# Patient Record
Sex: Female | Born: 1950
Health system: Southern US, Community
[De-identification: ages and names within clinical notes are randomized; demographics above are authoritative.]

## PROBLEM LIST (undated history)

## (undated) DIAGNOSIS — M1990 Unspecified osteoarthritis, unspecified site: Secondary | ICD-10-CM

## (undated) DIAGNOSIS — I10 Essential (primary) hypertension: Secondary | ICD-10-CM

## (undated) DIAGNOSIS — T7840XA Allergy, unspecified, initial encounter: Secondary | ICD-10-CM

## (undated) DIAGNOSIS — K219 Gastro-esophageal reflux disease without esophagitis: Secondary | ICD-10-CM

## (undated) DIAGNOSIS — G43909 Migraine, unspecified, not intractable, without status migrainosus: Secondary | ICD-10-CM

## (undated) DIAGNOSIS — E785 Hyperlipidemia, unspecified: Secondary | ICD-10-CM

## (undated) DIAGNOSIS — M545 Low back pain, unspecified: Secondary | ICD-10-CM

## (undated) DIAGNOSIS — E119 Type 2 diabetes mellitus without complications: Secondary | ICD-10-CM

## (undated) HISTORY — DX: Allergy, unspecified, initial encounter: T78.40XA

## (undated) HISTORY — DX: Type 2 diabetes mellitus without complications: E11.9

## (undated) HISTORY — DX: Essential (primary) hypertension: I10

## (undated) HISTORY — DX: Unspecified osteoarthritis, unspecified site: M19.90

## (undated) HISTORY — PX: NO PAST SURGERIES: SHX2092

## (undated) HISTORY — DX: Low back pain: M54.5

## (undated) HISTORY — DX: Migraine, unspecified, not intractable, without status migrainosus: G43.909

## (undated) HISTORY — DX: Low back pain, unspecified: M54.50

## (undated) HISTORY — DX: Hyperlipidemia, unspecified: E78.5

---

## 2009-05-26 LAB — HM MAMMOGRAPHY

## 2010-05-08 LAB — HM PAP SMEAR

## 2011-09-12 ENCOUNTER — Emergency Department: Payer: Self-pay | Admitting: *Deleted

## 2011-09-12 LAB — URINALYSIS, COMPLETE
Ketone: NEGATIVE
Nitrite: NEGATIVE
Protein: NEGATIVE
RBC,UR: 1 /HPF (ref 0–5)
WBC UR: 3 /HPF (ref 0–5)

## 2011-09-12 LAB — COMPREHENSIVE METABOLIC PANEL
Alkaline Phosphatase: 70 U/L (ref 50–136)
Anion Gap: 10 (ref 7–16)
BUN: 24 mg/dL — ABNORMAL HIGH (ref 7–18)
Bilirubin,Total: 0.4 mg/dL (ref 0.2–1.0)
Calcium, Total: 9.3 mg/dL (ref 8.5–10.1)
Chloride: 98 mmol/L (ref 98–107)
EGFR (African American): >60
EGFR (Non-African Amer.): >60
Osmolality: 282 (ref 275–301)
Potassium: 3.4 mmol/L — ABNORMAL LOW (ref 3.5–5.1)
SGOT(AST): 39 U/L — ABNORMAL HIGH (ref 15–37)
Sodium: 137 mmol/L (ref 136–145)
Total Protein: 7.9 g/dL (ref 6.4–8.2)

## 2011-09-12 LAB — CBC
HCT: 42.3 % (ref 35.0–47.0)
HGB: 14.9 g/dL (ref 12.0–16.0)
MCH: 30 pg (ref 26.0–34.0)
MCHC: 35.2 g/dL (ref 32.0–36.0)
RDW: 12.7 % (ref 11.5–14.5)

## 2011-09-12 LAB — LIPASE, BLOOD: Lipase: 286 U/L (ref 73–393)

## 2012-07-07 ENCOUNTER — Ambulatory Visit: Payer: Self-pay

## 2012-12-03 ENCOUNTER — Ambulatory Visit: Payer: Self-pay | Admitting: Family Medicine

## 2014-03-25 ENCOUNTER — Ambulatory Visit: Payer: Self-pay

## 2014-06-09 ENCOUNTER — Emergency Department
Admit: 2014-06-09 | Disposition: A | Discharge status: Home / Self Care | Payer: Self-pay | Admitting: Emergency Medicine

## 2014-06-09 LAB — CBC WITH DIFFERENTIAL/PLATELET
Basophil #: 0.1 10*3/uL (ref 0.0–0.1)
Basophil %: 0.6 %
EOS PCT: 1.1 %
Eosinophil #: 0.1 10*3/uL (ref 0.0–0.7)
HCT: 45.4 % (ref 35.0–47.0)
HGB: 15.2 g/dL (ref 12.0–16.0)
Lymphocyte #: 2.8 10*3/uL (ref 1.0–3.6)
Lymphocyte %: 28.3 %
MCH: 29.6 pg (ref 26.0–34.0)
MCHC: 33.6 g/dL (ref 32.0–36.0)
MCV: 88 fL (ref 80–100)
MONOS PCT: 5 %
Monocyte #: 0.5 x10 3/mm (ref 0.2–0.9)
Neutrophil #: 6.3 10*3/uL (ref 1.4–6.5)
Neutrophil %: 65 %
Platelet: 320 10*3/uL (ref 150–440)
RBC: 5.14 10*6/uL (ref 3.80–5.20)
RDW: 12.6 % (ref 11.5–14.5)
WBC: 9.8 10*3/uL (ref 3.6–11.0)

## 2014-06-09 LAB — LIPASE, BLOOD: Lipase: 35 U/L

## 2014-06-09 LAB — COMPREHENSIVE METABOLIC PANEL
ALK PHOS: 52 U/L
ALT: 103 U/L — AB
Albumin: 4.5 g/dL
Anion Gap: 9 (ref 7–16)
BILIRUBIN TOTAL: 0.6 mg/dL
BUN: 19 mg/dL
CO2: 32 mmol/L
Calcium, Total: 9.6 mg/dL
Chloride: 95 mmol/L — ABNORMAL LOW
Creatinine: 0.73 mg/dL
EGFR (African American): >60
EGFR (Non-African Amer.): >60
Glucose: 269 mg/dL — ABNORMAL HIGH
POTASSIUM: 3.3 mmol/L — AB
SGOT(AST): 105 U/L — ABNORMAL HIGH
Sodium: 136 mmol/L
TOTAL PROTEIN: 8.4 g/dL — AB

## 2014-06-09 LAB — TROPONIN I: Troponin-I: <0.03 ng/mL

## 2014-06-09 LAB — URINALYSIS, COMPLETE
BACTERIA: NONE SEEN
BILIRUBIN, UR: NEGATIVE
Blood: NEGATIVE
Ketone: NEGATIVE
NITRITE: NEGATIVE
Ph: 5 (ref 4.5–8.0)
Protein: NEGATIVE
Specific Gravity: 1.028 (ref 1.003–1.030)

## 2014-09-07 ENCOUNTER — Telehealth: Payer: Self-pay | Admitting: Unknown Physician Specialty

## 2014-09-07 MED ORDER — CANAGLIFLOZIN 100 MG PO TABS: 100.0000 mg | ORAL_TABLET | Freq: Every day | ORAL | Status: DC

## 2014-09-07 NOTE — Telephone Encounter (Signed)
Routing to provider. [facility] number is [phone] and [facility] [place].

## 2014-09-07 NOTE — Telephone Encounter (Signed)
Pt needs to be seen further refills

## 2014-09-07 NOTE — Telephone Encounter (Signed)
E-fax came through for refill: Rx: Invokana Copy in basket

## 2014-11-01 ENCOUNTER — Telehealth: Payer: Self-pay | Admitting: Unknown Physician Specialty

## 2014-11-01 DIAGNOSIS — M545 Low back pain, unspecified: Secondary | ICD-10-CM | POA: Insufficient documentation

## 2014-11-01 DIAGNOSIS — R748 Abnormal levels of other serum enzymes: Secondary | ICD-10-CM | POA: Insufficient documentation

## 2014-11-01 DIAGNOSIS — M1990 Unspecified osteoarthritis, unspecified site: Secondary | ICD-10-CM

## 2014-11-01 DIAGNOSIS — E1159 Type 2 diabetes mellitus with other circulatory complications: Secondary | ICD-10-CM | POA: Insufficient documentation

## 2014-11-01 DIAGNOSIS — E119 Type 2 diabetes mellitus without complications: Secondary | ICD-10-CM

## 2014-11-01 DIAGNOSIS — J309 Allergic rhinitis, unspecified: Secondary | ICD-10-CM

## 2014-11-01 DIAGNOSIS — E1165 Type 2 diabetes mellitus with hyperglycemia: Secondary | ICD-10-CM | POA: Insufficient documentation

## 2014-11-01 DIAGNOSIS — E1169 Type 2 diabetes mellitus with other specified complication: Secondary | ICD-10-CM | POA: Insufficient documentation

## 2014-11-01 DIAGNOSIS — E785 Hyperlipidemia, unspecified: Secondary | ICD-10-CM

## 2014-11-01 DIAGNOSIS — I10 Essential (primary) hypertension: Secondary | ICD-10-CM

## 2014-11-01 NOTE — Telephone Encounter (Signed)
I was looking back at practice partner and metformin is not on the patient's current medication list. It is actually on the allergy list with a high risk so I tried to call the patient to ask her about this. Her mother answered the phone and I asked her to have the patient give me a call back as soon as she could.

## 2014-11-01 NOTE — Telephone Encounter (Signed)
Pt called stated she can not take Invokana. Pt stated she has started back on Metformin and needs a RX for this medication to last until her appt on Tuesday with Malachy Mood. [facility] [place]. Thanks.

## 2014-11-02 NOTE — Telephone Encounter (Signed)
Patient returned call and I asked her about being allergic to the metformin. Patient stated she was never allergic to it and that Malachy Mood took her off of it and switched her to the [place]. Patient states that the invokana messes with her kidneys so she started taking what was left of the metformin she had. Patient is requesting just enough medicine to get her to her appointment with Malachy Mood on Tuesday. Practice partner number is [phone] and [facility] [place].

## 2014-11-03 NOTE — Telephone Encounter (Signed)
I reviewed Cheryl Mendez's note from 06/15/2014; noted abdominal upset, intolerance to metformin I am not comfortable prescribing this and recommend patient talk to Malachy Mood about this medicine or other options at her appointment on Tuesday No new prescriptions today

## 2014-11-03 NOTE — Telephone Encounter (Signed)
Patient returned our call regarding her Metformin request. Advised patient as noted below.  Patient states that she is currently Patient was advised  states that she only needs a few Metformin pills to carry her through until she has her appt with Malachy Mood on Tuesday, 9/17.

## 2014-11-04 LAB — HM DIABETES EYE EXAM

## 2014-11-04 NOTE — Telephone Encounter (Signed)
I asked Cheryl Mendez about this message and she stated that the patient is wanting to know if she will be okay to go without the Metformin for a few days until her appointment. Will she be okay to go without it for a few days?

## 2014-11-04 NOTE — Telephone Encounter (Signed)
Yes, that was my intention; no metformin until she sees Malachy Mood next week

## 2014-11-04 NOTE — Telephone Encounter (Signed)
Called and left patient a voicemail asking for her to return my call. 

## 2014-11-08 ENCOUNTER — Encounter: Payer: Self-pay | Admitting: Unknown Physician Specialty

## 2014-11-08 ENCOUNTER — Telehealth: Payer: Self-pay | Admitting: Unknown Physician Specialty

## 2014-11-08 ENCOUNTER — Ambulatory Visit (INDEPENDENT_AMBULATORY_CARE_PROVIDER_SITE_OTHER): Payer: BLUE CROSS/BLUE SHIELD | Admitting: Unknown Physician Specialty

## 2014-11-08 VITALS — BP 130/81 | HR 80 | Temp 98.6°F | Ht 64.6 in | Wt 185.4 lb

## 2014-11-08 DIAGNOSIS — R748 Abnormal levels of other serum enzymes: Secondary | ICD-10-CM | POA: Diagnosis not present

## 2014-11-08 DIAGNOSIS — E785 Hyperlipidemia, unspecified: Secondary | ICD-10-CM

## 2014-11-08 DIAGNOSIS — I10 Essential (primary) hypertension: Secondary | ICD-10-CM

## 2014-11-08 DIAGNOSIS — E119 Type 2 diabetes mellitus without complications: Secondary | ICD-10-CM | POA: Diagnosis not present

## 2014-11-08 LAB — LIPID PANEL PICCOLO, WAIVED
CHOLESTEROL PICCOLO, WAIVED: 241 mg/dL — AB (ref <200)
Chol/HDL Ratio Piccolo,Waive: 5.7 mg/dL — ABNORMAL HIGH
HDL CHOL PICCOLO, WAIVED: 42 mg/dL — AB (ref >59)
LDL Chol Calc Piccolo Waived: 145 mg/dL — ABNORMAL HIGH (ref <100)
Triglycerides Piccolo,Waived: 269 mg/dL — ABNORMAL HIGH (ref <150)
VLDL Chol Calc Piccolo,Waive: 54 mg/dL — ABNORMAL HIGH (ref <30)

## 2014-11-08 LAB — MICROALBUMIN, URINE WAIVED
CREATININE, URINE WAIVED: 100 mg/dL (ref 10–300)
MICROALB, UR WAIVED: 10 mg/L (ref 0–19)
Microalb/Creat Ratio: <30 mg/g (ref <30)

## 2014-11-08 LAB — BAYER DCA HB A1C WAIVED: HB A1C (BAYER DCA - WAIVED): 8.2 % — ABNORMAL HIGH (ref <7.0)

## 2014-11-08 MED ORDER — BENAZEPRIL-HYDROCHLOROTHIAZIDE 20-25 MG PO TABS
1.0000 | ORAL_TABLET | Freq: Every day | ORAL | Status: DC
Start: 2014-11-08 — End: 2014-12-30

## 2014-11-08 MED ORDER — GLUCOSE BLOOD VI STRP: ORAL_STRIP | Status: DC

## 2014-11-08 MED ORDER — METFORMIN HCL 850 MG PO TABS: 850.0000 mg | ORAL_TABLET | Freq: Two times a day (BID) | ORAL | Status: DC

## 2014-11-08 NOTE — Assessment & Plan Note (Addendum)
A1C is 8.2 today. Significant improvement from 10.2 seven months ago. Currently taking metformin 500mg  and will increase to 850mg  twice daily

## 2014-11-08 NOTE — Assessment & Plan Note (Signed)
Well controlled on Lotensin.

## 2014-11-08 NOTE — Progress Notes (Signed)
BP 130/81 mmHg  Pulse 80  Temp(Src) 98.6 F (37 C)  Ht 5' 4.6" (1.641 m)  Wt 185 lb 6.4 oz (84.097 kg)  BMI 31.23 kg/m2  SpO2 97%  LMP  (LMP Unknown)   Subjective:    Patient ID: Cheryl Mendez, female    DOB: Jul 04, 1950, 64 y.o.   MRN: [id number]  HPI: Cheryl Mendez is a 64 y.o. female  Chief Complaint  Patient presents with  . Medication Problem    pt states she was put on Invokana and could not take it. States she started taking metformin that she had left over (pt states metofrmin used to make her stomach upset but states it has not since she recently started taking it again)   Diabetes Mellitus:  She does not check her blood glucose at home. She has been eating better and taking her metformin. She stopped invokana because she could not tolerate it and switched back to metformin without any side effects or allergic reactions.   Hyperlipidemia: She refuses any medications for elevated cholesterol. She agrees to increase her physical activity and eat healthy once she retires on October 1. She has access to a nutritionist at work but has not taken advantage of this, encouraged her to do so before retirement.  Hypertension: Does not monitor blood pressure at home. Compliant with Lotensin. Stable in office today.  Relevant past medical, surgical, family and social history reviewed and updated as indicated. Interim medical history since our last visit reviewed. Allergies and medications reviewed and updated.  Review of Systems  Constitutional: Negative.  Negative for fever, chills, activity change and appetite change.  HENT: Negative.  Negative for congestion, rhinorrhea, sinus pressure, sneezing and sore throat.   Eyes: Negative.  Negative for discharge, redness and itching.  Respiratory: Negative.  Negative for cough, chest tightness, shortness of breath, wheezing and stridor.   Cardiovascular: Negative.  Negative for chest pain, palpitations and leg swelling.   Musculoskeletal: Negative.  Negative for myalgias, back pain, joint swelling, arthralgias and gait problem.  Skin: Negative.  Negative for color change, pallor, rash and wound.  Neurological: Negative.  Negative for dizziness, weakness, light-headedness and numbness.  Psychiatric/Behavioral: Negative.  Negative for confusion, sleep disturbance, self-injury and agitation. The patient is not nervous/anxious.     Per HPI unless specifically indicated above     Objective:    BP 130/81 mmHg  Pulse 80  Temp(Src) 98.6 F (37 C)  Ht 5' 4.6" (1.641 m)  Wt 185 lb 6.4 oz (84.097 kg)  BMI 31.23 kg/m2  SpO2 97%  LMP  (LMP Unknown)  Wt Readings from Last 3 Encounters:  11/08/14 185 lb 6.4 oz (84.097 kg)  11/01/14 185 lb (83.915 kg)    Physical Exam  Constitutional: She is oriented to person, place, and time. She appears well-developed and well-nourished. No distress.  HENT:  Head: Normocephalic and atraumatic.  Neck: Normal range of motion.  Cardiovascular: Normal rate, regular rhythm and normal heart sounds.   Pulmonary/Chest: Effort normal and breath sounds normal. No respiratory distress. She has no wheezes. She has no rales. She exhibits no tenderness.  Musculoskeletal: Normal range of motion.  Neurological: She is alert and oriented to person, place, and time.  Skin: Skin is warm and dry. No rash noted. She is not diaphoretic. No erythema. No pallor.  Psychiatric: She has a normal mood and affect. Her behavior is normal. Judgment and thought content normal.        Assessment &  Plan:   Problem List Items Addressed This Visit      Unprioritized   Hyperlipidemia - Primary    Refusing cholesterol medications.  Will work on diet and exercise after she retires.        Relevant Medications   benazepril-hydrochlorthiazide (LOTENSIN HCT) 20-25 MG per tablet   Other Relevant Orders   Lipid Panel Piccolo, Waived   Diabetes    A1C is 8.2 today. Significant improvement from 10.2  seven months ago. Currently taking metformin 500mg  and will increase to 850mg  twice daily      Relevant Medications   benazepril-hydrochlorthiazide (LOTENSIN HCT) 20-25 MG per tablet   metFORMIN (GLUCOPHAGE) 850 MG tablet   Other Relevant Orders   Comprehensive metabolic panel   Microalbumin, Urine Waived   Bayer DCA Hb A1c Waived   Hypertension    Well controlled on Lotensin.      Relevant Medications   benazepril-hydrochlorthiazide (LOTENSIN HCT) 20-25 MG per tablet   Other Relevant Orders   Comprehensive metabolic panel   Uric acid   Elevated liver enzymes    Awaiting lab results          Follow up plan: Return in about 3 months (around 02/07/2015).

## 2014-11-08 NOTE — Assessment & Plan Note (Signed)
Awaiting lab results

## 2014-11-08 NOTE — Telephone Encounter (Signed)
[facility] [place] called needs clarification on meds that were sent in. Please call him ASAP. Thanks.

## 2014-11-08 NOTE — Telephone Encounter (Signed)
Called and spoke to [facility] [place]. Ro states they need to know how often the patient is testing their blood sugar in order to fill the test strips.

## 2014-11-08 NOTE — Assessment & Plan Note (Addendum)
Refusing cholesterol medications.  Will work on diet and exercise after she retires.

## 2014-11-09 LAB — COMPREHENSIVE METABOLIC PANEL
A/G RATIO: 1.7 (ref 1.1–2.5)
ALBUMIN: 4.2 g/dL (ref 3.6–4.8)
ALK PHOS: 48 IU/L (ref 39–117)
ALT: 32 IU/L (ref 0–32)
AST: 26 IU/L (ref 0–40)
BILIRUBIN TOTAL: 0.5 mg/dL (ref 0.0–1.2)
BUN / CREAT RATIO: 25 (ref 11–26)
BUN: 13 mg/dL (ref 8–27)
CHLORIDE: 96 mmol/L — AB (ref 97–108)
CO2: 27 mmol/L (ref 18–29)
Calcium: 9.5 mg/dL (ref 8.7–10.3)
Creatinine, Ser: 0.53 mg/dL — ABNORMAL LOW (ref 0.57–1.00)
GFR calc non Af Amer: 101 mL/min/{1.73_m2} (ref >59)
GFR, EST AFRICAN AMERICAN: 116 mL/min/{1.73_m2} (ref >59)
GLOBULIN, TOTAL: 2.5 g/dL (ref 1.5–4.5)
GLUCOSE: 161 mg/dL — AB (ref 65–99)
Potassium: 4.2 mmol/L (ref 3.5–5.2)
SODIUM: 139 mmol/L (ref 134–144)
Total Protein: 6.7 g/dL (ref 6.0–8.5)

## 2014-11-09 LAB — URIC ACID: Uric Acid: 5.3 mg/dL (ref 2.5–7.1)

## 2014-11-09 NOTE — Telephone Encounter (Signed)
Called and let Ro know that the patient is only testing once a day.

## 2014-11-09 NOTE — Telephone Encounter (Signed)
Testing once a day

## 2014-12-29 ENCOUNTER — Other Ambulatory Visit: Payer: Self-pay | Admitting: Unknown Physician Specialty

## 2014-12-29 NOTE — Telephone Encounter (Signed)
Called patient and got no answer. Left voicemail to call us back.

## 2014-12-29 NOTE — Telephone Encounter (Signed)
Pt came in stated she ran out of her Benazepril. Stated her insurance has gone out and will take 5 days to reinstate. Please call pt ASAP. Thanks.

## 2014-12-30 MED ORDER — METFORMIN HCL 850 MG PO TABS: 850.0000 mg | ORAL_TABLET | Freq: Two times a day (BID) | ORAL | Status: DC

## 2014-12-30 MED ORDER — BENAZEPRIL-HYDROCHLOROTHIAZIDE 20-25 MG PO TABS: 1.0000 | ORAL_TABLET | Freq: Every day | ORAL | Status: DC

## 2014-12-30 NOTE — Telephone Encounter (Signed)
Called and spoke to patient's mother who stated the patient was not at home right now. I asked for the patient to please return my call so I could let her know that her prescriptions were sent to the pharmacy for the benazepril and metformin.

## 2014-12-30 NOTE — Telephone Encounter (Signed)
If the orders are in the chart, feel free to give the verbal order.

## 2014-12-30 NOTE — Telephone Encounter (Signed)
Patient called because she hasn't received her medications. She's been to her pharmacy and they say they have nothing on file. It's in the system that the [facility] was [facility], but it's not in their system? Patient explained she talked to you about her insurance switching over and you gave her enough to get her through the rest of this year till her insurance is confirmed.   She is completely out of benazepril. And hasn't taken any since Wednesday.  She has a few pills left of metformin.  LAST VISIT: 11/08/2014

## 2015-01-02 NOTE — Telephone Encounter (Signed)
Called and let patient know prescriptions were sent to pharmacy.

## 2015-01-06 ENCOUNTER — Telehealth: Payer: Self-pay

## 2015-01-06 MED ORDER — GLUCOSE BLOOD VI STRP: ORAL_STRIP | Status: DC

## 2015-01-06 NOTE — Telephone Encounter (Signed)
[facility] pharmacy called and stated that the patient is having her test strips filled with [facility] instead of [facility]. They need to know how often the patient is testing per day in order to fill the test strips. I looked in last note but did not see anything.

## 2015-01-06 NOTE — Telephone Encounter (Signed)
Pharmacy Notified

## 2015-01-06 NOTE — Telephone Encounter (Signed)
[facility] called back. They need clarification on the test strips and how often she uses them. The reason is when they bill insurance, they have to have the instructions. (Instructions say Use as Directed).  They're billing soon, so they would like to know as soon as possible.

## 2015-01-06 NOTE — Telephone Encounter (Signed)
daily

## 2015-04-07 ENCOUNTER — Encounter: Payer: Self-pay | Admitting: Unknown Physician Specialty

## 2015-04-07 ENCOUNTER — Ambulatory Visit (INDEPENDENT_AMBULATORY_CARE_PROVIDER_SITE_OTHER): Payer: No Typology Code available for payment source | Admitting: Unknown Physician Specialty

## 2015-04-07 ENCOUNTER — Other Ambulatory Visit: Payer: Self-pay | Admitting: Unknown Physician Specialty

## 2015-04-07 VITALS — BP 115/81 | HR 105 | Temp 98.4°F | Ht 64.5 in | Wt 181.4 lb

## 2015-04-07 DIAGNOSIS — R059 Cough, unspecified: Secondary | ICD-10-CM

## 2015-04-07 DIAGNOSIS — R05 Cough: Secondary | ICD-10-CM | POA: Diagnosis not present

## 2015-04-07 DIAGNOSIS — J069 Acute upper respiratory infection, unspecified: Secondary | ICD-10-CM

## 2015-04-07 DIAGNOSIS — R6883 Chills (without fever): Secondary | ICD-10-CM | POA: Diagnosis not present

## 2015-04-07 DIAGNOSIS — J0100 Acute maxillary sinusitis, unspecified: Secondary | ICD-10-CM | POA: Diagnosis not present

## 2015-04-07 LAB — INFLUENZA A AND B
Influenza A Ag, EIA: NEGATIVE
Influenza B Ag, EIA: NEGATIVE

## 2015-04-07 LAB — PLEASE NOTE:

## 2015-04-07 MED ORDER — AZITHROMYCIN 250 MG PO TABS: ORAL_TABLET | ORAL | Status: DC

## 2015-04-07 MED ORDER — PREDNISONE 20 MG PO TABS: 20.0000 mg | ORAL_TABLET | Freq: Every day | ORAL | Status: DC

## 2015-04-07 MED ORDER — ALBUTEROL SULFATE HFA 108 (90 BASE) MCG/ACT IN AERS: 2.0000 | INHALATION_SPRAY | Freq: Four times a day (QID) | RESPIRATORY_TRACT | Status: DC | PRN

## 2015-04-07 NOTE — Progress Notes (Signed)
BP 115/81 mmHg  Pulse 105  Temp(Src) 98.4 F (36.9 C)  Ht 5' 4.5" (1.638 m)  Wt 181 lb 6.4 oz (82.283 kg)  BMI 30.67 kg/m2  SpO2 95%  LMP  (LMP Unknown)   Subjective:    Patient ID: Cheryl Mendez, female    DOB: 20-Dec-1950, 65 y.o.   MRN: [id number]  HPI: Cheryl Mendez is a 65 y.o. female  Chief Complaint  Patient presents with  . URI    pt states she has had a fever, cough (productive), chills, and congestion. States she has tried OTC medications.   URI  This is a new problem. The current episode started in the past 7 days. The problem has been gradually worsening. Maximum temperature: Had a fever but doesn't know what it was. Associated symptoms include chest pain, congestion, coughing, rhinorrhea and sneezing. Pertinent negatives include no abdominal pain. Associated symptoms comments: Noted chills. She has tried decongestant and antihistamine for the symptoms. The treatment provided mild relief.     Relevant past medical, surgical, family and social history reviewed and updated as indicated. Interim medical history since our last visit reviewed. Allergies and medications reviewed and updated.  Review of Systems  HENT: Positive for congestion, rhinorrhea and sneezing.   Respiratory: Positive for cough.   Cardiovascular: Positive for chest pain.  Gastrointestinal: Negative for abdominal pain.    Per HPI unless specifically indicated above     Objective:    BP 115/81 mmHg  Pulse 105  Temp(Src) 98.4 F (36.9 C)  Ht 5' 4.5" (1.638 m)  Wt 181 lb 6.4 oz (82.283 kg)  BMI 30.67 kg/m2  SpO2 95%  LMP  (LMP Unknown)  Wt Readings from Last 3 Encounters:  04/07/15 181 lb 6.4 oz (82.283 kg)  11/08/14 185 lb 6.4 oz (84.097 kg)  11/01/14 185 lb (83.915 kg)    Physical Exam  Constitutional: She is oriented to person, place, and time. She appears well-developed and well-nourished. No distress.  HENT:  Head: Normocephalic and atraumatic.  Right Ear: Tympanic membrane  and ear canal normal.  Left Ear: Tympanic membrane and ear canal normal.  Nose: Rhinorrhea present. Right sinus exhibits no maxillary sinus tenderness and no frontal sinus tenderness. Left sinus exhibits no maxillary sinus tenderness and no frontal sinus tenderness.  Mouth/Throat: Mucous membranes are normal. Posterior oropharyngeal erythema present.  Eyes: Conjunctivae and lids are normal. Right eye exhibits no discharge. Left eye exhibits no discharge. No scleral icterus.  Cardiovascular: Normal rate and regular rhythm.   Pulmonary/Chest: Effort normal and breath sounds normal. No respiratory distress.  Abdominal: Normal appearance. There is no splenomegaly or hepatomegaly.  Musculoskeletal: Normal range of motion.  Neurological: She is alert and oriented to person, place, and time.  Skin: Skin is intact. No rash noted. No pallor.  Psychiatric: She has a normal mood and affect. Her behavior is normal. Judgment and thought content normal.   Flu test is negative   Assessment & Plan:   Problem List Items Addressed This Visit    None    Visit Diagnoses    Chills    -  Primary    Relevant Orders    Influenza a and b    Acute maxillary sinusitis, recurrence not specified        Relevant Medications    azithromycin (ZITHROMAX Z-PAK) 250 MG tablet    predniSONE (DELTASONE) 20 MG tablet    Cough        Upper respiratory infection  Relevant Medications    azithromycin (ZITHROMAX Z-PAK) 250 MG tablet       Will treat with Z pack and Prednisone at 20 mg daily for 5 days.  Inhaler for wheezing.  Discuss with the pharmacist how to use it.    Follow up plan: Return if symptoms worsen or fail to improve, for physical.

## 2015-06-05 ENCOUNTER — Encounter: Payer: No Typology Code available for payment source | Admitting: Unknown Physician Specialty

## 2015-06-26 ENCOUNTER — Ambulatory Visit (INDEPENDENT_AMBULATORY_CARE_PROVIDER_SITE_OTHER): Payer: Medicare Other | Admitting: Unknown Physician Specialty

## 2015-06-26 ENCOUNTER — Ambulatory Visit: Admission: RE | Admit: 2015-06-26 | Payer: Medicare Other | Source: Ambulatory Visit | Admitting: *Deleted

## 2015-06-26 ENCOUNTER — Ambulatory Visit
Admission: RE | Admit: 2015-06-26 | Discharge: 2015-06-26 | Disposition: A | Discharge status: Home / Self Care | Payer: Medicare Other | Source: Ambulatory Visit | Attending: Unknown Physician Specialty | Admitting: Unknown Physician Specialty

## 2015-06-26 ENCOUNTER — Encounter: Payer: Self-pay | Admitting: Unknown Physician Specialty

## 2015-06-26 VITALS — BP 124/83 | HR 106 | Temp 97.7°F | Ht 64.2 in | Wt 183.4 lb

## 2015-06-26 DIAGNOSIS — M7581 Other shoulder lesions, right shoulder: Secondary | ICD-10-CM | POA: Diagnosis not present

## 2015-06-26 DIAGNOSIS — R300 Dysuria: Secondary | ICD-10-CM | POA: Diagnosis not present

## 2015-06-26 DIAGNOSIS — M25511 Pain in right shoulder: Secondary | ICD-10-CM | POA: Diagnosis not present

## 2015-06-26 DIAGNOSIS — R319 Hematuria, unspecified: Secondary | ICD-10-CM

## 2015-06-26 DIAGNOSIS — N3090 Cystitis, unspecified without hematuria: Secondary | ICD-10-CM

## 2015-06-26 MED ORDER — MELOXICAM 15 MG PO TABS: 15.0000 mg | ORAL_TABLET | Freq: Every day | ORAL | Status: DC

## 2015-06-26 MED ORDER — NITROFURANTOIN MONOHYD MACRO 100 MG PO CAPS: 100.0000 mg | ORAL_CAPSULE | Freq: Two times a day (BID) | ORAL | Status: DC

## 2015-06-26 NOTE — Patient Instructions (Signed)
Rotator Cuff Injury Rotator cuff injury is any type of injury to the set of muscles and tendons that make up the stabilizing unit of your shoulder. This unit holds the ball of your upper arm bone (humerus) in the socket of your shoulder blade (scapula).  CAUSES Injuries to your rotator cuff most commonly come from sports or activities that cause your arm to be moved repeatedly over your head. Examples of this include throwing, weight lifting, swimming, or racquet sports. Long lasting (chronic) irritation of your rotator cuff can cause soreness and swelling (inflammation), bursitis, and eventual damage to your tendons, such as a tear (rupture). SIGNS AND SYMPTOMS Acute rotator cuff tear: 1. Sudden tearing sensation followed by severe pain shooting from your upper shoulder down your arm toward your elbow. 2. Decreased range of motion of your shoulder because of pain and muscle spasm. 3. Severe pain. 4. Inability to raise your arm out to the side because of pain and loss of muscle power (large tears). Chronic rotator cuff tear: 1. Pain that usually is worse at night and may interfere with sleep. 2. Gradual weakness and decreased shoulder motion as the pain worsens. 3. Decreased range of motion. Rotator cuff tendinitis: 1. Deep ache in your shoulder and the outside upper arm over your shoulder. 2. Pain that comes on gradually and becomes worse when lifting your arm to the side or turning it inward. DIAGNOSIS Rotator cuff injury is diagnosed through a medical history, physical exam, and imaging exam. The medical history helps determine the type of rotator cuff injury. Your health care provider will look at your injured shoulder, feel the injured area, and ask you to move your shoulder in different positions. X-ray exams typically are done to rule out other causes of shoulder pain, such as fractures. MRI is the exam of choice for the most severe shoulder injuries because the images show muscles and  tendons.  TREATMENT  Chronic tear: 1. Medicine for pain, such as acetaminophen or ibuprofen. 2. Physical therapy and range-of-motion exercises may be helpful in maintaining shoulder function and strength. 3. Steroid injections into your shoulder joint. 4. Surgical repair of the rotator cuff if the injury does not heal with noninvasive treatment. Acute tear: 1. Anti-inflammatory medicines such as ibuprofen and naproxen to help reduce pain and swelling. 2. A sling to help support your arm and rest your rotator cuff muscles. Long-term use of a sling is not advised. It may cause significant stiffening of the shoulder joint. 3. Surgery may be considered within a few weeks, especially in younger, active people, to return the shoulder to full function. 4. Indications for surgical treatment include the following: 1. Age younger than 89 years. 2. Rotator cuff tears that are complete. 3. Physical therapy, rest, and anti-inflammatory medicines have been used for 6-8 weeks, with no improvement. 4. Employment or sporting activity that requires constant shoulder use. Tendinitis: 1. Anti-inflammatory medicines such as ibuprofen and naproxen to help reduce pain and swelling. 2. A sling to help support your arm and rest your rotator cuff muscles. Long-term use of a sling is not advised. It may cause significant stiffening of the shoulder joint. 3. Severe tendinitis may require: 1. Steroid injections into your shoulder joint. 2. Physical therapy. 3. Surgery. HOME CARE INSTRUCTIONS  1. Apply ice to your injury: 1. Put ice in a plastic bag. 2. Place a towel between your skin and the bag. 3. Leave the ice on for 20 minutes, 2-3 times a day. 2. If you have  a shoulder immobilizer (sling and straps), wear it until told otherwise by your health care provider. 3. You may want to sleep on several pillows or in a recliner at night to lessen swelling and pain. 4. Only take over-the-counter or prescription medicines  for pain, discomfort, or fever as directed by your health care provider. 5. Do simple hand squeezing exercises with a soft rubber ball to decrease hand swelling. SEEK MEDICAL CARE IF:   Your shoulder pain increases, or new pain or numbness develops in your arm, hand, or fingers.  Your hand or fingers are colder than your other hand. SEEK IMMEDIATE MEDICAL CARE IF:   Your arm, hand, or fingers are numb or tingling.  Your arm, hand, or fingers are increasingly swollen and painful, or they turn white or blue. MAKE SURE YOU:  Understand these instructions.  Will watch your condition.  Will get help right away if you are not doing well or get worse.   This information is not intended to replace advice given to you by your health care provider. Make sure you discuss any questions you have with your health care provider.   Document Released: 02/02/2000 Document Revised: 02/09/2013 Document Reviewed: 09/16/2012 Elsevier Interactive Patient Education 2016 [employer] Shoulder Range of Motion Exercises Shoulder range of motion (ROM) exercises are designed to keep the shoulder moving freely. They are often recommended for people who have shoulder pain. MOVEMENT EXERCISE When you are able, do this exercise 5-6 days per week, or as told by your health care provider. Work toward doing 2 sets of 10 swings. Pendulum Exercise How To Do This Exercise Lying Down 5. Lie face-down on a bed with your abdomen close to the side of the bed. 6. Let your arm hang over the side of the bed. 7. Relax your shoulder, arm, and hand. 8. Slowly and gently swing your arm forward and back. Do not use your neck muscles to swing your arm. They should be relaxed. If you are struggling to swing your arm, have someone gently swing it for you. When you do this exercise for the first time, swing your arm at a 15 degree angle for 15 seconds, or swing your arm 10 times. As pain lessens over time, increase the angle of the  swing to 30-45 degrees. 9. Repeat steps 1-4 with the other arm. How To Do This Exercise While Standing 4. Stand next to a sturdy chair or table and hold on to it with your hand.  Bend forward at the waist.  Bend your knees slightly.  Relax your other arm and let it hang limp.  Relax the shoulder blade of the arm that is hanging and let it drop.  While keeping your shoulder relaxed, use body motion to swing your arm in small circles. The first time you do this exercise, swing your arm for about 30 seconds or 10 times. When you do it next time, swing your arm for a little longer.  Stand up tall and relax.  Repeat steps 1-7, this time changing the direction of the circles. 5. Repeat steps 1-8 with the other arm. STRETCHING EXERCISES Do these exercises 3-4 times per day on 5-6 days per week or as told by your health care provider. Work toward holding the stretch for 20 seconds. Stretching Exercise 1 3. Lift your arm straight out in front of you. 4. Bend your arm 90 degrees at the elbow (right angle) so your forearm goes across your body and looks like the letter "  L." 5. Use your other arm to gently pull the elbow forward and across your body. 6. Repeat steps 1-3 with the other arm. Stretching Exercise 2 You will need a towel or rope for this exercise. 5. Bend one arm behind your back with the palm facing outward. 6. Hold a towel with your other hand. 7. Reach the arm that holds the towel above your head, and bend that arm at the elbow. Your wrist should be behind your neck. 8. Use your free hand to grab the free end of the towel. 9. With the higher hand, gently pull the towel up behind you. 10. With the lower hand, pull the towel down behind you. 11. Repeat steps 1-6 with the other arm. STRENGTHENING EXERCISES Do each of these exercises at four different times of day (sessions) every day or as told by your health care provider. To begin with, repeat each exercise 5 times  (repetitions). Work toward doing 3 sets of 12 repetitions or as told by your health care provider. Strengthening Exercise 1 You will need a light weight for this activity. As you grow stronger, you may use a heavier weight. 5. Standing with a weight in your hand, lift your arm straight out to the side until it is at the same height as your shoulder. 6. Bend your arm at 90 degrees so that your fingers are pointing to the ceiling. 7. Slowly raise your hand until your arm is straight up in the air. 8. Repeat steps 1-3 with the other arm. Strengthening Exercise 2 You will need a light weight for this activity. As you grow stronger, you may use a heavier weight. 4. Standing with a weight in your hand, gradually move your straight arm in an arc, starting at your side, then out in front of you, then straight up over your head. 5. Gradually move your other arm in an arc, starting at your side, then out in front of you, then straight up over your head. 6. Repeat steps 1-2 with the other arm. Strengthening Exercise 3 You will need an elastic band for this activity. As you grow stronger, gradually increase the size of the bands or increase the number of bands that you use at one time. 6. While standing, hold an elastic band in one hand and raise that arm up in the air. 7. With your other hand, pull down the band until that hand is by your side. 8. Repeat steps 1-2 with the other arm.   This information is not intended to replace advice given to you by your health care provider. Make sure you discuss any questions you have with your health care provider.   Document Released: 11/03/2002 Document Revised: 06/21/2014 Document Reviewed: 01/31/2014 Elsevier Interactive Patient Education [employer].

## 2015-06-26 NOTE — Progress Notes (Signed)
BP 124/83 mmHg  Pulse 106  Temp(Src) 97.7 F (36.5 C)  Ht 5' 4.2" (1.631 m)  Wt 183 lb 6.4 oz (83.19 kg)  BMI 31.27 kg/m2  SpO2 95%  LMP  (LMP Unknown)   Subjective:    Patient ID: Cheryl Mendez, female    DOB: [birth year], 65 y.o.   MRN: [id number]  HPI: Cheryl Mendez is a 65 y.o. female  Chief Complaint  Patient presents with  . Shoulder Pain    pt states her right shoulder has been bothering her for a few months now  . Urinary Tract Infection    pt states she has been having some burning when urination for about a week now   Shoulder Pain  The pain is present in the right shoulder. This is a new (started following house cleaning.  Also caring for mother) problem. Episode onset: 1 week. The problem occurs constantly. The quality of the pain is described as aching. The pain is severe (worse at night). Associated symptoms include a limited range of motion. Pertinent negatives include no fever, inability to bear weight, itching, joint locking, joint swelling, numbness, stiffness or tingling. The symptoms are aggravated by lying down. Treatments tried: Exedrin. The treatment provided moderate relief.  Urinary Tract Infection  This is a new problem. The current episode started in the past 7 days. The quality of the pain is described as burning. Associated symptoms include frequency, hematuria and urgency. Pertinent negatives include no chills, discharge, flank pain, hesitancy, nausea, possible pregnancy, sweats or vomiting. She has tried nothing for the symptoms.    Relevant past medical, surgical, family and social history reviewed and updated as indicated. Interim medical history since our last visit reviewed. Allergies and medications reviewed and updated.  Review of Systems  Constitutional: Negative for fever and chills.  Gastrointestinal: Negative for nausea and vomiting.  Genitourinary: Positive for urgency, frequency and hematuria. Negative for hesitancy and flank pain.   Musculoskeletal: Negative for stiffness.  Skin: Negative for itching.  Neurological: Negative for tingling and numbness.    Per HPI unless specifically indicated above     Objective:    BP 124/83 mmHg  Pulse 106  Temp(Src) 97.7 F (36.5 C)  Ht 5' 4.2" (1.631 m)  Wt 183 lb 6.4 oz (83.19 kg)  BMI 31.27 kg/m2  SpO2 95%  LMP  (LMP Unknown)  Wt Readings from Last 3 Encounters:  06/26/15 183 lb 6.4 oz (83.19 kg)  04/07/15 181 lb 6.4 oz (82.283 kg)  11/08/14 185 lb 6.4 oz (84.097 kg)    Physical Exam  Constitutional: She is oriented to person, place, and time. She appears well-developed and well-nourished. No distress.  HENT:  Head: Normocephalic and atraumatic.  Eyes: Conjunctivae and lids are normal. Right eye exhibits no discharge. Left eye exhibits no discharge. No scleral icterus.  Cardiovascular: Normal rate.   Pulmonary/Chest: Effort normal.  Abdominal: Normal appearance. There is no splenomegaly or hepatomegaly.  Musculoskeletal:       Right shoulder: She exhibits decreased range of motion, tenderness, pain and decreased strength. She exhibits no bony tenderness, no swelling, no effusion, no crepitus, no deformity, no laceration, no spasm and normal pulse.  Neurological: She is alert and oriented to person, place, and time.  Skin: Skin is intact. No rash noted. No pallor.  Psychiatric: She has a normal mood and affect. Her behavior is normal. Judgment and thought content normal.      Assessment & Plan:   Problem List Items  Addressed This Visit    None    Visit Diagnoses    Burning with urination    -  Primary    Relevant Orders    UA/M w/rflx Culture, Routine    Cystitis        Hematuria        Treat for UTI and f/u in 2 weeks    Right rotator cuff tendinitis        Meloxicam.  ROM exercises given.  Consider cortisone and PT.  Get x-ray    Relevant Medications    meloxicam (MOBIC) 15 MG tablet    Other Relevant Orders    DG Shoulder Right        Follow  up plan: Return in about 2 weeks (around 07/10/2015) for will need urine at that time.  Marland Kitchen

## 2015-06-28 LAB — UA/M W/RFLX CULTURE, ROUTINE
Bilirubin, UA: NEGATIVE
GLUCOSE, UA: NEGATIVE
Ketones, UA: NEGATIVE
NITRITE UA: NEGATIVE
UUROB: 0.2 mg/dL (ref 0.2–1.0)
pH, UA: 5 (ref 5.0–7.5)

## 2015-06-28 LAB — MICROSCOPIC EXAMINATION: RBC, UA: >30 /hpf — AB (ref 0–?)

## 2015-06-28 LAB — URINE CULTURE, REFLEX

## 2015-07-09 ENCOUNTER — Other Ambulatory Visit: Payer: Self-pay | Admitting: Unknown Physician Specialty

## 2015-07-10 ENCOUNTER — Encounter: Payer: Self-pay | Admitting: Unknown Physician Specialty

## 2015-07-10 ENCOUNTER — Ambulatory Visit (INDEPENDENT_AMBULATORY_CARE_PROVIDER_SITE_OTHER): Payer: Medicare Other | Admitting: Unknown Physician Specialty

## 2015-07-10 VITALS — BP 154/86 | HR 86 | Temp 98.3°F | Ht 64.6 in | Wt 185.4 lb

## 2015-07-10 DIAGNOSIS — I10 Essential (primary) hypertension: Secondary | ICD-10-CM

## 2015-07-10 DIAGNOSIS — E119 Type 2 diabetes mellitus without complications: Secondary | ICD-10-CM

## 2015-07-10 DIAGNOSIS — E785 Hyperlipidemia, unspecified: Secondary | ICD-10-CM

## 2015-07-10 DIAGNOSIS — R319 Hematuria, unspecified: Secondary | ICD-10-CM | POA: Diagnosis not present

## 2015-07-10 DIAGNOSIS — M7521 Bicipital tendinitis, right shoulder: Secondary | ICD-10-CM | POA: Insufficient documentation

## 2015-07-10 MED ORDER — GLIPIZIDE 5 MG PO TABS: 5.0000 mg | ORAL_TABLET | Freq: Two times a day (BID) | ORAL | Status: DC

## 2015-07-10 MED ORDER — METFORMIN HCL 1000 MG PO TABS: 1000.0000 mg | ORAL_TABLET | Freq: Two times a day (BID) | ORAL | Status: DC

## 2015-07-10 NOTE — Assessment & Plan Note (Signed)
Refuses statins

## 2015-07-10 NOTE — Assessment & Plan Note (Addendum)
Hgb A1C is 9.7.  Increase Metformin to 2 Grams/day.  Start Glipizide.  Consider GLP1 next visit

## 2015-07-10 NOTE — Assessment & Plan Note (Signed)
Won't be any help with steroid injection here.  Will refer to Orthopedics for further management.  X-ray shows OA as well

## 2015-07-10 NOTE — Progress Notes (Signed)
BP 154/86 mmHg  Pulse 86  Temp(Src) 98.3 F (36.8 C)  Ht 5' 4.6" (1.641 m)  Wt 185 lb 6.4 oz (84.097 kg)  BMI 31.23 kg/m2  SpO2 96%  LMP  (LMP Unknown)   Subjective:    Patient ID: Cheryl Mendez, female    DOB: [birth year], 65 y.o.   MRN: [id number]  HPI: Cheryl Mendez is a 65 y.o. female  Chief Complaint  Patient presents with  . Follow-up    2 week f/u from hematuria- urine ordered and shoulder pain   Shoulder pain F/u from shoulder pain  She is trying to do some exercises.  It is not doing a lot better.  Did go on vacation and went swimming   Hematuria Symptoms improved.  Needs a repeat urine  Lost to f/u:  Diabetes: Using medications without difficulties No hypoglycemic episodes No hyperglycemic episodes Feet problems:none Blood Sugars averaging:130 eye exam within last year yes Last Hgb A1C: 8.2  Hypertension  Using medications without difficulty Average home BPs   Using medication without problems or lightheadedness No chest pain with exertion or shortness of breath No Edema  Elevated Cholesterol Using medications without problems No Muscle aches  Diet: no change Exercise: no change   Relevant past medical, surgical, family and social history reviewed and updated as indicated. Interim medical history since our last visit reviewed. Allergies and medications reviewed and updated.  Review of Systems  Per HPI unless specifically indicated above     Objective:    BP 154/86 mmHg  Pulse 86  Temp(Src) 98.3 F (36.8 C)  Ht 5' 4.6" (1.641 m)  Wt 185 lb 6.4 oz (84.097 kg)  BMI 31.23 kg/m2  SpO2 96%  LMP  (LMP Unknown)  Wt Readings from Last 3 Encounters:  07/10/15 185 lb 6.4 oz (84.097 kg)  06/26/15 183 lb 6.4 oz (83.19 kg)  04/07/15 181 lb 6.4 oz (82.283 kg)    Physical Exam  Constitutional: She is oriented to person, place, and time. She appears well-developed and well-nourished. No distress.  HENT:  Head: Normocephalic and atraumatic.   Eyes: Conjunctivae and lids are normal. Right eye exhibits no discharge. Left eye exhibits no discharge. No scleral icterus.  Cardiovascular: Normal rate.   Pulmonary/Chest: Effort normal.  Abdominal: Normal appearance. There is no splenomegaly or hepatomegaly.  Musculoskeletal: Normal range of motion.       Right shoulder: She exhibits tenderness and decreased strength. She exhibits normal range of motion.  Tenderness biceps groove.    Neurological: She is alert and oriented to person, place, and time.  Skin: Skin is intact. No rash noted. No pallor.  Psychiatric: She has a normal mood and affect. Her behavior is normal. Judgment and thought content normal.      Assessment & Plan:   Problem List Items Addressed This Visit      Unprioritized   Biceps tendonitis on right    Won't be any help with steroid injection here.  Will refer to Orthopedics for further management.  X-ray shows OA as well      Relevant Orders   Ambulatory referral to Orthopedic Surgery   Diabetes ([place])    Hgb A1C is 9.7.  Increase Metformin to 2 Grams/day.  Start Glipizide.  Consider GLP1 next visit      Relevant Medications   metFORMIN (GLUCOPHAGE) 1000 MG tablet   glipiZIDE (GLUCOTROL) 5 MG tablet   Other Relevant Orders   Comprehensive metabolic panel   Bayer DCA Hb A1c  Waived   Hyperlipidemia    Refuses statins      Relevant Orders   Lipid Panel Piccolo, Waived   Hypertension   Relevant Orders   Comprehensive metabolic panel   Microalbumin, Urine Waived   Uric acid    Other Visit Diagnoses    Hematuria    -  Primary    resolved    Relevant Orders    UA/M w/rflx Culture, Routine        Follow up plan: Return in about 3 months (around 10/10/2015).

## 2015-07-11 ENCOUNTER — Telehealth: Payer: Self-pay | Admitting: Unknown Physician Specialty

## 2015-07-11 LAB — COMPREHENSIVE METABOLIC PANEL
A/G RATIO: 1.6 (ref 1.2–2.2)
ALBUMIN: 4.2 g/dL (ref 3.6–4.8)
ALK PHOS: 49 IU/L (ref 39–117)
ALT: 91 IU/L — ABNORMAL HIGH (ref 0–32)
AST: 95 IU/L — ABNORMAL HIGH (ref 0–40)
BILIRUBIN TOTAL: 0.4 mg/dL (ref 0.0–1.2)
BUN / CREAT RATIO: 21 (ref 12–28)
BUN: 12 mg/dL (ref 8–27)
CHLORIDE: 94 mmol/L — AB (ref 96–106)
CO2: 26 mmol/L (ref 18–29)
Calcium: 9.7 mg/dL (ref 8.7–10.3)
Creatinine, Ser: 0.58 mg/dL (ref 0.57–1.00)
GFR calc Af Amer: 112 mL/min/{1.73_m2} (ref >59)
GFR calc non Af Amer: 97 mL/min/{1.73_m2} (ref >59)
GLOBULIN, TOTAL: 2.6 g/dL (ref 1.5–4.5)
GLUCOSE: 151 mg/dL — AB (ref 65–99)
POTASSIUM: 4.4 mmol/L (ref 3.5–5.2)
SODIUM: 137 mmol/L (ref 134–144)
Total Protein: 6.8 g/dL (ref 6.0–8.5)

## 2015-07-11 LAB — LIPID PANEL PICCOLO, WAIVED

## 2015-07-11 LAB — URIC ACID: Uric Acid: 5.3 mg/dL (ref 2.5–7.1)

## 2015-07-11 NOTE — Telephone Encounter (Signed)
Talked to pt about her labs.  Liver enzymes elevated.  She does have a history.  Will recheck in 3 months

## 2015-07-11 NOTE — Progress Notes (Signed)
Quick Note:  Patient notified of results by phone. ______

## 2015-07-12 LAB — MICROALBUMIN, URINE WAIVED
Creatinine, Urine Waived: 200 mg/dL (ref 10–300)
Microalb, Ur Waived: 10 mg/L (ref 0–19)
Microalb/Creat Ratio: <30 mg/g (ref <30)

## 2015-07-12 LAB — UA/M W/RFLX CULTURE, ROUTINE
Bilirubin, UA: NEGATIVE
Glucose, UA: NEGATIVE
Ketones, UA: NEGATIVE
Nitrite, UA: NEGATIVE
PH UA: 5 (ref 5.0–7.5)
Protein, UA: NEGATIVE
RBC, UA: NEGATIVE
Urobilinogen, Ur: 0.2 mg/dL (ref 0.2–1.0)

## 2015-07-12 LAB — MICROSCOPIC EXAMINATION

## 2015-07-12 LAB — URINE CULTURE, REFLEX: Organism ID, Bacteria: NO GROWTH

## 2015-07-12 LAB — BAYER DCA HB A1C WAIVED: HB A1C (BAYER DCA - WAIVED): 9.7 % — ABNORMAL HIGH (ref <7.0)

## 2015-08-09 ENCOUNTER — Other Ambulatory Visit: Payer: Self-pay | Admitting: Unknown Physician Specialty

## 2015-10-04 ENCOUNTER — Other Ambulatory Visit: Payer: Self-pay | Admitting: Unknown Physician Specialty

## 2015-10-06 ENCOUNTER — Other Ambulatory Visit: Payer: Self-pay | Admitting: Unknown Physician Specialty

## 2015-10-09 NOTE — Telephone Encounter (Signed)
Your patient 

## 2015-10-10 ENCOUNTER — Ambulatory Visit: Payer: Medicare Other | Admitting: Unknown Physician Specialty

## 2015-10-17 ENCOUNTER — Other Ambulatory Visit: Payer: Self-pay

## 2015-10-17 ENCOUNTER — Other Ambulatory Visit: Payer: Self-pay | Admitting: Unknown Physician Specialty

## 2015-10-17 ENCOUNTER — Ambulatory Visit (INDEPENDENT_AMBULATORY_CARE_PROVIDER_SITE_OTHER): Payer: Medicare Other | Admitting: Unknown Physician Specialty

## 2015-10-17 ENCOUNTER — Telehealth: Payer: Self-pay

## 2015-10-17 ENCOUNTER — Encounter: Payer: Self-pay | Admitting: Unknown Physician Specialty

## 2015-10-17 VITALS — BP 115/71 | HR 96 | Temp 98.2°F | Ht 65.1 in | Wt 186.0 lb

## 2015-10-17 DIAGNOSIS — E119 Type 2 diabetes mellitus without complications: Secondary | ICD-10-CM | POA: Diagnosis not present

## 2015-10-17 DIAGNOSIS — Z23 Encounter for immunization: Secondary | ICD-10-CM

## 2015-10-17 DIAGNOSIS — R7401 Elevation of levels of liver transaminase levels: Secondary | ICD-10-CM

## 2015-10-17 DIAGNOSIS — I10 Essential (primary) hypertension: Secondary | ICD-10-CM | POA: Diagnosis not present

## 2015-10-17 DIAGNOSIS — G8929 Other chronic pain: Secondary | ICD-10-CM

## 2015-10-17 DIAGNOSIS — M545 Low back pain: Secondary | ICD-10-CM

## 2015-10-17 DIAGNOSIS — E785 Hyperlipidemia, unspecified: Secondary | ICD-10-CM | POA: Diagnosis not present

## 2015-10-17 DIAGNOSIS — R740 Nonspecific elevation of levels of transaminase and lactic acid dehydrogenase [LDH]: Secondary | ICD-10-CM | POA: Diagnosis not present

## 2015-10-17 LAB — HEMOGLOBIN A1C: Hemoglobin A1C: 7.4

## 2015-10-17 MED ORDER — TIZANIDINE HCL 4 MG PO TABS: 4.0000 mg | ORAL_TABLET | Freq: Four times a day (QID) | ORAL | 0 refills | Status: DC | PRN

## 2015-10-17 MED ORDER — OMEPRAZOLE 20 MG PO CPDR: 20.0000 mg | DELAYED_RELEASE_CAPSULE | Freq: Every day | ORAL | 3 refills | Status: DC

## 2015-10-17 MED ORDER — BENAZEPRIL-HYDROCHLOROTHIAZIDE 20-25 MG PO TABS: 1.0000 | ORAL_TABLET | Freq: Every day | ORAL | 1 refills | Status: DC

## 2015-10-17 MED ORDER — GLIPIZIDE 5 MG PO TABS: 5.0000 mg | ORAL_TABLET | Freq: Two times a day (BID) | ORAL | 1 refills | Status: DC

## 2015-10-17 MED ORDER — METFORMIN HCL 1000 MG PO TABS: 1000.0000 mg | ORAL_TABLET | Freq: Two times a day (BID) | ORAL | 3 refills | Status: DC

## 2015-10-17 MED ORDER — METHOCARBAMOL 500 MG PO TABS: 500.0000 mg | ORAL_TABLET | Freq: Four times a day (QID) | ORAL | 1 refills | Status: DC

## 2015-10-17 NOTE — Patient Instructions (Signed)
Pneumococcal Conjugate Vaccine (PCV13)   1. Why get vaccinated?  Vaccination can protect both children and adults from pneumococcal disease.  Pneumococcal disease is caused by bacteria that can spread from person to person through close contact. It can cause ear infections, and it can also lead to more serious infections of the:  · Lungs (pneumonia),  · Blood (bacteremia), and  · Covering of the brain and spinal cord (meningitis).  Pneumococcal pneumonia is most common among adults. Pneumococcal meningitis can cause deafness and brain damage, and it kills about 1 child in 10 who get it.  Anyone can get pneumococcal disease, but children under 2 years of age and adults 65 years and older, people with certain medical conditions, and cigarette smokers are at the highest risk.  Before there was a vaccine, the [place] saw:  · more than 700 cases of meningitis,  · about 13,000 blood infections,  · about 5 million ear infections, and  · about 200 deaths  in children under 5 each year from pneumococcal disease. Since vaccine became available, severe pneumococcal disease in these children has fallen by 88%.  About 18,000 older adults die of pneumococcal disease each year in the [place].  Treatment of pneumococcal infections with penicillin and other drugs is not as effective as it used to be, because some strains of the disease have become resistant to these drugs. This makes prevention of the disease, through vaccination, even more important.  2. PCV13 vaccine  Pneumococcal conjugate vaccine (called PCV13) protects against 13 types of pneumococcal bacteria.  PCV13 is routinely given to children at 2, 4, 6, and 12-15 months of age. It is also recommended for children and adults 2 to 64 years of age with certain health conditions, and for all adults 65 years of age and older. Your doctor can give you details.  3. Some people should not get this vaccine  Anyone who has ever had a life-threatening allergic reaction  to a dose of this vaccine, to an earlier pneumococcal vaccine called PCV7, or to any vaccine containing diphtheria toxoid (for example, DTaP), should not get PCV13.  Anyone with a severe allergy to any component of PCV13 should not get the vaccine. Tell your doctor if the person being vaccinated has any severe allergies.  If the person scheduled for vaccination is not feeling well, your healthcare provider might decide to reschedule the shot on another day.  4. Risks of a vaccine reaction  With any medicine, including vaccines, there is a chance of reactions. These are usually mild and go away on their own, but serious reactions are also possible.  Problems reported following PCV13 varied by age and dose in the series. The most common problems reported among children were:  · About half became drowsy after the shot, had a temporary loss of appetite, or had redness or tenderness where the shot was given.  · About 1 out of 3 had swelling where the shot was given.  · About 1 out of 3 had a mild fever, and about 1 in 20 had a fever over 102.2°F.  · Up to about 8 out of 10 became fussy or irritable.  Adults have reported pain, redness, and swelling where the shot was given; also mild fever, fatigue, headache, chills, or muscle pain.  Young children who get PCV13 along with inactivated flu vaccine at the same time may be at increased risk for seizures caused by fever. Ask your doctor for more information.  Problems that   could happen after any vaccine:  · People sometimes faint after a medical procedure, including vaccination. Sitting or lying down for about 15 minutes can help prevent fainting, and injuries caused by a fall. Tell your doctor if you feel dizzy, or have vision changes or ringing in the ears.  · Some older children and adults get severe pain in the shoulder and have difficulty moving the arm where a shot was given. This happens very rarely.  · Any medication can cause a severe allergic reaction. Such  reactions from a vaccine are very rare, estimated at about 1 in a million doses, and would happen within a few minutes to a few hours after the vaccination.  As with any medicine, there is a very small chance of a vaccine causing a serious injury or death.  The safety of vaccines is always being monitored. For more information, visit: www.cdc.gov/vaccinesafety/  5. What if there is a serious reaction?  What should I look for?  · Look for anything that concerns you, such as signs of a severe allergic reaction, very high fever, or unusual behavior.  Signs of a severe allergic reaction can include hives, swelling of the face and throat, difficulty breathing, a fast heartbeat, dizziness, and weakness-usually within a few minutes to a few hours after the vaccination.  What should I do?  · If you think it is a severe allergic reaction or other emergency that can't wait, call 9-1-1 or get the person to the nearest hospital. Otherwise, call your doctor.  Reactions should be reported to the Vaccine Adverse Event Reporting System (VAERS). Your doctor should file this report, or you can do it yourself through the VAERS web site at www.vaers.hhs.gov, or by calling 1-800-822-7967.  VAERS does not give medical advice.  6. The National Vaccine Injury Compensation Program  The National Vaccine Injury Compensation Program (VICP) is a federal program that was created to compensate people who may have been injured by certain vaccines.  Persons who believe they may have been injured by a vaccine can learn about the program and about filing a claim by calling 1-800-338-2382 or visiting the VICP website at www.hrsa.gov/vaccinecompensation. There is a time limit to file a claim for compensation.  7. How can I learn more?  · Ask your healthcare provider. He or she can give you the vaccine package insert or suggest other sources of information.  · Call your local or state health department.  · Contact the Centers for Disease Control and  Prevention (CDC):    Call 1-800-232-4636 (1-800-CDC-INFO) or    Visit CDC's website at www.cdc.gov/vaccines  Vaccine Information Statement  PCV13 Vaccine (12/23/2013)     This information is not intended to replace advice given to you by your health care provider. Make sure you discuss any questions you have with your health care provider.     Document Released: 12/02/2005 Document Revised: 02/25/2014 Document Reviewed: 12/30/2013  Elsevier Interactive Patient Education ©2016 [employer]

## 2015-10-17 NOTE — Telephone Encounter (Signed)
Your patient 

## 2015-10-17 NOTE — Progress Notes (Signed)
BP 115/71 (BP Location: Left Arm, Patient Position: Sitting, Cuff Size: Large)   Pulse 96   Temp 98.2 F (36.8 C)   Ht 5' 5.1" (1.654 m)   Wt 186 lb (84.4 kg)   LMP  (LMP Unknown)   SpO2 95%   BMI 30.86 kg/m    Subjective:    Patient ID: Cheryl Mendez, female    DOB: Sep 28, 1950, 65 y.o.   MRN: [id number]  HPI: Cheryl Mendez is a 65 y.o. female  Chief Complaint  Patient presents with  . Diabetes    pt states last eye exam was 12/2014. Will fax form to Dr. Gloriann Loan  . Hyperlipidemia  . Hypertension  . Gastroesophageal Reflux  . Back Pain    x3 days, states she was cleaning and thinks she hurt it then. States she feels like it is spasing and hurts in lower back.    Diabetes: Using medications without difficulties No hypoglycemic episodes No hyperglycemic episodes Feet problems: none Blood Sugars averaging: not checking  eye exam within last year Last Hgb A1C: 9.4  Hypertension  Using medications without difficulty Average home BPs   Using medication without problems or lightheadedness No chest pain with exertion or shortness of breath No Edema  Elevated Cholesterol Using medications without problems No Muscle aches  Diet: Working on cutting back on fruit. Exercise: walking dog  Back Pain Patient reports low back pain x 3 days, Excedrin and icy hot used for temporary relief. On exam sensation intact bilaterally, limited ROM, no tenderness to palpation.  HPI  Relevant past medical, surgical, family and social history reviewed and updated as indicated. Interim medical history since our last visit reviewed. Allergies and medications reviewed and updated.  Review of Systems  Per HPI unless specifically indicated above     Objective:    BP 115/71 (BP Location: Left Arm, Patient Position: Sitting, Cuff Size: Large)   Pulse 96   Temp 98.2 F (36.8 C)   Ht 5' 5.1" (1.654 m)   Wt 186 lb (84.4 kg)   LMP  (LMP Unknown)   SpO2 95%   BMI 30.86 kg/m   Wt  Readings from Last 3 Encounters:  10/17/15 186 lb (84.4 kg)  07/10/15 185 lb 6.4 oz (84.1 kg)  06/26/15 183 lb 6.4 oz (83.2 kg)    Physical Exam  Constitutional: She is oriented to person, place, and time. She appears well-developed and well-nourished. No distress.  HENT:  Head: Normocephalic and atraumatic.  Eyes: Conjunctivae and lids are normal. Right eye exhibits no discharge. Left eye exhibits no discharge. No scleral icterus.  Neck: Normal range of motion. Neck supple. No JVD present. Carotid bruit is not present.  Cardiovascular: Normal rate, regular rhythm and normal heart sounds.   Pulmonary/Chest: Effort normal and breath sounds normal.  Abdominal: Normal appearance. There is no splenomegaly or hepatomegaly.  Musculoskeletal:       Lumbar back: She exhibits decreased range of motion.  Neurological: She is alert and oriented to person, place, and time.  Skin: Skin is warm, dry and intact. No rash noted. No pallor.  Psychiatric: She has a normal mood and affect. Her behavior is normal. Judgment and thought content normal.    Results for orders placed or performed in visit on 07/10/15  Microscopic Examination  Result Value Ref Range   WBC, UA 0-5 0 - 5 /hpf   RBC, UA 0-2 0 - 2 /hpf   Epithelial Cells (non renal) 0-10 0 - 10 /  hpf   Bacteria, UA Few None seen/Few  Comprehensive metabolic panel  Result Value Ref Range   Glucose 151 (H) 65 - 99 mg/dL   BUN 12 8 - 27 mg/dL   Creatinine, Ser 0.58 0.57 - 1.00 mg/dL   GFR calc non Af Amer 97 >59 mL/min/1.73   GFR calc Af Amer 112 >59 mL/min/1.73   BUN/Creatinine Ratio 21 12 - 28   Sodium 137 134 - 144 mmol/L   Potassium 4.4 3.5 - 5.2 mmol/L   Chloride 94 (L) 96 - 106 mmol/L   CO2 26 18 - 29 mmol/L   Calcium 9.7 8.7 - 10.3 mg/dL   Total Protein 6.8 6.0 - 8.5 g/dL   Albumin 4.2 3.6 - 4.8 g/dL   Globulin, Total 2.6 1.5 - 4.5 g/dL   Albumin/Globulin Ratio 1.6 1.2 - 2.2   Bilirubin Total 0.4 0.0 - 1.2 mg/dL   Alkaline  Phosphatase 49 39 - 117 IU/L   AST 95 (H) 0 - 40 IU/L   ALT 91 (H) 0 - 32 IU/L  Bayer DCA Hb A1c Waived  Result Value Ref Range   Bayer DCA Hb A1c Waived 9.7 (H) <7.0 %  Lipid Panel Piccolo, Waived  Result Value Ref Range   Cholesterol Piccolo, Waived CANCELED mg/dL   HDL Chol Piccolo, Waived CANCELED    Triglycerides Piccolo,Waived CANCELED   Microalbumin, Urine Waived  Result Value Ref Range   Microalb, Ur Waived 10 0 - 19 mg/L   Creatinine, Urine Waived 200 10 - 300 mg/dL   Microalb/Creat Ratio <30 <30 mg/g  Uric acid  Result Value Ref Range   Uric Acid 5.3 2.5 - 7.1 mg/dL  UA/M w/rflx Culture, Routine  Result Value Ref Range   Specific Gravity, UA >1.030 (H) 1.005 - 1.030   pH, UA 5.0 5.0 - 7.5   Color, UA Yellow Yellow   Appearance Ur Clear Clear   Leukocytes, UA Trace (A) Negative   Protein, UA Negative Negative/Trace   Glucose, UA Negative Negative   Ketones, UA Negative Negative   RBC, UA Negative Negative   Bilirubin, UA Negative Negative   Urobilinogen, Ur 0.2 0.2 - 1.0 mg/dL   Nitrite, UA Negative Negative   Microscopic Examination See below:    Urinalysis Reflex Comment   Urine Culture, Routine  Result Value Ref Range   Urine Culture, Routine Final report    Urine Culture result 1 No growth       Assessment & Plan:   Problem List Items Addressed This Visit      Unprioritized   Diabetes ([place])    Improved with Hgb A1C of 7.4.  Will continue with medication and improve lifestyle.  Recheck in 3 months      Relevant Medications   benazepril-hydrochlorthiazide (LOTENSIN HCT) 20-25 MG tablet   glipiZIDE (GLUCOTROL) 5 MG tablet   metFORMIN (GLUCOPHAGE) 1000 MG tablet   Other Relevant Orders   Comprehensive metabolic panel   Bayer DCA Hb A1c Waived   Microalbumin, Urine Waived   Hyperlipidemia    Refusing statin and any other cholesterol lowering medications      Relevant Medications   benazepril-hydrochlorthiazide (LOTENSIN HCT) 20-25 MG tablet    Other Relevant Orders   Lipid Panel w/o Chol/HDL Ratio   Hypertension    Stable, continue present medications.        Relevant Medications   benazepril-hydrochlorthiazide (LOTENSIN HCT) 20-25 MG tablet   Other Relevant Orders   Comprehensive metabolic panel   Microalbumin,  Urine Waived   Lumbago    Rx for Robaxin.  Encouraged exercises.        Relevant Medications   methocarbamol (ROBAXIN) 500 MG tablet   Other Relevant Orders   UA/M w/rflx Culture, Routine    Other Visit Diagnoses    Need for pneumococcal vaccination    -  Primary   Relevant Orders   Pneumococcal conjugate vaccine 13-valent IM (Completed)   Elevated transaminase level           Follow up plan: Return in about 3 months (around 01/17/2016).

## 2015-10-17 NOTE — Assessment & Plan Note (Signed)
Rx for Robaxin.  Encouraged exercises.

## 2015-10-17 NOTE — Telephone Encounter (Signed)
Pharmacy sent a fax stating that methocarbamol is not covered by patient's insurance. They suggest that we change the rx to meloxicam or tizanidine tablets. Pharmacy is [facility] in [place].

## 2015-10-17 NOTE — Assessment & Plan Note (Signed)
Improved with Hgb A1C of 7.4.  Will continue with medication and improve lifestyle.  Recheck in 3 months

## 2015-10-17 NOTE — Assessment & Plan Note (Signed)
Refusing statin and any other cholesterol lowering medications

## 2015-10-17 NOTE — Assessment & Plan Note (Signed)
Stable, continue present medications.   

## 2015-10-18 LAB — COMPREHENSIVE METABOLIC PANEL
A/G RATIO: 1.7 (ref 1.2–2.2)
ALK PHOS: 44 IU/L (ref 39–117)
ALT: 62 IU/L — AB (ref 0–32)
AST: 46 IU/L — AB (ref 0–40)
Albumin: 4.3 g/dL (ref 3.6–4.8)
BILIRUBIN TOTAL: 0.4 mg/dL (ref 0.0–1.2)
BUN/Creatinine Ratio: 21 (ref 12–28)
BUN: 15 mg/dL (ref 8–27)
CALCIUM: 9.8 mg/dL (ref 8.7–10.3)
CHLORIDE: 95 mmol/L — AB (ref 96–106)
CO2: 26 mmol/L (ref 18–29)
Creatinine, Ser: 0.72 mg/dL (ref 0.57–1.00)
GFR calc Af Amer: 102 mL/min/{1.73_m2} (ref >59)
GFR calc non Af Amer: 88 mL/min/{1.73_m2} (ref >59)
GLOBULIN, TOTAL: 2.6 g/dL (ref 1.5–4.5)
Glucose: 248 mg/dL — ABNORMAL HIGH (ref 65–99)
POTASSIUM: 4.4 mmol/L (ref 3.5–5.2)
SODIUM: 139 mmol/L (ref 134–144)
Total Protein: 6.9 g/dL (ref 6.0–8.5)

## 2015-10-18 LAB — LIPID PANEL W/O CHOL/HDL RATIO
Cholesterol, Total: 277 mg/dL — ABNORMAL HIGH (ref 100–199)
HDL: 37 mg/dL — ABNORMAL LOW (ref >39)
LDL CALC: 177 mg/dL — AB (ref 0–99)
Triglycerides: 313 mg/dL — ABNORMAL HIGH (ref 0–149)
VLDL CHOLESTEROL CAL: 63 mg/dL — AB (ref 5–40)

## 2015-10-19 ENCOUNTER — Encounter: Payer: Self-pay | Admitting: Family Medicine

## 2015-10-19 LAB — MICROSCOPIC EXAMINATION

## 2015-10-19 LAB — UA/M W/RFLX CULTURE, ROUTINE
BILIRUBIN UA: NEGATIVE
GLUCOSE, UA: NEGATIVE
KETONES UA: NEGATIVE
NITRITE UA: NEGATIVE
Protein, UA: NEGATIVE
RBC UA: NEGATIVE
SPEC GRAV UA: 1.02 (ref 1.005–1.030)
Urobilinogen, Ur: 0.2 mg/dL (ref 0.2–1.0)
pH, UA: 5 (ref 5.0–7.5)

## 2015-10-19 LAB — MICROALBUMIN, URINE WAIVED
CREATININE, URINE WAIVED: 100 mg/dL (ref 10–300)
MICROALB, UR WAIVED: 10 mg/L (ref 0–19)
Microalb/Creat Ratio: <30 mg/g (ref <30)

## 2015-10-19 LAB — URINE CULTURE, REFLEX

## 2015-10-19 LAB — BAYER DCA HB A1C WAIVED: HB A1C: 7.4 % — AB (ref <7.0)

## 2016-01-19 ENCOUNTER — Ambulatory Visit (INDEPENDENT_AMBULATORY_CARE_PROVIDER_SITE_OTHER): Payer: Medicare Other | Admitting: Unknown Physician Specialty

## 2016-01-19 ENCOUNTER — Ambulatory Visit: Payer: Medicare Other | Admitting: Unknown Physician Specialty

## 2016-01-19 ENCOUNTER — Encounter: Payer: Self-pay | Admitting: Unknown Physician Specialty

## 2016-01-19 VITALS — BP 128/85 | HR 103 | Temp 98.3°F | Ht 65.5 in | Wt 187.6 lb

## 2016-01-19 DIAGNOSIS — E1165 Type 2 diabetes mellitus with hyperglycemia: Secondary | ICD-10-CM | POA: Diagnosis not present

## 2016-01-19 DIAGNOSIS — I10 Essential (primary) hypertension: Secondary | ICD-10-CM | POA: Diagnosis not present

## 2016-01-19 DIAGNOSIS — Z23 Encounter for immunization: Secondary | ICD-10-CM | POA: Diagnosis not present

## 2016-01-19 DIAGNOSIS — E782 Mixed hyperlipidemia: Secondary | ICD-10-CM | POA: Diagnosis not present

## 2016-01-19 DIAGNOSIS — E119 Type 2 diabetes mellitus without complications: Secondary | ICD-10-CM

## 2016-01-19 LAB — BAYER DCA HB A1C WAIVED: HB A1C: 8.1 % — AB (ref <7.0)

## 2016-01-19 MED ORDER — GLIPIZIDE ER 10 MG PO TB24: 10.0000 mg | ORAL_TABLET | Freq: Every day | ORAL | 1 refills | Status: DC

## 2016-01-19 NOTE — Patient Instructions (Signed)

## 2016-01-19 NOTE — Assessment & Plan Note (Signed)
Stable, continue present medications.   

## 2016-01-19 NOTE — Assessment & Plan Note (Signed)
Poor control with Hgb A1C of 8.1.  She tells me she stopped her evening Glipizide.  Change to 10 mg daily XR instead of 5 mg BID

## 2016-01-19 NOTE — Progress Notes (Signed)
BP 128/85 (BP Location: Left Arm, Patient Position: Sitting, Cuff Size: Large)   Pulse (!) 103   Temp 98.3 F (36.8 C)   Ht 5' 5.5" (1.664 m)   Wt 187 lb 9.6 oz (85.1 kg)   LMP  (LMP Unknown)   SpO2 96%   BMI 30.74 kg/m    Subjective:    Patient ID: Cheryl Mendez, female    DOB: 1950-04-20, 65 y.o.   MRN: [id number]  HPI: Cheryl Mendez is a 65 y.o. female  Chief Complaint  Patient presents with  . Diabetes    pt states last eye exam was November 2016, and that she is planning to schedule one soon  . Hypertension   Diabetes: Using medications without difficulties No hypoglycemic episodes No hyperglycemic episodes Feet problems: Blood Sugars averaging: Not checking eye exam within last year Last Hgb A1C:7.4  Hypertension  Using medications without difficulty Average home BPs Not checking  Using medication without problems or lightheadedness No chest pain with exertion or shortness of breath No Edema  Elevated Cholesterol Refuses statins Diet/Exercise: Walking her dog.    Relevant past medical, surgical, family and social history reviewed and updated as indicated. Interim medical history since our last visit reviewed. Allergies and medications reviewed and updated.  Review of Systems  Per HPI unless specifically indicated above     Objective:    BP 128/85 (BP Location: Left Arm, Patient Position: Sitting, Cuff Size: Large)   Pulse (!) 103   Temp 98.3 F (36.8 C)   Ht 5' 5.5" (1.664 m)   Wt 187 lb 9.6 oz (85.1 kg)   LMP  (LMP Unknown)   SpO2 96%   BMI 30.74 kg/m   Wt Readings from Last 3 Encounters:  01/19/16 187 lb 9.6 oz (85.1 kg)  10/17/15 186 lb (84.4 kg)  07/10/15 185 lb 6.4 oz (84.1 kg)    Physical Exam  Constitutional: She is oriented to person, place, and time. She appears well-developed and well-nourished. No distress.  HENT:  Head: Normocephalic and atraumatic.  Eyes: Conjunctivae and lids are normal. Right eye exhibits no discharge.  Left eye exhibits no discharge. No scleral icterus.  Neck: Normal range of motion. Neck supple. No JVD present. Carotid bruit is not present.  Cardiovascular: Normal rate, regular rhythm and normal heart sounds.   Pulmonary/Chest: Effort normal and breath sounds normal.  Abdominal: Normal appearance. There is no splenomegaly or hepatomegaly.  Musculoskeletal: Normal range of motion.  Neurological: She is alert and oriented to person, place, and time.  Skin: Skin is warm, dry and intact. No rash noted. No pallor.  Psychiatric: She has a normal mood and affect. Her behavior is normal. Judgment and thought content normal.    Results for orders placed or performed in visit on 10/19/15  HM DIABETES EYE EXAM  Result Value Ref Range   HM Diabetic Eye Exam No Retinopathy No Retinopathy      Assessment & Plan:   Problem List Items Addressed This Visit      Unprioritized   Hyperlipidemia    Refusing statins      Hypertension    Stable, continue present medications.        Poorly controlled type 2 diabetes mellitus (HCC)    Poor control with Hgb A1C of 8.1.  She tells me she stopped her evening Glipizide.  Change to 10 mg daily XR instead of 5 mg BID       Other Visit Diagnoses  Need for influenza vaccination    -  Primary   Relevant Orders   Flu vaccine HIGH DOSE PF (Completed)       Follow up plan: Return in about 3 months (around 04/18/2016) for physical.

## 2016-01-19 NOTE — Assessment & Plan Note (Signed)
Refusing statins 

## 2016-01-20 LAB — COMPREHENSIVE METABOLIC PANEL
A/G RATIO: 1.7 (ref 1.2–2.2)
ALBUMIN: 4.5 g/dL (ref 3.6–4.8)
ALK PHOS: 50 IU/L (ref 39–117)
ALT: 114 IU/L — ABNORMAL HIGH (ref 0–32)
AST: 117 IU/L — AB (ref 0–40)
BILIRUBIN TOTAL: 0.3 mg/dL (ref 0.0–1.2)
BUN / CREAT RATIO: 20 (ref 12–28)
BUN: 13 mg/dL (ref 8–27)
CHLORIDE: 93 mmol/L — AB (ref 96–106)
CO2: 25 mmol/L (ref 18–29)
CREATININE: 0.64 mg/dL (ref 0.57–1.00)
Calcium: 10.1 mg/dL (ref 8.7–10.3)
GFR calc Af Amer: 108 mL/min/{1.73_m2} (ref >59)
GFR calc non Af Amer: 94 mL/min/{1.73_m2} (ref >59)
GLOBULIN, TOTAL: 2.6 g/dL (ref 1.5–4.5)
Glucose: 166 mg/dL — ABNORMAL HIGH (ref 65–99)
POTASSIUM: 4.1 mmol/L (ref 3.5–5.2)
SODIUM: 140 mmol/L (ref 134–144)
Total Protein: 7.1 g/dL (ref 6.0–8.5)

## 2016-01-24 ENCOUNTER — Encounter: Payer: Self-pay | Admitting: Unknown Physician Specialty

## 2016-01-24 NOTE — Progress Notes (Signed)
Patient notified of results by phone and letter

## 2016-05-06 ENCOUNTER — Ambulatory Visit (INDEPENDENT_AMBULATORY_CARE_PROVIDER_SITE_OTHER): Payer: Medicare Other | Admitting: Unknown Physician Specialty

## 2016-05-06 ENCOUNTER — Encounter: Payer: Self-pay | Admitting: Unknown Physician Specialty

## 2016-05-06 VITALS — BP 113/75 | HR 89 | Temp 98.3°F | Ht 64.7 in | Wt 181.6 lb

## 2016-05-06 DIAGNOSIS — E782 Mixed hyperlipidemia: Secondary | ICD-10-CM

## 2016-05-06 DIAGNOSIS — E1165 Type 2 diabetes mellitus with hyperglycemia: Secondary | ICD-10-CM

## 2016-05-06 DIAGNOSIS — Z0000 Encounter for general adult medical examination without abnormal findings: Secondary | ICD-10-CM

## 2016-05-06 DIAGNOSIS — R634 Abnormal weight loss: Secondary | ICD-10-CM | POA: Diagnosis not present

## 2016-05-06 DIAGNOSIS — E2839 Other primary ovarian failure: Secondary | ICD-10-CM

## 2016-05-06 DIAGNOSIS — R748 Abnormal levels of other serum enzymes: Secondary | ICD-10-CM | POA: Diagnosis not present

## 2016-05-06 DIAGNOSIS — I10 Essential (primary) hypertension: Secondary | ICD-10-CM

## 2016-05-06 LAB — BAYER DCA HB A1C WAIVED: HB A1C (BAYER DCA - WAIVED): 7.2 % — ABNORMAL HIGH (ref <7.0)

## 2016-05-06 NOTE — Assessment & Plan Note (Signed)
Check CMP.  ?

## 2016-05-06 NOTE — Assessment & Plan Note (Signed)
Hgb A1C is 7.2 which is much improved.  Decreased appetite.

## 2016-05-06 NOTE — Assessment & Plan Note (Signed)
Stable, continue present medications.   

## 2016-05-06 NOTE — Assessment & Plan Note (Signed)
Check lipid panel.  Adjust meds as needed

## 2016-05-06 NOTE — Progress Notes (Addendum)
BP 113/75 (BP Location: Left Arm, Patient Position: Sitting, Cuff Size: Normal)   Pulse 89   Temp 98.3 F (36.8 C)   Ht 5' 4.7" (1.643 m)   Wt 181 lb 9.6 oz (82.4 kg)   LMP  (LMP Unknown)   SpO2 96%   BMI 30.50 kg/m    Subjective:    Patient ID: Cheryl Mendez, female    DOB: 03-Sep-1950, 66 y.o.   MRN: [id number]  HPI: Cheryl Mendez is a 66 y.o. female  Chief Complaint  Patient presents with  . Medicare Wellness  . Gastroesophageal Reflux    pt states she feels like the omeprazole is not helping anymore   . Diabetes    pt states she is getting eye exam soon   Diabetes: Using medications without difficulties.  No insulin No hypoglycemic episodes No hyperglycemic episodes Feet problems: Blood Sugars averaging: not checking.  States machine not working  eye exam within last year Last Hgb A1C: 8.1  Hypertension  Using medications without difficulty Average home BPs-Not checking   Using medication without problems or lightheadedness No chest pain with exertion or shortness of breath No Edema  Elevated Cholesterol Using medications without problems No Muscle aches  Diet: none Exercise:none  Family History  Problem Relation Age of Onset  . Diabetes Mother   . Heart disease Father   . Hypertension Father   . Eczema Sister   . COPD Sister    Social History   Social History  . Marital status: Widowed    Spouse name: N/A  . Number of children: N/A  . Years of education: N/A   Social History Main Topics  . Smoking status: Never Smoker  . Smokeless tobacco: Never Used  . Alcohol use No  . Drug use: No  . Sexual activity: No   Other Topics Concern  . None   Social History Narrative  . None   Past Medical History:  Diagnosis Date  . Allergy   . Arthritis   . Diabetes mellitus without complication ([place])   . Hyperlipidemia   . Hypertension   . Lumbago   . Migraine    History reviewed. No pertinent surgical history.  Functional Status  Survey: Is the patient deaf or have difficulty hearing?: Yes (right ear) Does the patient have difficulty seeing, even when wearing glasses/contacts?: No Does the patient have difficulty concentrating, remembering, or making decisions?: No Does the patient have difficulty walking or climbing stairs?: No Does the patient have difficulty dressing or bathing?: No Does the patient have difficulty doing errands alone such as visiting a doctor's office or shopping?: No  Depression screen [facility] 2/9 05/06/2016 06/26/2015  Decreased Interest 0 0  Down, Depressed, Hopeless 0 0  PHQ - 2 Score 0 0  Altered sleeping 1 -  Tired, decreased energy 1 -  Change in appetite 0 -  Feeling bad or failure about yourself  0 -  Trouble concentrating 0 -  Moving slowly or fidgety/restless 0 -  Suicidal thoughts 0 -  PHQ-9 Score 2 -   Mini cog is negative   Relevant past medical, surgical, family and social history reviewed and updated as indicated. Interim medical history since our last visit reviewed. Allergies and medications reviewed and updated.  Review of Systems  Constitutional: Positive for appetite change.       Wt loss pt states is purposeful but also states she has little appetite  HENT: Negative.   Eyes: Negative.   Respiratory:  Negative.   Cardiovascular: Negative.   Gastrointestinal: Negative.   Endocrine: Negative.   Genitourinary: Negative.   Musculoskeletal: Negative.   Allergic/Immunologic: Negative.   Neurological: Negative.   Hematological: Negative.   Psychiatric/Behavioral: Negative.     Per HPI unless specifically indicated above     Objective:    BP 113/75 (BP Location: Left Arm, Patient Position: Sitting, Cuff Size: Normal)   Pulse 89   Temp 98.3 F (36.8 C)   Ht 5' 4.7" (1.643 m)   Wt 181 lb 9.6 oz (82.4 kg)   LMP  (LMP Unknown)   SpO2 96%   BMI 30.50 kg/m   Wt Readings from Last 3 Encounters:  05/06/16 181 lb 9.6 oz (82.4 kg)  01/19/16 187 lb 9.6 oz (85.1 kg)   10/17/15 186 lb (84.4 kg)    Physical Exam  Constitutional: She is oriented to person, place, and time. She appears well-developed and well-nourished.  HENT:  Head: Normocephalic and atraumatic.  Eyes: Pupils are equal, round, and reactive to light. Right eye exhibits no discharge. Left eye exhibits no discharge. No scleral icterus.  Neck: Normal range of motion. Neck supple. Carotid bruit is not present. No thyromegaly present.  Cardiovascular: Normal rate, regular rhythm and normal heart sounds.  Exam reveals no gallop and no friction rub.   No murmur heard. Pulmonary/Chest: Effort normal and breath sounds normal. No respiratory distress. She has no wheezes. She has no rales.  Abdominal: Soft. Bowel sounds are normal. There is no tenderness. There is no rebound.  Genitourinary: Vagina normal and uterus normal. No breast swelling, tenderness or discharge. Cervix exhibits no motion tenderness, no discharge and no friability. Right adnexum displays no mass, no tenderness and no fullness. Left adnexum displays no mass, no tenderness and no fullness.  Musculoskeletal: Normal range of motion.  Lymphadenopathy:    She has no cervical adenopathy.  Neurological: She is alert and oriented to person, place, and time.  Skin: Skin is warm, dry and intact. No rash noted.  Psychiatric: She has a normal mood and affect. Her speech is normal and behavior is normal. Judgment and thought content normal. Cognition and memory are normal.   EKG was normal showing regular sinus rhythm without any chronic or acute changes.    Results for orders placed or performed in visit on 01/19/16  Comprehensive metabolic panel  Result Value Ref Range   Glucose 166 (H) 65 - 99 mg/dL   BUN 13 8 - 27 mg/dL   Creatinine, Ser 0.64 0.57 - 1.00 mg/dL   GFR calc non Af Amer 94 >59 mL/min/1.73   GFR calc Af Amer 108 >59 mL/min/1.73   BUN/Creatinine Ratio 20 12 - 28   Sodium 140 134 - 144 mmol/L   Potassium 4.1 3.5 - 5.2  mmol/L   Chloride 93 (L) 96 - 106 mmol/L   CO2 25 18 - 29 mmol/L   Calcium 10.1 8.7 - 10.3 mg/dL   Total Protein 7.1 6.0 - 8.5 g/dL   Albumin 4.5 3.6 - 4.8 g/dL   Globulin, Total 2.6 1.5 - 4.5 g/dL   Albumin/Globulin Ratio 1.7 1.2 - 2.2   Bilirubin Total 0.3 0.0 - 1.2 mg/dL   Alkaline Phosphatase 50 39 - 117 IU/L   AST 117 (H) 0 - 40 IU/L   ALT 114 (H) 0 - 32 IU/L  Bayer DCA Hb A1c Waived  Result Value Ref Range   Bayer DCA Hb A1c Waived 8.1 (H) <7.0 %  Assessment & Plan:   Problem List Items Addressed This Visit      Unprioritized   Elevated liver enzymes    Check CMP      Hyperlipidemia    Check lipid panel.  Adjust meds as needed      Relevant Orders   Lipid Panel w/o Chol/HDL Ratio   Hypertension    Stable, continue present medications.        Poorly controlled type 2 diabetes mellitus (HCC) - Primary    Hgb A1C is 7.2 which is much improved.  Decreased appetite.        Relevant Orders   Comprehensive metabolic panel   Bayer DCA Hb A1c Waived    Other Visit Diagnoses    Routine general medical examination at a health care facility       Relevant Orders   MM DIGITAL SCREENING BILATERAL   HIV antibody   Pap liquid-based and HPV (high risk)   Medicare annual wellness visit, initial       Relevant Orders   EKG 12-Lead (Completed)   Ambulatory referral to Gastroenterology   Ovarian failure       Relevant Orders   DG Bone Density   Weight loss       Noted 8 pound weight loss pt states is purposeful   Relevant Orders   Ambulatory referral to Gastroenterology   CBC with Differential/Platelet   TSH       Follow up plan: Return in about 3 months (around 08/06/2016).

## 2016-05-07 ENCOUNTER — Encounter: Payer: Self-pay | Admitting: Unknown Physician Specialty

## 2016-05-07 LAB — LIPID PANEL W/O CHOL/HDL RATIO
Cholesterol, Total: 248 mg/dL — ABNORMAL HIGH (ref 100–199)
HDL: 35 mg/dL — AB (ref >39)
LDL CALC: 158 mg/dL — AB (ref 0–99)
Triglycerides: 274 mg/dL — ABNORMAL HIGH (ref 0–149)
VLDL CHOLESTEROL CAL: 55 mg/dL — AB (ref 5–40)

## 2016-05-07 LAB — CBC WITH DIFFERENTIAL/PLATELET
BASOS: 0 %
Basophils Absolute: 0 10*3/uL (ref 0.0–0.2)
EOS (ABSOLUTE): 0.1 10*3/uL (ref 0.0–0.4)
EOS: 2 %
Hematocrit: 40 % (ref 34.0–46.6)
Hemoglobin: 13.5 g/dL (ref 11.1–15.9)
IMMATURE GRANULOCYTES: 0 %
Immature Grans (Abs): 0 10*3/uL (ref 0.0–0.1)
LYMPHS: 34 %
Lymphocytes Absolute: 2.5 10*3/uL (ref 0.7–3.1)
MCH: 29.1 pg (ref 26.6–33.0)
MCHC: 33.8 g/dL (ref 31.5–35.7)
MCV: 86 fL (ref 79–97)
MONOCYTES: 7 %
MONOS ABS: 0.5 10*3/uL (ref 0.1–0.9)
NEUTROS ABS: 4.2 10*3/uL (ref 1.4–7.0)
Neutrophils: 57 %
PLATELETS: 344 10*3/uL (ref 150–379)
RBC: 4.64 x10E6/uL (ref 3.77–5.28)
RDW: 13.2 % (ref 12.3–15.4)
WBC: 7.3 10*3/uL (ref 3.4–10.8)

## 2016-05-07 LAB — HIV ANTIBODY (ROUTINE TESTING W REFLEX): HIV SCREEN 4TH GENERATION: NONREACTIVE

## 2016-05-07 LAB — COMPREHENSIVE METABOLIC PANEL
ALT: 70 IU/L — ABNORMAL HIGH (ref 0–32)
AST: 59 IU/L — ABNORMAL HIGH (ref 0–40)
Albumin/Globulin Ratio: 1.5 (ref 1.2–2.2)
Albumin: 4.3 g/dL (ref 3.6–4.8)
Alkaline Phosphatase: 43 IU/L (ref 39–117)
BUN/Creatinine Ratio: 28 (ref 12–28)
BUN: 16 mg/dL (ref 8–27)
Bilirubin Total: 0.6 mg/dL (ref 0.0–1.2)
CALCIUM: 9.9 mg/dL (ref 8.7–10.3)
CHLORIDE: 93 mmol/L — AB (ref 96–106)
CO2: 23 mmol/L (ref 18–29)
CREATININE: 0.58 mg/dL (ref 0.57–1.00)
GFR, EST AFRICAN AMERICAN: 112 mL/min/{1.73_m2} (ref >59)
GFR, EST NON AFRICAN AMERICAN: 97 mL/min/{1.73_m2} (ref >59)
GLUCOSE: 167 mg/dL — AB (ref 65–99)
Globulin, Total: 2.8 g/dL (ref 1.5–4.5)
Potassium: 4.1 mmol/L (ref 3.5–5.2)
Sodium: 139 mmol/L (ref 134–144)
TOTAL PROTEIN: 7.1 g/dL (ref 6.0–8.5)

## 2016-05-07 LAB — TSH: TSH: 2.86 u[IU]/mL (ref 0.450–4.500)

## 2016-05-08 LAB — PAP LB AND HPV HIGH-RISK
HPV, HIGH-RISK: NEGATIVE
PAP Smear Comment: 0

## 2016-06-05 ENCOUNTER — Other Ambulatory Visit: Payer: Self-pay

## 2016-06-05 ENCOUNTER — Ambulatory Visit (INDEPENDENT_AMBULATORY_CARE_PROVIDER_SITE_OTHER): Payer: Medicare Other | Admitting: Gastroenterology

## 2016-06-05 ENCOUNTER — Telehealth: Payer: Self-pay

## 2016-06-05 ENCOUNTER — Encounter: Payer: Self-pay | Admitting: Gastroenterology

## 2016-06-05 VITALS — BP 115/77 | HR 101 | Temp 98.2°F | Ht 64.5 in | Wt 181.0 lb

## 2016-06-05 DIAGNOSIS — R6881 Early satiety: Secondary | ICD-10-CM

## 2016-06-05 DIAGNOSIS — R109 Unspecified abdominal pain: Secondary | ICD-10-CM | POA: Diagnosis not present

## 2016-06-05 DIAGNOSIS — Z1211 Encounter for screening for malignant neoplasm of colon: Secondary | ICD-10-CM | POA: Diagnosis not present

## 2016-06-05 DIAGNOSIS — K3184 Gastroparesis: Secondary | ICD-10-CM

## 2016-06-05 NOTE — Progress Notes (Signed)
Gastroenterology Consultation  Referring Provider:     Kathrine Haddock, NP Primary Care Physician:  Kathrine Haddock, NP Primary Gastroenterologist:  Dr. Jonathon Bellows  Reason for Consultation:     Weight loss         HPI:   Cheryl Mendez is a 66 y.o. y/o female referred for consultation & management  by Dr. Kathrine Haddock, NP.    She has been referred for weight loss. She does suffer from poorly controlled diabetes which has actually improved recently and her latest HBa1c is 7.2.   She says she is here today to see me because she was started on metformin - she says her sugars improved but , Since she commenced on metformin she has had abdominal pains.    Abdominal pain: Onset: began 2 years back , gotten more of "nauseated feeling " , eats 4-5 bites and feels full, pain when she swallows in the center of the chest  Site :center of the chest , central abdominal , continuous, as soon as she wakes up and has her cereal in the day , each episode lasts for hours , She does feel that the stomach sits in her stomach for a long time Radiation: localized . 6/10 intensity  Nature of pain: "feels like something rotten" "bad breath when she belches" Aggravating factors: eating  Relieving factors :"stomach pill" which is the PPi (nexium) Weight loss: lost some weight and gained  NSAID use:  No  PPI use : yes Gall bladder surgery: no  Frequency of bowel movements: everyday , normal  Change in bowel movements: no  Relief with bowel movements: yes  Gas/Bloating/Abdominal distension: no    Never had a colonoscopy   Past Medical History:  Diagnosis Date  . Allergy   . Arthritis   . Diabetes mellitus without complication ([place])   . Hyperlipidemia   . Hypertension   . Lumbago   . Migraine     History reviewed. No pertinent surgical history.  Prior to Admission medications   Medication Sig Start Date End Date Taking? Authorizing Provider  aspirin 81 MG tablet Take 81 mg by mouth daily.   Yes  Historical Provider, MD  benazepril-hydrochlorthiazide (LOTENSIN HCT) 20-25 MG tablet Take 1 tablet by mouth daily. 10/17/15  Yes Kathrine Haddock, NP  glipiZIDE (GLUCOTROL XL) 10 MG 24 hr tablet Take 1 tablet (10 mg total) by mouth daily with breakfast. 01/19/16  Yes Kathrine Haddock, NP  glucose blood (BAYER CONTOUR NEXT TEST) test strip Use daily 01/06/15  Yes Kathrine Haddock, NP  loratadine (CLARITIN) 10 MG tablet Take 10 mg by mouth daily as needed. 10/03/14  Yes Historical Provider, MD  metFORMIN (GLUCOPHAGE) 1000 MG tablet Take 1 tablet (1,000 mg total) by mouth 2 (two) times daily with a meal. 10/17/15  Yes Kathrine Haddock, NP  omeprazole (PRILOSEC) 20 MG capsule Take 1 capsule (20 mg total) by mouth daily. 10/17/15  Yes Kathrine Haddock, NP  tiZANidine (ZANAFLEX) 4 MG tablet Take 1 tablet (4 mg total) by mouth every 6 (six) hours as needed for muscle spasms. 10/17/15  Yes Kathrine Haddock, NP  albuterol (PROVENTIL HFA;VENTOLIN HFA) 108 (90 Base) MCG/ACT inhaler Inhale 2 puffs into the lungs every 6 (six) hours as needed for wheezing or shortness of breath. Patient not taking: Reported on 01/19/2016 04/07/15   Kathrine Haddock, NP    Family History  Problem Relation Age of Onset  . Diabetes Mother   . Heart disease Father   . Hypertension Father   .  Eczema Sister   . COPD Sister      Social History  Substance Use Topics  . Smoking status: Never Smoker  . Smokeless tobacco: Never Used  . Alcohol use No    Allergies as of 06/05/2016 - Review Complete 06/05/2016  Allergen Reaction Noted  . Penicillin g benzathine  11/01/2014    Review of Systems:    All systems reviewed and negative except where noted in HPI.   Physical Exam:  BP 115/77   Pulse (!) 101   Temp 98.2 F (36.8 C) (Oral)   Ht 5' 4.5" (1.638 m)   Wt 181 lb (82.1 kg)   LMP  (LMP Unknown)   BMI 30.59 kg/m  No LMP recorded (lmp unknown). Patient is postmenopausal. Psych:  Alert and cooperative. Normal mood and affect. General:    Alert,  Well-developed, well-nourished, pleasant and cooperative in NAD Head:  Normocephalic and atraumatic. Eyes:  Sclera clear, no icterus.   Conjunctiva pink. Ears:  Normal auditory acuity. Nose:  No deformity, discharge, or lesions. Mouth:  No deformity or lesions,oropharynx pink & moist. Neck:  Supple; no masses or thyromegaly. Lungs:  Respirations even and unlabored.  Clear throughout to auscultation.   No wheezes, crackles, or rhonchi. No acute distress. Heart:  Regular rate and rhythm; no murmurs, clicks, rubs, or gallops. Abdomen:  Normal bowel sounds.  No bruits.  Soft, non-tender and non-distended without masses, hepatosplenomegaly or hernias noted.  No guarding or rebound tenderness.    Msk:  Symmetrical without gross deformities. Good, equal movement & strength bilaterally. Neurologic:  Alert and oriented x3;  grossly normal neurologically. Psych:  Alert and cooperative. Normal mood and affect.  Imaging Studies: No results found.  Assessment and Plan:   Cheryl Mendez is a 66 y.o. y/o female has been referred for weight loss but she says the reason she is here to see me is for early satiety, abdominal discomfort. Based on her history it appears she may be suffering from diabetic gastroparesis. She has also never had a screening colonoscopy .    Plan  1. Gastric emptying study  2. EGD+colonoscopy  3. Closely monitor her weight and if we do note a drop in her weight that is dramatic then would need to consider CT chest /abdomen and pelvis.  4. I have counseled her on changing her diet assuming she has some gastroparesis to see if it helps with her symptoms.     Follow up in 12 weeks   Dr Jonathon Bellows MD

## 2016-06-05 NOTE — Telephone Encounter (Signed)
Gastroenterology Pre-Procedure Review  Request Date: 5/8 Requesting Physician: Dr. Vicente Males  PATIENT REVIEW QUESTIONS: The patient responded to the following health history questions as indicated:    1. Are you having any GI issues? yes (early satiety, abd discomfort) 2. Do you have a personal history of Polyps? no 3. Do you have a family history of Colon Cancer or Polyps? no 4. Diabetes Mellitus? yes (Type II) 5. Joint replacements in the past 12 months?no 6. Major health problems in the past 3 months?no 7. Any artificial heart valves, MVP, or defibrillator?no    MEDICATIONS & ALLERGIES:    Patient reports the following regarding taking any anticoagulation/antiplatelet therapy:   Plavix, Coumadin, Eliquis, Xarelto, Lovenox, Pradaxa, Brilinta, or Effient? no Aspirin? yes (81mg )  Patient confirms/reports the following medications:  Current Outpatient Prescriptions  Medication Sig Dispense Refill  . albuterol (PROVENTIL HFA;VENTOLIN HFA) 108 (90 Base) MCG/ACT inhaler Inhale 2 puffs into the lungs every 6 (six) hours as needed for wheezing or shortness of breath. (Patient not taking: Reported on 01/19/2016) 1 Inhaler 0  . aspirin 81 MG tablet Take 81 mg by mouth daily.    . benazepril-hydrochlorthiazide (LOTENSIN HCT) 20-25 MG tablet Take 1 tablet by mouth daily. 90 tablet 1  . glipiZIDE (GLUCOTROL XL) 10 MG 24 hr tablet Take 1 tablet (10 mg total) by mouth daily with breakfast. 90 tablet 1  . glucose blood (BAYER CONTOUR NEXT TEST) test strip Use daily 100 each 12  . loratadine (CLARITIN) 10 MG tablet Take 10 mg by mouth daily as needed.  4  . metFORMIN (GLUCOPHAGE) 1000 MG tablet Take 1 tablet (1,000 mg total) by mouth 2 (two) times daily with a meal. 180 tablet 3  . omeprazole (PRILOSEC) 20 MG capsule Take 1 capsule (20 mg total) by mouth daily. 90 capsule 3  . tiZANidine (ZANAFLEX) 4 MG tablet Take 1 tablet (4 mg total) by mouth every 6 (six) hours as needed for muscle spasms. 30 tablet 0    No current facility-administered medications for this visit.     Patient confirms/reports the following allergies:  Allergies  Allergen Reactions  . Penicillin G Benzathine     No orders of the defined types were placed in this encounter.   AUTHORIZATION INFORMATION Primary Insurance: 1D#: Group #:  Secondary Insurance: 1D#: Group #:  SCHEDULE INFORMATION: Date: 5/8 Time: Location: [place]

## 2016-06-07 ENCOUNTER — Telehealth: Payer: Self-pay | Admitting: Gastroenterology

## 2016-06-07 NOTE — Telephone Encounter (Signed)
06/07/16 Spoke with Abbie Sons with [employer] and NO prior auth is required for EGD & Colonoscopy 43235 & [id number] / R68.81 & R10.9

## 2016-06-12 ENCOUNTER — Telehealth: Payer: Self-pay

## 2016-06-12 NOTE — Telephone Encounter (Signed)
Per Ventura Bruns with [facility] Medicare, no prior authorization required for CPT Code [phone] Gastric Emptying Study. Case # [id number].

## 2016-06-19 ENCOUNTER — Other Ambulatory Visit: Payer: Self-pay | Admitting: Unknown Physician Specialty

## 2016-06-20 ENCOUNTER — Ambulatory Visit: Payer: Medicare Other

## 2016-06-25 ENCOUNTER — Ambulatory Visit: Payer: Medicare Other | Admitting: Anesthesiology

## 2016-06-25 ENCOUNTER — Ambulatory Visit
Admission: RE | Admit: 2016-06-25 | Discharge: 2016-06-25 | Disposition: A | Discharge status: Home / Self Care | Payer: Medicare Other | Source: Ambulatory Visit | Attending: Gastroenterology | Admitting: Gastroenterology

## 2016-06-25 ENCOUNTER — Encounter
Admission: RE | Disposition: A | Discharge status: Home / Self Care | Payer: Self-pay | Source: Ambulatory Visit | Attending: Gastroenterology

## 2016-06-25 DIAGNOSIS — Z880 Allergy status to penicillin: Secondary | ICD-10-CM | POA: Insufficient documentation

## 2016-06-25 DIAGNOSIS — E785 Hyperlipidemia, unspecified: Secondary | ICD-10-CM | POA: Diagnosis not present

## 2016-06-25 DIAGNOSIS — D125 Benign neoplasm of sigmoid colon: Secondary | ICD-10-CM | POA: Diagnosis not present

## 2016-06-25 DIAGNOSIS — Z79899 Other long term (current) drug therapy: Secondary | ICD-10-CM | POA: Insufficient documentation

## 2016-06-25 DIAGNOSIS — R634 Abnormal weight loss: Secondary | ICD-10-CM | POA: Diagnosis present

## 2016-06-25 DIAGNOSIS — I10 Essential (primary) hypertension: Secondary | ICD-10-CM | POA: Insufficient documentation

## 2016-06-25 DIAGNOSIS — D124 Benign neoplasm of descending colon: Secondary | ICD-10-CM | POA: Diagnosis not present

## 2016-06-25 DIAGNOSIS — Z7951 Long term (current) use of inhaled steroids: Secondary | ICD-10-CM | POA: Insufficient documentation

## 2016-06-25 DIAGNOSIS — R6881 Early satiety: Secondary | ICD-10-CM | POA: Insufficient documentation

## 2016-06-25 DIAGNOSIS — K219 Gastro-esophageal reflux disease without esophagitis: Secondary | ICD-10-CM | POA: Insufficient documentation

## 2016-06-25 DIAGNOSIS — Z7984 Long term (current) use of oral hypoglycemic drugs: Secondary | ICD-10-CM | POA: Diagnosis not present

## 2016-06-25 DIAGNOSIS — R109 Unspecified abdominal pain: Secondary | ICD-10-CM

## 2016-06-25 DIAGNOSIS — K319 Disease of stomach and duodenum, unspecified: Secondary | ICD-10-CM | POA: Diagnosis not present

## 2016-06-25 DIAGNOSIS — K2970 Gastritis, unspecified, without bleeding: Secondary | ICD-10-CM | POA: Diagnosis not present

## 2016-06-25 DIAGNOSIS — K640 First degree hemorrhoids: Secondary | ICD-10-CM

## 2016-06-25 DIAGNOSIS — E119 Type 2 diabetes mellitus without complications: Secondary | ICD-10-CM | POA: Diagnosis not present

## 2016-06-25 DIAGNOSIS — D123 Benign neoplasm of transverse colon: Secondary | ICD-10-CM | POA: Insufficient documentation

## 2016-06-25 DIAGNOSIS — K648 Other hemorrhoids: Secondary | ICD-10-CM | POA: Diagnosis not present

## 2016-06-25 DIAGNOSIS — M1990 Unspecified osteoarthritis, unspecified site: Secondary | ICD-10-CM | POA: Insufficient documentation

## 2016-06-25 DIAGNOSIS — Z7982 Long term (current) use of aspirin: Secondary | ICD-10-CM | POA: Diagnosis not present

## 2016-06-25 HISTORY — PX: COLONOSCOPY WITH PROPOFOL: SHX5780

## 2016-06-25 HISTORY — PX: ESOPHAGOGASTRODUODENOSCOPY (EGD) WITH PROPOFOL: SHX5813

## 2016-06-25 HISTORY — DX: Gastro-esophageal reflux disease without esophagitis: K21.9

## 2016-06-25 LAB — HM COLONOSCOPY

## 2016-06-25 LAB — GLUCOSE, CAPILLARY: Glucose-Capillary: 182 mg/dL — ABNORMAL HIGH (ref 65–99)

## 2016-06-25 SURGERY — COLONOSCOPY WITH PROPOFOL
Anesthesia: General

## 2016-06-25 MED ORDER — ONDANSETRON HCL 4 MG/2ML IJ SOLN
4.0000 mg | Freq: Once | INTRAMUSCULAR | Status: AC
  Administered 2016-06-25: 4 mg via INTRAVENOUS

## 2016-06-25 MED ORDER — PHENYLEPHRINE HCL 10 MG/ML IJ SOLN
INTRAMUSCULAR | Status: DC | PRN
  Administered 2016-06-25: 200 ug via INTRAVENOUS
  Administered 2016-06-25: 100 ug via INTRAVENOUS
  Administered 2016-06-25: 200 ug via INTRAVENOUS
  Administered 2016-06-25: 150 ug via INTRAVENOUS

## 2016-06-25 MED ORDER — PROPOFOL 500 MG/50ML IV EMUL
INTRAVENOUS | Status: DC | PRN
  Administered 2016-06-25: 150 ug/kg/min via INTRAVENOUS

## 2016-06-25 MED ORDER — MIDAZOLAM HCL 5 MG/5ML IJ SOLN
INTRAMUSCULAR | Status: DC | PRN
  Administered 2016-06-25 (×2): 1 mg via INTRAVENOUS

## 2016-06-25 MED ORDER — FENTANYL CITRATE (PF) 100 MCG/2ML IJ SOLN
INTRAMUSCULAR | Status: DC | PRN
  Administered 2016-06-25: 50 ug via INTRAVENOUS
  Administered 2016-06-25: 25 ug via INTRAVENOUS

## 2016-06-25 MED ORDER — ONDANSETRON HCL 4 MG/2ML IJ SOLN
INTRAMUSCULAR | Status: AC
  Filled 2016-06-25: qty 2

## 2016-06-25 MED ORDER — MIDAZOLAM HCL 2 MG/2ML IJ SOLN
INTRAMUSCULAR | Status: AC
  Filled 2016-06-25: qty 2

## 2016-06-25 MED ORDER — FENTANYL CITRATE (PF) 100 MCG/2ML IJ SOLN
INTRAMUSCULAR | Status: AC
Start: 2016-06-25 — End: 2016-06-25
  Filled 2016-06-25: qty 2

## 2016-06-25 MED ORDER — SODIUM CHLORIDE 0.9 % IV SOLN
INTRAVENOUS | Status: DC
  Administered 2016-06-25: 10:00:00 via INTRAVENOUS

## 2016-06-25 MED ORDER — PROPOFOL 500 MG/50ML IV EMUL
INTRAVENOUS | Status: AC
  Filled 2016-06-25: qty 50

## 2016-06-25 NOTE — Transfer of Care (Signed)
Immediate Anesthesia Transfer of Care Note  Patient: Cheryl Mendez  Procedure(s) Performed: Procedure(s): COLONOSCOPY WITH PROPOFOL (N/A) ESOPHAGOGASTRODUODENOSCOPY (EGD) WITH PROPOFOL (N/A)  Patient Location: PACU and Endoscopy Unit  Anesthesia Type:General  Level of Consciousness: awake, alert  and oriented  Airway & Oxygen Therapy: Patient Spontanous Breathing and Patient connected to nasal cannula oxygen  Post-op Assessment: Report given to RN and Post -op Vital signs reviewed and stable  Post vital signs: Reviewed and stable  Last Vitals:  Vitals:   06/25/16 1110 06/25/16 1114  BP: (!) 68/43   Pulse: 98   Resp: 17   Temp: 36.6 C 36.6 C    Last Pain:  Vitals:   06/25/16 1114  TempSrc: Tympanic         Complications: No apparent anesthesia complications

## 2016-06-25 NOTE — H&P (Signed)
Jonathon Bellows MD [address], [place], [place] [address] Phone: [phone] Fax : [phone]  Primary Care Physician:  Kathrine Haddock, NP Primary Gastroenterologist:  Dr. Jonathon Bellows   Pre-Procedure History & Physical: HPI:  Cheryl Mendez is a 66 y.o. female is here for an endoscopy and colonoscopy.   Past Medical History:  Diagnosis Date  . Allergy   . Arthritis   . Diabetes mellitus without complication ([place])   . GERD (gastroesophageal reflux disease)   . Hyperlipidemia   . Hypertension   . Lumbago   . Migraine     Past Surgical History:  Procedure Laterality Date  . NO PAST SURGERIES      Prior to Admission medications   Medication Sig Start Date End Date Taking? Authorizing Provider  aspirin 81 MG tablet Take 81 mg by mouth daily.   Yes [provider]  benazepril-hydrochlorthiazide (LOTENSIN HCT) 20-25 MG tablet TAKE 1 TABLET BY MOUTH DAILY 06/19/16  Yes Kathrine Haddock, NP  metFORMIN (GLUCOPHAGE) 1000 MG tablet Take 1 tablet (1,000 mg total) by mouth 2 (two) times daily with a meal. 10/17/15  Yes Kathrine Haddock, NP  albuterol (PROVENTIL HFA;VENTOLIN HFA) 108 (90 Base) MCG/ACT inhaler Inhale 2 puffs into the lungs every 6 (six) hours as needed for wheezing or shortness of breath. Patient not taking: Reported on 01/19/2016 04/07/15   Kathrine Haddock, NP  glipiZIDE (GLUCOTROL XL) 10 MG 24 hr tablet Take 1 tablet (10 mg total) by mouth daily with breakfast. 01/19/16   Kathrine Haddock, NP  glucose blood (BAYER CONTOUR NEXT TEST) test strip Use daily 01/06/15   Kathrine Haddock, NP  loratadine (CLARITIN) 10 MG tablet Take 10 mg by mouth daily as needed. 10/03/14   [provider]  omeprazole (PRILOSEC) 20 MG capsule Take 1 capsule (20 mg total) by mouth daily. 10/17/15   Kathrine Haddock, NP  tiZANidine (ZANAFLEX) 4 MG tablet Take 1 tablet (4 mg total) by mouth every 6 (six) hours as needed for muscle spasms. Patient not taking: Reported on 06/25/2016 10/17/15    Kathrine Haddock, NP    Allergies as of 06/05/2016 - Review Complete 06/05/2016  Allergen Reaction Noted  . Penicillin g benzathine  11/01/2014    Family History  Problem Relation Age of Onset  . Diabetes Mother   . Heart disease Father   . Hypertension Father   . Eczema Sister   . COPD Sister     Social History   Social History  . Marital status: Widowed    Spouse name: N/A  . Number of children: N/A  . Years of education: N/A   Occupational History  . Not on file.   Social History Main Topics  . Smoking status: Never Smoker  . Smokeless tobacco: Never Used  . Alcohol use No  . Drug use: No  . Sexual activity: No   Other Topics Concern  . Not on file   Social History Narrative  . No narrative on file    Review of Systems: See HPI, otherwise negative ROS  Physical Exam: BP 112/67   Pulse 97   Temp 97.6 F (36.4 C) (Tympanic)   Resp 16   Ht 5' 4.5" (1.638 m)   Wt 180 lb (81.6 kg)   LMP  (LMP Unknown)   SpO2 97%   BMI 30.42 kg/m  General:   Alert,  pleasant and cooperative in NAD Head:  Normocephalic and atraumatic. Neck:  Supple; no masses or thyromegaly. Lungs:  Clear throughout  to auscultation.    Heart:  Regular rate and rhythm. Abdomen:  Soft, nontender and nondistended. Normal bowel sounds, without guarding, and without rebound.   Neurologic:  Alert and  oriented x4;  grossly normal neurologically.  Impression/Plan: Cheryl Mendez is here for an endoscopy and colonoscopy to be performed for weight loss, early satiety and abdominal pain   Risks, benefits, limitations, and alternatives regarding  endoscopy and colonoscopy have been reviewed with the patient.  Questions have been answered.  All parties agreeable.   Jonathon Bellows, MD  06/25/2016, 10:20 AM

## 2016-06-25 NOTE — Op Note (Signed)
[facility] Patient Name: Jerri Glauser Procedure Date: 06/25/2016 10:17 AM MRN: [id number] Account #: [id number] Date of Birth: 01/12/1951 Admit Type: Outpatient Age: 66 Room: [facility] ENDO ROOM 1 Gender: Female Note Status: Finalized Procedure:            Colonoscopy Indications:          Weight loss Providers:            Jonathon Bellows MD, MD Medicines:            Monitored Anesthesia Care Complications:        No immediate complications. Procedure:            Pre-Anesthesia Assessment:                       - Prior to the procedure, a History and Physical was                        performed, and patient medications, allergies and                        sensitivities were reviewed. The patient's tolerance of                        previous anesthesia was reviewed.                       - The risks and benefits of the procedure and the                        sedation options and risks were discussed with the                        patient. All questions were answered and informed                        consent was obtained.                       - ASA Grade Assessment: III - A patient with severe                        systemic disease.                       After obtaining informed consent, the colonoscope was                        passed under direct vision. Throughout the procedure,                        the patient's blood pressure, pulse, and oxygen                        saturations were monitored continuously. The                        Colonoscope was introduced through the anus and                        advanced to the the cecum, identified by the  appendiceal orifice, IC valve and transillumination.                        The colonoscopy was performed with ease. The patient                        tolerated the procedure well. The quality of the bowel                        preparation was good. Findings:      Three  sessile polyps were found in the sigmoid colon, descending colon       and hepatic flexure. The polyps were 5 to 7 mm in size. These polyps       were removed with a cold snare. Resection and retrieval were complete.      Internal hemorrhoids were found during retroflexion. The hemorrhoids       were small and Grade I (internal hemorrhoids that do not prolapse).      The exam was otherwise without abnormality on direct and retroflexion       views. Impression:           - Three 5 to 7 mm polyps in the sigmoid colon, in the                        descending colon and at the hepatic flexure, removed                        with a cold snare. Resected and retrieved.                       - Internal hemorrhoids.                       - The examination was otherwise normal on direct and                        retroflexion views. Recommendation:       - Discharge patient to home (with escort).                       - Resume previous diet.                       - Continue present medications.                       - Await pathology results.                       - Repeat colonoscopy for surveillance based on                        pathology results.                       - Return to GI office in 4 weeks. Procedure Code(s):    --- Professional ---                       [phone], Colonoscopy, flexible; with removal of tumor(s),  polyp(s), or other lesion(s) by snare technique Diagnosis Code(s):    --- Professional ---                       D12.5, Benign neoplasm of sigmoid colon                       D12.4, Benign neoplasm of descending colon                       D12.3, Benign neoplasm of transverse colon (hepatic                        flexure or splenic flexure)                       K64.0, First degree hemorrhoids                       R63.4, Abnormal weight loss CPT copyright 2016 American Medical Association. All rights reserved. The codes documented in this report are  preliminary and upon coder review may  be revised to meet current compliance requirements. Jonathon Bellows, MD Jonathon Bellows MD, MD 06/25/2016 11:02:29 AM This report has been signed electronically. Number of Addenda: 0 Note Initiated On: 06/25/2016 10:17 AM Scope Withdrawal Time: 0 hours 15 minutes 11 seconds  Total Procedure Duration: 0 hours 18 minutes 21 seconds       [facility]

## 2016-06-25 NOTE — Anesthesia Postprocedure Evaluation (Signed)
Anesthesia Post Note  Patient: Cheryl Mendez  Procedure(s) Performed: Procedure(s) (LRB): COLONOSCOPY WITH PROPOFOL (N/A) ESOPHAGOGASTRODUODENOSCOPY (EGD) WITH PROPOFOL (N/A)  Patient location during evaluation: Endoscopy Anesthesia Type: General Level of consciousness: awake and alert Pain management: pain level controlled Vital Signs Assessment: post-procedure vital signs reviewed and stable Respiratory status: spontaneous breathing, nonlabored ventilation, respiratory function stable and patient connected to nasal cannula oxygen Cardiovascular status: blood pressure returned to baseline and stable Postop Assessment: no signs of nausea or vomiting Anesthetic complications: no     Last Vitals:  Vitals:   06/25/16 1140 06/25/16 1150  BP: 96/75 96/75  Pulse: 86 83  Resp: (!) 32 16  Temp:      Last Pain:  Vitals:   06/25/16 1114  TempSrc: Tympanic                 Makena Mcgrady S

## 2016-06-25 NOTE — Anesthesia Post-op Follow-up Note (Cosign Needed)
Anesthesia QCDR form completed.        

## 2016-06-25 NOTE — Op Note (Signed)
[facility] Patient Name: Cheryl Mendez Procedure Date: 06/25/2016 10:17 AM MRN: [id number] Account #: [id number] Date of Birth: 1950/08/15 Admit Type: Outpatient Age: 66 Room: [facility] ENDO ROOM 1 Gender: Female Note Status: Finalized Procedure:            Upper GI endoscopy Indications:          Early satiety Providers:            Jonathon Bellows MD, MD Referring MD:         Kathrine Haddock (Referring MD) Medicines:            Monitored Anesthesia Care Complications:        No immediate complications. Procedure:            Pre-Anesthesia Assessment:                       - Prior to the procedure, a History and Physical was                        performed, and patient medications, allergies and                        sensitivities were reviewed. The patient's tolerance of                        previous anesthesia was reviewed.                       - The risks and benefits of the procedure and the                        sedation options and risks were discussed with the                        patient. All questions were answered and informed                        consent was obtained.                       - ASA Grade Assessment: III - A patient with severe                        systemic disease.                       After obtaining informed consent, the endoscope was                        passed under direct vision. Throughout the procedure,                        the patient's blood pressure, pulse, and oxygen                        saturations were monitored continuously. The Endoscope                        was introduced through the mouth, and advanced to the  third part of duodenum. The upper GI endoscopy was                        accomplished with ease. The patient tolerated the                        procedure well. Findings:      The esophagus was normal.      The examined duodenum was normal.      Patchy mild  inflammation characterized by congestion (edema) and       erythema was found in the gastric antrum. Biopsies were taken with a       cold forceps for histology. Impression:           - Normal esophagus.                       - Normal examined duodenum.                       - Gastritis. Biopsied. Recommendation:       - Await pathology results.                       - Perform a colonoscopy tomorrow.                       - No ibuprofen, naproxen, or other non-steroidal                        anti-inflammatory drugs for 12 weeks. Procedure Code(s):    --- Professional ---                       [phone], Esophagogastroduodenoscopy, flexible, transoral;                        with biopsy, single or multiple Diagnosis Code(s):    --- Professional ---                       K29.70, Gastritis, unspecified, without bleeding                       R68.81, Early satiety CPT copyright 2016 American Medical Association. All rights reserved. The codes documented in this report are preliminary and upon coder review may  be revised to meet current compliance requirements. Jonathon Bellows, MD Jonathon Bellows MD, MD 06/25/2016 10:40:58 AM This report has been signed electronically. Number of Addenda: 0 Note Initiated On: 06/25/2016 10:17 AM      [facility]

## 2016-06-25 NOTE — OR Nursing (Signed)
PT NAUSEATED AND HAS BEEN VOMITING SINCE SECOND PREP . RN SPOKE WITH DR [place] 4MG  IV ONCE

## 2016-06-25 NOTE — Anesthesia Preprocedure Evaluation (Signed)
Anesthesia Evaluation  Patient identified by MRN, date of birth, ID band Patient awake    Reviewed: Allergy & Precautions, NPO status , Patient's Chart, lab work & pertinent test results, reviewed documented beta blocker date and time   Airway Mallampati: II  TM Distance: >3 FB     Dental  (+) Chipped   Pulmonary           Cardiovascular hypertension, Pt. on medications      Neuro/Psych  Headaches,    GI/Hepatic GERD  ,  Endo/Other  diabetes, Type 2  Renal/GU      Musculoskeletal  (+) Arthritis ,   Abdominal   Peds  Hematology   Anesthesia Other Findings   Reproductive/Obstetrics                             Anesthesia Physical Anesthesia Plan  ASA: III  Anesthesia Plan: General   Post-op Pain Management:    Induction: Intravenous  Airway Management Planned:   Additional Equipment:   Intra-op Plan:   Post-operative Plan:   Informed Consent: I have reviewed the patients History and Physical, chart, labs and discussed the procedure including the risks, benefits and alternatives for the proposed anesthesia with the patient or authorized representative who has indicated his/her understanding and acceptance.     Plan Discussed with: CRNA  Anesthesia Plan Comments:         Anesthesia Quick Evaluation

## 2016-06-26 ENCOUNTER — Encounter: Payer: Self-pay | Admitting: Gastroenterology

## 2016-06-27 LAB — SURGICAL PATHOLOGY

## 2016-07-01 ENCOUNTER — Encounter: Payer: Self-pay | Admitting: Gastroenterology

## 2016-07-04 ENCOUNTER — Ambulatory Visit
Admission: RE | Admit: 2016-07-04 | Discharge: 2016-07-04 | Disposition: A | Discharge status: Home / Self Care | Payer: Medicare Other | Source: Ambulatory Visit | Attending: Gastroenterology | Admitting: Gastroenterology

## 2016-07-04 DIAGNOSIS — K3184 Gastroparesis: Secondary | ICD-10-CM

## 2016-07-04 MED ORDER — TECHNETIUM TC 99M SULFUR COLLOID
2.0000 | Freq: Once | INTRAVENOUS | Status: AC | PRN
  Administered 2016-07-04: 2.24 via INTRAVENOUS

## 2016-07-05 ENCOUNTER — Telehealth: Payer: Self-pay

## 2016-07-05 NOTE — Telephone Encounter (Signed)
Advised patient of results per Dr. Vicente Males.   Normal study

## 2016-07-05 NOTE — Telephone Encounter (Signed)
-----   Message from Jonathon Bellows, MD sent at 07/04/2016 12:43 PM EDT ----- Normal study

## 2016-08-05 ENCOUNTER — Encounter: Payer: Self-pay | Admitting: Unknown Physician Specialty

## 2016-08-05 ENCOUNTER — Ambulatory Visit (INDEPENDENT_AMBULATORY_CARE_PROVIDER_SITE_OTHER): Payer: Medicare Other | Admitting: Unknown Physician Specialty

## 2016-08-05 VITALS — BP 136/86 | HR 92 | Temp 98.4°F | Wt 180.6 lb

## 2016-08-05 DIAGNOSIS — E1165 Type 2 diabetes mellitus with hyperglycemia: Secondary | ICD-10-CM

## 2016-08-05 DIAGNOSIS — I10 Essential (primary) hypertension: Secondary | ICD-10-CM

## 2016-08-05 DIAGNOSIS — E782 Mixed hyperlipidemia: Secondary | ICD-10-CM | POA: Diagnosis not present

## 2016-08-05 LAB — BAYER DCA HB A1C WAIVED: HB A1C: 7.3 % — AB (ref <7.0)

## 2016-08-05 NOTE — Progress Notes (Signed)
   BP 136/86   Pulse 92   Temp 98.4 F (36.9 C)   Wt 180 lb 9.6 oz (81.9 kg)   LMP  (LMP Unknown) Comment: age 65  SpO2 98%   BMI 30.52 kg/m    Subjective:    Patient ID: Cheryl Mendez, female    DOB: 11-19-1950, 66 y.o.   MRN: [id number]  HPI: Cheryl Mendez is a 66 y.o. female  Chief Complaint  Patient presents with  . Diabetes    pt states that she knows she is due for an eye exam  . Hyperlipidemia  . Hypertension   Diabetes: Using medications without difficulties.  Stopped Glyburide due to tests.   No hypoglycemic episodes No hyperglycemic episodes Feet problems:none Blood Sugars averaging: Not checking Last Hgb A1C: 7.2  Hypertension  Using medications without difficulty Average home BPs Not checking   Using medication without problems or lightheadedness No chest pain with exertion or shortness of breath No Edema  Elevated Cholesterol Using medications without problems No Muscle aches  Diet: Exercise: Trying to walk  Relevant past medical, surgical, family and social history reviewed and updated as indicated. Interim medical history since our last visit reviewed. Allergies and medications reviewed and updated.  Review of Systems  Per HPI unless specifically indicated above     Objective:    BP 136/86   Pulse 92   Temp 98.4 F (36.9 C)   Wt 180 lb 9.6 oz (81.9 kg)   LMP  (LMP Unknown) Comment: age 53  SpO2 98%   BMI 30.52 kg/m   Wt Readings from Last 3 Encounters:  08/05/16 180 lb 9.6 oz (81.9 kg)  06/25/16 180 lb (81.6 kg)  06/05/16 181 lb (82.1 kg)    Physical Exam  Constitutional: She is oriented to person, place, and time. She appears well-developed and well-nourished. No distress.  HENT:  Head: Normocephalic and atraumatic.  Eyes: Conjunctivae and lids are normal. Right eye exhibits no discharge. Left eye exhibits no discharge. No scleral icterus.  Neck: Normal range of motion. Neck supple. No JVD present. Carotid bruit is not present.   Cardiovascular: Normal rate, regular rhythm and normal heart sounds.   Pulmonary/Chest: Effort normal and breath sounds normal.  Abdominal: Normal appearance. There is no splenomegaly or hepatomegaly.  Musculoskeletal: Normal range of motion.  Neurological: She is alert and oriented to person, place, and time.  Skin: Skin is warm, dry and intact. No rash noted. No pallor.  Psychiatric: She has a normal mood and affect. Her behavior is normal. Judgment and thought content normal.      Assessment & Plan:   Problem List Items Addressed This Visit      Unprioritized   Hyperlipidemia    Refusing statins      Hypertension    Stable, continue present medications.        Poorly controlled type 2 diabetes mellitus (HCC) - Primary    Hgb A1C is 7.3% off of Glipizide and feels better.  She wants to work on diet and exercise so will DC Glipizide and recheck in 3 months      Relevant Orders   Comprehensive metabolic panel   Bayer DCA Hb A1c Waived       Follow up plan: Return in about 3 months (around 11/05/2016).

## 2016-08-05 NOTE — Assessment & Plan Note (Signed)
Stable, continue present medications.   

## 2016-08-05 NOTE — Assessment & Plan Note (Signed)
Refusing statins 

## 2016-08-05 NOTE — Assessment & Plan Note (Signed)
Hgb A1C is 7.3% off of Glipizide and feels better.  She wants to work on diet and exercise so will DC Glipizide and recheck in 3 months

## 2016-08-06 LAB — COMPREHENSIVE METABOLIC PANEL
A/G RATIO: 1.6 (ref 1.2–2.2)
ALT: 67 IU/L — ABNORMAL HIGH (ref 0–32)
AST: 50 IU/L — ABNORMAL HIGH (ref 0–40)
Albumin: 4.2 g/dL (ref 3.6–4.8)
Alkaline Phosphatase: 53 IU/L (ref 39–117)
BUN/Creatinine Ratio: 18 (ref 12–28)
BUN: 12 mg/dL (ref 8–27)
Bilirubin Total: 0.4 mg/dL (ref 0.0–1.2)
CALCIUM: 10.3 mg/dL (ref 8.7–10.3)
CO2: 23 mmol/L (ref 20–29)
Chloride: 95 mmol/L — ABNORMAL LOW (ref 96–106)
Creatinine, Ser: 0.66 mg/dL (ref 0.57–1.00)
GFR, EST AFRICAN AMERICAN: 106 mL/min/{1.73_m2} (ref >59)
GFR, EST NON AFRICAN AMERICAN: 92 mL/min/{1.73_m2} (ref >59)
GLOBULIN, TOTAL: 2.7 g/dL (ref 1.5–4.5)
Glucose: 218 mg/dL — ABNORMAL HIGH (ref 65–99)
POTASSIUM: 4.4 mmol/L (ref 3.5–5.2)
SODIUM: 136 mmol/L (ref 134–144)
TOTAL PROTEIN: 6.9 g/dL (ref 6.0–8.5)

## 2016-09-23 ENCOUNTER — Other Ambulatory Visit: Payer: Self-pay | Admitting: Unknown Physician Specialty

## 2016-10-19 ENCOUNTER — Other Ambulatory Visit: Payer: Self-pay | Admitting: Unknown Physician Specialty

## 2016-10-23 ENCOUNTER — Telehealth: Payer: Self-pay | Admitting: Unknown Physician Specialty

## 2016-10-23 NOTE — Telephone Encounter (Signed)
According to chart, prescription was sent in yesterday morning to [facility] in [place] and let patient know that it should already be ready for her at the pharmacy. Asked for patient to please give me a call if she needs anything else from [place].

## 2016-10-30 ENCOUNTER — Other Ambulatory Visit: Payer: Self-pay | Admitting: Unknown Physician Specialty

## 2016-10-30 MED ORDER — LORATADINE 10 MG PO TABS: 10.0000 mg | ORAL_TABLET | Freq: Every day | ORAL | 4 refills | Status: DC | PRN

## 2016-10-30 NOTE — Telephone Encounter (Signed)
Routing to provider. Patient last seen 08/05/16.

## 2016-11-04 MED ORDER — LORATADINE 10 MG PO TABS: 10.0000 mg | ORAL_TABLET | Freq: Every day | ORAL | 3 refills | Status: DC

## 2016-11-04 NOTE — Telephone Encounter (Signed)
Cheryl: Can you please send this to [facility].

## 2016-11-04 NOTE — Addendum Note (Signed)
Addended by: Kathrine Haddock on: 11/04/2016 03:08 PM   Modules accepted: Orders

## 2016-11-04 NOTE — Telephone Encounter (Signed)
Patient needs her loratadine 10mg  refilled and it needs to be sent to [facility] in [place] if any questions call Tanette  Thank You

## 2016-11-08 ENCOUNTER — Ambulatory Visit (INDEPENDENT_AMBULATORY_CARE_PROVIDER_SITE_OTHER): Payer: Medicare Other | Admitting: Unknown Physician Specialty

## 2016-11-08 ENCOUNTER — Encounter: Payer: Self-pay | Admitting: Unknown Physician Specialty

## 2016-11-08 VITALS — BP 135/85 | HR 85 | Temp 98.3°F | Wt 180.0 lb

## 2016-11-08 DIAGNOSIS — I10 Essential (primary) hypertension: Secondary | ICD-10-CM

## 2016-11-08 DIAGNOSIS — Z23 Encounter for immunization: Secondary | ICD-10-CM

## 2016-11-08 DIAGNOSIS — E1165 Type 2 diabetes mellitus with hyperglycemia: Secondary | ICD-10-CM

## 2016-11-08 DIAGNOSIS — E782 Mixed hyperlipidemia: Secondary | ICD-10-CM

## 2016-11-08 LAB — LIPID PANEL PICCOLO, WAIVED
CHOL/HDL RATIO PICCOLO,WAIVE: 5.3 mg/dL — AB
Cholesterol Piccolo, Waived: 254 mg/dL — ABNORMAL HIGH (ref <200)
HDL Chol Piccolo, Waived: 48 mg/dL — ABNORMAL LOW (ref >59)
LDL Chol Calc Piccolo Waived: 138 mg/dL — ABNORMAL HIGH (ref <100)
TRIGLYCERIDES PICCOLO,WAIVED: 343 mg/dL — AB (ref <150)
VLDL CHOL CALC PICCOLO,WAIVE: 69 mg/dL — AB (ref <30)

## 2016-11-08 LAB — BAYER DCA HB A1C WAIVED: HB A1C (BAYER DCA - WAIVED): 8.5 % — ABNORMAL HIGH (ref <7.0)

## 2016-11-08 NOTE — Assessment & Plan Note (Signed)
Not to goal at 8.5% and worsening.  Pt will restart Metformin in 3 months.  Planning on watching herself more

## 2016-11-08 NOTE — Assessment & Plan Note (Signed)
LDL 135.  Refusing statins.  Guidelines reviewed.

## 2016-11-08 NOTE — Patient Instructions (Signed)

## 2016-11-08 NOTE — Progress Notes (Signed)
BP 135/85   Pulse 85   Temp 98.3 F (36.8 C)   Wt 180 lb (81.6 kg)   LMP  (LMP Unknown) Comment: age 67  SpO2 95%   BMI 30.42 kg/m    Subjective:    Patient ID: Cheryl Mendez, female    DOB: 09/25/1950, 66 y.o.   MRN: [id number]  HPI: Cheryl Mendez is a 66 y.o. female  Chief Complaint  Patient presents with  . Diabetes    pt going to get PPSV 23 next visit and states that last eye exam in chart is correct   . Hyperlipidemia  . Hypertension   Diabetes: States Metformin was taken 1 per day recently No hypoglycemic episodes No hyperglycemic episodes Feet problems: none Blood Sugars averaging: eye exam within last year: No and is planning to go Last Hgb A1C: 7.3  Hypertension  Using medications without difficulty Average home BPs Not checking   Using medication without problems or lightheadedness No chest pain with exertion or shortness of breath No Edema  Elevated Cholesterol Using medications without problems No Muscle aches  Diet: Exercise:Eating a lot of fruit  Relevant past medical, surgical, family and social history reviewed and updated as indicated. Interim medical history since our last visit reviewed. Allergies and medications reviewed and updated.  Review of Systems  Per HPI unless specifically indicated above     Objective:    BP 135/85   Pulse 85   Temp 98.3 F (36.8 C)   Wt 180 lb (81.6 kg)   LMP  (LMP Unknown) Comment: age 83  SpO2 95%   BMI 30.42 kg/m   Wt Readings from Last 3 Encounters:  11/08/16 180 lb (81.6 kg)  08/05/16 180 lb 9.6 oz (81.9 kg)  06/25/16 180 lb (81.6 kg)    Physical Exam  Constitutional: She is oriented to person, place, and time. She appears well-developed and well-nourished. No distress.  HENT:  Head: Normocephalic and atraumatic.  Eyes: Conjunctivae and lids are normal. Right eye exhibits no discharge. Left eye exhibits no discharge. No scleral icterus.  Neck: Normal range of motion. Neck supple. No JVD  present. Carotid bruit is not present.  Cardiovascular: Normal rate, regular rhythm and normal heart sounds.   Pulmonary/Chest: Effort normal and breath sounds normal.  Abdominal: Normal appearance. There is no splenomegaly or hepatomegaly.  Musculoskeletal: Normal range of motion.  Neurological: She is alert and oriented to person, place, and time.  Skin: Skin is warm, dry and intact. No rash noted. No pallor.  Psychiatric: She has a normal mood and affect. Her behavior is normal. Judgment and thought content normal.    Results for orders placed or performed in visit on 08/05/16  Comprehensive metabolic panel  Result Value Ref Range   Glucose 218 (H) 65 - 99 mg/dL   BUN 12 8 - 27 mg/dL   Creatinine, Ser 0.66 0.57 - 1.00 mg/dL   GFR calc non Af Amer 92 >59 mL/min/1.73   GFR calc Af Amer 106 >59 mL/min/1.73   BUN/Creatinine Ratio 18 12 - 28   Sodium 136 134 - 144 mmol/L   Potassium 4.4 3.5 - 5.2 mmol/L   Chloride 95 (L) 96 - 106 mmol/L   CO2 23 20 - 29 mmol/L   Calcium 10.3 8.7 - 10.3 mg/dL   Total Protein 6.9 6.0 - 8.5 g/dL   Albumin 4.2 3.6 - 4.8 g/dL   Globulin, Total 2.7 1.5 - 4.5 g/dL   Albumin/Globulin Ratio 1.6 1.2 -  2.2   Bilirubin Total 0.4 0.0 - 1.2 mg/dL   Alkaline Phosphatase 53 39 - 117 IU/L   AST 50 (H) 0 - 40 IU/L   ALT 67 (H) 0 - 32 IU/L  Bayer DCA Hb A1c Waived  Result Value Ref Range   Bayer DCA Hb A1c Waived 7.3 (H) <7.0 %      Assessment & Plan:   Problem List Items Addressed This Visit      Unprioritized   Hyperlipidemia    LDL 135.  Refusing statins.  Guidelines reviewed.        Relevant Orders   Lipid Panel [place], Waived   Hypertension    Stable, continue present medications.        Poorly controlled type 2 diabetes mellitus ([place])    Not to goal at 8.5% and worsening.  Pt will restart Metformin in 3 months.  Planning on watching herself more      Relevant Orders   Comprehensive metabolic panel   Bayer DCA Hb A1c Waived    Other  Visit Diagnoses    Need for influenza vaccination    -  Primary   Relevant Orders   Flu vaccine HIGH DOSE PF (Completed)       Follow up plan: Return in about 3 months (around 02/07/2017).

## 2016-11-08 NOTE — Assessment & Plan Note (Signed)
Stable, continue present medications.   

## 2016-11-09 LAB — COMPREHENSIVE METABOLIC PANEL
ALBUMIN: 4.3 g/dL (ref 3.6–4.8)
ALK PHOS: 50 IU/L (ref 39–117)
ALT: 117 IU/L — ABNORMAL HIGH (ref 0–32)
AST: 102 IU/L — AB (ref 0–40)
Albumin/Globulin Ratio: 1.5 (ref 1.2–2.2)
BILIRUBIN TOTAL: 0.5 mg/dL (ref 0.0–1.2)
BUN / CREAT RATIO: 21 (ref 12–28)
BUN: 12 mg/dL (ref 8–27)
CHLORIDE: 95 mmol/L — AB (ref 96–106)
CO2: 24 mmol/L (ref 20–29)
Calcium: 9.9 mg/dL (ref 8.7–10.3)
Creatinine, Ser: 0.58 mg/dL (ref 0.57–1.00)
GFR calc Af Amer: 111 mL/min/{1.73_m2} (ref >59)
GFR calc non Af Amer: 96 mL/min/{1.73_m2} (ref >59)
GLUCOSE: 220 mg/dL — AB (ref 65–99)
Globulin, Total: 2.8 g/dL (ref 1.5–4.5)
Potassium: 4.3 mmol/L (ref 3.5–5.2)
Sodium: 138 mmol/L (ref 134–144)
Total Protein: 7.1 g/dL (ref 6.0–8.5)

## 2016-12-25 ENCOUNTER — Other Ambulatory Visit: Payer: Self-pay | Admitting: Unknown Physician Specialty

## 2017-02-13 ENCOUNTER — Encounter: Payer: Self-pay | Admitting: Unknown Physician Specialty

## 2017-03-08 ENCOUNTER — Other Ambulatory Visit: Payer: Self-pay | Admitting: Unknown Physician Specialty

## 2017-03-10 ENCOUNTER — Ambulatory Visit: Payer: Medicare Other | Admitting: Unknown Physician Specialty

## 2017-03-17 ENCOUNTER — Ambulatory Visit: Payer: Medicare Other | Admitting: Unknown Physician Specialty

## 2017-04-01 ENCOUNTER — Other Ambulatory Visit: Payer: Self-pay | Admitting: Unknown Physician Specialty

## 2017-04-23 ENCOUNTER — Ambulatory Visit: Payer: Medicare Other | Admitting: Unknown Physician Specialty

## 2017-05-07 ENCOUNTER — Other Ambulatory Visit: Payer: Self-pay

## 2017-05-07 ENCOUNTER — Ambulatory Visit (INDEPENDENT_AMBULATORY_CARE_PROVIDER_SITE_OTHER): Payer: Medicare Other | Admitting: Unknown Physician Specialty

## 2017-05-07 ENCOUNTER — Encounter: Payer: Self-pay | Admitting: Unknown Physician Specialty

## 2017-05-07 VITALS — BP 145/100 | HR 101 | Temp 99.2°F | Wt 176.4 lb

## 2017-05-07 DIAGNOSIS — E782 Mixed hyperlipidemia: Secondary | ICD-10-CM

## 2017-05-07 DIAGNOSIS — Z23 Encounter for immunization: Secondary | ICD-10-CM

## 2017-05-07 DIAGNOSIS — E1165 Type 2 diabetes mellitus with hyperglycemia: Secondary | ICD-10-CM

## 2017-05-07 DIAGNOSIS — R748 Abnormal levels of other serum enzymes: Secondary | ICD-10-CM | POA: Diagnosis not present

## 2017-05-07 DIAGNOSIS — L821 Other seborrheic keratosis: Secondary | ICD-10-CM | POA: Insufficient documentation

## 2017-05-07 DIAGNOSIS — I10 Essential (primary) hypertension: Secondary | ICD-10-CM | POA: Diagnosis not present

## 2017-05-07 LAB — BAYER DCA HB A1C WAIVED: HB A1C (BAYER DCA - WAIVED): 8.7 % — ABNORMAL HIGH (ref <7.0)

## 2017-05-07 MED ORDER — ATORVASTATIN CALCIUM 10 MG PO TABS: 10.0000 mg | ORAL_TABLET | Freq: Every day | ORAL | 3 refills | Status: DC

## 2017-05-07 MED ORDER — DULAGLUTIDE 0.75 MG/0.5ML ~~LOC~~ SOAJ: 0.7500 mg | SUBCUTANEOUS | 2 refills | Status: DC

## 2017-05-07 NOTE — Assessment & Plan Note (Signed)
Bothering her.  She would like to have them removed.  Refer to Dermatology

## 2017-05-07 NOTE — Assessment & Plan Note (Signed)
Willing to try a statin.  Start Atorvastatin 10 mg.

## 2017-05-07 NOTE — Assessment & Plan Note (Addendum)
Hgb A1C 8.7%.  No improvement.  She cannot afford expensive medications.  Start Trulicity .75 mg.  First dose in office.  Continue with Metformin

## 2017-05-07 NOTE — Progress Notes (Signed)
BP (!) 145/100   Pulse (!) 101   Temp 99.2 F (37.3 C) (Oral)   Wt 176 lb 6.4 oz (80 kg)   LMP  (LMP Unknown) Comment: age 67  SpO2 95%   BMI 29.81 kg/m    Subjective:    Patient ID: Cheryl Mendez, female    DOB: 29-Nov-1950, 67 y.o.   MRN: [id number]  HPI: Cheryl Mendez is a 67 y.o. female  Chief Complaint  Patient presents with  . Diabetes  . Gastroesophageal Reflux    pt states she would like the omeprazole changed to nexium  . Hypertension   Diabetes: Pt states she is watching what she eats and losing weight.  Restarted Metormin last visit and doing OK with it.   No hypoglycemic episodes No hyperglycemic episodes Feet problems: none Blood Sugars averaging: Not checking eye exam within last year Last Hgb A1C: 8.5%  Hypertension  States she had been out of her BP medication a couple of times.  Average home BPs Not checking   Using medication without problems or lightheadedness No chest pain with exertion or shortness of breath No Edema  Elevated Cholesterol Refusing statins.  States she is "scared of them." Diet: Exercise: Walking frequently and eating a lot of fruit  Skin lesion On right cheek.  Wonders abou  Relevant past medical, surgical, family and social history reviewed and updated as indicated. Interim medical history since our last visit reviewed. Allergies and medications reviewed and updated.  Review of Systems  Per HPI unless specifically indicated above     Objective:    BP (!) 145/100   Pulse (!) 101   Temp 99.2 F (37.3 C) (Oral)   Wt 176 lb 6.4 oz (80 kg)   LMP  (LMP Unknown) Comment: age 49  SpO2 95%   BMI 29.81 kg/m   Wt Readings from Last 3 Encounters:  05/07/17 176 lb 6.4 oz (80 kg)  11/08/16 180 lb (81.6 kg)  08/05/16 180 lb 9.6 oz (81.9 kg)    Physical Exam  Constitutional: She is oriented to person, place, and time. She appears well-developed and well-nourished. No distress.  HENT:  Head: Normocephalic and  atraumatic.  Eyes: Conjunctivae and lids are normal. Right eye exhibits no discharge. Left eye exhibits no discharge. No scleral icterus.  Neck: Normal range of motion. Neck supple. No JVD present. Carotid bruit is not present.  Cardiovascular: Normal rate, regular rhythm and normal heart sounds.  Pulmonary/Chest: Effort normal and breath sounds normal.  Abdominal: Normal appearance. There is no splenomegaly or hepatomegaly.  Musculoskeletal: Normal range of motion.  Neurological: She is alert and oriented to person, place, and time.  Skin: Skin is warm, dry and intact. No rash noted. No pallor.  Psychiatric: She has a normal mood and affect. Her behavior is normal. Judgment and thought content normal.    Results for orders placed or performed in visit on 11/08/16  Comprehensive metabolic panel  Result Value Ref Range   Glucose 220 (H) 65 - 99 mg/dL   BUN 12 8 - 27 mg/dL   Creatinine, Ser 0.58 0.57 - 1.00 mg/dL   GFR calc non Af Amer 96 >59 mL/min/1.73   GFR calc Af Amer 111 >59 mL/min/1.73   BUN/Creatinine Ratio 21 12 - 28   Sodium 138 134 - 144 mmol/L   Potassium 4.3 3.5 - 5.2 mmol/L   Chloride 95 (L) 96 - 106 mmol/L   CO2 24 20 - 29 mmol/L  Calcium 9.9 8.7 - 10.3 mg/dL   Total Protein 7.1 6.0 - 8.5 g/dL   Albumin 4.3 3.6 - 4.8 g/dL   Globulin, Total 2.8 1.5 - 4.5 g/dL   Albumin/Globulin Ratio 1.5 1.2 - 2.2   Bilirubin Total 0.5 0.0 - 1.2 mg/dL   Alkaline Phosphatase 50 39 - 117 IU/L   AST 102 (H) 0 - 40 IU/L   ALT 117 (H) 0 - 32 IU/L  Bayer DCA Hb A1c Waived  Result Value Ref Range   Bayer DCA Hb A1c Waived 8.5 (H) <7.0 %  Lipid Panel Piccolo, Waived  Result Value Ref Range   Cholesterol Piccolo, Waived 254 (H) <200 mg/dL   HDL Chol Piccolo, Waived 48 (L) >59 mg/dL   Triglycerides Piccolo,Waived 343 (H) <150 mg/dL   Chol/HDL Ratio Piccolo,Waive 5.3 (H) mg/dL   LDL Chol Calc Piccolo Waived 138 (H) <100 mg/dL   VLDL Chol Calc Piccolo,Waive 69 (H) <30 mg/dL        Assessment & Plan:   Problem List Items Addressed This Visit      Unprioritized   Elevated liver enzymes   Relevant Orders   Comprehensive metabolic panel   Hyperlipidemia    Willing to try a statin.  Start Atorvastatin 10 mg.        Relevant Medications   atorvastatin (LIPITOR) 10 MG tablet   Hypertension   Relevant Medications   atorvastatin (LIPITOR) 10 MG tablet   Other Relevant Orders   Comprehensive metabolic panel   Poorly controlled type 2 diabetes mellitus (HCC)    Hgb A1C 8.7%.  No improvement.  She cannot afford expensive medications.  Start Trulicity .75 mg.  First dose in office.  Continue with Metformin      Relevant Medications   atorvastatin (LIPITOR) 10 MG tablet   Dulaglutide (TRULICITY) 3.66 QH/4.7ML SOPN   Other Relevant Orders   Comprehensive metabolic panel   Bayer DCA Hb A1c Waived   Seborrheic keratoses    Bothering her.  She would like to have them removed.  Refer to Dermatology      Relevant Orders   Ambulatory referral to Dermatology    Other Visit Diagnoses    Need for pneumococcal vaccination    -  Primary   Relevant Orders   Pneumococcal polysaccharide vaccine 23-valent greater than or equal to 2yo subcutaneous/IM (Completed)       Follow up plan: Return in about 4 weeks (around 06/04/2017).

## 2017-05-07 NOTE — Patient Instructions (Signed)

## 2017-05-08 LAB — COMPREHENSIVE METABOLIC PANEL
ALBUMIN: 4.6 g/dL (ref 3.6–4.8)
ALK PHOS: 46 IU/L (ref 39–117)
ALT: 89 IU/L — AB (ref 0–32)
AST: 86 IU/L — ABNORMAL HIGH (ref 0–40)
Albumin/Globulin Ratio: 1.5 (ref 1.2–2.2)
BILIRUBIN TOTAL: 0.5 mg/dL (ref 0.0–1.2)
BUN/Creatinine Ratio: 17 (ref 12–28)
BUN: 10 mg/dL (ref 8–27)
CHLORIDE: 95 mmol/L — AB (ref 96–106)
CO2: 23 mmol/L (ref 20–29)
Calcium: 10.2 mg/dL (ref 8.7–10.3)
Creatinine, Ser: 0.58 mg/dL (ref 0.57–1.00)
GFR calc Af Amer: 111 mL/min/{1.73_m2} (ref >59)
GFR calc non Af Amer: 96 mL/min/{1.73_m2} (ref >59)
GLUCOSE: 146 mg/dL — AB (ref 65–99)
Globulin, Total: 3 g/dL (ref 1.5–4.5)
Potassium: 4 mmol/L (ref 3.5–5.2)
Sodium: 137 mmol/L (ref 134–144)
Total Protein: 7.6 g/dL (ref 6.0–8.5)

## 2017-05-14 ENCOUNTER — Telehealth: Payer: Self-pay

## 2017-05-14 NOTE — Telephone Encounter (Signed)
Copied from [place] [id number]. Topic: Inquiry >> May 14, 2017  1:41 PM Cecelia Byars, NT wrote: Reason for CRM: Patient called and would like  for someone from the practice to call her to show her how to use trulicity , she is unsure , please call heart   [phone]   Routing to provider for instructions.

## 2017-05-14 NOTE — Telephone Encounter (Signed)
Call pt  Pharmacist at her drugstore should show her how to do that if not we will just come on in

## 2017-05-15 NOTE — Telephone Encounter (Signed)
Called and left patient a VM (signed DPR) letting her know what Dr. Jeananne Rama said about medication. Asked for patient to call back with any questions or concerns.

## 2017-05-16 ENCOUNTER — Other Ambulatory Visit: Payer: Self-pay | Admitting: Unknown Physician Specialty

## 2017-05-16 NOTE — Telephone Encounter (Signed)
Refill request too early  Benazepril- HCTZ (Lotensin HCT) refill Last OV: 05/07/17 Last Refill:04/01/17 #90 tabs Pharmacy:[facility] [address] PCP: Kathrine Haddock NP

## 2017-06-13 ENCOUNTER — Ambulatory Visit: Payer: Medicare Other | Admitting: Unknown Physician Specialty

## 2017-06-25 ENCOUNTER — Ambulatory Visit: Payer: Medicare Other | Admitting: Unknown Physician Specialty

## 2017-06-30 ENCOUNTER — Telehealth: Payer: Self-pay | Admitting: Unknown Physician Specialty

## 2017-06-30 NOTE — Telephone Encounter (Signed)
Copied from [place] [id number]. Topic: Quick Communication - Rx Refill/Question >> Jun 30, 2017 11:23 AM Oliver Pila B wrote:  Pt out of medication, call to advise  Medication: benazepril-hydrochlorthiazide (LOTENSIN HCT) 20-25 MG tablet [[id number]]  Has the patient contacted their pharmacy? Yes.   (Agent: If no, request that the patient contact the pharmacy for the refill.) Preferred Pharmacy (with phone number or street name[facility] Agent: Please be advised that RX refills may take up to 3 business days. We ask that you follow-up with your pharmacy.

## 2017-07-01 ENCOUNTER — Ambulatory Visit (INDEPENDENT_AMBULATORY_CARE_PROVIDER_SITE_OTHER): Payer: Medicare Other | Admitting: Unknown Physician Specialty

## 2017-07-01 ENCOUNTER — Encounter: Payer: Self-pay | Admitting: Unknown Physician Specialty

## 2017-07-01 DIAGNOSIS — I10 Essential (primary) hypertension: Secondary | ICD-10-CM | POA: Diagnosis not present

## 2017-07-01 DIAGNOSIS — E1165 Type 2 diabetes mellitus with hyperglycemia: Secondary | ICD-10-CM

## 2017-07-01 MED ORDER — BLOOD GLUCOSE METER KIT: PACK | 0 refills | Status: DC

## 2017-07-01 MED ORDER — BENAZEPRIL-HYDROCHLOROTHIAZIDE 20-25 MG PO TABS
1.0000 | ORAL_TABLET | Freq: Every day | ORAL | 1 refills | Status: DC
Start: 2017-07-01 — End: 2017-12-13

## 2017-07-01 NOTE — Assessment & Plan Note (Signed)
Stable, continue present medications.   

## 2017-07-01 NOTE — Patient Instructions (Signed)
Please do call to schedule your mammogram and bone density; the number to schedule one at either [facility] or [facility] is [phone]   

## 2017-07-01 NOTE — Progress Notes (Signed)
BP 134/83   Pulse 96   Ht 5\' 5"  (1.651 m)   Wt 167 lb (75.8 kg)   LMP  (LMP Unknown) Comment: age 67  SpO2 100%   BMI 27.79 kg/m    Subjective:    Patient ID: Cheryl Mendez, female    DOB: 1950/05/24, 67 y.o.   MRN: [id number]  HPI: Cheryl Mendez is a 67 y.o. female  Chief Complaint  Patient presents with  . Medication Problem    Truclicity. Pt does not want to continue medication  . Diabetes   Diabetes: Using medications without difficulties.  Took the Trulicity for 1 month.  Lost weight and doesn't feels she needs it anymore.  States appetite is down despite running out of Trulicity and doesn't think she needs it anymore.  States she is not as hungry anymore.   No hypoglycemic episodes No hyperglycemic episodes Feet problems: none Blood Sugars averaging: not checking eye exam within last year Last Hgb A1C: 8.7%  Hypertension  Using medications without difficulty Average home BPs not checking   Using medication without problems or lightheadedness No chest pain with exertion or shortness of breath No Edema  Elevated Cholesterol Started Atorvastatin last visit.   Using medications without problems No Muscle aches  Diet: Exercise: Much improved diet.     Relevant past medical, surgical, family and social history reviewed and updated as indicated. Interim medical history since our last visit reviewed. Allergies and medications reviewed and updated.  Review of Systems  Per HPI unless specifically indicated above     Objective:    BP 134/83   Pulse 96   Ht 5\' 5"  (1.651 m)   Wt 167 lb (75.8 kg)   LMP  (LMP Unknown) Comment: age 17  SpO2 100%   BMI 27.79 kg/m   Wt Readings from Last 3 Encounters:  07/01/17 167 lb (75.8 kg)  05/07/17 176 lb 6.4 oz (80 kg)  11/08/16 180 lb (81.6 kg)    Physical Exam  Constitutional: She is oriented to person, place, and time. She appears well-developed and well-nourished. No distress.  HENT:  Head: Normocephalic and  atraumatic.  Eyes: Conjunctivae and lids are normal. Right eye exhibits no discharge. Left eye exhibits no discharge. No scleral icterus.  Neck: Normal range of motion. Neck supple. No JVD present. Carotid bruit is not present.  Cardiovascular: Normal rate, regular rhythm and normal heart sounds.  Pulmonary/Chest: Effort normal and breath sounds normal.  Abdominal: Normal appearance. There is no splenomegaly or hepatomegaly.  Musculoskeletal: Normal range of motion.  Neurological: She is alert and oriented to person, place, and time.  Skin: Skin is warm, dry and intact. No rash noted. No pallor.  Psychiatric: She has a normal mood and affect. Her behavior is normal. Judgment and thought content normal.    Results for orders placed or performed in visit on 05/07/17  Comprehensive metabolic panel  Result Value Ref Range   Glucose 146 (H) 65 - 99 mg/dL   BUN 10 8 - 27 mg/dL   Creatinine, Ser 0.58 0.57 - 1.00 mg/dL   GFR calc non Af Amer 96 >59 mL/min/1.73   GFR calc Af Amer 111 >59 mL/min/1.73   BUN/Creatinine Ratio 17 12 - 28   Sodium 137 134 - 144 mmol/L   Potassium 4.0 3.5 - 5.2 mmol/L   Chloride 95 (L) 96 - 106 mmol/L   CO2 23 20 - 29 mmol/L   Calcium 10.2 8.7 - 10.3 mg/dL  Total Protein 7.6 6.0 - 8.5 g/dL   Albumin 4.6 3.6 - 4.8 g/dL   Globulin, Total 3.0 1.5 - 4.5 g/dL   Albumin/Globulin Ratio 1.5 1.2 - 2.2   Bilirubin Total 0.5 0.0 - 1.2 mg/dL   Alkaline Phosphatase 46 39 - 117 IU/L   AST 86 (H) 0 - 40 IU/L   ALT 89 (H) 0 - 32 IU/L  Bayer DCA Hb A1c Waived  Result Value Ref Range   Bayer DCA Hb A1c Waived 8.7 (H) <7.0 %      Assessment & Plan:   Problem List Items Addressed This Visit      Unprioritized   Hypertension    Stable, continue present medications.        Poorly controlled type 2 diabetes mellitus ([place])    This seems to be better with weight loss.  Blood sugar is 124 and normal urine.  Will stop Trulicity at pt request but pt willing to restart if  needed.  Order for a new meter was written.        Relevant Orders   Glucose Hemocue Waived   UA/M w/rflx Culture, Routine       Follow up plan: Return in about 2 months (around 08/31/2017).

## 2017-07-01 NOTE — Assessment & Plan Note (Addendum)
This seems to be better with weight loss.  Blood sugar is 124 and normal urine.  Will stop Trulicity at pt request but pt willing to restart if needed.  Order for a new meter was written.

## 2017-07-01 NOTE — Telephone Encounter (Signed)
Request for Lotensin HCT 20- 25 mg tablet; last refill 04/01/17 Last OV 05/07/17 PCP  Wicker C NP  Refill  done per protocol  Pt. Was reminded of appt.   Voiced understanding.

## 2017-07-02 LAB — UA/M W/RFLX CULTURE, ROUTINE
Bilirubin, UA: NEGATIVE
GLUCOSE, UA: NEGATIVE
Ketones, UA: NEGATIVE
Leukocytes, UA: NEGATIVE
NITRITE UA: NEGATIVE
PH UA: 5 (ref 5.0–7.5)
Protein, UA: NEGATIVE
RBC, UA: NEGATIVE
Specific Gravity, UA: 1.025 (ref 1.005–1.030)
Urobilinogen, Ur: 0.2 mg/dL (ref 0.2–1.0)

## 2017-07-02 LAB — GLUCOSE HEMOCUE WAIVED: Glu Hemocue Waived: 124 mg/dL — ABNORMAL HIGH (ref 65–99)

## 2017-07-09 ENCOUNTER — Other Ambulatory Visit: Payer: Self-pay | Admitting: Unknown Physician Specialty

## 2017-07-10 NOTE — Telephone Encounter (Signed)
Metformin refill Last OV: 07/01/17 Last Refill:03/10/17 #180 tab No RF Pharmacy:[facility] [address] PCP: Kathrine Haddock NP Last Hgb A1C: 05/07/17

## 2017-07-28 ENCOUNTER — Other Ambulatory Visit: Payer: Self-pay | Admitting: Unknown Physician Specialty

## 2017-09-01 ENCOUNTER — Other Ambulatory Visit: Payer: Self-pay | Admitting: Unknown Physician Specialty

## 2017-09-02 NOTE — Telephone Encounter (Signed)
Request for One Touch Delica 30 G Lancets  LOV  07/01/17  NOV  09/16/17

## 2017-09-02 NOTE — Telephone Encounter (Signed)
Duplicate message, routing to close.

## 2017-09-16 ENCOUNTER — Encounter: Payer: Self-pay | Admitting: Unknown Physician Specialty

## 2017-09-16 ENCOUNTER — Ambulatory Visit (INDEPENDENT_AMBULATORY_CARE_PROVIDER_SITE_OTHER): Payer: Medicare Other | Admitting: Unknown Physician Specialty

## 2017-09-16 VITALS — BP 122/79 | HR 97 | Temp 99.0°F | Ht 65.0 in | Wt 170.3 lb

## 2017-09-16 DIAGNOSIS — I10 Essential (primary) hypertension: Secondary | ICD-10-CM | POA: Diagnosis not present

## 2017-09-16 DIAGNOSIS — E1165 Type 2 diabetes mellitus with hyperglycemia: Secondary | ICD-10-CM

## 2017-09-16 DIAGNOSIS — E782 Mixed hyperlipidemia: Secondary | ICD-10-CM

## 2017-09-16 LAB — BAYER DCA HB A1C WAIVED: HB A1C: 8.1 % — AB (ref <7.0)

## 2017-09-16 NOTE — Progress Notes (Signed)
BP 122/79   Pulse 97   Temp 99 F (37.2 C) (Oral)   Ht 5\' 5"  (1.651 m)   Wt 170 lb 4.8 oz (77.2 kg)   LMP  (LMP Unknown) Comment: age 67  SpO2 97%   BMI 28.34 kg/m    Subjective:    Patient ID: Cheryl Mendez, female    DOB: 11/27/1950, 67 y.o.   MRN: [id number]  HPI: Cheryl Mendez is a 67 y.o. female  Chief Complaint  Patient presents with  . Diabetes  . Hyperlipidemia  . Hypertension  . Gastroesophageal Reflux    pt states she would like to switch to nexium   Diabetes: Using medications without difficulties.   No hypoglycemic episodes No hyperglycemic episodes Feet problems: none Blood Sugars averaging:Not checking eye exam within last year Last Hgb A1C: 8.7  Hypertension  Using medications without difficulty Average home BPs   Using medication without problems or lightheadedness No chest pain with exertion or shortness of breath No Edema  Elevated Cholesterol Using medications without problems No Muscle aches  Diet: Exercise:   Relevant past medical, surgical, family and social history reviewed and updated as indicated. Interim medical history since our last visit reviewed. Allergies and medications reviewed and updated.  Review of Systems  Constitutional: Negative.   HENT: Negative.   Eyes: Negative.   Respiratory: Negative.   Cardiovascular: Negative.   Gastrointestinal: Negative.   Endocrine: Negative.   Genitourinary: Negative.   Musculoskeletal: Negative.   Skin: Negative.   Allergic/Immunologic: Negative.   Neurological: Negative.   Hematological: Negative.   Psychiatric/Behavioral: Negative.     Per HPI unless specifically indicated above     Objective:    BP 122/79   Pulse 97   Temp 99 F (37.2 C) (Oral)   Ht 5\' 5"  (1.651 m)   Wt 170 lb 4.8 oz (77.2 kg)   LMP  (LMP Unknown) Comment: age 87  SpO2 97%   BMI 28.34 kg/m   Wt Readings from Last 3 Encounters:  09/16/17 170 lb 4.8 oz (77.2 kg)  07/01/17 167 lb (75.8 kg)    05/07/17 176 lb 6.4 oz (80 kg)    Physical Exam  Constitutional: She is oriented to person, place, and time. She appears well-developed and well-nourished. No distress.  HENT:  Head: Normocephalic and atraumatic.  Eyes: Conjunctivae and lids are normal. Right eye exhibits no discharge. Left eye exhibits no discharge. No scleral icterus.  Neck: Normal range of motion. Neck supple. No JVD present. Carotid bruit is not present.  Cardiovascular: Normal rate, regular rhythm and normal heart sounds.  Pulmonary/Chest: Effort normal and breath sounds normal.  Abdominal: Normal appearance. There is no splenomegaly or hepatomegaly.  Musculoskeletal: Normal range of motion.  Neurological: She is alert and oriented to person, place, and time.  Skin: Skin is warm, dry and intact. No rash noted. No pallor.  Psychiatric: She has a normal mood and affect. Her behavior is normal. Judgment and thought content normal.    Results for orders placed or performed in visit on 09/16/17  Bayer DCA Hb A1c Waived  Result Value Ref Range   HB A1C (BAYER DCA - WAIVED) 8.1 (H) <7.0 %      Assessment & Plan:   Problem List Items Addressed This Visit      Unprioritized   Hyperlipidemia - Primary    Check lipid panel (non-fasting) and adjust statins accordingly      Relevant Orders   Lipid Panel w/o  Chol/HDL Ratio   Hypertension    Stable, continue present medications.        Relevant Orders   Comprehensive metabolic panel   Poorly controlled type 2 diabetes mellitus (HCC)    Hgb A1C is 8.1%.  This is down from 8.6%  She would like to continue with lifestyle changes and not add any additional meds at this time.  She feels the Trulicity needles hurt.  Will recheck I  3 months      Relevant Orders   Bayer DCA Hb A1c Waived (Completed)       Follow up plan: Return in about 3 months (around 12/17/2017).

## 2017-09-16 NOTE — Assessment & Plan Note (Signed)
Stable, continue present medications.   

## 2017-09-16 NOTE — Assessment & Plan Note (Signed)
Check lipid panel (non-fasting) and adjust statins accordingly

## 2017-09-16 NOTE — Assessment & Plan Note (Signed)
Hgb A1C is 8.1%.  This is down from 8.6%  She would like to continue with lifestyle changes and not add any additional meds at this time.  She feels the Trulicity needles hurt.  Will recheck I  3 months

## 2017-09-17 ENCOUNTER — Encounter: Payer: Self-pay | Admitting: Unknown Physician Specialty

## 2017-09-17 LAB — COMPREHENSIVE METABOLIC PANEL
ALBUMIN: 4.7 g/dL (ref 3.6–4.8)
ALK PHOS: 47 IU/L (ref 39–117)
ALT: 40 IU/L — ABNORMAL HIGH (ref 0–32)
AST: 41 IU/L — ABNORMAL HIGH (ref 0–40)
Albumin/Globulin Ratio: 1.7 (ref 1.2–2.2)
BILIRUBIN TOTAL: 0.5 mg/dL (ref 0.0–1.2)
BUN / CREAT RATIO: 15 (ref 12–28)
BUN: 9 mg/dL (ref 8–27)
CHLORIDE: 96 mmol/L (ref 96–106)
CO2: 25 mmol/L (ref 20–29)
CREATININE: 0.6 mg/dL (ref 0.57–1.00)
Calcium: 10.3 mg/dL (ref 8.7–10.3)
GFR calc non Af Amer: 95 mL/min/{1.73_m2} (ref >59)
GFR, EST AFRICAN AMERICAN: 109 mL/min/{1.73_m2} (ref >59)
GLOBULIN, TOTAL: 2.8 g/dL (ref 1.5–4.5)
Glucose: 112 mg/dL — ABNORMAL HIGH (ref 65–99)
Potassium: 3.9 mmol/L (ref 3.5–5.2)
SODIUM: 141 mmol/L (ref 134–144)
TOTAL PROTEIN: 7.5 g/dL (ref 6.0–8.5)

## 2017-09-17 LAB — LIPID PANEL W/O CHOL/HDL RATIO
CHOLESTEROL TOTAL: 162 mg/dL (ref 100–199)
HDL: 43 mg/dL (ref >39)
LDL CALC: 77 mg/dL (ref 0–99)
Triglycerides: 208 mg/dL — ABNORMAL HIGH (ref 0–149)
VLDL CHOLESTEROL CAL: 42 mg/dL — AB (ref 5–40)

## 2017-09-27 ENCOUNTER — Other Ambulatory Visit: Payer: Self-pay | Admitting: Unknown Physician Specialty

## 2017-10-17 ENCOUNTER — Other Ambulatory Visit: Payer: Self-pay

## 2017-10-17 ENCOUNTER — Ambulatory Visit (INDEPENDENT_AMBULATORY_CARE_PROVIDER_SITE_OTHER): Payer: Medicare Other | Admitting: Family Medicine

## 2017-10-17 ENCOUNTER — Telehealth: Payer: Self-pay | Admitting: Unknown Physician Specialty

## 2017-10-17 ENCOUNTER — Encounter: Payer: Self-pay | Admitting: Family Medicine

## 2017-10-17 VITALS — BP 135/88 | HR 103 | Temp 97.3°F | Ht 65.0 in | Wt 175.3 lb

## 2017-10-17 DIAGNOSIS — M7541 Impingement syndrome of right shoulder: Secondary | ICD-10-CM | POA: Diagnosis not present

## 2017-10-17 MED ORDER — CYCLOBENZAPRINE HCL 10 MG PO TABS
10.0000 mg | ORAL_TABLET | Freq: Three times a day (TID) | ORAL | 0 refills | Status: DC | PRN
Start: 2017-10-17 — End: 2017-10-21

## 2017-10-17 MED ORDER — TRIAMCINOLONE ACETONIDE 40 MG/ML IJ SUSP
40.0000 mg | Freq: Once | INTRAMUSCULAR | Status: AC
  Administered 2017-10-17: 40 mg via INTRAMUSCULAR

## 2017-10-17 NOTE — Patient Instructions (Signed)
Follow up as needed

## 2017-10-17 NOTE — Telephone Encounter (Signed)
Cheryl Mendez saw this patient in clinic today.

## 2017-10-17 NOTE — Telephone Encounter (Signed)
Copied from [place] [id number]. Topic: Quick Communication - Rx Refill/Question >> Oct 17, 2017  3:37 PM Blase Mess A wrote: Medication: triamcinolone acetonide (KENALOG-40) injection 40 mg   [[id number]] [facility]  Has the patient contacted their pharmacy? Yes (Agent: If no, request that the patient contact the pharmacy for the refill.) (Agent: If yes, when and what did the pharmacy advise?)  Preferred Pharmacy (with phone number or street name): [facility] [place], [place] [address] Phone: [phone] Fax: [phone] Not a 24 hour pharmacy; exact hours not known.    Agent: Please be advised that RX refills may take up to 3 business days. We ask that you follow-up with your pharmacy.

## 2017-10-17 NOTE — Progress Notes (Signed)
BP 135/88   Pulse (!) 103   Temp (!) 97.3 F (36.3 C) (Oral)   Ht 5\' 5"  (1.651 m)   Wt 175 lb 4.8 oz (79.5 kg)   LMP  (LMP Unknown) Comment: age 67  SpO2 98%   BMI 29.17 kg/m    Subjective:    Patient ID: Cheryl Mendez, female    DOB: [birth year], 67 y.o.   MRN: [id number]  HPI: [name] is a 67 y.o. female  Chief Complaint  Patient presents with  . Shoulder Pain    right side ongoing since last Sunday/ states she has been taken alive   Right shoulder pain x 1 week. Thinks it started after she moved a heavy flower pot the day before. Taking aleve and icy hot with some temporary relief. Has seen orthopedics for rotator cuff tendonitis/impingement synndrome in the past and had intra-articular kenalog with complete relief. Pain radiates down arm and causes some numbness tingling. Denies redness, swelling, fevers.   Relevant past medical, surgical, family and social history reviewed and updated as indicated. Interim medical history since our last visit reviewed. Allergies and medications reviewed and updated.  Review of Systems  Per HPI unless specifically indicated above     Objective:    BP 135/88   Pulse (!) 103   Temp (!) 97.3 F (36.3 C) (Oral)   Ht 5\' 5"  (1.651 m)   Wt 175 lb 4.8 oz (79.5 kg)   LMP  (LMP Unknown) Comment: age 30  SpO2 98%   BMI 29.17 kg/m   Wt Readings from Last 3 Encounters:  10/17/17 175 lb 4.8 oz (79.5 kg)  09/16/17 170 lb 4.8 oz (77.2 kg)  07/01/17 167 lb (75.8 kg)    Physical Exam  Constitutional: She is oriented to person, place, and time. She appears well-developed and well-nourished.  HENT:  Head: Atraumatic.  Eyes: Conjunctivae and EOM are normal.  Neck: Normal range of motion. Neck supple.  Cardiovascular: Normal rate, regular rhythm and intact distal pulses.  Pulmonary/Chest: Effort normal and breath sounds normal.  Musculoskeletal: She exhibits tenderness (ttp medial superior aspect of right scapula). She exhibits no  edema or deformity.  ROM intact except when rotating right arm behind back Grip strength reduced right hand  Neurological: She is alert and oriented to person, place, and time. A sensory deficit (mild sensation change right arm to light touch) is present.  Skin: Skin is warm and dry.  Psychiatric: She has a normal mood and affect. Her behavior is normal.  Nursing note and vitals reviewed.   Results for orders placed or performed in visit on 09/16/17  Comprehensive metabolic panel  Result Value Ref Range   Glucose 112 (H) 65 - 99 mg/dL   BUN 9 8 - 27 mg/dL   Creatinine, Ser 0.60 0.57 - 1.00 mg/dL   GFR calc non Af Amer 95 >59 mL/min/1.73   GFR calc Af Amer 109 >59 mL/min/1.73   BUN/Creatinine Ratio 15 12 - 28   Sodium 141 134 - 144 mmol/L   Potassium 3.9 3.5 - 5.2 mmol/L   Chloride 96 96 - 106 mmol/L   CO2 25 20 - 29 mmol/L   Calcium 10.3 8.7 - 10.3 mg/dL   Total Protein 7.5 6.0 - 8.5 g/dL   Albumin 4.7 3.6 - 4.8 g/dL   Globulin, Total 2.8 1.5 - 4.5 g/dL   Albumin/Globulin Ratio 1.7 1.2 - 2.2   Bilirubin Total 0.5 0.0 - 1.2 mg/dL  Alkaline Phosphatase 47 39 - 117 IU/L   AST 41 (H) 0 - 40 IU/L   ALT 40 (H) 0 - 32 IU/L  Lipid Panel w/o Chol/HDL Ratio  Result Value Ref Range   Cholesterol, Total 162 100 - 199 mg/dL   Triglycerides 208 (H) 0 - 149 mg/dL   HDL 43 >39 mg/dL   VLDL Cholesterol Cal 42 (H) 5 - 40 mg/dL   LDL Calculated 77 0 - 99 mg/dL  Bayer DCA Hb A1c Waived  Result Value Ref Range   HB A1C (BAYER DCA - WAIVED) 8.1 (H) <7.0 %      Assessment & Plan:   Problem List Items Addressed This Visit    None    Visit Diagnoses    Impingement syndrome of right shoulder region    -  Primary   IM kenalog, flexeril prn. Continue aleve and icy hot prn, heat, stretches. F/u with orthopedics if not improving   Relevant Medications   triamcinolone acetonide (KENALOG-40) injection 40 mg (Start on 10/17/2017  3:00 PM)       Follow up plan: Return if symptoms worsen or  fail to improve.

## 2017-10-21 MED ORDER — CYCLOBENZAPRINE HCL 10 MG PO TABS: 10.0000 mg | ORAL_TABLET | Freq: Three times a day (TID) | ORAL | 0 refills | Status: DC | PRN

## 2017-10-21 NOTE — Telephone Encounter (Signed)
Tried calling patient, answering machine picked up, was going to leave VM. Then someone picked up the phone and hung up. Will try again later.

## 2017-10-21 NOTE — Addendum Note (Signed)
Addended by: Merrie Roof E on: 10/21/2017 08:57 AM   Modules accepted: Orders

## 2017-10-21 NOTE — Telephone Encounter (Signed)
I think she means the flexeril, the kenalog was an IM injection in clinic. Either way, I resent the flexeril to [facility]

## 2017-10-22 NOTE — Telephone Encounter (Signed)
Patient notified and verbalized understanding. 

## 2017-11-07 ENCOUNTER — Encounter: Payer: Self-pay | Admitting: Unknown Physician Specialty

## 2017-11-07 ENCOUNTER — Ambulatory Visit (INDEPENDENT_AMBULATORY_CARE_PROVIDER_SITE_OTHER): Payer: Medicare Other | Admitting: Unknown Physician Specialty

## 2017-11-07 VITALS — BP 146/82 | HR 89 | Temp 98.2°F | Ht 65.0 in | Wt 170.7 lb

## 2017-11-07 DIAGNOSIS — H6503 Acute serous otitis media, bilateral: Secondary | ICD-10-CM

## 2017-11-07 DIAGNOSIS — R42 Dizziness and giddiness: Secondary | ICD-10-CM | POA: Diagnosis not present

## 2017-11-07 DIAGNOSIS — Z23 Encounter for immunization: Secondary | ICD-10-CM

## 2017-11-07 MED ORDER — FLUTICASONE PROPIONATE 50 MCG/ACT NA SUSP: 2.0000 | Freq: Every day | NASAL | 6 refills | Status: DC

## 2017-11-07 NOTE — Patient Instructions (Signed)

## 2017-11-07 NOTE — Progress Notes (Signed)
BP (!) 146/82   Pulse 89   Temp 98.2 F (36.8 C) (Oral)   Ht 5\' 5"  (1.651 m)   Wt 170 lb 11.2 oz (77.4 kg)   LMP  (LMP Unknown) Comment: age 67  SpO2 98%   BMI 28.41 kg/m    Subjective:    Patient ID: Cheryl Mendez, female    DOB: 12-16-1950, 67 y.o.   MRN: [id number]  HPI: Cheryl Mendez is a 67 y.o. female  Chief Complaint  Patient presents with  . Dizziness    pt states she has been feeling dizzy and had a ear ache for a week    Dizziness  This is a new problem. The current episode started in the past 7 days. The problem occurs constantly (In the AM). The problem has been waxing and waning. Associated symptoms include congestion and vertigo. Pertinent negatives include no abdominal pain, anorexia, arthralgias, change in bowel habit, chest pain, chills, coughing, diaphoresis, fatigue, headaches, myalgias, nausea, neck pain, numbness, rash or weakness. Nothing aggravates the symptoms. She has tried nothing for the symptoms.    Relevant past medical, surgical, family and social history reviewed and updated as indicated. Interim medical history since our last visit reviewed. Allergies and medications reviewed and updated.  Review of Systems  Constitutional: Negative for chills, diaphoresis and fatigue.  HENT: Positive for congestion.   Respiratory: Negative for cough.   Cardiovascular: Negative for chest pain.  Gastrointestinal: Negative for abdominal pain, anorexia, change in bowel habit and nausea.  Musculoskeletal: Negative for arthralgias, myalgias and neck pain.  Skin: Negative for rash.  Neurological: Positive for dizziness and vertigo. Negative for weakness, numbness and headaches.    Per HPI unless specifically indicated above     Objective:    BP (!) 146/82   Pulse 89   Temp 98.2 F (36.8 C) (Oral)   Ht 5\' 5"  (1.651 m)   Wt 170 lb 11.2 oz (77.4 kg)   LMP  (LMP Unknown) Comment: age 35  SpO2 98%   BMI 28.41 kg/m   Wt Readings from Last 3 Encounters:    11/07/17 170 lb 11.2 oz (77.4 kg)  10/17/17 175 lb 4.8 oz (79.5 kg)  09/16/17 170 lb 4.8 oz (77.2 kg)    Physical Exam  Constitutional: She is oriented to person, place, and time. She appears well-developed and well-nourished. No distress.  HENT:  Head: Normocephalic and atraumatic.  Eyes: Conjunctivae and lids are normal. Right eye exhibits no discharge. Left eye exhibits no discharge. No scleral icterus.  Neck: Normal range of motion. Neck supple. No JVD present. Carotid bruit is not present.  Cardiovascular: Normal rate, regular rhythm and normal heart sounds.  Pulmonary/Chest: Effort normal and breath sounds normal.  Abdominal: Normal appearance. There is no splenomegaly or hepatomegaly.  Musculoskeletal: Normal range of motion.  Neurological: She is alert and oriented to person, place, and time.  Skin: Skin is warm, dry and intact. No rash noted. No pallor.  Psychiatric: She has a normal mood and affect. Her behavior is normal. Judgment and thought content normal.    Results for orders placed or performed in visit on 09/16/17  Comprehensive metabolic panel  Result Value Ref Range   Glucose 112 (H) 65 - 99 mg/dL   BUN 9 8 - 27 mg/dL   Creatinine, Ser 0.60 0.57 - 1.00 mg/dL   GFR calc non Af Amer 95 >59 mL/min/1.73   GFR calc Af Amer 109 >59 mL/min/1.73   BUN/Creatinine Ratio  15 12 - 28   Sodium 141 134 - 144 mmol/L   Potassium 3.9 3.5 - 5.2 mmol/L   Chloride 96 96 - 106 mmol/L   CO2 25 20 - 29 mmol/L   Calcium 10.3 8.7 - 10.3 mg/dL   Total Protein 7.5 6.0 - 8.5 g/dL   Albumin 4.7 3.6 - 4.8 g/dL   Globulin, Total 2.8 1.5 - 4.5 g/dL   Albumin/Globulin Ratio 1.7 1.2 - 2.2   Bilirubin Total 0.5 0.0 - 1.2 mg/dL   Alkaline Phosphatase 47 39 - 117 IU/L   AST 41 (H) 0 - 40 IU/L   ALT 40 (H) 0 - 32 IU/L  Lipid Panel w/o Chol/HDL Ratio  Result Value Ref Range   Cholesterol, Total 162 100 - 199 mg/dL   Triglycerides 208 (H) 0 - 149 mg/dL   HDL 43 >39 mg/dL   VLDL Cholesterol  Cal 42 (H) 5 - 40 mg/dL   LDL Calculated 77 0 - 99 mg/dL  Bayer DCA Hb A1c Waived  Result Value Ref Range   HB A1C (BAYER DCA - WAIVED) 8.1 (H) <7.0 %      Assessment & Plan:   Problem List Items Addressed This Visit    None    Visit Diagnoses    Need for influenza vaccination    -  Primary   Relevant Orders   Flu vaccine HIGH DOSE PF (Completed)   Non-recurrent acute serous otitis media of both ears       Rx for Flonase.  Restart Clariton.     Vertigo       Related to allergies.  Will treat flonase plus Clariton       Follow up plan: Return if symptoms worsen or fail to improve.

## 2017-12-13 ENCOUNTER — Other Ambulatory Visit: Payer: Self-pay | Admitting: Physician Assistant

## 2017-12-13 ENCOUNTER — Other Ambulatory Visit: Payer: Self-pay | Admitting: Unknown Physician Specialty

## 2017-12-15 NOTE — Telephone Encounter (Signed)
Refill request for Metformin approved.  Pt has next appt on 12/24/17.

## 2017-12-15 NOTE — Telephone Encounter (Signed)
Requested Prescriptions  Pending Prescriptions Disp Refills  . benazepril-hydrochlorthiazide (LOTENSIN HCT) 20-25 MG tablet [Pharmacy Med Name: BENAZEPRIL/HCTZ 20/25MG  TABLETS] 90 tablet 0    Sig: TAKE 1 TABLET BY MOUTH DAILY     Cardiovascular:  ACEI + Diuretic Combos Failed - 12/13/2017  6:16 AM      Failed - Last BP in normal range    BP Readings from Last 1 Encounters:  11/07/17 (!) 146/82         Passed - Na in normal range and within 180 days    Sodium  Date Value Ref Range Status  09/16/2017 141 134 - 144 mmol/L Final  06/09/2014 136 mmol/L Final    Comment:    135-145 NOTE: New Reference Range  04/26/14          Passed - K in normal range and within 180 days    Potassium  Date Value Ref Range Status  09/16/2017 3.9 3.5 - 5.2 mmol/L Final  06/09/2014 3.3 (L) mmol/L Final    Comment:    3.5-5.1 NOTE: New Reference Range  04/26/14          Passed - Cr in normal range and within 180 days    Creatinine  Date Value Ref Range Status  06/09/2014 0.73 mg/dL Final    Comment:    0.44-1.00 NOTE: New Reference Range  04/26/14    Creatinine, Ser  Date Value Ref Range Status  09/16/2017 0.60 0.57 - 1.00 mg/dL Final         Passed - Ca in normal range and within 180 days    Calcium  Date Value Ref Range Status  09/16/2017 10.3 8.7 - 10.3 mg/dL Final   Calcium, Total  Date Value Ref Range Status  06/09/2014 9.6 mg/dL Final    Comment:    8.9-10.3 NOTE: New Reference Range  04/26/14          Passed - Patient is not pregnant      Passed - Valid encounter within last 6 months    Recent Outpatient Visits          1 month ago Need for influenza vaccination   [facility] Kathrine Haddock, NP   1 month ago Impingement syndrome of right shoulder region   [facility], [place], [place]   3 months ago Mixed hyperlipidemia   [facility] Kathrine Haddock, NP   5 months ago Poorly controlled type 2 diabetes mellitus  ([place])   [place] Kathrine Haddock, NP   7 months ago Need for pneumococcal vaccination   [facility] Kathrine Haddock, NP      Future Appointments            In 1 week Venita Lick, NP [employer], [facility]

## 2017-12-24 ENCOUNTER — Ambulatory Visit: Payer: Medicare Other | Admitting: Nurse Practitioner

## 2018-03-09 IMAGING — NM NM GASTRIC EMPTYING
1 series · 8 of 8 positions shown · non-contrast
Comparison: None.

CLINICAL DATA: Gastroparesis and epigastric pain after eating.

EXAM:
NUCLEAR MEDICINE GASTRIC EMPTYING SCAN
TECHNIQUE: After oral ingestion of radiolabeled meal, sequential abdominal
images were obtained for 4 hours. Percentage of activity emptying
the stomach was calculated at 1 hour, 2 hour, 3 hour hours; 4 hour
measurement was not performed as detailed below.
RADIOPHARMACEUTICALS:  2.2 mCi 7c-NNm sulfur colloid in standardized
meal

[Series 1000: gastric statics 3 hour (results) · 3.90mm/px · 4 acquisitions, 8 frames shown]
[im 1/4]
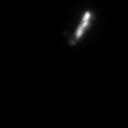
[im 1/4]
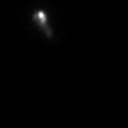
[im 2/4]
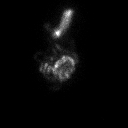
[im 2/4]
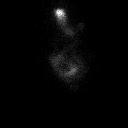
[im 3/4]
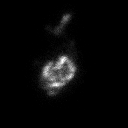
[im 3/4]
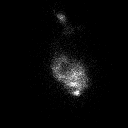
[im 4/4]
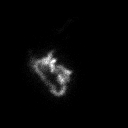
[im 4/4]
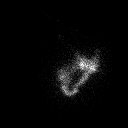

[8 of 8 positions shown; findings below may reference images not displayed]

FINDINGS: Expected location of the stomach in the left upper quadrant.
Ingested meal empties the stomach gradually over the course of the
study.

54% emptied at 1 hr ( normal >= 10%)

89% emptied at 2 hr ( normal >= 40%)

99% emptied at 3 hr ( normal >= 70%)

4 hour imaging: Not performed due to the 99% rating at the 3 hour
measurement.
IMPRESSION: Normal gastric emptying study.

## 2018-03-13 ENCOUNTER — Other Ambulatory Visit: Payer: Self-pay | Admitting: Nurse Practitioner

## 2018-03-13 ENCOUNTER — Other Ambulatory Visit: Payer: Self-pay | Admitting: Unknown Physician Specialty

## 2018-03-13 NOTE — Telephone Encounter (Signed)
Requested Prescriptions  Pending Prescriptions Disp Refills  . benazepril-hydrochlorthiazide (LOTENSIN HCT) 20-25 MG tablet [Pharmacy Med Name: BENAZEPRIL/HCTZ 20/25MG  TABLETS] 90 tablet 0    Sig: TAKE 1 TABLET BY MOUTH DAILY     Cardiovascular:  ACEI + Diuretic Combos Failed - 03/13/2018  6:49 AM      Failed - Last BP in normal range    BP Readings from Last 1 Encounters:  11/07/17 (!) 146/82         Passed - Na in normal range and within 180 days    Sodium  Date Value Ref Range Status  09/16/2017 141 134 - 144 mmol/L Final  06/09/2014 136 mmol/L Final    Comment:    135-145 NOTE: New Reference Range  04/26/14          Passed - K in normal range and within 180 days    Potassium  Date Value Ref Range Status  09/16/2017 3.9 3.5 - 5.2 mmol/L Final  06/09/2014 3.3 (L) mmol/L Final    Comment:    3.5-5.1 NOTE: New Reference Range  04/26/14          Passed - Cr in normal range and within 180 days    Creatinine  Date Value Ref Range Status  06/09/2014 0.73 mg/dL Final    Comment:    0.44-1.00 NOTE: New Reference Range  04/26/14    Creatinine, Ser  Date Value Ref Range Status  09/16/2017 0.60 0.57 - 1.00 mg/dL Final         Passed - Ca in normal range and within 180 days    Calcium  Date Value Ref Range Status  09/16/2017 10.3 8.7 - 10.3 mg/dL Final   Calcium, Total  Date Value Ref Range Status  06/09/2014 9.6 mg/dL Final    Comment:    8.9-10.3 NOTE: New Reference Range  04/26/14          Passed - Patient is not pregnant      Passed - Valid encounter within last 6 months    Recent Outpatient Visits          4 months ago Need for influenza vaccination   [facility] Kathrine Haddock, NP   4 months ago Impingement syndrome of right shoulder region   [facility], [place], [place]   5 months ago Mixed hyperlipidemia   [facility] Kathrine Haddock, NP   8 months ago Poorly controlled type 2 diabetes mellitus  ([place])   [place] Kathrine Haddock, NP   10 months ago Need for pneumococcal vaccination   [facility] Kathrine Haddock, NP           . atorvastatin (LIPITOR) 10 MG tablet [Pharmacy Med Name: ATORVASTATIN 10MG  TABLETS] 90 tablet 3    Sig: TAKE 1 TABLET(10 MG) BY MOUTH DAILY     Cardiovascular:  Antilipid - Statins Failed - 03/13/2018  6:49 AM      Failed - Triglycerides in normal range and within 360 days    Triglycerides  Date Value Ref Range Status  09/16/2017 208 (H) 0 - 149 mg/dL Final   Triglycerides Piccolo,Waived  Date Value Ref Range Status  11/08/2016 343 (H) <150 mg/dL Final    Comment:                            Normal                   <  150                         Borderline High     150 - 199                         High                200 - 499                         Very High                >499          Passed - Total Cholesterol in normal range and within 360 days    Cholesterol, Total  Date Value Ref Range Status  09/16/2017 162 100 - 199 mg/dL Final   Cholesterol Piccolo, Waived  Date Value Ref Range Status  11/08/2016 254 (H) <200 mg/dL Final    Comment:                            Desirable                <200                         Borderline High      200- 239                         High                     >239          Passed - LDL in normal range and within 360 days    LDL Calculated  Date Value Ref Range Status  09/16/2017 77 0 - 99 mg/dL Final         Passed - HDL in normal range and within 360 days    HDL  Date Value Ref Range Status  09/16/2017 43 >39 mg/dL Final         Passed - Patient is not pregnant      Passed - Valid encounter within last 12 months    Recent Outpatient Visits          4 months ago Need for influenza vaccination   [facility] Kathrine Haddock, NP   4 months ago Impingement syndrome of right shoulder region   [facility], [place], [place]   5  months ago Mixed hyperlipidemia   [facility] Kathrine Haddock, NP   8 months ago Poorly controlled type 2 diabetes mellitus ([place])   [place] Kathrine Haddock, NP   10 months ago Need for pneumococcal vaccination   [facility] Kathrine Haddock, NP

## 2018-03-13 NOTE — Telephone Encounter (Signed)
Requested medication (s) are due for refill today: yes  Requested medication (s) are on the active medication list: yes  Last refill:  12/15/17  Future visit scheduled: no  Notes to clinic:  Blood work due 12/17/17   Requested Prescriptions  Pending Prescriptions Disp Refills   metFORMIN (GLUCOPHAGE) 1000 MG tablet [Pharmacy Med Name: METFORMIN '1000MG'$  TABLETS] 180 tablet 0    Sig: TAKE 1 TABLET(1000 MG) BY MOUTH TWICE DAILY WITH A MEAL     Endocrinology:  Diabetes - Biguanides Failed - 03/13/2018  6:48 AM      Failed - HBA1C is between 0 and 7.9 and within 180 days    Hemoglobin A1C  Date Value Ref Range Status  10/17/2015 7.4%  Final   HB A1C (BAYER DCA - WAIVED)  Date Value Ref Range Status  09/16/2017 8.1 (H) <7.0 % Final    Comment:                                          Diabetic Adult            <7.0                                       Healthy Adult        4.3 - 5.7                                                           (DCCT/NGSP) American Diabetes Association's Summary of Glycemic Recommendations for Adults with Diabetes: Hemoglobin A1c <7.0%. More stringent glycemic goals (A1c <6.0%) may further reduce complications at the cost of increased risk of hypoglycemia.          Passed - Cr in normal range and within 360 days    Creatinine  Date Value Ref Range Status  06/09/2014 0.73 mg/dL Final    Comment:    0.44-1.00 NOTE: New Reference Range  04/26/14    Creatinine, Ser  Date Value Ref Range Status  09/16/2017 0.60 0.57 - 1.00 mg/dL Final         Passed - eGFR in normal range and within 360 days    EGFR (African American)  Date Value Ref Range Status  06/09/2014 >60  Final   GFR calc Af Amer  Date Value Ref Range Status  09/16/2017 109 >59 mL/min/1.73 Final   EGFR (Non-African Amer.)  Date Value Ref Range Status  06/09/2014 >60  Final    Comment:    eGFR values <73m/min/1.73 m2 may be an indication of chronic kidney disease  (CKD). Calculated eGFR is useful in patients with stable renal function. The eGFR calculation will not be reliable in acutely ill patients when serum creatinine is changing rapidly. It is not useful in patients on dialysis. The eGFR calculation may not be applicable to patients at the low and high extremes of body sizes, pregnant women, and vegetarians.    GFR calc non Af Amer  Date Value Ref Range Status  09/16/2017 95 >59 mL/min/1.73 Final         Passed - Valid encounter within last 6 months  Recent Outpatient Visits          4 months ago Need for influenza vaccination   [facility] Kathrine Haddock, NP   4 months ago Impingement syndrome of right shoulder region   [facility], [place], [place]   5 months ago Mixed hyperlipidemia   [facility] Kathrine Haddock, NP   8 months ago Poorly controlled type 2 diabetes mellitus [facility])   [place] Kathrine Haddock, NP   10 months ago Need for pneumococcal vaccination   [facility] Kathrine Haddock, NP

## 2018-03-13 NOTE — Telephone Encounter (Signed)
I will refill for 30 days, but patient has not had A1C (diabetes check) since July.  She needs to come in for follow-up.  Thank you.

## 2018-03-13 NOTE — Telephone Encounter (Signed)
Patient will require follow-up for further refills as no A1C done since July.

## 2018-05-20 ENCOUNTER — Telehealth: Payer: Self-pay | Admitting: Unknown Physician Specialty

## 2018-05-20 NOTE — Telephone Encounter (Signed)
Pt called and l/m for a nurse to give her a call back she didn't leave any details on voicemail about  this was concerning.

## 2018-05-21 NOTE — Telephone Encounter (Signed)
Patient called concerned about medication, she would like to know if she could get a month supply. Looking at her meds she should have enough to last until April.

## 2018-05-21 NOTE — Telephone Encounter (Signed)
Routed to wrong practice. Sending to [facility].

## 2018-07-02 ENCOUNTER — Other Ambulatory Visit: Payer: Self-pay | Admitting: Unknown Physician Specialty

## 2018-07-02 NOTE — Telephone Encounter (Signed)
LVM for patient to schedule fu visit with Jolene for end of June

## 2018-07-02 NOTE — Telephone Encounter (Signed)
Requested medication (s) are due for refill today: Yes  Requested medication (s) are on the active medication list: Yes  Last refill:  03/13/18  Future visit scheduled: No  Notes to clinic:  Cheryl Mendez    Requested Prescriptions  Pending Prescriptions Disp Refills   benazepril-hydrochlorthiazide (LOTENSIN HCT) 20-25 MG tablet [Pharmacy Med Name: BENAZEPRIL/HCTZ 20/25MG  TABLETS] 90 tablet 0    Sig: TAKE 1 TABLET BY MOUTH DAILY     Cardiovascular:  ACEI + Diuretic Combos Failed - 07/02/2018  7:43 AM      Failed - Na in normal range and within 180 days    Sodium  Date Value Ref Range Status  09/16/2017 141 134 - 144 mmol/L Final  06/09/2014 136 mmol/L Final    Comment:    135-145 NOTE: New Reference Range  04/26/14          Failed - K in normal range and within 180 days    Potassium  Date Value Ref Range Status  09/16/2017 3.9 3.5 - 5.2 mmol/L Final  06/09/2014 3.3 (L) mmol/L Final    Comment:    3.5-5.1 NOTE: New Reference Range  04/26/14          Failed - Cr in normal range and within 180 days    Creatinine  Date Value Ref Range Status  06/09/2014 0.73 mg/dL Final    Comment:    0.44-1.00 NOTE: New Reference Range  04/26/14    Creatinine, Ser  Date Value Ref Range Status  09/16/2017 0.60 0.57 - 1.00 mg/dL Final         Failed - Ca in normal range and within 180 days    Calcium  Date Value Ref Range Status  09/16/2017 10.3 8.7 - 10.3 mg/dL Final   Calcium, Total  Date Value Ref Range Status  06/09/2014 9.6 mg/dL Final    Comment:    8.9-10.3 NOTE: New Reference Range  04/26/14          Failed - Last BP in normal range    BP Readings from Last 1 Encounters:  11/07/17 (!) 146/82         Failed - Valid encounter within last 6 months    Recent Outpatient Visits          7 months ago Need for influenza vaccination   [facility] Cheryl Haddock, NP   8 months ago Impingement syndrome of right shoulder region   [facility], [place], [place]   9 months ago Mixed hyperlipidemia   [facility] Cheryl Haddock, NP   1 year ago Poorly controlled type 2 diabetes mellitus ([place])   [place] Cheryl Haddock, NP   1 year ago Need for pneumococcal vaccination   [facility] Cheryl Haddock, NP             Passed - Patient is not pregnant

## 2018-07-27 ENCOUNTER — Telehealth: Payer: Self-pay | Admitting: Nurse Practitioner

## 2018-07-27 NOTE — Telephone Encounter (Signed)
Called pt to go over screening questions for tomorrow's appt no answer, left voicemail

## 2018-07-28 ENCOUNTER — Other Ambulatory Visit: Payer: Self-pay

## 2018-07-28 ENCOUNTER — Encounter: Payer: Self-pay | Admitting: Nurse Practitioner

## 2018-07-28 ENCOUNTER — Ambulatory Visit (INDEPENDENT_AMBULATORY_CARE_PROVIDER_SITE_OTHER): Payer: Medicare Other | Admitting: Nurse Practitioner

## 2018-07-28 VITALS — BP 118/79 | HR 87 | Temp 98.6°F

## 2018-07-28 DIAGNOSIS — E1169 Type 2 diabetes mellitus with other specified complication: Secondary | ICD-10-CM | POA: Diagnosis not present

## 2018-07-28 DIAGNOSIS — R748 Abnormal levels of other serum enzymes: Secondary | ICD-10-CM

## 2018-07-28 DIAGNOSIS — E785 Hyperlipidemia, unspecified: Secondary | ICD-10-CM

## 2018-07-28 DIAGNOSIS — I10 Essential (primary) hypertension: Secondary | ICD-10-CM | POA: Diagnosis not present

## 2018-07-28 DIAGNOSIS — K219 Gastro-esophageal reflux disease without esophagitis: Secondary | ICD-10-CM

## 2018-07-28 DIAGNOSIS — E1165 Type 2 diabetes mellitus with hyperglycemia: Secondary | ICD-10-CM | POA: Diagnosis not present

## 2018-07-28 DIAGNOSIS — Z1329 Encounter for screening for other suspected endocrine disorder: Secondary | ICD-10-CM

## 2018-07-28 LAB — LIPID PANEL PICCOLO, WAIVED
Chol/HDL Ratio Piccolo,Waive: 4.3 mg/dL
Cholesterol Piccolo, Waived: 159 mg/dL (ref <200)
HDL Chol Piccolo, Waived: 37 mg/dL — ABNORMAL LOW (ref >59)
LDL Chol Calc Piccolo Waived: 61 mg/dL (ref <100)
Triglycerides Piccolo,Waived: 308 mg/dL — ABNORMAL HIGH (ref <150)
VLDL Chol Calc Piccolo,Waive: 62 mg/dL — ABNORMAL HIGH (ref <30)

## 2018-07-28 LAB — MICROALBUMIN, URINE WAIVED
Creatinine, Urine Waived: 100 mg/dL (ref 10–300)
Microalb, Ur Waived: 10 mg/L (ref 0–19)
Microalb/Creat Ratio: <30 mg/g (ref <30)

## 2018-07-28 LAB — BAYER DCA HB A1C WAIVED: HB A1C (BAYER DCA - WAIVED): 11.7 % — ABNORMAL HIGH (ref <7.0)

## 2018-07-28 MED ORDER — LORATADINE 10 MG PO TABS: 10.0000 mg | ORAL_TABLET | Freq: Every day | ORAL | 3 refills | Status: DC

## 2018-07-28 MED ORDER — METFORMIN HCL 1000 MG PO TABS: ORAL_TABLET | ORAL | 5 refills | Status: DC

## 2018-07-28 MED ORDER — ATORVASTATIN CALCIUM 10 MG PO TABS: ORAL_TABLET | ORAL | 2 refills | Status: DC

## 2018-07-28 MED ORDER — OMEPRAZOLE 20 MG PO CPDR: 20.0000 mg | DELAYED_RELEASE_CAPSULE | Freq: Every day | ORAL | 2 refills | Status: DC

## 2018-07-28 MED ORDER — EMPAGLIFLOZIN 10 MG PO TABS: 10.0000 mg | ORAL_TABLET | Freq: Every day | ORAL | 3 refills | Status: DC

## 2018-07-28 NOTE — Patient Instructions (Signed)
Diabetes Mellitus and Nutrition, Adult  When you have diabetes (diabetes mellitus), it is very important to have healthy eating habits because your blood sugar (glucose) levels are greatly affected by what you eat and drink. Eating healthy foods in the appropriate amounts, at about the same times every day, can help you:  · Control your blood glucose.  · Lower your risk of heart disease.  · Improve your blood pressure.  · Reach or maintain a healthy weight.  Every person with diabetes is different, and each person has different needs for a meal plan. Your health care provider may recommend that you work with a diet and nutrition specialist (dietitian) to make a meal plan that is best for you. Your meal plan may vary depending on factors such as:  · The calories you need.  · The medicines you take.  · Your weight.  · Your blood glucose, blood pressure, and cholesterol levels.  · Your activity level.  · Other health conditions you have, such as heart or kidney disease.  How do carbohydrates affect me?  Carbohydrates, also called carbs, affect your blood glucose level more than any other type of food. Eating carbs naturally raises the amount of glucose in your blood. Carb counting is a method for keeping track of how many carbs you eat. Counting carbs is important to keep your blood glucose at a healthy level, especially if you use insulin or take certain oral diabetes medicines.  It is important to know how many carbs you can safely have in each meal. This is different for every person. Your dietitian can help you calculate how many carbs you should have at each meal and for each snack.  Foods that contain carbs include:  · Bread, cereal, rice, pasta, and crackers.  · Potatoes and corn.  · Peas, beans, and lentils.  · Milk and yogurt.  · Fruit and juice.  · Desserts, such as cakes, cookies, ice cream, and candy.  How does alcohol affect me?  Alcohol can cause a sudden decrease in blood glucose (hypoglycemia),  especially if you use insulin or take certain oral diabetes medicines. Hypoglycemia can be a life-threatening condition. Symptoms of hypoglycemia (sleepiness, dizziness, and confusion) are similar to symptoms of having too much alcohol.  If your health care provider says that alcohol is safe for you, follow these guidelines:  · Limit alcohol intake to no more than 1 drink per day for nonpregnant women and 2 drinks per day for men. One drink equals 12 oz of beer, 5 oz of wine, or 1½ oz of hard liquor.  · Do not drink on an empty stomach.  · Keep yourself hydrated with water, diet soda, or unsweetened iced tea.  · Keep in mind that regular soda, juice, and other mixers may contain a lot of sugar and must be counted as carbs.  What are tips for following this plan?    Reading food labels  · Start by checking the serving size on the "Nutrition Facts" label of packaged foods and drinks. The amount of calories, carbs, fats, and other nutrients listed on the label is based on one serving of the item. Many items contain more than one serving per package.  · Check the total grams (g) of carbs in one serving. You can calculate the number of servings of carbs in one serving by dividing the total carbs by 15. For example, if a food has 30 g of total carbs, it would be equal to 2   servings of carbs.  · Check the number of grams (g) of saturated and trans fats in one serving. Choose foods that have low or no amount of these fats.  · Check the number of milligrams (mg) of salt (sodium) in one serving. Most people should limit total sodium intake to less than 2,300 mg per day.  · Always check the nutrition information of foods labeled as "low-fat" or "nonfat". These foods may be higher in added sugar or refined carbs and should be avoided.  · Talk to your dietitian to identify your daily goals for nutrients listed on the label.  Shopping  · Avoid buying canned, premade, or processed foods. These foods tend to be high in fat, sodium,  and added sugar.  · Shop around the outside edge of the grocery store. This includes fresh fruits and vegetables, bulk grains, fresh meats, and fresh dairy.  Cooking  · Use low-heat cooking methods, such as baking, instead of high-heat cooking methods like deep frying.  · Cook using healthy oils, such as olive, canola, or sunflower oil.  · Avoid cooking with butter, cream, or high-fat meats.  Meal planning  · Eat meals and snacks regularly, preferably at the same times every day. Avoid going long periods of time without eating.  · Eat foods high in fiber, such as fresh fruits, vegetables, beans, and whole grains. Talk to your dietitian about how many servings of carbs you can eat at each meal.  · Eat 4-6 ounces (oz) of lean protein each day, such as lean meat, chicken, fish, eggs, or tofu. One oz of lean protein is equal to:  ? 1 oz of meat, chicken, or fish.  ? 1 egg.  ? ¼ cup of tofu.  · Eat some foods each day that contain healthy fats, such as avocado, nuts, seeds, and fish.  Lifestyle  · Check your blood glucose regularly.  · Exercise regularly as told by your health care provider. This may include:  ? 150 minutes of moderate-intensity or vigorous-intensity exercise each week. This could be brisk walking, biking, or water aerobics.  ? Stretching and doing strength exercises, such as yoga or weightlifting, at least 2 times a week.  · Take medicines as told by your health care provider.  · Do not use any products that contain nicotine or tobacco, such as cigarettes and e-cigarettes. If you need help quitting, ask your health care provider.  · Work with a counselor or diabetes educator to identify strategies to manage stress and any emotional and social challenges.  Questions to ask a health care provider  · Do I need to meet with a diabetes educator?  · Do I need to meet with a dietitian?  · What number can I call if I have questions?  · When are the best times to check my blood glucose?  Where to find more  information:  · American Diabetes Association: diabetes.org  · Academy of Nutrition and Dietetics: www.eatright.org  · National Institute of Diabetes and Digestive and Kidney Diseases (NIH): www.niddk.nih.gov  Summary  · A healthy meal plan will help you control your blood glucose and maintain a healthy lifestyle.  · Working with a diet and nutrition specialist (dietitian) can help you make a meal plan that is best for you.  · Keep in mind that carbohydrates (carbs) and alcohol have immediate effects on your blood glucose levels. It is important to count carbs and to use alcohol carefully.  This information is not intended to   replace advice given to you by your health care provider. Make sure you discuss any questions you have with your health care provider.  Document Released: 11/01/2004 Document Revised: 09/04/2016 Document Reviewed: 03/11/2016  Elsevier Interactive Patient Education © 2019 [employer]

## 2018-07-28 NOTE — Assessment & Plan Note (Signed)
CMP today

## 2018-07-28 NOTE — Assessment & Plan Note (Signed)
Chronic, ongoing.  Continue current medication regimen, Prilosec.  Check mag level today.

## 2018-07-28 NOTE — Assessment & Plan Note (Signed)
Chronic, stable with BP below goal.  Continue current medication regimen.  Recommend checking BP at home three mornings a week and documenting for provider review.  CMP and CBC today.

## 2018-07-28 NOTE — Assessment & Plan Note (Addendum)
Chronic, ongoing.  Continue current medication regimen and adjust as needed.  Lipid panel LDL 61 and TCHOL 159.  CMP obtained to check on LFT.

## 2018-07-28 NOTE — Assessment & Plan Note (Addendum)
Chronic, ongoing with poor compliance.  A1C 11.7% today.  Continue Metformin 1000 M BID, may consider switch to XR which may cause less GI distress.  Add on Jardiance 10 MG daily.  Recommend bringing glucometer to office to assess function as should be checking BS at least twice a day.  CCM referral placed for further recommendations and assistance in education with this patient.  Urine ALB 10 on exam today. Return in 4 weeks to assess tolerance of Jardiance.

## 2018-07-28 NOTE — Progress Notes (Addendum)
BP 118/79   Pulse 87   Temp 98.6 F (37 C) (Oral)   LMP  (LMP Unknown) Comment: age 68  SpO2 98%    Subjective:    Patient ID: Cheryl Mendez, female    DOB: 12/15/1950, 68 y.o.   MRN: [id number]  HPI: Cheryl Mendez is a 68 y.o. female  Chief Complaint  Patient presents with  . Diabetes  . Hyperlipidemia  . Hypertension   DIABETES Last A1C was in July 2019 which was 8.1%.  Currently taking Metformin 1000 MG twice a day, reports the two makes her "belly bother me and I talked to previous provider about this and so we decided we would take 1 1/2 or 1 tablets daily".  Recently got new machine, but can not get it to work.  States she can not get it to take blood.  A1C today is 11.7%.  At this time is taking whole pill in morning and 1/2 pill in evening, had only been taking one pill in past month prior to refill.  Previously has been on Trulicity, which she reports caused GI issues.  Has tried Invokana and did not like how it made her toes feels and was concerned about label stating she could lose toes, so they stopped this.  Discussed addition of Jardiance and reviewed medication with her + provided education, she agreed with this plan of care. Encouraged her to bring in glucometer so we can check to assess function.  Discussed CCM team and she is interested in referral "as long as they don't have to come out to my house".  Educated her on program.   Hypoglycemic episodes:no Polydipsia/polyuria: no Visual disturbance: no Chest pain: no Paresthesias: no Glucose Monitoring: no  Accucheck frequency: Not Checking  Fasting glucose:  Post prandial:  Evening:  Before meals: Taking Insulin?: no  Long acting insulin:  Short acting insulin: Blood Pressure Monitoring: not checking Retinal Examination: Up to Date Foot Exam: Up to Date Pneumovax: Up to Date Influenza: Up to Date Aspirin: yes   HYPERTENSION / HYPERLIPIDEMIA Currently taking Lotensin 20-25 and Atorvastatin 10 MG.  Satisfied with current treatment? yes Duration of hypertension: chronic BP monitoring frequency: not checking BP range:  BP medication side effects: no Duration of hyperlipidemia: chronic Cholesterol medication side effects: no Cholesterol supplements: none Medication compliance: good compliance Aspirin: yes Recent stressors: no Recurrent headaches: no Visual changes: no Palpitations: no Dyspnea: no Chest pain: no Lower extremity edema: no Dizzy/lightheaded: no  GERD Takes Prilosec 20 MG daily. GERD control status: stable  Satisfied with current treatment? yes Heartburn frequency: once a week Medication side effects: no  Medication compliance: fluctuating Previous GERD medications: Antacid use frequency:  none Duration: 30 minutes Nature: burning Location: epigastric Heartburn duration:  Alleviatiating factors:  prilosec Aggravating factors: foods and Metformin Dysphagia: no Odynophagia:  no Hematemesis: no Blood in stool: no EGD: yes, states they told her "my stomach spasms sometimes and the Metformin causes that"   Relevant past medical, surgical, family and social history reviewed and updated as indicated. Interim medical history since our last visit reviewed. Allergies and medications reviewed and updated.  Review of Systems  Constitutional: Negative for activity change, appetite change, diaphoresis, fatigue and fever.  Respiratory: Negative for cough, chest tightness and shortness of breath.   Cardiovascular: Negative for chest pain, palpitations and leg swelling.  Gastrointestinal: Negative for abdominal distention, abdominal pain, constipation, diarrhea, nausea and vomiting.  Endocrine: Negative for cold intolerance, heat intolerance, polydipsia, polyphagia and  polyuria.  Neurological: Negative for dizziness, syncope, weakness, light-headedness, numbness and headaches.  Psychiatric/Behavioral: Negative.     Per HPI unless specifically indicated above      Objective:    BP 118/79   Pulse 87   Temp 98.6 F (37 C) (Oral)   LMP  (LMP Unknown) Comment: age 65  SpO2 98%   Wt Readings from Last 3 Encounters:  11/07/17 170 lb 11.2 oz (77.4 kg)  10/17/17 175 lb 4.8 oz (79.5 kg)  09/16/17 170 lb 4.8 oz (77.2 kg)    Physical Exam Vitals signs and nursing note reviewed.  Constitutional:      General: She is awake.     Appearance: She is well-developed.  HENT:     Head: Normocephalic.     Right Ear: Hearing normal.     Left Ear: Hearing normal.     Nose: Nose normal.     Mouth/Throat:     Mouth: Mucous membranes are moist.  Eyes:     General: Lids are normal.        Right eye: No discharge.        Left eye: No discharge.     Conjunctiva/sclera: Conjunctivae normal.     Pupils: Pupils are equal, round, and reactive to light.  Neck:     Musculoskeletal: Normal range of motion and neck supple.     Thyroid: No thyromegaly.     Vascular: No carotid bruit.  Cardiovascular:     Rate and Rhythm: Normal rate and regular rhythm.     Heart sounds: Normal heart sounds. No murmur. No gallop.   Pulmonary:     Effort: Pulmonary effort is normal.     Breath sounds: Normal breath sounds.  Abdominal:     General: Bowel sounds are normal.     Palpations: Abdomen is soft. There is no hepatomegaly or splenomegaly.  Musculoskeletal:     Right lower leg: No edema.     Left lower leg: No edema.  Lymphadenopathy:     Cervical: No cervical adenopathy.  Skin:    General: Skin is warm and dry.  Neurological:     Mental Status: She is alert and oriented to person, place, and time.  Psychiatric:        Attention and Perception: Attention normal.        Mood and Affect: Mood normal.        Behavior: Behavior normal. Behavior is cooperative.        Thought Content: Thought content normal.        Judgment: Judgment normal.    Diabetic Foot Exam - Simple   Simple Foot Form Visual Inspection See comments:  Yes Sensation Testing Intact to touch and  monofilament testing bilaterally:  Yes Pulse Check Posterior Tibialis and Dorsalis pulse intact bilaterally:  Yes Comments Varicose veins to bilateral feet.     Results for orders placed or performed in visit on 09/16/17  Comprehensive metabolic panel  Result Value Ref Range   Glucose 112 (H) 65 - 99 mg/dL   BUN 9 8 - 27 mg/dL   Creatinine, Ser 0.60 0.57 - 1.00 mg/dL   GFR calc non Af Amer 95 >59 mL/min/1.73   GFR calc Af Amer 109 >59 mL/min/1.73   BUN/Creatinine Ratio 15 12 - 28   Sodium 141 134 - 144 mmol/L   Potassium 3.9 3.5 - 5.2 mmol/L   Chloride 96 96 - 106 mmol/L   CO2 25 20 - 29 mmol/L   Calcium  10.3 8.7 - 10.3 mg/dL   Total Protein 7.5 6.0 - 8.5 g/dL   Albumin 4.7 3.6 - 4.8 g/dL   Globulin, Total 2.8 1.5 - 4.5 g/dL   Albumin/Globulin Ratio 1.7 1.2 - 2.2   Bilirubin Total 0.5 0.0 - 1.2 mg/dL   Alkaline Phosphatase 47 39 - 117 IU/L   AST 41 (H) 0 - 40 IU/L   ALT 40 (H) 0 - 32 IU/L  Lipid Panel w/o Chol/HDL Ratio  Result Value Ref Range   Cholesterol, Total 162 100 - 199 mg/dL   Triglycerides 208 (H) 0 - 149 mg/dL   HDL 43 >39 mg/dL   VLDL Cholesterol Cal 42 (H) 5 - 40 mg/dL   LDL Calculated 77 0 - 99 mg/dL  Bayer DCA Hb A1c Waived  Result Value Ref Range   HB A1C (BAYER DCA - WAIVED) 8.1 (H) <7.0 %      Assessment & Plan:   Problem List Items Addressed This Visit      Cardiovascular and Mediastinum   Hypertension    Chronic, stable with BP below goal.  Continue current medication regimen.  Recommend checking BP at home three mornings a week and documenting for provider review.  CMP and CBC today.      Relevant Medications   atorvastatin (LIPITOR) 10 MG tablet   aspirin 81 MG chewable tablet   Other Relevant Orders   Comprehensive metabolic panel   CBC with Differential/Platelet     Digestive   GERD (gastroesophageal reflux disease)    Chronic, ongoing.  Continue current medication regimen, Prilosec.  Check mag level today.        Relevant  Medications   omeprazole (PRILOSEC) 20 MG capsule   Other Relevant Orders   Magnesium     Endocrine   Hyperlipidemia associated with type 2 diabetes mellitus (HCC)    Chronic, ongoing.  Continue current medication regimen and adjust as needed.  Lipid panel LDL 61 and TCHOL 159.  CMP obtained to check on LFT.        Relevant Medications   atorvastatin (LIPITOR) 10 MG tablet   metFORMIN (GLUCOPHAGE) 1000 MG tablet   aspirin 81 MG chewable tablet   empagliflozin (JARDIANCE) 10 MG TABS tablet   Other Relevant Orders   Lipid Panel Piccolo, Waived   Comprehensive metabolic panel   Poorly controlled type 2 diabetes mellitus ([place]) - Primary    Chronic, ongoing with poor compliance.  A1C 11.7% today.  Continue Metformin 1000 M BID, may consider switch to XR which may cause less GI distress.  Add on Jardiance 10 MG daily.  Recommend bringing glucometer to office to assess function as should be checking BS at least twice a day.  CCM referral placed for further recommendations and assistance in education with this patient.  Urine ALB 10 on exam today. Return in 4 weeks to assess tolerance of Jardiance.      Relevant Medications   atorvastatin (LIPITOR) 10 MG tablet   metFORMIN (GLUCOPHAGE) 1000 MG tablet   aspirin 81 MG chewable tablet   empagliflozin (JARDIANCE) 10 MG TABS tablet   Other Relevant Orders   Bayer DCA Hb A1c Waived   Microalbumin, Urine Waived   Ambulatory referral to [facility]     Other   Elevated liver enzymes    CMP today      Relevant Orders   Comprehensive metabolic panel    Other Visit Diagnoses    Thyroid disorder screen  Check TSH today   Relevant Orders   TSH       Follow up plan: Return in about 4 weeks (around 08/25/2018) for Blood sugar check.

## 2018-07-29 LAB — CBC WITH DIFFERENTIAL/PLATELET
Basophils Absolute: 0.1 10*3/uL (ref 0.0–0.2)
Basos: 1 %
EOS (ABSOLUTE): 0.2 10*3/uL (ref 0.0–0.4)
Eos: 3 %
Hematocrit: 43.5 % (ref 34.0–46.6)
Hemoglobin: 14 g/dL (ref 11.1–15.9)
Immature Grans (Abs): 0 10*3/uL (ref 0.0–0.1)
Immature Granulocytes: 0 %
Lymphocytes Absolute: 3.2 10*3/uL — ABNORMAL HIGH (ref 0.7–3.1)
Lymphs: 37 %
MCH: 28.6 pg (ref 26.6–33.0)
MCHC: 32.2 g/dL (ref 31.5–35.7)
MCV: 89 fL (ref 79–97)
Monocytes Absolute: 0.5 10*3/uL (ref 0.1–0.9)
Monocytes: 6 %
Neutrophils Absolute: 4.6 10*3/uL (ref 1.4–7.0)
Neutrophils: 53 %
Platelets: 325 10*3/uL (ref 150–450)
RBC: 4.89 x10E6/uL (ref 3.77–5.28)
RDW: 12.1 % (ref 11.7–15.4)
WBC: 8.7 10*3/uL (ref 3.4–10.8)

## 2018-07-29 LAB — COMPREHENSIVE METABOLIC PANEL
ALT: 38 IU/L — ABNORMAL HIGH (ref 0–32)
AST: 31 IU/L (ref 0–40)
Albumin/Globulin Ratio: 1.7 (ref 1.2–2.2)
Albumin: 4.4 g/dL (ref 3.8–4.8)
Alkaline Phosphatase: 54 IU/L (ref 39–117)
BUN/Creatinine Ratio: 24 (ref 12–28)
BUN: 17 mg/dL (ref 8–27)
Bilirubin Total: 0.5 mg/dL (ref 0.0–1.2)
CO2: 23 mmol/L (ref 20–29)
Calcium: 10 mg/dL (ref 8.7–10.3)
Chloride: 96 mmol/L (ref 96–106)
Creatinine, Ser: 0.72 mg/dL (ref 0.57–1.00)
GFR calc Af Amer: 100 mL/min/{1.73_m2} (ref >59)
GFR calc non Af Amer: 86 mL/min/{1.73_m2} (ref >59)
Globulin, Total: 2.6 g/dL (ref 1.5–4.5)
Glucose: 294 mg/dL — ABNORMAL HIGH (ref 65–99)
Potassium: 4.1 mmol/L (ref 3.5–5.2)
Sodium: 139 mmol/L (ref 134–144)
Total Protein: 7 g/dL (ref 6.0–8.5)

## 2018-07-29 LAB — TSH: TSH: 4.43 u[IU]/mL (ref 0.450–4.500)

## 2018-07-29 LAB — MAGNESIUM: Magnesium: 1.8 mg/dL (ref 1.6–2.3)

## 2018-08-05 ENCOUNTER — Ambulatory Visit (INDEPENDENT_AMBULATORY_CARE_PROVIDER_SITE_OTHER): Payer: Medicare Other | Admitting: Pharmacist

## 2018-08-05 DIAGNOSIS — E1165 Type 2 diabetes mellitus with hyperglycemia: Secondary | ICD-10-CM | POA: Diagnosis not present

## 2018-08-05 NOTE — Patient Instructions (Addendum)
Visit Information  Goals      Patient Stated   . "I need to work on my diabetes" (pt-stated)     Current Barriers:  Marland Kitchen Knowledge Deficits related to management of T2DM;  most recent A1c 11.2% . Reports hx tx with Invokana, but "made my toes feel strange" and was concerned about amputation risk, used Trulicity with success and ~10 lb weight loss, but "my stomach stayed upset". Only took for ~1 month . Has not been checking blood sugars. Previously had a One Touch Ultra meter, but insurance required switch to One Touch Ultra 2, and she and her sister have been unable to figure out how to use; keep getting error messages. Have not tried new batteries, have not taken back to the pharmacy for help . Confirms polyuria, polydipsia, nocturia significantly increased overt the past few months . States that she was told by GI that metformin was causing stomach cramps and slowed digestion  Pharmacist Clinical Goal(s):  Marland Kitchen Over the next 90 days, patient will work with PharmD and primary care provider to address needs related to improved diabetes control  Interventions: . Comprehensive medication review performed. . Discussed that Jardiance + metformin may not get patient to goal A1c <7%. She noted she did not want to be on three medications for diabetes, but wants [place] to continue to monitor and see how her blood sugars respond to Jardiance addition . Patient will take glucometer to [facility] tomorrow to ask the pharmacist to help her troubleshoot the error messages, and/or replace with a different meter. If they are unwilling to help, she will bring the meter to clinic and I can help  Patient Self Care Activities:  . Currently UNABLE TO independently use glucometer  Initial goal documentation        The patient verbalized understanding of instructions provided today and declined a print copy of patient instruction materials.   Plan: - Will contact patient in 2-7 business days to see if the pharmacy  could help her use her new glucometer - Will continue to support patient with medication management assistance   Catie Darnelle Maffucci, PharmD Clinical Pharmacist [place] [phone]

## 2018-08-05 NOTE — Chronic Care Management (AMB) (Signed)
Chronic Care Management   Note  08/05/2018 Name: Cheryl Mendez MRN: [id number] DOB: 12/15/1950   Subjective:  Cheryl Mendez is a 68 y.o. year old female who is a primary care patient of Cannady, Barbaraann Faster, NP. The CCM team was consulted for assistance with chronic disease management and care coordination needs.     Cheryl Mendez was given information about Chronic Care Management services today including:  1. CCM service includes personalized support from designated clinical staff supervised by her physician, including individualized plan of care and coordination with other care providers 2. 24/7 contact phone numbers for assistance for urgent and routine care needs. 3. Service will only be billed when office clinical staff spend 20 minutes or more in a month to coordinate care. 4. Only one practitioner may furnish and bill the service in a calendar month. 5. The patient may stop CCM services at any time (effective at the end of the month) by phone call to the office staff. 6. The patient will be responsible for cost sharing (co-pay) of up to 20% of the service fee (after annual deductible is met).  Patient agreed to services and verbal consent obtained.   Review of patient status, including review of consultants reports, laboratory and other test data, was performed as part of comprehensive evaluation and provision of chronic care management services.   Objective:  Lab Results  Component Value Date   CREATININE 0.72 07/28/2018   CREATININE 0.60 09/16/2017   CREATININE 0.58 05/07/2017    Lab Results  Component Value Date   HGBA1C 11.7 (H) 07/28/2018       Component Value Date/Time   CHOL 159 07/28/2018 1039   TRIG 308 (H) 07/28/2018 1039   HDL 43 09/16/2017 1633   VLDL 62 (H) 07/28/2018 1039   LDLCALC 77 09/16/2017 1633    Clinical ASCVD: No  The 10-year ASCVD risk score [name] Jr., et al., 2013) is: 16.6%   Values used to calculate the score:     Age: 75 years  Sex: Female     Is Non-Hispanic African American: No     Diabetic: Yes     Tobacco smoker: No     Systolic Blood Pressure: 500 mmHg     Is BP treated: Yes     HDL Cholesterol: 37 mg/dL     Total Cholesterol: 159 mg/dL    BP Readings from Last 3 Encounters:  07/28/18 118/79  11/07/17 (!) 146/82  10/17/17 135/88    Allergies  Allergen Reactions  . Penicillin G Benzathine     Medications Reviewed Today    Reviewed by De Hollingshead, [facility] (Pharmacist) on 08/05/18 at 1423  Med List Status: <None>  Medication Order Taking? Sig Documenting Provider Last Dose Status Informant  aspirin 81 MG chewable tablet [id number] Yes Chew 81 mg by mouth daily. [provider] Taking Active   aspirin-acetaminophen-caffeine (EXCEDRIN MIGRAINE) [id number] MG tablet [id number] Yes Take 1 tablet by mouth every 6 (six) hours as needed for headache. [provider] Taking Active            Med Note Darnelle Maffucci, Arville Lime   Wed Aug 05, 2018  2:18 PM) 1-2 times per week  atorvastatin (LIPITOR) 10 MG tablet [id number]  TAKE 1 TABLET(10 MG) BY MOUTH DAILY Cannady, Jolene T, NP  Active   benazepril-hydrochlorthiazide (LOTENSIN HCT) 20-25 MG tablet [id number] Yes Take 1 tablet by mouth daily. For further refills needs appointment. Venita Lick, NP Taking Active  blood glucose meter kit and supplies [id number] Yes Dispense based on patient and insurance preference. Use up to four times daily as directed. (FOR ICD-9 250.00, 250.01). Kathrine Haddock, NP Taking Active        Patient not taking:      Discontinued 08/05/18 1422 (Patient Preference)   empagliflozin (JARDIANCE) 10 MG TABS tablet [id number] Yes Take 10 mg by mouth daily. Venita Lick, NP Taking Active        Patient not taking:      Discontinued 08/05/18 1423 (Patient Preference)   glucose blood (ONE TOUCH ULTRA TEST) test strip [id number] Yes USE UP TO FOUR TIMES DAILY AS DIRECTED Kathrine Haddock, NP Taking Active   loratadine  (CLARITIN) 10 MG tablet [id number] Yes Take 1 tablet (10 mg total) by mouth daily. Marnee Guarneri T, NP Taking Active   metFORMIN (GLUCOPHAGE) 1000 MG tablet [id number] Yes TAKE 1 TABLET(1000 MG) BY MOUTH TWICE DAILY WITH A MEAL Cannady, Jolene T, NP Taking Active   omeprazole (PRILOSEC) 20 MG capsule [id number] Yes Take 1 capsule (20 mg total) by mouth daily.  Patient taking differently: Take 20 mg by mouth daily as needed.    Venita Lick, NP Taking Active   [facility] DELICA LANCETS FINE MISC [id number] Yes USE UP TO FOUR TIMES DAILY AS DIRECTED Kathrine Haddock, NP Taking Active            Assessment:   Goals Addressed            This Visit's Progress     Patient Stated   . "I need to work on my diabetes" (pt-stated)       Current Barriers:  Marland Kitchen Knowledge Deficits related to management of T2DM;  most recent A1c 11.2% . Reports hx tx with Invokana, but "made my toes feel strange" and was concerned about amputation risk, used Trulicity with success and ~10 lb weight loss, but "my stomach stayed upset". Only took for ~1 month . Has not been checking blood sugars. Previously had a One Touch Ultra meter, but insurance required switch to One Touch Ultra 2, and she and her sister have been unable to figure out how to use; keep getting error messages. Have not tried new batteries, have not taken back to the pharmacy for help . Confirms polyuria, polydipsia, nocturia significantly increased overt the past few months . States that she was told by GI that metformin was causing stomach cramps and slowed digestion  Pharmacist Clinical Goal(s):  Marland Kitchen Over the next 90 days, patient will work with PharmD and primary care provider to address needs related to improved diabetes control  Interventions: . Comprehensive medication review performed. . Discussed that Jardiance + metformin may not get patient to goal A1c <7%. She noted she did not want to be on three medications for diabetes, but wants [place] to  continue to monitor and see how her blood sugars respond to Jardiance addition . Patient will take glucometer to [facility] tomorrow to ask the pharmacist to help her troubleshoot the error messages, and/or replace with a different meter. If they are unwilling to help, she will bring the meter to clinic and I can help  Patient Self Care Activities:  . Currently UNABLE TO independently use glucometer  Initial goal documentation        Plan: - Will contact patient in 2-7 business days to see if the pharmacy could help her use her new glucometer - Will continue to support patient with medication management assistance  Catie Darnelle Maffucci, PharmD Clinical Pharmacist [place] [phone]

## 2018-08-07 ENCOUNTER — Telehealth: Payer: Self-pay

## 2018-08-11 ENCOUNTER — Telehealth: Payer: Self-pay

## 2018-08-11 ENCOUNTER — Ambulatory Visit: Payer: Self-pay | Admitting: Pharmacist

## 2018-08-11 NOTE — Chronic Care Management (AMB) (Signed)
  Chronic Care Management   Note  08/11/2018 Name: Cheryl Mendez MRN: [id number] DOB: 03/18/1950  Cheryl Mendez is a 68 y.o. year old female who is a primary care patient of Cannady, Barbaraann Faster, NP. The CCM team was consulted for assistance with chronic disease management and care coordination needs.    Attempted to contact patient to follow up on getting glucometer checked at [facility]. Was unable to leave a message on her number.   Follow up plan: If I do not hear back, will follow up in 2-4 weeks  Catie Darnelle Maffucci, PharmD Clinical Pharmacist [place] [phone]

## 2018-08-25 ENCOUNTER — Ambulatory Visit: Payer: Medicare Other | Admitting: Nurse Practitioner

## 2018-08-25 ENCOUNTER — Telehealth: Payer: Self-pay

## 2018-08-25 ENCOUNTER — Ambulatory Visit: Payer: Self-pay | Admitting: Pharmacist

## 2018-08-25 NOTE — Chronic Care Management (AMB) (Signed)
  Chronic Care Management   Note  08/25/2018 Name: RANAE CASEBIER MRN: [id number] DOB: [birth year]  SHERMAINE RIVET is a 68 y.o. year old female who is a primary care patient of Cannady, Barbaraann Faster, NP. The CCM team was consulted for assistance with chronic disease management and care coordination needs.    Contacted patient to follow up on if she was able to have [facility] pharmacist assist her in fixing her glucometer. Left HIPAA compliant message for her to return my call.   Follow up plan: - If I do not hear back, will outreach again in 2-3 weeks  Catie Darnelle Maffucci, PharmD Clinical Pharmacist [place] [phone]

## 2018-08-27 ENCOUNTER — Other Ambulatory Visit: Payer: Self-pay | Admitting: Nurse Practitioner

## 2018-09-11 ENCOUNTER — Ambulatory Visit: Payer: Self-pay | Admitting: Pharmacist

## 2018-09-11 ENCOUNTER — Telehealth: Payer: Self-pay

## 2018-09-11 NOTE — Chronic Care Management (AMB) (Signed)
  Chronic Care Management   Note  09/11/2018 Name: NUVIA HILEMAN MRN: [id number] DOB: 09-29-1950  CHERAE MARTON is a 68 y.o. year old female who is a primary care patient of Cannady, Barbaraann Faster, NP. The CCM team was consulted for assistance with chronic disease management and care coordination needs.    Third unsuccessful outreach attempt to contact patient. Left HIPAA compliant message asking her to return my call regarding her glucometer. I also reminded her that Marnee Guarneri had wanted to see her back in ~4 weeks, so encouraged her to contact the office to schedule an appointment.   Follow up plan: - CCM team will be available for any future needs  Catie Darnelle Maffucci, PharmD Clinical Pharmacist [place] [phone]

## 2018-12-01 ENCOUNTER — Other Ambulatory Visit: Payer: Self-pay

## 2018-12-01 MED ORDER — JARDIANCE 10 MG PO TABS: 10.0000 mg | ORAL_TABLET | Freq: Every day | ORAL | 3 refills | Status: DC

## 2018-12-01 NOTE — Telephone Encounter (Signed)
Patient last seen 07/28/18.

## 2019-01-11 ENCOUNTER — Other Ambulatory Visit: Payer: Self-pay

## 2019-01-11 MED ORDER — BENAZEPRIL-HYDROCHLOROTHIAZIDE 20-25 MG PO TABS: 1.0000 | ORAL_TABLET | Freq: Every day | ORAL | 0 refills | Status: DC

## 2019-01-11 NOTE — Telephone Encounter (Signed)
Has no follow-up and is due for diabetes follow-up and labs, please call to schedule.  Thank you.  Refill for 60 days only sent.

## 2019-01-11 NOTE — Telephone Encounter (Signed)
Patient last seen 07/28/18

## 2019-01-11 NOTE — Telephone Encounter (Signed)
Letter generated and sent to patient.  

## 2019-03-12 ENCOUNTER — Other Ambulatory Visit: Payer: Self-pay

## 2019-03-12 ENCOUNTER — Other Ambulatory Visit: Payer: Self-pay | Admitting: Nurse Practitioner

## 2019-03-12 MED ORDER — BENAZEPRIL-HYDROCHLOROTHIAZIDE 20-25 MG PO TABS: 1.0000 | ORAL_TABLET | Freq: Every day | ORAL | 0 refills | Status: DC

## 2019-03-12 NOTE — Telephone Encounter (Signed)
Refill request for Benazepril.  LOV 07/28/2018 Letter sent to patient in November to [place]/d F/U. No F/U scheduled.  Send another letter to patient?

## 2019-03-12 NOTE — Telephone Encounter (Signed)
Called patient and LVM for patient to call back and schedule appointment. DPR Reviewed. Another letter also sent to patient to [place]/d follow up appointment.

## 2019-03-12 NOTE — Telephone Encounter (Signed)
Will send 60 day supply only, but needs follow-up.

## 2019-03-24 ENCOUNTER — Other Ambulatory Visit: Payer: Self-pay | Admitting: Nurse Practitioner

## 2019-03-28 ENCOUNTER — Encounter: Payer: Self-pay | Admitting: Nurse Practitioner

## 2019-03-29 ENCOUNTER — Ambulatory Visit (INDEPENDENT_AMBULATORY_CARE_PROVIDER_SITE_OTHER): Payer: Medicare Other | Admitting: Nurse Practitioner

## 2019-03-29 ENCOUNTER — Other Ambulatory Visit: Payer: Self-pay

## 2019-03-29 ENCOUNTER — Telehealth: Payer: Self-pay | Admitting: Nurse Practitioner

## 2019-03-29 ENCOUNTER — Encounter: Payer: Self-pay | Admitting: Nurse Practitioner

## 2019-03-29 ENCOUNTER — Other Ambulatory Visit: Payer: Self-pay | Admitting: Nurse Practitioner

## 2019-03-29 VITALS — BP 122/78 | HR 84 | Temp 98.2°F

## 2019-03-29 DIAGNOSIS — Z23 Encounter for immunization: Secondary | ICD-10-CM

## 2019-03-29 DIAGNOSIS — R42 Dizziness and giddiness: Secondary | ICD-10-CM | POA: Diagnosis not present

## 2019-03-29 DIAGNOSIS — E538 Deficiency of other specified B group vitamins: Secondary | ICD-10-CM

## 2019-03-29 DIAGNOSIS — E1169 Type 2 diabetes mellitus with other specified complication: Secondary | ICD-10-CM

## 2019-03-29 DIAGNOSIS — E785 Hyperlipidemia, unspecified: Secondary | ICD-10-CM

## 2019-03-29 DIAGNOSIS — E1159 Type 2 diabetes mellitus with other circulatory complications: Secondary | ICD-10-CM

## 2019-03-29 DIAGNOSIS — E1165 Type 2 diabetes mellitus with hyperglycemia: Secondary | ICD-10-CM | POA: Diagnosis not present

## 2019-03-29 DIAGNOSIS — I152 Hypertension secondary to endocrine disorders: Secondary | ICD-10-CM

## 2019-03-29 DIAGNOSIS — I10 Essential (primary) hypertension: Secondary | ICD-10-CM

## 2019-03-29 LAB — BAYER DCA HB A1C WAIVED: HB A1C (BAYER DCA - WAIVED): 8.3 % — ABNORMAL HIGH (ref <7.0)

## 2019-03-29 LAB — MICROALBUMIN, URINE WAIVED
Creatinine, Urine Waived: 50 mg/dL (ref 10–300)
Microalb, Ur Waived: 10 mg/L (ref 0–19)
Microalb/Creat Ratio: <30 mg/g (ref <30)

## 2019-03-29 MED ORDER — METFORMIN HCL 1000 MG PO TABS: 1000.0000 mg | ORAL_TABLET | Freq: Two times a day (BID) | ORAL | 3 refills | Status: DC

## 2019-03-29 MED ORDER — BLOOD GLUCOSE MONITOR KIT: PACK | 0 refills | Status: DC

## 2019-03-29 MED ORDER — ATORVASTATIN CALCIUM 10 MG PO TABS: ORAL_TABLET | ORAL | 2 refills | Status: DC

## 2019-03-29 MED ORDER — BENAZEPRIL-HYDROCHLOROTHIAZIDE 20-25 MG PO TABS: 1.0000 | ORAL_TABLET | Freq: Every day | ORAL | 3 refills | Status: DC

## 2019-03-29 MED ORDER — JARDIANCE 25 MG PO TABS: 25.0000 mg | ORAL_TABLET | Freq: Every day | ORAL | 5 refills | Status: DC

## 2019-03-29 NOTE — Progress Notes (Signed)
BP 122/78   Pulse 84   Temp 98.2 F (36.8 C) (Oral)   LMP  (LMP Unknown) Comment: age 69  SpO2 95%    Subjective:    Patient ID: Cheryl Mendez, female    DOB: Sep 06, 1950, 69 y.o.   MRN: [id number]  HPI: Cheryl Mendez is a 69 y.o. female  Chief Complaint  Patient presents with  . Diabetes    Dr. Purvis [place] office sending last eye exam report over  . Hyperlipidemia  . Hypertension  . Dizziness    pt states she has been having some dizzy spells every now and then when she gets up in the mornings    DIABETES Last A1C was in June 2020 was 11.7%, has missed some appointments since this time and not followed up with CCM team.  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  Was started on Jardiance at last visit.  Taking Jardiance 10 MG and Metformin 1000 MG BID.   She has gone on a diet and not eating any bread.  Started in December. Previously has been on Trulicity, which she reports caused GI issues.  Her glucometer is broken. Hypoglycemic episodes:no Polydipsia/polyuria: no Visual disturbance: no Chest pain: no Paresthesias: no Glucose Monitoring: no             Accucheck frequency: Not Checking             Fasting glucose:             Post prandial:             Evening:             Before meals: Taking Insulin?: no             Long acting insulin:             Short acting insulin: Blood Pressure Monitoring: not checking Retinal Examination: Up to Date Foot Exam: Up to Date Pneumovax: Up to Date Influenza: Up to Date Aspirin: yes   HYPERTENSION / HYPERLIPIDEMIA Currently taking Lotensin 20-25 and Atorvastatin 10 MG. Satisfied with current treatment? yes Duration of hypertension: chronic BP monitoring frequency: not checking BP range:  BP medication side effects: no Duration of hyperlipidemia: chronic Cholesterol medication side effects: no Cholesterol supplements: none Medication compliance: good  compliance Aspirin: yes Recent stressors: no Recurrent headaches: no Visual changes: no Palpitations: no Dyspnea: no Chest pain: no Lower extremity edema: no Dizzy/lightheaded: no  DIZZINESS Notices this when getting out of bed, is used to getting out of bed quickly.  Now has slowed down and is taking time getting out of bed, which is helping.  Before this she would jump out of bed and go. Duration: weeks Description of symptoms: lightheaded Duration of episode: seconds Dizziness frequency: intermittent Provoking factors: getting out of bed Aggravating factors:  getting out of bed Triggered by rolling over in bed: no Triggered by bending over: no Aggravated by head movement: no Aggravated by exertion, coughing, loud noises: no Recent head injury: no Recent or current viral symptoms: no History of vasovagal episodes: no Nausea: no Vomiting: no Tinnitus: no Hearing loss: no Aural fullness: no Headache: no Photophobia/phonophobia: no Unsteady gait: no Postural instability: no Diplopia, dysarthria, dysphagia or weakness: no Related to exertion: no Pallor: no Diaphoresis: no Dyspnea: no Chest pain: no   Relevant past medical, surgical, family and social history reviewed and updated as  indicated. Interim medical history since our last visit reviewed. Allergies and medications reviewed and updated.  Review of Systems  Constitutional: Negative for activity change, appetite change, diaphoresis, fatigue and fever.  Respiratory: Negative for cough, chest tightness and shortness of breath.   Cardiovascular: Negative for chest pain, palpitations and leg swelling.  Gastrointestinal: Negative.   Endocrine: Negative for cold intolerance, heat intolerance, polydipsia, polyphagia and polyuria.  Neurological: Positive for dizziness. Negative for seizures, syncope, facial asymmetry, weakness, numbness and headaches.  Psychiatric/Behavioral: Negative.     Per HPI unless specifically  indicated above     Objective:    BP 122/78   Pulse 84   Temp 98.2 F (36.8 C) (Oral)   LMP  (LMP Unknown) Comment: age 31  SpO2 95%   Wt Readings from Last 3 Encounters:  11/07/17 170 lb 11.2 oz (77.4 kg)  10/17/17 175 lb 4.8 oz (79.5 kg)  09/16/17 170 lb 4.8 oz (77.2 kg)    Physical Exam Vitals and nursing note reviewed.  Constitutional:      General: She is awake. She is not in acute distress.    Appearance: She is well-developed and overweight. She is not ill-appearing.  HENT:     Head: Normocephalic.     Right Ear: Hearing normal.     Left Ear: Hearing normal.  Eyes:     General: Lids are normal.        Right eye: No discharge.        Left eye: No discharge.     Conjunctiva/sclera: Conjunctivae normal.     Pupils: Pupils are equal, round, and reactive to light.  Neck:     Thyroid: No thyromegaly.     Vascular: No carotid bruit or JVD.  Cardiovascular:     Rate and Rhythm: Normal rate and regular rhythm.     Heart sounds: Normal heart sounds. No murmur. No gallop.   Pulmonary:     Effort: Pulmonary effort is normal. No accessory muscle usage or respiratory distress.     Breath sounds: Normal breath sounds.  Abdominal:     General: Bowel sounds are normal.     Palpations: Abdomen is soft.  Musculoskeletal:     Cervical back: Normal range of motion and neck supple.     Right lower leg: No edema.     Left lower leg: No edema.  Lymphadenopathy:     Cervical: No cervical adenopathy.  Skin:    General: Skin is warm and dry.  Neurological:     Mental Status: She is alert and oriented to person, place, and time.     Cranial Nerves: Cranial nerves are intact.     Motor: Motor function is intact.     Coordination: Coordination is intact.     Gait: Gait is intact.     Deep Tendon Reflexes: Reflexes are normal and symmetric.     Reflex Scores:      Brachioradialis reflexes are 2+ on the right side and 2+ on the left side.      Patellar reflexes are 2+ on the right  side and 2+ on the left side. Psychiatric:        Attention and Perception: Attention normal.        Mood and Affect: Mood normal.        Behavior: Behavior normal. Behavior is cooperative.        Thought Content: Thought content normal.        Judgment: Judgment normal.  Results for orders placed or performed in visit on 07/28/18  Bayer DCA Hb A1c Waived  Result Value Ref Range   HB A1C (BAYER DCA - WAIVED) 11.7 (H) <7.0 %  Microalbumin, Urine Waived  Result Value Ref Range   Microalb, Ur Waived 10 0 - 19 mg/L   Creatinine, Urine Waived 100 10 - 300 mg/dL   Microalb/Creat Ratio <30 <30 mg/g  Lipid Panel Piccolo, Waived  Result Value Ref Range   Cholesterol Piccolo, Waived 159 <200 mg/dL   HDL Chol Piccolo, Waived 37 (L) >59 mg/dL   Triglycerides Piccolo,Waived 308 (H) <150 mg/dL   Chol/HDL Ratio Piccolo,Waive 4.3 mg/dL   LDL Chol Calc Piccolo Waived 61 <100 mg/dL   VLDL Chol Calc Piccolo,Waive 62 (H) <30 mg/dL  Comprehensive metabolic panel  Result Value Ref Range   Glucose 294 (H) 65 - 99 mg/dL   BUN 17 8 - 27 mg/dL   Creatinine, Ser 0.72 0.57 - 1.00 mg/dL   GFR calc non Af Amer 86 >59 mL/min/1.73   GFR calc Af Amer 100 >59 mL/min/1.73   BUN/Creatinine Ratio 24 12 - 28   Sodium 139 134 - 144 mmol/L   Potassium 4.1 3.5 - 5.2 mmol/L   Chloride 96 96 - 106 mmol/L   CO2 23 20 - 29 mmol/L   Calcium 10.0 8.7 - 10.3 mg/dL   Total Protein 7.0 6.0 - 8.5 g/dL   Albumin 4.4 3.8 - 4.8 g/dL   Globulin, Total 2.6 1.5 - 4.5 g/dL   Albumin/Globulin Ratio 1.7 1.2 - 2.2   Bilirubin Total 0.5 0.0 - 1.2 mg/dL   Alkaline Phosphatase 54 39 - 117 IU/L   AST 31 0 - 40 IU/L   ALT 38 (H) 0 - 32 IU/L  CBC with Differential/Platelet  Result Value Ref Range   WBC 8.7 3.4 - 10.8 x10E3/uL   RBC 4.89 3.77 - 5.28 x10E6/uL   Hemoglobin 14.0 11.1 - 15.9 g/dL   Hematocrit 43.5 34.0 - 46.6 %   MCV 89 79 - 97 fL   MCH 28.6 26.6 - 33.0 pg   MCHC 32.2 31.5 - 35.7 g/dL   RDW 12.1 11.7 - 15.4 %     Platelets 325 150 - 450 x10E3/uL   Neutrophils 53 Not Estab. %   Lymphs 37 Not Estab. %   Monocytes 6 Not Estab. %   Eos 3 Not Estab. %   Basos 1 Not Estab. %   Neutrophils Absolute 4.6 1.4 - 7.0 x10E3/uL   Lymphocytes Absolute 3.2 (H) 0.7 - 3.1 x10E3/uL   Monocytes Absolute 0.5 0.1 - 0.9 x10E3/uL   EOS (ABSOLUTE) 0.2 0.0 - 0.4 x10E3/uL   Basophils Absolute 0.1 0.0 - 0.2 x10E3/uL   Immature Granulocytes 0 Not Estab. %   Immature Grans (Abs) 0.0 0.0 - 0.1 x10E3/uL  TSH  Result Value Ref Range   TSH 4.430 0.450 - 4.500 uIU/mL  Magnesium  Result Value Ref Range   Magnesium 1.8 1.6 - 2.3 mg/dL      Assessment & Plan:   Problem List Items Addressed This Visit      Cardiovascular and Mediastinum   Hypertension associated with diabetes ([place])    Chronic, stable with BP below goal.  Continue current medication regimen and adjust as needed.  Recommend checking BP at home three mornings a week and documenting for provider review.  CMP today.       Relevant Medications   benazepril-hydrochlorthiazide (LOTENSIN HCT) 20-25 MG tablet  atorvastatin (LIPITOR) 10 MG tablet   metFORMIN (GLUCOPHAGE) 1000 MG tablet   empagliflozin (JARDIANCE) 25 MG TABS tablet   Other Relevant Orders   Bayer DCA Hb A1c Waived     Endocrine   Hyperlipidemia associated with type 2 diabetes mellitus (HCC)    Chronic, ongoing.  Continue current medication regimen and adjust as needed.  CMP obtained to check on LFT and lipid panel today.      Relevant Medications   benazepril-hydrochlorthiazide (LOTENSIN HCT) 20-25 MG tablet   atorvastatin (LIPITOR) 10 MG tablet   metFORMIN (GLUCOPHAGE) 1000 MG tablet   empagliflozin (JARDIANCE) 25 MG TABS tablet   Other Relevant Orders   Bayer DCA Hb A1c Waived   Comprehensive metabolic panel   Lipid Panel w/o Chol/HDL Ratio   Poorly controlled type 2 diabetes mellitus (HCC) - Primary    Chronic, ongoing with poor adherence to visits.  A1C 8.3% today, downward from  11.7% last visit.  Continue Metformin 1000 M BID and will increase Jardiance to 25 MG daily.  Continue diet changes at home.  New glucometer script provided.   CCM referral placed for further recommendations and assistance in education with this patient.  Urine ALB 10 and A:C <30 on exam today.  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      Relevant Medications   benazepril-hydrochlorthiazide (LOTENSIN HCT) 20-25 MG tablet   atorvastatin (LIPITOR) 10 MG tablet   metFORMIN (GLUCOPHAGE) 1000 MG tablet   empagliflozin (JARDIANCE) 25 MG TABS tablet   Other Relevant Orders   Bayer DCA Hb A1c Waived   Microalbumin, Urine Waived   Referral to Chronic Care Management Services     Other   Dizziness    Suspect related to orthostatic changes with position changes in morning.  Recommend getting up slowly in morning and sitting at side of bed for a few minutes before standing.  Increase hydration at home.  Monitor BP daily and document, if lower levels at home may consider adjusting BP medications.       Other Visit Diagnoses    B12 deficiency       Reports history of low level, will check this today and start supplement as needed.  Is on long term Metformin.   Relevant Orders   Vitamin B12   Need for influenza vaccination       Relevant Orders   Flu Vaccine QUAD High Dose(Fluad) (Completed)       Follow up plan: Return in about 3 months (around 06/26/2019) for T2DM, HTN/HLD.

## 2019-03-29 NOTE — Assessment & Plan Note (Signed)
Chronic, stable with BP below goal.  Continue current medication regimen and adjust as needed.  Recommend checking BP at home three mornings a week and documenting for provider review.  CMP today.

## 2019-03-29 NOTE — Assessment & Plan Note (Signed)
Chronic, ongoing with poor adherence to visits.  A1C 8.3% today, downward from 11.7% last visit.  Continue Metformin 1000 M BID and will increase Jardiance to 25 MG daily.  Continue diet changes at home.  New glucometer script provided.   CCM referral placed for further recommendations and assistance in education with this patient.  Urine ALB 10 and A:C <30 on exam today.  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

## 2019-03-29 NOTE — Chronic Care Management (AMB) (Signed)
  Chronic Care Management   Note  03/29/2019 Name: Cheryl Mendez MRN: [id number] DOB: 1950-11-08  Cheryl Mendez is a 69 y.o. year old female who is a primary care patient of Cannady, Barbaraann Faster, NP. Cheryl Mendez is currently enrolled in care management services. An additional referral for Pharm D was placed.   Follow up plan: Unsuccessful telephone outreach attempt made. A HIPPA compliant phone message was left for the patient providing contact information and requesting a return call.  The care management team will reach out to the patient again over the next 7 days.  If patient returns call to provider office, please advise to call Embedded Care Management Care Guide Glenna Durand LPN at [phone]  Nickeah Allen, LPN Health Advisor, [place] [facility] ??[email or web address] ??[phone]

## 2019-03-29 NOTE — Assessment & Plan Note (Signed)
Suspect related to orthostatic changes with position changes in morning.  Recommend getting up slowly in morning and sitting at side of bed for a few minutes before standing.  Increase hydration at home.  Monitor BP daily and document, if lower levels at home may consider adjusting BP medications.

## 2019-03-29 NOTE — Assessment & Plan Note (Signed)
Chronic, ongoing.  Continue current medication regimen and adjust as needed.  CMP obtained to check on LFT and lipid panel today.

## 2019-03-29 NOTE — Patient Instructions (Signed)

## 2019-03-30 LAB — LIPID PANEL W/O CHOL/HDL RATIO
Cholesterol, Total: 201 mg/dL — ABNORMAL HIGH (ref 100–199)
HDL: 42 mg/dL (ref >39)
LDL Chol Calc (NIH): 108 mg/dL — ABNORMAL HIGH (ref 0–99)
Triglycerides: 296 mg/dL — ABNORMAL HIGH (ref 0–149)
VLDL Cholesterol Cal: 51 mg/dL — ABNORMAL HIGH (ref 5–40)

## 2019-03-30 LAB — COMPREHENSIVE METABOLIC PANEL
ALT: 24 IU/L (ref 0–32)
AST: 21 IU/L (ref 0–40)
Albumin/Globulin Ratio: 1.7 (ref 1.2–2.2)
Albumin: 4.7 g/dL (ref 3.8–4.8)
Alkaline Phosphatase: 50 IU/L (ref 39–117)
BUN/Creatinine Ratio: 22 (ref 12–28)
BUN: 17 mg/dL (ref 8–27)
Bilirubin Total: 0.4 mg/dL (ref 0.0–1.2)
CO2: 24 mmol/L (ref 20–29)
Calcium: 10.4 mg/dL — ABNORMAL HIGH (ref 8.7–10.3)
Chloride: 97 mmol/L (ref 96–106)
Creatinine, Ser: 0.76 mg/dL (ref 0.57–1.00)
GFR calc Af Amer: 93 mL/min/{1.73_m2} (ref >59)
GFR calc non Af Amer: 81 mL/min/{1.73_m2} (ref >59)
Globulin, Total: 2.7 g/dL (ref 1.5–4.5)
Glucose: 154 mg/dL — ABNORMAL HIGH (ref 65–99)
Potassium: 3.9 mmol/L (ref 3.5–5.2)
Sodium: 138 mmol/L (ref 134–144)
Total Protein: 7.4 g/dL (ref 6.0–8.5)

## 2019-03-30 LAB — VITAMIN B12: Vitamin B-12: 367 pg/mL (ref 232–1245)

## 2019-03-30 NOTE — Progress Notes (Signed)
Please let Cheryl Mendez know her labs have returned.  Kidney and liver function remain stable. B12 is on low side of normal, would benefit her to take Vitamin B12 500 MCG daily.  She can find this in vitamin section, it is good for nervous system health.  Her cholesterol levels are above goal.  Is she taking Atorvastatin consistently, if not I want her to start doing so.  If she is taking consistently, it may benefit going up on dose to 20 MG.  Let me know and I can increase dose if needed.  Thank you.

## 2019-03-31 NOTE — Progress Notes (Signed)
Attempted to reach pt, left VM to CB. 

## 2019-04-02 ENCOUNTER — Telehealth: Payer: Self-pay | Admitting: *Deleted

## 2019-04-02 NOTE — Telephone Encounter (Signed)
Will route to Care Guide for assistance in scheduling.   Copied from [place] [phone]. Topic: Appointment Scheduling - Scheduling Inquiry for Clinic >> Apr 02, 2019  3:22 PM Lennox Solders wrote: Reason for CRM: pt is calling to make an appt with [facility]

## 2019-04-02 NOTE — Telephone Encounter (Signed)
Pt given result per Marnee Guarneri, "Please let Ms. Overby know her labs have returned. Kidney and liver function remain stable. B12 is on low side of normal, would benefit her to take Vitamin B12 500 MCG daily. She can find this in vitamin section, it is good for nervous system health. Her cholesterol levels are above goal. Is she taking Atorvastatin consistently, if not I want her to start doing so. If she is taking consistently, it may benefit going up on dose to 20 MG. 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

## 2019-04-02 NOTE — Telephone Encounter (Signed)
Noted, thank you

## 2019-04-05 ENCOUNTER — Telehealth: Payer: Self-pay | Admitting: Nurse Practitioner

## 2019-04-05 NOTE — Chronic Care Management (AMB) (Signed)
Chronic Care Management   Note  04/05/2019 Name: COLLIER YSAGUIRRE MRN: [id number] DOB: 1950/03/25  MICHILLE EAGLES is a 68 y.o. year old female who is a primary care patient of Cannady, Dorie Rank, NP. BERLA BOUEY is currently enrolled in care management services. An additional referral for Pharm D was placed.   Follow up plan: Unsuccessful telephone outreach attempt made. A HIPPA compliant phone message was left for the patient providing contact information and requesting a return call.  The care management team will reach out to the patient again over the next 7 days.   Elisha Ponder, LPN Health Advisor, Embedded Care Coordination [place] [facility] ??[email or web address] ??[phone]

## 2019-04-12 NOTE — Chronic Care Management (AMB) (Signed)
Chronic Care Management   Note  04/12/2019 Name: Cheryl Mendez MRN: [id number] DOB: 02-Dec-1950  Cheryl Mendez is a 69 y.o. year old female who is a primary care patient of Cannady, Dorie Rank, NP. Cheryl Mendez is currently enrolled in care management services. An additional referral for Pharm D was placed.   Follow up plan: The care management team is available to follow up with the patient after provider conversation with the patient regarding recommendation for care management engagement and subsequent re-referral to the care management team.   Elisha Ponder, LPN Health Advisor, Embedded Care Coordination [facility] Health Care Management ??[email or web address] ??[phone]

## 2019-06-28 ENCOUNTER — Telehealth: Payer: Self-pay

## 2019-06-28 ENCOUNTER — Other Ambulatory Visit: Payer: Self-pay

## 2019-06-28 ENCOUNTER — Encounter: Payer: Self-pay | Admitting: Nurse Practitioner

## 2019-06-28 ENCOUNTER — Ambulatory Visit (INDEPENDENT_AMBULATORY_CARE_PROVIDER_SITE_OTHER): Payer: Medicare Other | Admitting: Nurse Practitioner

## 2019-06-28 VITALS — BP 102/70 | HR 97 | Temp 98.4°F | Wt 171.8 lb

## 2019-06-28 DIAGNOSIS — E1169 Type 2 diabetes mellitus with other specified complication: Secondary | ICD-10-CM

## 2019-06-28 DIAGNOSIS — I10 Essential (primary) hypertension: Secondary | ICD-10-CM | POA: Diagnosis not present

## 2019-06-28 DIAGNOSIS — E1165 Type 2 diabetes mellitus with hyperglycemia: Secondary | ICD-10-CM

## 2019-06-28 DIAGNOSIS — E785 Hyperlipidemia, unspecified: Secondary | ICD-10-CM

## 2019-06-28 DIAGNOSIS — E1159 Type 2 diabetes mellitus with other circulatory complications: Secondary | ICD-10-CM

## 2019-06-28 DIAGNOSIS — I152 Hypertension secondary to endocrine disorders: Secondary | ICD-10-CM

## 2019-06-28 LAB — BAYER DCA HB A1C WAIVED: HB A1C (BAYER DCA - WAIVED): 8.8 % — ABNORMAL HIGH

## 2019-06-28 MED ORDER — OZEMPIC (0.25 OR 0.5 MG/DOSE) 2 MG/1.5ML ~~LOC~~ SOPN: 0.2500 mg | PEN_INJECTOR | SUBCUTANEOUS | 3 refills | Status: DC

## 2019-06-28 NOTE — Assessment & Plan Note (Addendum)
Chronic, ongoing with poor adherence to visits which we are working on.  A1C 8.3% previous visit and today 8.8%.  Continue Metformin 1000 M BID and Jardiance 25 MG daily.  Will try Ozempic 0.25 MG x 4 weeks, first injection provided in office, if tolerates will try increasing to 0.5 MG weekly.  Patient provided a card for Alphonzo Severance PharmD with CCM team, as she has tried to reach patient without success and patient would benefit from CCM services.  Continue diet changes at home.   Urine ALB 10 and A:C <30 on recent check.  Highly recommend she monitor BS daily at home and document. Return in 4 weeks.

## 2019-06-28 NOTE — Progress Notes (Signed)
BP 102/70   Pulse 97   Temp 98.4 F (36.9 C) (Oral)   Wt 171 lb 12.8 oz (77.9 kg)   LMP  (LMP Unknown) Comment: age 69  SpO2 97%   BMI 28.59 kg/m    Subjective:    Patient ID: CHONDA RENGIFO, female    DOB: 04-May-1950, 69 y.o.   MRN: [id number]  HPI: KEON WALTERMIRE is a 69 y.o. female  Chief Complaint  Patient presents with  . Diabetes    will call [facility] for report   . Hyperlipidemia  . Hypertension   DIABETES Last A1C in February 8.3%, Jardiance increase to 25 MG daily.  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  Continues on Metformin 1000 MG BID with her Jardiance.   She has gone on a diet and not eating any bread.  Started in December. Previously has been on Trulicity, which she reports caused mild GI issues. Glipizide has been taken in past.  New glucometer obtained last visit, did check a few times initially, but since then has been at beach. Hypoglycemic episodes:no Polydipsia/polyuria:no Visual disturbance:no Chest pain:no Paresthesias:no Glucose Monitoring:no Accucheck frequency: Not Checking Fasting glucose: Post prandial: Evening: Before meals: Taking Insulin?:no Long acting insulin: Short acting insulin: Blood Pressure Monitoring:not checking Retinal Examination:Up to Date -- had performed at [facility] in February Foot Exam:Up to Date Pneumovax:Up to Date Influenza:Up to Date Aspirin:yes  HYPERTENSION / HYPERLIPIDEMIA Currently taking Lotensin 20-25 and Atorvastatin 10 MG.  She did eat this morning, a little cereal.  Recent LDL 108 and TCHOL 201, AST/ALT 21/24. Satisfied with current treatment?yes Duration of hypertension:chronic BP monitoring frequency:not checking BP range:  BP medication side effects:no Duration of  hyperlipidemia:chronic Cholesterol medication side effects:no Cholesterol supplements: none Medication compliance:good compliance Aspirin:yes Recent stressors:no Recurrent headaches:no Visual changes:no Palpitations:no Dyspnea:no Chest pain:no Lower extremity edema:no Dizzy/lightheaded:no The 10-year ASCVD risk score [name] Jr., et al., 2013) is: 14.6%   Values used to calculate the score:     Age: 76 years     Sex: Female     Is Non-Hispanic African American: No     Diabetic: Yes     Tobacco smoker: No     Systolic Blood Pressure: [id number] mmHg     Is BP treated: Yes     HDL Cholesterol: 42 mg/dL     Total Cholesterol: 201 mg/dL   Relevant past medical, surgical, family and social history reviewed and updated as indicated. Interim medical history since our last visit reviewed. Allergies and medications reviewed and updated.  Review of Systems  Constitutional: Negative for activity change, appetite change, diaphoresis, fatigue and fever.  Respiratory: Negative for cough, chest tightness and shortness of breath.   Cardiovascular: Negative for chest pain, palpitations and leg swelling.  Gastrointestinal: Negative.   Endocrine: Negative for cold intolerance, heat intolerance, polydipsia, polyphagia and polyuria.  Neurological: Negative.   Psychiatric/Behavioral: Negative.    Per HPI unless specifically indicated above     Objective:    BP 102/70   Pulse 97   Temp 98.4 F (36.9 C) (Oral)   Wt 171 lb 12.8 oz (77.9 kg)   LMP  (LMP Unknown) Comment: age 55  SpO2 97%   BMI 28.59 kg/m   Wt Readings from Last 3 Encounters:  06/28/19 171 lb 12.8 oz (77.9 kg)  11/07/17 170 lb 11.2 oz (  77.4 kg)  10/17/17 175 lb 4.8 oz (79.5 kg)    Physical Exam Vitals and nursing note reviewed.  Constitutional:      General: She is awake. She is not in acute distress.    Appearance: She is well-developed and overweight. She is not ill-appearing.  HENT:     Head:  Normocephalic.     Right Ear: Hearing normal.     Left Ear: Hearing normal.  Eyes:     General: Lids are normal.        Right eye: No discharge.        Left eye: No discharge.     Conjunctiva/sclera: Conjunctivae normal.     Pupils: Pupils are equal, round, and reactive to light.  Neck:     Thyroid: No thyromegaly.     Vascular: No carotid bruit or JVD.  Cardiovascular:     Rate and Rhythm: Normal rate and regular rhythm.     Heart sounds: Normal heart sounds. No murmur. No gallop.   Pulmonary:     Effort: Pulmonary effort is normal. No accessory muscle usage or respiratory distress.     Breath sounds: Normal breath sounds.  Abdominal:     General: Bowel sounds are normal.     Palpations: Abdomen is soft.  Musculoskeletal:     Cervical back: Normal range of motion and neck supple.     Right lower leg: No edema.     Left lower leg: No edema.  Lymphadenopathy:     Cervical: No cervical adenopathy.  Skin:    General: Skin is warm and dry.  Neurological:     Mental Status: She is alert and oriented to person, place, and time.     Cranial Nerves: Cranial nerves are intact.     Motor: Motor function is intact.     Coordination: Coordination is intact.     Gait: Gait is intact.     Deep Tendon Reflexes: Reflexes are normal and symmetric.     Reflex Scores:      Brachioradialis reflexes are 2+ on the right side and 2+ on the left side.      Patellar reflexes are 2+ on the right side and 2+ on the left side. Psychiatric:        Attention and Perception: Attention normal.        Mood and Affect: Mood normal.        Behavior: Behavior normal. Behavior is cooperative.        Thought Content: Thought content normal.        Judgment: Judgment normal.     Results for orders placed or performed in visit on 03/29/19  Bayer DCA Hb A1c Waived  Result Value Ref Range   HB A1C (BAYER DCA - WAIVED) 8.3 (H) <7.0 %  Microalbumin, Urine Waived  Result Value Ref Range   Microalb, Ur  Waived 10 0 - 19 mg/L   Creatinine, Urine Waived 50 10 - 300 mg/dL   Microalb/Creat Ratio <30 <30 mg/g  Comprehensive metabolic panel  Result Value Ref Range   Glucose 154 (H) 65 - 99 mg/dL   BUN 17 8 - 27 mg/dL   Creatinine, Ser 0.76 0.57 - 1.00 mg/dL   GFR calc non Af Amer 81 >59 mL/min/1.73   GFR calc Af Amer 93 >59 mL/min/1.73   BUN/Creatinine Ratio 22 12 - 28   Sodium 138 134 - 144 mmol/L   Potassium 3.9 3.5 - 5.2 mmol/L   Chloride 97 96 -  106 mmol/L   CO2 24 20 - 29 mmol/L   Calcium 10.4 (H) 8.7 - 10.3 mg/dL   Total Protein 7.4 6.0 - 8.5 g/dL   Albumin 4.7 3.8 - 4.8 g/dL   Globulin, Total 2.7 1.5 - 4.5 g/dL   Albumin/Globulin Ratio 1.7 1.2 - 2.2   Bilirubin Total 0.4 0.0 - 1.2 mg/dL   Alkaline Phosphatase 50 39 - 117 IU/L   AST 21 0 - 40 IU/L   ALT 24 0 - 32 IU/L  Lipid Panel w/o Chol/HDL Ratio  Result Value Ref Range   Cholesterol, Total 201 (H) 100 - 199 mg/dL   Triglycerides 296 (H) 0 - 149 mg/dL   HDL 42 >39 mg/dL   VLDL Cholesterol Cal 51 (H) 5 - 40 mg/dL   LDL Chol Calc (NIH) 108 (H) 0 - 99 mg/dL  Vitamin B12  Result Value Ref Range   Vitamin B-12 367 232 - 1,245 pg/mL      Assessment & Plan:   Problem List Items Addressed This Visit      Cardiovascular and Mediastinum   Hypertension associated with diabetes ([place])    Chronic, stable with BP below goal in office today.  Continue current medication regimen and adjust as needed.  Recommend checking BP at home three mornings a week and documenting for provider review.  BMP today.  Return in 3 months.      Relevant Medications   Semaglutide,0.25 or 0.5MG /DOS, (OZEMPIC, 0.25 OR 0.5 MG/DOSE,) 2 MG/1.5ML SOPN   Other Relevant Orders   Basic metabolic panel   Bayer DCA Hb A1c Waived     Endocrine   Hyperlipidemia associated with type 2 diabetes mellitus (HCC)    Chronic, ongoing.  Continue current medication regimen and adjust as needed.  Lipid panel today and may increase dose if elevations above goal,  discussed with her.      Relevant Medications   Semaglutide,0.25 or 0.5MG /DOS, (OZEMPIC, 0.25 OR 0.5 MG/DOSE,) 2 MG/1.5ML SOPN   Other Relevant Orders   Bayer DCA Hb A1c Waived   Lipid Panel w/o Chol/HDL Ratio   Poorly controlled type 2 diabetes mellitus ([place]) - Primary    Chronic, ongoing with poor adherence to visits which we are working on.  A1C 8.3% previous visit and today 8.8%.  Continue Metformin 1000 M BID and Jardiance 25 MG daily.  Will try Ozempic 0.25 MG x 4 weeks, first injection provided in office, if tolerates will try increasing to 0.5 MG weekly.  Patient provided a card for Alphonzo Severance PharmD with CCM team, as she has tried to reach patient without success and patient would benefit from CCM services.  Continue diet changes at home.   Urine ALB 10 and A:C <30 on recent check.  Highly recommend she monitor BS daily at home and document. Return in 4 weeks.      Relevant Medications   Semaglutide,0.25 or 0.5MG /DOS, (OZEMPIC, 0.25 OR 0.5 MG/DOSE,) 2 MG/1.5ML SOPN   Other Relevant Orders   Bayer DCA Hb A1c Waived       Follow up plan: Return in about 4 weeks (around 07/26/2019) for T2DM.

## 2019-06-28 NOTE — Telephone Encounter (Signed)
Received fax to start PA for Ozempic. PA initiated and got a response saying that PA was not needed as medication is on current drug plan.   Called to notify pharmacy of this. Pharmacist states that it is because the patient's insurance only covers a certain amount per month, 1 box. They state that they will go ahead and get RX ready based on insurance preference.

## 2019-06-28 NOTE — Assessment & Plan Note (Signed)
Chronic, stable with BP below goal in office today.  Continue current medication regimen and adjust as needed.  Recommend checking BP at home three mornings a week and documenting for provider review.  BMP today.  Return in 3 months.

## 2019-06-28 NOTE — Assessment & Plan Note (Addendum)
Chronic, ongoing.  Continue current medication regimen and adjust as needed.  Lipid panel today and may increase dose if elevations above goal, discussed with her. 

## 2019-06-28 NOTE — Patient Instructions (Signed)

## 2019-06-29 LAB — BASIC METABOLIC PANEL
BUN/Creatinine Ratio: 20 (ref 12–28)
BUN: 14 mg/dL (ref 8–27)
CO2: 24 mmol/L (ref 20–29)
Calcium: 10.3 mg/dL (ref 8.7–10.3)
Chloride: 97 mmol/L (ref 96–106)
Creatinine, Ser: 0.7 mg/dL (ref 0.57–1.00)
GFR calc Af Amer: 102 mL/min/{1.73_m2} (ref >59)
GFR calc non Af Amer: 89 mL/min/{1.73_m2} (ref >59)
Glucose: 190 mg/dL — ABNORMAL HIGH (ref 65–99)
Potassium: 4.3 mmol/L (ref 3.5–5.2)
Sodium: 137 mmol/L (ref 134–144)

## 2019-06-29 LAB — LIPID PANEL W/O CHOL/HDL RATIO
Cholesterol, Total: 198 mg/dL (ref 100–199)
HDL: 43 mg/dL (ref >39)
LDL Chol Calc (NIH): 102 mg/dL — ABNORMAL HIGH (ref 0–99)
Triglycerides: 313 mg/dL — ABNORMAL HIGH (ref 0–149)
VLDL Cholesterol Cal: 53 mg/dL — ABNORMAL HIGH (ref 5–40)

## 2019-06-29 NOTE — Progress Notes (Signed)
Please let Keasia know her labs have returned.  Kidney function is normal and electrolytes look good with exception of elevation in sugar.  Cholesterol levels are elevated, had you ate before labs?  Are you taking Atorvastatin every day? If you are taking every day it would be good to increase dose from 10 MG daily yo 20 MG daily to get better control and prevent stroke.  Is this increase okay with you?  Please let me know.  If it is I will send in and then would like to see you for lab visit only in 6 weeks, please schedule.  Have a great day!!

## 2019-07-26 ENCOUNTER — Ambulatory Visit (INDEPENDENT_AMBULATORY_CARE_PROVIDER_SITE_OTHER): Payer: Medicare Other | Admitting: Nurse Practitioner

## 2019-07-26 ENCOUNTER — Encounter: Payer: Self-pay | Admitting: Nurse Practitioner

## 2019-07-26 ENCOUNTER — Other Ambulatory Visit: Payer: Self-pay

## 2019-07-26 VITALS — BP 108/82 | HR 84 | Temp 98.2°F | Ht 65.0 in | Wt 166.0 lb

## 2019-07-26 DIAGNOSIS — R42 Dizziness and giddiness: Secondary | ICD-10-CM

## 2019-07-26 DIAGNOSIS — E1165 Type 2 diabetes mellitus with hyperglycemia: Secondary | ICD-10-CM

## 2019-07-26 MED ORDER — RYBELSUS 3 MG PO TABS: 3.0000 mg | ORAL_TABLET | Freq: Every day | ORAL | 1 refills | Status: DC

## 2019-07-26 NOTE — Assessment & Plan Note (Signed)
Chronic, ongoing with poor adherence to visits which we are working on.  A1C recently 8.8% was started on Ozempic but poorly tolerating, had issues with Trulicity in past too.  Continue Metformin 1000 M BID and Jardiance 25 MG daily.  Will switch to oral Rybelsus and see if this causes ADR and if tolerated better, script sent.  Patient provided a card for Cheryl Mendez PharmD with CCM team, as she has tried to reach patient without success and patient would benefit from CCM services.  Continue diet changes at home.  Highly recommend she monitor BS daily at home and document. Return in 8 weeks, per her request as mother under hospice and can not come sooner.

## 2019-07-26 NOTE — Progress Notes (Signed)
BP 108/82 (BP Location: Left Arm)   Pulse 84 Comment: apical  Temp 98.2 F (36.8 C) (Oral)   Ht 5\' 5"  (1.651 m)   Wt 166 lb (75.3 kg)   LMP  (LMP Unknown) Comment: age 69  SpO2 95%   BMI 27.62 kg/m    Subjective:    Patient ID: Cheryl Mendez, female    DOB: [birth year], 69 y.o.   MRN: [id number]  HPI: Cheryl Mendez is a 69 y.o. female  Chief Complaint  Patient presents with  . Diabetes    Patient states some stomach pain and nausea with Ozempic injections   DIABETES Last A1C in May 8.8%, Ozempic added.  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 Continues on Metformin 1000 MG BID and Jardiance 25 MG daily. Previously has been on Trulicity, which she reports caused mild GI issues.  Has not been checking sugars at home.  With Ozempic she started having nausea, abdominal pain, + bad taste in mouth.  Does not notice in initial 3 days, but notices it after this.  States noting some dizziness too, which was there prior with orthostatic BP being noted, is improving at this time. Hypoglycemic episodes:no Polydipsia/polyuria:no Visual disturbance:no Chest pain:no Paresthesias:no Glucose Monitoring:no Accucheck frequency: Not Checking Fasting glucose: Post prandial: Evening: Before meals: Taking Insulin?:no Long acting insulin: Short acting insulin: Blood Pressure Monitoring:not checking Retinal Examination:Up to Date -- had performed at [facility] in February Foot Exam:Up to Date Pneumovax:Up to Date Influenza:Up to Date Aspirin:yes  Relevant past medical, surgical, family and social history reviewed and updated as indicated. Interim medical history since our last visit reviewed. Allergies and medications reviewed and updated.  Review of Systems  Constitutional: Negative for activity change, appetite change,  diaphoresis, fatigue and fever.  Respiratory: Negative for cough, chest tightness and shortness of breath.   Cardiovascular: Negative for chest pain, palpitations and leg swelling.  Gastrointestinal: Negative.   Endocrine: Negative for cold intolerance, heat intolerance, polydipsia, polyphagia and polyuria.  Neurological: Negative.   Psychiatric/Behavioral: Negative.     Per HPI unless specifically indicated above     Objective:    BP 108/82 (BP Location: Left Arm)   Pulse 84 Comment: apical  Temp 98.2 F (36.8 C) (Oral)   Ht 5\' 5"  (1.651 m)   Wt 166 lb (75.3 kg)   LMP  (LMP Unknown) Comment: age 50  SpO2 95%   BMI 27.62 kg/m   Wt Readings from Last 3 Encounters:  07/26/19 166 lb (75.3 kg)  06/28/19 171 lb 12.8 oz (77.9 kg)  11/07/17 170 lb 11.2 oz (77.4 kg)    Physical Exam Vitals and nursing note reviewed.  Constitutional:      General: She is awake. She is not in acute distress.    Appearance: She is well-developed and overweight. She is not ill-appearing.  HENT:     Head: Normocephalic.     Right Ear: Hearing normal.     Left Ear: Hearing normal.  Eyes:     General: Lids are normal.        Right eye: No discharge.        Left eye: No discharge.     Conjunctiva/sclera: Conjunctivae normal.     Pupils: Pupils are equal, round, and reactive to light.  Neck:     Thyroid: No thyromegaly.     Vascular: No carotid bruit or JVD.  Cardiovascular:  Rate and Rhythm: Normal rate and regular rhythm.     Heart sounds: Normal heart sounds. No murmur. No gallop.   Pulmonary:     Effort: Pulmonary effort is normal. No accessory muscle usage or respiratory distress.     Breath sounds: Normal breath sounds.  Abdominal:     General: Bowel sounds are normal.     Palpations: Abdomen is soft.  Musculoskeletal:     Cervical back: Normal range of motion and neck supple.     Right lower leg: No edema.     Left lower leg: No edema.  Lymphadenopathy:     Cervical: No cervical  adenopathy.  Skin:    General: Skin is warm and dry.  Neurological:     Mental Status: She is alert and oriented to person, place, and time.     Cranial Nerves: Cranial nerves are intact.     Motor: Motor function is intact.     Coordination: Coordination is intact.     Gait: Gait is intact.     Deep Tendon Reflexes: Reflexes are normal and symmetric.     Reflex Scores:      Brachioradialis reflexes are 2+ on the right side and 2+ on the left side.      Patellar reflexes are 2+ on the right side and 2+ on the left side. Psychiatric:        Attention and Perception: Attention normal.        Mood and Affect: Mood normal.        Behavior: Behavior normal. Behavior is cooperative.        Thought Content: Thought content normal.        Judgment: Judgment normal.     Results for orders placed or performed in visit on 21/19/41  Basic metabolic panel  Result Value Ref Range   Glucose 190 (H) 65 - 99 mg/dL   BUN 14 8 - 27 mg/dL   Creatinine, Ser 0.70 0.57 - 1.00 mg/dL   GFR calc non Af Amer 89 >59 mL/min/1.73   GFR calc Af Amer 102 >59 mL/min/1.73   BUN/Creatinine Ratio 20 12 - 28   Sodium 137 134 - 144 mmol/L   Potassium 4.3 3.5 - 5.2 mmol/L   Chloride 97 96 - 106 mmol/L   CO2 24 20 - 29 mmol/L   Calcium 10.3 8.7 - 10.3 mg/dL  Bayer DCA Hb A1c Waived  Result Value Ref Range   HB A1C (BAYER DCA - WAIVED) 8.8 (H) <7.0 %  Lipid Panel w/o Chol/HDL Ratio  Result Value Ref Range   Cholesterol, Total 198 100 - 199 mg/dL   Triglycerides 313 (H) 0 - 149 mg/dL   HDL 43 >39 mg/dL   VLDL Cholesterol Cal 53 (H) 5 - 40 mg/dL   LDL Chol Calc (NIH) 102 (H) 0 - 99 mg/dL      Assessment & Plan:   Problem List Items Addressed This Visit      Endocrine   Poorly controlled type 2 diabetes mellitus ([place])    Chronic, ongoing with poor adherence to visits which we are working on.  A1C recently 8.8% was started on Ozempic but poorly tolerating, had issues with Trulicity in past too.  Continue  Metformin 1000 M BID and Jardiance 25 MG daily.  Will switch to oral Rybelsus and see if this causes ADR and if tolerated better, script sent.  Patient provided a card for Alphonzo Severance PharmD with CCM team, as she has tried to  reach patient without success and patient would benefit from CCM services.  Continue diet changes at home.  Highly recommend she monitor BS daily at home and document. Return in 8 weeks, per her request as mother under hospice and can not come sooner.      Relevant Medications   Semaglutide (RYBELSUS) 3 MG TABS     Other   Dizziness    Improving at this time.  Suspect related to orthostatic changes with position changes in morning.  Recommend getting up slowly in morning and sitting at side of bed for a few minutes before standing.  Increase hydration at home.  Monitor BP daily and document, if lower levels at home may consider adjusting BP medications.          Follow up plan: Return in about 8 weeks (around 09/20/2019) for T2DM, HTN/HLD.

## 2019-07-26 NOTE — Patient Instructions (Signed)

## 2019-07-26 NOTE — Assessment & Plan Note (Signed)
Improving at this time.  Suspect related to orthostatic changes with position changes in morning.  Recommend getting up slowly in morning and sitting at side of bed for a few minutes before standing.  Increase hydration at home.  Monitor BP daily and document, if lower levels at home may consider adjusting BP medications.

## 2019-08-09 ENCOUNTER — Other Ambulatory Visit: Payer: Self-pay | Admitting: Nurse Practitioner

## 2019-08-09 NOTE — Telephone Encounter (Signed)
Requested Prescriptions  Pending Prescriptions Disp Refills  . loratadine (CLARITIN) 10 MG tablet [Pharmacy Med Name: LORATADINE 10MG  TABLETS] 90 tablet 3    Sig: TAKE 1 TABLET(10 MG) BY MOUTH DAILY     Ear, Nose, and Throat:  Antihistamines Passed - 08/09/2019 10:45 AM      Passed - Valid encounter within last 12 months    Recent Outpatient Visits          2 weeks ago Poorly controlled type 2 diabetes mellitus ([place])   [place], Jolene T, NP   1 month ago Poorly controlled type 2 diabetes mellitus ([place])   [place] Cannady, Jolene T, NP   4 months ago Poorly controlled type 2 diabetes mellitus ([place])   [place], Jolene T, NP   1 year ago Poorly controlled type 2 diabetes mellitus ([place])   [place] Cannady, Henrine Screws T, NP   1 year ago Need for influenza vaccination   [place] [facility] Kathrine Haddock, NP      Future Appointments            In 1 month Cannady, Barbaraann Faster, NP [employer]

## 2019-09-28 ENCOUNTER — Ambulatory Visit: Payer: Medicare Other | Admitting: Nurse Practitioner

## 2019-11-25 ENCOUNTER — Telehealth: Payer: Self-pay

## 2019-11-25 ENCOUNTER — Ambulatory Visit (INDEPENDENT_AMBULATORY_CARE_PROVIDER_SITE_OTHER): Payer: Medicare Other | Admitting: Unknown Physician Specialty

## 2019-11-25 ENCOUNTER — Encounter: Payer: Self-pay | Admitting: Unknown Physician Specialty

## 2019-11-25 ENCOUNTER — Other Ambulatory Visit: Payer: Self-pay

## 2019-11-25 VITALS — BP 113/75 | HR 97 | Temp 98.5°F | Ht 65.5 in | Wt 171.8 lb

## 2019-11-25 DIAGNOSIS — E1165 Type 2 diabetes mellitus with hyperglycemia: Secondary | ICD-10-CM | POA: Diagnosis not present

## 2019-11-25 DIAGNOSIS — E1169 Type 2 diabetes mellitus with other specified complication: Secondary | ICD-10-CM | POA: Diagnosis not present

## 2019-11-25 DIAGNOSIS — I152 Hypertension secondary to endocrine disorders: Secondary | ICD-10-CM

## 2019-11-25 DIAGNOSIS — E1159 Type 2 diabetes mellitus with other circulatory complications: Secondary | ICD-10-CM | POA: Diagnosis not present

## 2019-11-25 DIAGNOSIS — Z23 Encounter for immunization: Secondary | ICD-10-CM | POA: Diagnosis not present

## 2019-11-25 DIAGNOSIS — E785 Hyperlipidemia, unspecified: Secondary | ICD-10-CM

## 2019-11-25 LAB — BAYER DCA HB A1C WAIVED: HB A1C (BAYER DCA - WAIVED): 8.4 % — ABNORMAL HIGH (ref <7.0)

## 2019-11-25 NOTE — Progress Notes (Signed)
BP 113/75 (BP Location: Left Arm, Patient Position: Sitting, Cuff Size: Normal)   Pulse 97   Temp 98.5 F (36.9 C) (Oral)   Ht 5' 5.5" (1.664 m)   Wt 171 lb 12.8 oz (77.9 kg)   LMP  (LMP Unknown) Comment: age 69  SpO2 97%   BMI 28.15 kg/m    Subjective:    Patient ID: Cheryl Mendez, female    DOB: [birth year], 69 y.o.   MRN: [id number]  HPI: Cheryl Mendez is a 69 y.o. female  Chief Complaint  Patient presents with  . Diabetes   Diabetes: Pt taking all her medications.  Did not start Rybelsus. She is losing weight.  Vertigo comes and goes.    No hypoglycemic episodes No hyperglycemic episodes Feet problems: none Blood Sugars averaging: Not checking eye exam within last year : Had it last year.   Last Hgb A1C: 8.8  Has not followed up with Pharmacists here in the clinic  Hypertension  Using medications without difficulty Average home BPs Not checking.     Using medication without problems or lightheadedness No chest pain with exertion or shortness of breath No Edema  Elevated Cholesterol Using medications without problems No Muscle aches  Diet: "Eating here and there" Exercise: Not exercising  Wants a letter to excuse from [place] duty  Relevant past medical, surgical, family and social history reviewed and updated as indicated. Interim medical history since our last visit reviewed. Allergies and medications reviewed and updated.  Review of Systems  Per HPI unless specifically indicated above     Objective:    BP 113/75 (BP Location: Left Arm, Patient Position: Sitting, Cuff Size: Normal)   Pulse 97   Temp 98.5 F (36.9 C) (Oral)   Ht 5' 5.5" (1.664 m)   Wt 171 lb 12.8 oz (77.9 kg)   LMP  (LMP Unknown) Comment: age 45  SpO2 97%   BMI 28.15 kg/m   Wt Readings from Last 3 Encounters:  11/25/19 171 lb 12.8 oz (77.9 kg)  07/26/19 166 lb (75.3 kg)  06/28/19 171 lb 12.8 oz (77.9 kg)    Physical Exam Constitutional:      General: She is not in acute  distress.    Appearance: Normal appearance. She is well-developed.  HENT:     Head: Normocephalic and atraumatic.  Eyes:     General: Lids are normal. No scleral icterus.       Right eye: No discharge.        Left eye: No discharge.     Conjunctiva/sclera: Conjunctivae normal.  Neck:     Vascular: No carotid bruit or JVD.  Cardiovascular:     Rate and Rhythm: Normal rate and regular rhythm.     Heart sounds: Normal heart sounds.  Pulmonary:     Effort: Pulmonary effort is normal.     Breath sounds: Normal breath sounds.  Abdominal:     Palpations: There is no hepatomegaly or splenomegaly.  Musculoskeletal:        General: Normal range of motion.     Cervical back: Normal range of motion and neck supple.  Skin:    General: Skin is warm and dry.     Coloration: Skin is not pale.     Findings: No rash.  Neurological:     Mental Status: She is alert and oriented to person, place, and time.  Psychiatric:        Behavior: Behavior normal.  Thought Content: Thought content normal.        Judgment: Judgment normal.     Results for orders placed or performed in visit on 84/16/60  Basic metabolic panel  Result Value Ref Range   Glucose 190 (H) 65 - 99 mg/dL   BUN 14 8 - 27 mg/dL   Creatinine, Ser 0.70 0.57 - 1.00 mg/dL   GFR calc non Af Amer 89 >59 mL/min/1.73   GFR calc Af Amer 102 >59 mL/min/1.73   BUN/Creatinine Ratio 20 12 - 28   Sodium 137 134 - 144 mmol/L   Potassium 4.3 3.5 - 5.2 mmol/L   Chloride 97 96 - 106 mmol/L   CO2 24 20 - 29 mmol/L   Calcium 10.3 8.7 - 10.3 mg/dL  Bayer DCA Hb A1c Waived  Result Value Ref Range   HB A1C (BAYER DCA - WAIVED) 8.8 (H) <7.0 %  Lipid Panel w/o Chol/HDL Ratio  Result Value Ref Range   Cholesterol, Total 198 100 - 199 mg/dL   Triglycerides 313 (H) 0 - 149 mg/dL   HDL 43 >39 mg/dL   VLDL Cholesterol Cal 53 (H) 5 - 40 mg/dL   LDL Chol Calc (NIH) 102 (H) 0 - 99 mg/dL      Assessment & Plan:   Problem List Items  Addressed This Visit      Unprioritized   Hyperlipidemia associated with type 2 diabetes mellitus ([place])    Stable, continue present medications.       Hypertension associated with diabetes ([place])    Stable, continue present medications.        Relevant Orders   Comprehensive metabolic panel   Poorly controlled type 2 diabetes mellitus ([place]) - Primary    Hgb A1C is improved from 8.7% to 8.4%  Lost weight.  Refer back to [facility] pharmacy.  Will continue with present medications plus start Rybelsus.  Will recheck in 3 months if able to continue lifestyle changes.        Relevant Orders   Bayer [place] Hb A1c Waived   Referral to Chronic Care Management Services    Other Visit Diagnoses    Need for influenza vaccination       Relevant Orders   Flu Vaccine QUAD High Dose(Fluad) (Completed)      Note written for [employer].    Follow up plan: Return in about 3 months (around 02/25/2020).

## 2019-11-25 NOTE — Chronic Care Management (AMB) (Signed)
Care Management   Note  11/25/2019 Name: Cheryl Mendez MRN: [id number] DOB: August 22, 1950  Cheryl Mendez is a 69 y.o. year old female who is a primary care patient of Marjie Skiff, NP and is actively engaged with the care management team. I reached out to Dante Gang by phone today to assist with scheduling an initial visit with the Pharmacist  Follow up plan: Unsuccessful telephone outreach attempt made. A HIPAA compliant phone message was left for the patient providing contact information and requesting a return call.  The care management team will reach out to the patient again over the next 7 days.  If patient returns call to provider office, please advise to call Embedded Care Management Care Guide Penne Lash  at [phone]  Penne Lash, RMA Care Guide, Embedded Care Coordination [facility]  [place], [place] [address] Direct Dial: [phone] [email or web address]: [email or web address]

## 2019-11-25 NOTE — Assessment & Plan Note (Signed)
Stable, continue present medications.   

## 2019-11-25 NOTE — Assessment & Plan Note (Addendum)
Hgb A1C is improved from 8.7% to 8.4%  Lost weight.  Refer back to [facility] pharmacy.  Will continue with present medications plus start Rybelsus.  Will recheck in 3 months if able to continue lifestyle changes.

## 2019-11-26 LAB — COMPREHENSIVE METABOLIC PANEL
ALT: 33 IU/L — ABNORMAL HIGH (ref 0–32)
AST: 28 IU/L (ref 0–40)
Albumin/Globulin Ratio: 1.8 (ref 1.2–2.2)
Albumin: 4.7 g/dL (ref 3.8–4.8)
Alkaline Phosphatase: 49 IU/L (ref 44–121)
BUN/Creatinine Ratio: 29 — ABNORMAL HIGH (ref 12–28)
BUN: 18 mg/dL (ref 8–27)
Bilirubin Total: 0.5 mg/dL (ref 0.0–1.2)
CO2: 25 mmol/L (ref 20–29)
Calcium: 9.9 mg/dL (ref 8.7–10.3)
Chloride: 94 mmol/L — ABNORMAL LOW (ref 96–106)
Creatinine, Ser: 0.62 mg/dL (ref 0.57–1.00)
GFR calc Af Amer: 106 mL/min/{1.73_m2} (ref >59)
GFR calc non Af Amer: 92 mL/min/{1.73_m2} (ref >59)
Globulin, Total: 2.6 g/dL (ref 1.5–4.5)
Glucose: 161 mg/dL — ABNORMAL HIGH (ref 65–99)
Potassium: 4.3 mmol/L (ref 3.5–5.2)
Sodium: 136 mmol/L (ref 134–144)
Total Protein: 7.3 g/dL (ref 6.0–8.5)

## 2019-11-30 NOTE — Chronic Care Management (AMB) (Signed)
Chronic Care Management   Note  11/30/2019 Name: Cheryl Mendez MRN: [id number] DOB: 1951-02-12  Cheryl Mendez is a 69 y.o. year old female who is a primary care patient of Cannady, Dorie Rank, NP. Cheryl Mendez is currently enrolled in care management services. An additional referral for Pharm D  was placed.   Follow up plan: Telephone appointment with care management team member scheduled for:12/17/2019  Penne Lash, RMA Care Guide, Embedded Care Coordination [facility]  [place], [place] [address] Direct Dial: [phone] [email or web address]: [email or web address]

## 2019-12-17 ENCOUNTER — Ambulatory Visit: Payer: Medicare Other

## 2019-12-17 NOTE — Progress Notes (Deleted)
Chronic Care Management Pharmacy  Name: Cheryl Mendez  MRN: [id number] DOB: [birth year]   Chief Complaint/ HPI  Cheryl Mendez,  69 y.o. , female presents for their Initial CCM visit with the clinical pharmacist In office.  PCP : Venita Lick, NP Patient Care Team: Venita Lick, NP as PCP - General (Nurse Practitioner) Vladimir Faster, [facility] (Pharmacist)  Their chronic conditions include: Hypertension, Hyperlipidemia, Diabetes, GERD and Allergic Rhinitis   Office Visits: ***  Consult Visit: ***  Allergies  Allergen Reactions  . Penicillin G Benzathine     Medications: Outpatient Encounter Medications as of 12/17/2019  Medication Sig Note  . aspirin 81 MG chewable tablet Chew 81 mg by mouth daily.   Marland Kitchen aspirin-acetaminophen-caffeine (EXCEDRIN MIGRAINE) 250-250-65 MG tablet Take 1 tablet by mouth every 6 (six) hours as needed for headache. 08/05/2018: 1-2 times per week  . atorvastatin (LIPITOR) 10 MG tablet TAKE 1 TABLET(10 MG) BY MOUTH DAILY   . benazepril-hydrochlorthiazide (LOTENSIN HCT) 20-25 MG tablet Take 1 tablet by mouth daily.   . blood glucose meter kit and supplies KIT Dispense based on patient and insurance preference. Use up to four times daily as directed. (FOR ICD-9 250.00, 250.01).   Marland Kitchen empagliflozin (JARDIANCE) 25 MG TABS tablet Take 25 mg by mouth daily.   Marland Kitchen loratadine (CLARITIN) 10 MG tablet TAKE 1 TABLET(10 MG) BY MOUTH DAILY   . metFORMIN (GLUCOPHAGE) 1000 MG tablet Take 1 tablet (1,000 mg total) by mouth 2 (two) times daily with a meal.   . omeprazole (PRILOSEC) 20 MG capsule Take 1 capsule (20 mg total) by mouth daily. (Patient taking differently: Take 20 mg by mouth daily as needed. )   . ONETOUCH DELICA LANCETS FINE MISC USE UP TO FOUR TIMES DAILY AS DIRECTED   . ONETOUCH ULTRA test strip TEST FOUR TIMES DAILY   . Semaglutide (RYBELSUS) 3 MG TABS Take 3 mg by mouth daily. Take 30 minutes before meal in morning. (Patient not taking: Reported on  11/25/2019)    No facility-administered encounter medications on file as of 12/17/2019.    Wt Readings from Last 3 Encounters:  11/25/19 171 lb 12.8 oz (77.9 kg)  07/26/19 166 lb (75.3 kg)  06/28/19 171 lb 12.8 oz (77.9 kg)    Current Diagnosis/Assessment:    Goals Addressed   None    Diabetes   A1c goal {A1c goals:23924}  Recent Relevant Labs: Lab Results  Component Value Date/Time   HGBA1C 8.4 (H) 11/25/2019 01:25 PM   HGBA1C 8.8 (H) 06/28/2019 01:33 PM   HGBA1C 7.4% 10/17/2015 12:00 AM   MICROALBUR 10 03/29/2019 10:48 AM   MICROALBUR 10 07/28/2018 10:39 AM    Last diabetic Eye exam:  Lab Results  Component Value Date/Time   HMDIABEYEEXA No Retinopathy 11/04/2014 02:07 PM    Last diabetic Foot exam: No results found for: HMDIABFOOTEX   Checking BG: {CHL HP Blood Glucose Monitoring [phone]}  Recent FBG Readings: *** Recent pre-meal BG readings: *** Recent 2hr PP BG readings:  *** Recent HS BG readings: ***  Patient has failed these meds in past: *** Patient is currently {CHL Controlled/[phone]} on the following medications: . ***  We discussed: {CHL HP Upstream Pharmacy discussion:[phone]}  Plan  Continue {CHL HP Upstream Pharmacy Plans:[phone]}  Hypertension   BP goal is:  {CHL HP UPSTREAM Pharmacist BP [phone]}  Office blood pressures are  BP Readings from Last 3 Encounters:  11/25/19 113/75  07/26/19 108/82  06/28/19 102/70  Patient checks BP at home {CHL HP BP Monitoring Frequency:[phone]} Patient home BP readings are ranging: ***  Patient has failed these meds in the past: *** Patient is currently {CHL Controlled/Uncontrolled:[phone]} on the following medications:  . ***  We discussed {CHL HP Upstream Pharmacy discussion:[phone]}  Plan  Continue {CHL HP Upstream Pharmacy [phone]}     GERD   Patient has failed these meds in past: *** Patient is currently {CHL  Controlled/Uncontrolled:[phone]} on the following medications:  . ***  We discussed:  ***  Plan  Continue {CHL HP Upstream Pharmacy DVOUZ:[phone]}      Medication Management   Pt uses *** pharmacy for all medications Uses pill box? {Yes or If no, why not?:20788} Pt endorses ***% compliance  We discussed: {Pharmacy options:24294}  Plan  {US Pharmacy AJLU:72761}    Follow up: *** month phone visit  ***

## 2019-12-30 ENCOUNTER — Telehealth: Payer: Self-pay

## 2019-12-30 NOTE — Chronic Care Management (AMB) (Signed)
Care Management   Note  12/30/2019 Name: PATTE KARPEL MRN: [id number] DOB: 1950/09/19  EILEEN GALVAO is a 69 y.o. year old female who is a primary care patient of Marjie Skiff, NP and is actively engaged with the care management team. I reached out to Dante Gang by phone today to assist with re-scheduling an initial visit with the Pharmacist  Follow up plan: Unsuccessful telephone outreach attempt made. A HIPAA compliant phone message was left for the patient providing contact information and requesting a return call.  The care management team will reach out to the patient again over the next 7 days.  If patient returns call to provider office, please advise to call Embedded Care Management Care Guide Penne Lash  at [phone]  Penne Lash, RMA Care Guide, Embedded Care Coordination [facility]  [place], [place] [address] Direct Dial: [phone] [email or web address] Website: [email or web address]

## 2019-12-31 ENCOUNTER — Encounter: Payer: Self-pay | Admitting: Nurse Practitioner

## 2019-12-31 ENCOUNTER — Ambulatory Visit (INDEPENDENT_AMBULATORY_CARE_PROVIDER_SITE_OTHER): Payer: Medicare Other | Admitting: Nurse Practitioner

## 2019-12-31 ENCOUNTER — Other Ambulatory Visit: Payer: Self-pay

## 2019-12-31 DIAGNOSIS — J3489 Other specified disorders of nose and nasal sinuses: Secondary | ICD-10-CM | POA: Insufficient documentation

## 2019-12-31 MED ORDER — GUAIFENESIN 100 MG/5ML PO LIQD: 200.0000 mg | Freq: Three times a day (TID) | ORAL | 0 refills | Status: DC | PRN

## 2019-12-31 MED ORDER — AZITHROMYCIN 250 MG PO TABS: ORAL_TABLET | ORAL | 0 refills | Status: DC

## 2019-12-31 NOTE — Patient Instructions (Signed)
Sinus Headache  A sinus headache occurs when your sinuses become clogged or swollen. Sinuses are air-filled spaces in your skull that are behind the bones of your face and forehead. Sinus headaches can range from mild to severe. What are the causes? A sinus headache can result from various conditions that affect the sinuses. Common causes include:  Colds.  Sinus infections.  Allergies. Many people confuse sinus headaches with migraines or tension headaches because those headaches can also cause facial pain and nasal symptoms. What are the signs or symptoms? The main symptom of this condition is a headache that may feel like pain or pressure in your face, forehead, ears, or upper teeth. People who have a sinus headache often have other symptoms, such as:  Congested or runny nose.  Fever.  Inability to smell. Weather changes can make symptoms worse. How is this diagnosed? This condition may be diagnosed based on:  A physical exam and medical history.  Imaging tests, such as a CT scan or MRI, to check for problems with the sinuses.  Examination of the sinuses using a thin tool with a camera that is inserted through your nose (endoscopy). How is this treated? Treatment for this condition depends on the cause.  Sinus pain that is caused by a sinus infection may be treated with antibiotic medicine.  Sinus pain that is caused by allergies may be helped by allergy medicines (antihistamines) and medicated nasal sprays.  Sinus pain that is caused by congestion may be helped by rinsing out (flushing) the nose and sinuses with saline solution.  Sinus surgery may be needed in some cases if other treatments do not help. Follow these instructions at home: General instructions  If directed: ? Apply a warm, moist washcloth to your face to help relieve pain. ? Use a nasal saline wash. Medicines   Take over-the-counter and prescription medicines only as told by your health care  provider.  If you were prescribed an antibiotic medicine, take it as told by your health care provider. Do not stop taking the antibiotic even if you start to feel better.  If you have congestion, use a nasal spray to help lessen pressure. Hydrate and humidify  Drink enough water to keep your urine clear or pale yellow. Staying hydrated will help to thin your mucus.  Use a cool mist humidifier to keep the humidity level in your home above 50%.  Inhale steam for 10-15 minutes, 3-4 times a day or as told by your health care provider. You can do this in the bathroom while a hot shower is running.  Limit your exposure to cool or dry air. Contact a health care provider if:  You have a headache more than one time a week.  You have sensitivity to light or sound.  You develop a fever.  You feel nauseous or you vomit.  Your headaches do not get better with treatment. Many people think that they have a sinus headache when they actually have a migraine or a tension headache. Get help right away if:  You have vision problems.  You have sudden, severe pain in your face or head.  You have a seizure.  You are confused.  You have a stiff neck. Summary  A sinus headache occurs when your sinuses become clogged or swollen.  A sinus headache can result from various conditions that affect the sinuses, such as a cold, a sinus infection, or an allergy.  Treatment for this condition depends on the cause. It may include  medicine, such as antibiotics or antihistamines. This information is not intended to replace advice given to you by your health care provider. Make sure you discuss any questions you have with your health care provider. Document Revised: 01/17/2017 Document Reviewed: 11/15/2016 Elsevier Patient Education  2020 [employer].

## 2019-12-31 NOTE — Progress Notes (Signed)
LMP  (LMP Unknown) Comment: age 69   Subjective:    Patient ID: Cheryl Mendez, female    DOB: Dec 07, 1950, 69 y.o.   MRN: [id number]  HPI: Cheryl Mendez is a 69 y.o. female  Chief Complaint  Patient presents with  . URI    pt states she started having a sore throat and not feeling good around 12/12/19. States she is now having a cough, congestion, and sinus pressure. States she is feeling togeter today. Started taking nyquil last night that she states helped.    . This visit was completed via telephone due to the restrictions of the COVID-19 pandemic. All issues as above were discussed and addressed but no physical exam was performed. If it was felt that the patient should be evaluated in the office, they were directed there. The patient verbally consented to this visit. Patient was unable to complete an audio/visual visit due to Technical difficulties,Lack of internet. Due to the catastrophic nature of the COVID-19 pandemic, this visit was done through audio contact only. . Location of the patient: home . Location of the provider: work . Those involved with this call:  . Provider: Marnee Guarneri, DNP . CMA: Yvonna Alanis, CMA . Front Desk/Registration: Jill Side  . Time spent on call: 20 minutes on the phone discussing health concerns. 15 minutes total spent in review of patient's record and preparation of their chart.  . I verified patient identity using two factors (patient name and date of birth). Patient consents verbally to being seen via telemedicine visit today.    UPPER RESPIRATORY TRACT INFECTION Started with symptoms 2 weeks ago, went to a pumpkin batch -- were having a benefit to raise money.  Started with sore throat, cough, congestion, and sinus pressure.  Took Nyquil last night and is feeling better. Reports overall is beginning to feeling better, but still has some sinus issues.  Has not been Covid vaccinated.  No loss of taste or smell. Fever: no Cough:  yes Shortness of breath: no Wheezing: no Chest pain: no Chest tightness: no Chest congestion: no Nasal congestion: yes Runny nose: yes Post nasal drip: yes Sneezing: no Sore throat: off and on Swollen glands: no Sinus pressure: yes Headache: no Face pain: no Toothache: no Ear pain: none Ear pressure: none Eyes red/itching:no Eye drainage/crusting: no  Vomiting: no Rash: no Fatigue: yes Sick contacts: no Strep contacts: no  Context: stable Recurrent sinusitis: no Relief with OTC cold/cough medications: yes  Treatments attempted: cold/sinus   Relevant past medical, surgical, family and social history reviewed and updated as indicated. Interim medical history since our last visit reviewed. Allergies and medications reviewed and updated.  Review of Systems  Constitutional: Positive for fatigue. Negative for activity change, appetite change, diaphoresis and fever.  HENT: Positive for congestion, postnasal drip, rhinorrhea, sinus pressure and sore throat. Negative for ear discharge, ear pain, facial swelling, sinus pain, sneezing and voice change.   Eyes: Negative for pain and visual disturbance.  Respiratory: Positive for cough. Negative for chest tightness, shortness of breath and wheezing.   Cardiovascular: Negative for chest pain, palpitations and leg swelling.  Gastrointestinal: Negative.   Endocrine: Negative.   Musculoskeletal: Negative for myalgias.  Neurological: Negative.   Psychiatric/Behavioral: Negative.     Per HPI unless specifically indicated above     Objective:    LMP  (LMP Unknown) Comment: age 70  Wt Readings from Last 3 Encounters:  11/25/19 171 lb 12.8 oz (77.9 kg)  07/26/19 166  lb (75.3 kg)  06/28/19 171 lb 12.8 oz (77.9 kg)    Physical Exam   Unable to perform due to telephone visit only.  Results for orders placed or performed in visit on 11/25/19  Bayer DCA Hb A1c Waived  Result Value Ref Range   HB A1C (BAYER DCA - WAIVED) 8.4 (H)  <7.0 %  Comprehensive metabolic panel  Result Value Ref Range   Glucose 161 (H) 65 - 99 mg/dL   BUN 18 8 - 27 mg/dL   Creatinine, Ser 0.62 0.57 - 1.00 mg/dL   GFR calc non Af Amer 92 >59 mL/min/1.73   GFR calc Af Amer 106 >59 mL/min/1.73   BUN/Creatinine Ratio 29 (H) 12 - 28   Sodium 136 134 - 144 mmol/L   Potassium 4.3 3.5 - 5.2 mmol/L   Chloride 94 (L) 96 - 106 mmol/L   CO2 25 20 - 29 mmol/L   Calcium 9.9 8.7 - 10.3 mg/dL   Total Protein 7.3 6.0 - 8.5 g/dL   Albumin 4.7 3.8 - 4.8 g/dL   Globulin, Total 2.6 1.5 - 4.5 g/dL   Albumin/Globulin Ratio 1.8 1.2 - 2.2   Bilirubin Total 0.5 0.0 - 1.2 mg/dL   Alkaline Phosphatase 49 44 - 121 IU/L   AST 28 0 - 40 IU/L   ALT 33 (H) 0 - 32 IU/L      Assessment & Plan:   Problem List Items Addressed This Visit      Other   Sinus pressure - Primary    Acute and ongoing for 2 weeks with some improvement.  No Covid vaccinated.  Have highly recommended Covid testing which she refuses to have done.  Recommended she self quarantine at this time until symptoms are 100% improved.  Due to length of illness and risks, will send in abx therapy, Zpack sent in + recommend use of Diabetic Tussin and not Nyquil, sent this in.  Recommend: - Increased rest - Increasing Fluids - Acetaminophen as needed for fever/pain.  - Salt water gargling, chloraseptic spray and throat lozenges - Saline sinus flushes or a neti pot.  - Humidifying the air Return to office for worsening or ongoing symptoms.         I discussed the assessment and treatment plan with the patient. The patient was provided an opportunity to ask questions and all were answered. The patient agreed with the plan and demonstrated an understanding of the instructions.   The patient was advised to call back or seek an in-person evaluation if the symptoms worsen or if the condition fails to improve as anticipated.   I provided 21+ minutes of time during this encounter.  Follow up  plan: Return if symptoms worsen or fail to improve.

## 2019-12-31 NOTE — Assessment & Plan Note (Signed)
Acute and ongoing for 2 weeks with some improvement.  No Covid vaccinated.  Have highly recommended Covid testing which she refuses to have done.  Recommended she self quarantine at this time until symptoms are 100% improved.  Due to length of illness and risks, will send in abx therapy, Zpack sent in + recommend use of Diabetic Tussin and not Nyquil, sent this in.  Recommend: - Increased rest - Increasing Fluids - Acetaminophen as needed for fever/pain.  - Salt water gargling, chloraseptic spray and throat lozenges - Saline sinus flushes or a neti pot.  - Humidifying the air Return to office for worsening or ongoing symptoms.

## 2020-01-05 ENCOUNTER — Other Ambulatory Visit: Payer: Self-pay | Admitting: Nurse Practitioner

## 2020-01-05 NOTE — Telephone Encounter (Signed)
Per Jolene patient needs an appt.

## 2020-01-05 NOTE — Telephone Encounter (Signed)
° °  Notes to clinic:   Pt called for refill of an antibiotic and states she still has cold in her nose and chest and wanted to see if another round can be called in for  Requested Prescriptions  Pending Prescriptions Disp Refills   azithromycin (ZITHROMAX) 250 MG tablet 6 tablet 0    Sig: Take 2 tablets by mouth (500 MG) on day 1 and then 1 tablet (250 MG) on days 2 to 5.      Off-Protocol Failed - 01/05/2020 10:55 AM      Failed - Medication not assigned to a protocol, review manually.      Passed - Valid encounter within last 12 months    Recent Outpatient Visits           5 days ago Sinus pressure   [place], [place] T, NP   1 month ago Poorly controlled type 2 diabetes mellitus ([place])   [place] Kathrine Haddock, NP   5 months ago Poorly controlled type 2 diabetes mellitus ([place])   [place], Jolene T, NP   6 months ago Poorly controlled type 2 diabetes mellitus ([place])   [place] Cannady, Jolene T, NP   9 months ago Poorly controlled type 2 diabetes mellitus ([place])   [place], Barbaraann Faster, NP       Future Appointments             In 1 month Cannady, Barbaraann Faster, NP [employer], [facility]

## 2020-01-05 NOTE — Telephone Encounter (Signed)
Would need visit and highly recommend she obtain Covid testing due to ongoing symptoms.

## 2020-01-05 NOTE — Telephone Encounter (Signed)
Pt has apt on 01/06/2020 the patient verbalized understanding.

## 2020-01-05 NOTE — Telephone Encounter (Signed)
See [facility] note below

## 2020-01-05 NOTE — Telephone Encounter (Signed)
Pt called for refill of an antibiotic and states she still has cold in her nose and chest and wanted to see if another round can be called in for azithromycin (ZITHROMAX) 250 MG tablet [14840] Pt has one pill left / please advise

## 2020-01-06 ENCOUNTER — Encounter: Payer: Self-pay | Admitting: Nurse Practitioner

## 2020-01-06 ENCOUNTER — Telehealth (INDEPENDENT_AMBULATORY_CARE_PROVIDER_SITE_OTHER): Payer: Medicare Other | Admitting: Nurse Practitioner

## 2020-01-06 DIAGNOSIS — R052 Subacute cough: Secondary | ICD-10-CM | POA: Insufficient documentation

## 2020-01-06 DIAGNOSIS — R0981 Nasal congestion: Secondary | ICD-10-CM | POA: Diagnosis not present

## 2020-01-06 DIAGNOSIS — R059 Cough, unspecified: Secondary | ICD-10-CM | POA: Insufficient documentation

## 2020-01-06 MED ORDER — FLUTICASONE PROPIONATE 50 MCG/ACT NA SUSP
2.0000 | Freq: Every day | NASAL | 6 refills | Status: DC
Start: 2020-01-06 — End: 2022-08-21

## 2020-01-06 MED ORDER — PREDNISONE 10 MG (21) PO TBPK
ORAL_TABLET | ORAL | 0 refills | Status: AC
Start: 2020-01-06 — End: 2020-01-12

## 2020-01-06 NOTE — Progress Notes (Signed)
LMP  (LMP Unknown) Comment: age 69   Subjective:    Patient ID: Cheryl Mendez, female    DOB: [birth year], 69 y.o.   MRN: [id number]  HPI: Cheryl Mendez is a 69 y.o. female presenting for ongoing upper respiratory infection.  Chief Complaint  Patient presents with  . Sinus Problem    pt states she has facial pressure, pt states she is coughing up mucus    UPPER RESPIRATORY TRACT INFECTION Was given zpack, took last dose yesterday Onset: 3 weeks Worst symptom: stuffy nose and drainage Fever: no Cough: yes; productive  Shortness of breath: no Wheezing: no Chest pain: no Chest tightness: no Chest congestion: no Nasal congestion: yes Runny nose: yes Post nasal drip: yes Sneezing: yes Sore throat: yes Swollen glands: yes Sinus pressure: yes Headache: no Face pain: no Toothache: no Ear pain: no  Ear pressure: no   Eyes red/itching:no Eye drainage/crusting: yes ; eyes run in morning Nausea: no Vomiting: no Diarrhea: no Change in appetite: no Loss of taste/smell: no Rash: no Fatigue: yes Sick contacts: no Strep contacts: no  Context: better Recurrent sinusitis: no Treatments attempted:  Robitussin, Nyquil cough, Tylenol Relief with OTC medications: some, has gotten worse  Allergies  Allergen Reactions  . Penicillin G Benzathine    Outpatient Encounter Medications as of 01/06/2020  Medication Sig Note  . aspirin 81 MG chewable tablet Chew 81 mg by mouth daily.   Marland Kitchen aspirin-acetaminophen-caffeine (EXCEDRIN MIGRAINE) 250-250-65 MG tablet Take 1 tablet by mouth every 6 (six) hours as needed for headache. 08/05/2018: 1-2 times per week  . atorvastatin (LIPITOR) 10 MG tablet TAKE 1 TABLET(10 MG) BY MOUTH DAILY   . benazepril-hydrochlorthiazide (LOTENSIN HCT) 20-25 MG tablet Take 1 tablet by mouth daily.   . blood glucose meter kit and supplies KIT Dispense based on patient and insurance preference. Use up to four times daily as directed. (FOR ICD-9 250.00, 250.01).     Marland Kitchen empagliflozin (JARDIANCE) 25 MG TABS tablet Take 25 mg by mouth daily.   Marland Kitchen loratadine (CLARITIN) 10 MG tablet TAKE 1 TABLET(10 MG) BY MOUTH DAILY   . metFORMIN (GLUCOPHAGE) 1000 MG tablet Take 1 tablet (1,000 mg total) by mouth 2 (two) times daily with a meal.   . ONETOUCH DELICA LANCETS FINE MISC USE UP TO FOUR TIMES DAILY AS DIRECTED   . ONETOUCH ULTRA test strip TEST FOUR TIMES DAILY   . fluticasone (FLONASE) 50 MCG/ACT nasal spray Place 2 sprays into both nostrils daily.   Marland Kitchen guaiFENesin (DIABETIC TUSSIN EX) 100 MG/5ML liquid Take 10 mLs (200 mg total) by mouth 3 (three) times daily as needed for cough. (Patient not taking: Reported on 01/06/2020)   . omeprazole (PRILOSEC) 20 MG capsule Take 1 capsule (20 mg total) by mouth daily. (Patient not taking: Reported on 01/06/2020)   . predniSONE (STERAPRED UNI-PAK 21 TAB) 10 MG (21) TBPK tablet Take 6 tablets (60 mg total) by mouth daily for 1 day, THEN 5 tablets (50 mg total) daily for 1 day, THEN 4 tablets (40 mg total) daily for 1 day, THEN 3 tablets (30 mg total) daily for 1 day, THEN 2 tablets (20 mg total) daily for 1 day, THEN 1 tablet (10 mg total) daily for 1 day.   . Semaglutide (RYBELSUS) 3 MG TABS Take 3 mg by mouth daily. Take 30 minutes before meal in morning. (Patient not taking: Reported on 11/25/2019)   . [DISCONTINUED] azithromycin (ZITHROMAX) 250 MG tablet Take 2 tablets by mouth (  500 MG) on day 1 and then 1 tablet (250 MG) on days 2 to 5.    No facility-administered encounter medications on file as of 01/06/2020.   Patient Active Problem List   Diagnosis Date Noted  . Nasal congestion 01/06/2020  . Cough 01/06/2020  . Sinus pressure 12/31/2019  . Dizziness 03/29/2019  . GERD (gastroesophageal reflux disease) 07/28/2018  . Seborrheic keratoses 05/07/2017  . Biceps tendonitis on right 07/10/2015  . Allergic rhinitis 11/01/2014  . Hyperlipidemia associated with type 2 diabetes mellitus ([place]) 11/01/2014  . Poorly controlled  type 2 diabetes mellitus ([place]) 11/01/2014  . Arthritis 11/01/2014  . Hypertension associated with diabetes ([place]) 11/01/2014  . Lumbago 11/01/2014  . Elevated liver enzymes 11/01/2014   Past Medical History:  Diagnosis Date  . Allergy   . Arthritis   . Diabetes mellitus without complication ([place])   . GERD (gastroesophageal reflux disease)   . Hyperlipidemia   . Hypertension   . Lumbago   . Migraine    Relevant past medical, surgical, family and social history reviewed and updated as indicated. Interim medical history since our last visit.  Review of Systems  Constitutional: Positive for fatigue. Negative for activity change, appetite change, chills and fever.  HENT: Positive for congestion, postnasal drip, rhinorrhea, sinus pressure, sneezing and sore throat. Negative for dental problem, ear discharge, ear pain, sinus pain and trouble swallowing.   Eyes: Positive for discharge (clear runny eyes in am). Negative for pain, redness, itching and visual disturbance.  Respiratory: Positive for cough. Negative for chest tightness, shortness of breath and wheezing.   Cardiovascular: Negative.  Negative for chest pain.  Gastrointestinal: Negative.  Negative for diarrhea, nausea and vomiting.  Skin: Negative.  Negative for rash.  Neurological: Negative.  Negative for dizziness, weakness, light-headedness and headaches.  Psychiatric/Behavioral: Negative.  Negative for confusion and sleep disturbance. The patient is not nervous/anxious.     Per HPI unless specifically indicated above     Objective:    LMP  (LMP Unknown) Comment: age 80  Wt Readings from Last 3 Encounters:  11/25/19 171 lb 12.8 oz (77.9 kg)  07/26/19 166 lb (75.3 kg)  06/28/19 171 lb 12.8 oz (77.9 kg)    Physical Exam Vitals and nursing note reviewed.  Constitutional:      General: She is not in acute distress.    Appearance: Normal appearance. She is not ill-appearing, toxic-appearing or diaphoretic.  HENT:      Head: Normocephalic and atraumatic.     Right Ear: External ear normal.     Left Ear: External ear normal.     Nose: Congestion present. No rhinorrhea.     Mouth/Throat:     Mouth: Mucous membranes are moist.     Pharynx: Oropharynx is clear.  Eyes:     General: No scleral icterus.       Right eye: No discharge.        Left eye: No discharge.     Extraocular Movements: Extraocular movements intact.  Cardiovascular:     Comments: Unable to assess heart sounds via virtual visit. Pulmonary:     Effort: Pulmonary effort is normal. No respiratory distress.     Comments: Unable to assess lung sounds via virtual visit.  Patient talking in complete sentences without any accessory muscle use. Abdominal:     Comments: Unable to assess bowel sounds via virtual visit  Skin:    Coloration: Skin is not jaundiced or pale.     Findings: No erythema.  Neurological:     General: No focal deficit present.     Mental Status: She is alert and oriented to person, place, and time.     Motor: No weakness.  Psychiatric:        Mood and Affect: Mood normal.        Behavior: Behavior normal.        Thought Content: Thought content normal.        Judgment: Judgment normal.        Assessment & Plan:   Problem List Items Addressed This Visit      Other   Nasal congestion    Acute, ongoing.  Unclear etiology.  Finished course of antibiotics yesterday (azithromycin).  Given nasal congestion with sneezing, sore throat, and clear eye drainage, thinking allergic rhinitis may be cause.  Will treat with flonase and steroid taper.  Discussed length of viral symptoms with cough lasting up to ~6 weeks.  Continue guaifenesin, plenty of hydration, and supportive care. If not better by Monday, consider alternative antibiotic or second course of azithromycin which has worked in past per patient.  If morning productive cough remains persistent, want to have in-office evaluation for cough.      Cough - Primary     Acute, ongoing.  Unclear etiology.  Finished course of antibiotics yesterday (azithromycin).  Given nasal congestion with sneezing, sore throat, and clear eye drainage, thinking allergic rhinitis may be cause.  Will treat with flonase and steroid taper.  Discussed length of viral symptoms with cough lasting up to ~6 weeks.  Continue guaifenesin, plenty of hydration, and supportive care. If not better by Monday, consider alternative antibiotic or second course of azithromycin which has worked in past per patient.  If morning productive cough remains persistent, want to have in-office evaluation for cough.          Follow up plan: Return if symptoms worsen or fail to improve.  Due to the catastrophic nature of the COVID-19 pandemic, this visit was completed via audio and visual contact via [employer] due to the restrictions of the COVID-19 pandemic. All issues as above were discussed and addressed. Physical exam was done as above through visual confirmation on [employer]. If it was felt that the patient should be evaluated in the office, they were directed there. The patient verbally consented to this visit."} . Location of the patient: home . Location of the provider: work . Those involved with this call:  . Provider: Carnella Guadalajara, DNP . CMA: Louanna Raw, CMA . Front Desk/Registration: Jill Side  . Time spent on call: 15 minutes with patient face to face via video conference. More than 50% of this time was spent in counseling and coordination of care. 20 minutes total spent in review of patient's record and preparation of their chart.  I verified patient identity using two factors (patient name and date of birth). Patient consents verbally to being seen via telemedicine visit today.

## 2020-01-06 NOTE — Assessment & Plan Note (Signed)
Acute, ongoing.  Unclear etiology.  Finished course of antibiotics yesterday (azithromycin).  Given nasal congestion with sneezing, sore throat, and clear eye drainage, thinking allergic rhinitis may be cause.  Will treat with flonase and steroid taper.  Discussed length of viral symptoms with cough lasting up to ~6 weeks.  Continue guaifenesin, plenty of hydration, and supportive care. If not better by Monday, consider alternative antibiotic or second course of azithromycin which has worked in past per patient.  If morning productive cough remains persistent, want to have in-office evaluation for cough.

## 2020-01-06 NOTE — Patient Instructions (Signed)
Allergies, Adult An allergy means that your body reacts to something that bothers it (allergen). It is not a normal reaction. This can happen from something that you:  Eat.  Breathe in.  Touch. You can have an allergy (be allergic) to:  Outdoor things, like: ? Pollen. ? Grass. ? Weeds.  Indoor things, like: ? Dust. ? Smoke. ? Pet dander.  Foods.  Medicines.  Things that bother your skin, like: ? Detergents. ? Chemicals. ? Latex.  Perfume.  Bugs. An allergy cannot spread from person to person (is not contagious). Follow these instructions at home:         Stay away from things that you know you are allergic to.  If you have allergies to things in the air, wash out your nose each day. Do it with one of these: ? A salt-water (saline) spray. ? A container (neti pot).  Take over-the-counter and prescription medicines only as told by your doctor.  Keep all follow-up visits as told by your doctor. This is important.  If you are at risk for a very bad allergy reaction (anaphylaxis), keep an auto-injector with you all the time. This is called an epinephrine injection. ? This is pre-measured medicine with a needle. You can put it into your skin by yourself. ? Right after you have a very bad allergy reaction, you or a person with you must give the medicine in less than a few minutes. This is an emergency.  If you have ever had a very bad allergy reaction, wear a medical alert bracelet or necklace. Your very bad allergy should be written on it. Contact a health care provider if:  Your symptoms do not get better with treatment. Get help right away if:  You have symptoms of a very bad allergy reaction. These include: ? A swollen mouth, tongue, or throat. ? Pain or tightness in your chest. ? Trouble breathing. ? Being short of breath. ? Dizziness. ? Fainting. ? Very bad pain in your belly (abdomen). ? Throwing up (vomiting). ? Watery poop  (diarrhea). Summary  An allergy means that your body reacts to something that bothers it (allergen). It is not a normal reaction.  Stay away from things that make your body react.  Take over-the-counter and prescription medicines only as told by your doctor.  If you are at risk for a very bad allergy reaction, carry an auto-injector (epinephrine injection) all the time. Also, wear a medical alert bracelet or necklace so people know about your allergy. This information is not intended to replace advice given to you by your health care provider. Make sure you discuss any questions you have with your health care provider. Document Revised: 05/26/2018 Document Reviewed: 05/20/2016 Elsevier Patient Education  [employer].

## 2020-01-17 ENCOUNTER — Other Ambulatory Visit: Payer: Self-pay | Admitting: Nurse Practitioner

## 2020-01-17 ENCOUNTER — Ambulatory Visit: Payer: Self-pay

## 2020-01-17 MED ORDER — FLUCONAZOLE 150 MG PO TABS: 150.0000 mg | ORAL_TABLET | Freq: Once | ORAL | 0 refills | Status: AC

## 2020-01-17 NOTE — Chronic Care Management (AMB) (Signed)
Care Management   Note  01/17/2020 Name: [name] MRN: [id number] DOB: Jun 08, 1950  Cheryl Mendez is a 69 y.o. year old female who is a primary care patient of Marjie Skiff, NP and is actively engaged with the care management team. I reached out to Dante Gang by phone today to assist with re-scheduling an initial visit with the Pharmacist  Follow up plan: Telephone appointment with care management team member scheduled for:02/07/2020  Penne Lash, RMA Care Guide, Embedded Care Coordination [facility]  [place], [place] [address] Direct Dial: [phone] [email or web address] Website: [email or web address]

## 2020-01-17 NOTE — Telephone Encounter (Signed)
Pt verbalized understanding.

## 2020-01-17 NOTE — Telephone Encounter (Signed)
Patient called and says she is having pain and irritation to the outside of her vaginal area when she urinates. She says the urine hits the vulva and burns. She says she was on antibiotics and prednisone that she finished yesterday and she noticed the irritation, redness a few days ago. She says very little if any itching, no vaginal discharge and no itching inside the vagina. She says she was using hydrocortisone cream with aloe, but the pharmacist told her to check with her doctor for something else to use, because it could be something else. She says her vulva is red and hot. She denies any other symptoms. I advised I will send this to [facility] and someone will call back with her recommendation, patient verbalized understanding.  Answer Assessment - Initial Assessment Questions 1. SYMPTOM: "What's the main symptom you're concerned about?" (e.g., pain, itching, dryness)     Pain, irritatio 2. LOCATION: "Where is the irritation located?" (e.g., inside/outside, left/right)     Outside vagina 3. ONSET: "When did the irritation start?"     A couple of days ago 4. PAIN: "Is there any pain?" If Yes, ask: "How bad is it?" (Scale: 1-10; mild, moderate, severe)     No pain until urinate 5. ITCHING: "Is there any itching?" If Yes, ask: "How bad is it?" (Scale: 1-10; mild, moderate, severe)     Not much 6. CAUSE: "What do you think is causing the discharge?" "Have you had the same problem before? What happened then?"     No discharge 7. OTHER SYMPTOMS: "Do you have any other symptoms?" (e.g., fever, itching, vaginal bleeding, pain with urination, injury to genital area, vaginal foreign body)     No 8. PREGNANCY: "Is there any chance you are pregnant?" "When was your last menstrual period?"     No  Answer Assessment - Initial Assessment Questions 1. SYMPTOM: "What's the main symptom you're concerned about?" (e.g., rash, itching, swelling, dryness)     Redness, pain 2. LOCATION: "Where is the redness  located?" (e.g., inside/outside, left/right)     Both sides outside 3. ONSET: "When did the redness start?"     A few days ago 4. PAIN: "Is there any pain?" If Yes, ask: "How bad is it?" (Scale: 1-10; mild, moderate, severe)   -  MILD (1-3): doesn't interfere with normal activities    -  MODERATE (4-7): interferes with normal activities (e.g., work or school) or awakens from sleep     -  SEVERE (8-10): excruciating pain, unable to do any normal activities     When urinate, mild-moderate when sleep 5. CAUSE: "What do you think is causing the symptoms?"     Prednisone and antibiotics I was on 6. OTHER SYMPTOMS: "Do you have any other symptoms?" (e.g., fever, vaginal bleeding, pain with urination)     No 7. PREGNANCY: "Is there any chance you are pregnant?" "When was your last menstrual period?"     No  Protocols used: VAGINAL SYMPTOMS-A-AH, VULVAR [facility]

## 2020-01-17 NOTE — Telephone Encounter (Signed)
Have sent in one time dose Diflucan since she was on abx recently, suspect yeast infection.  If this does not help then would like a visit please.

## 2020-01-27 ENCOUNTER — Telehealth: Payer: Self-pay

## 2020-01-27 NOTE — Telephone Encounter (Signed)
I would recommend taking daily Claritin or Allegra over the counter for continue cough and drainage in morning, this will be helpful with postnasal drainage + continue Diabetic Tussin and nasal spray.  If ongoing or worsening would want her to return to office.

## 2020-01-27 NOTE — Telephone Encounter (Signed)
Copied from [place] [id number]. Topic: General - Inquiry >> Jan 27, 2020  2:15 PM Gillis Ends D wrote: Reason for CRM: She has a question about if she should continue taking a medication. She wants a call back from the nurse. She can be reached at [phone]. Please advise

## 2020-01-27 NOTE — Telephone Encounter (Signed)
Called and spoke to patient. She states that she is much better but it still coughing up a little bit of yellow phlegm in the mornings. Patient states she is not coughing any other times buts wants to know if she needs to take anything else to clear this phlegm up. She states the Robitussin and nasal spray have both helped her.

## 2020-01-27 NOTE — Telephone Encounter (Signed)
Called and notified patient of Cheryl Mendez's message.

## 2020-02-07 ENCOUNTER — Telehealth: Payer: Medicare Other

## 2020-02-07 NOTE — Chronic Care Management (AMB) (Deleted)
Chronic Care Management Pharmacy  Name: Cheryl Mendez  MRN: [id number] DOB: Jun 18, 1950   Chief Complaint/ HPI  Cheryl Mendez,  69 y.o. , female presents for her Initial CCM visit with the clinical pharmacist via telephone.  PCP : Venita Lick, NP Patient Care Team: Venita Lick, NP as PCP - General (Nurse Practitioner) Vladimir Faster, [facility] (Pharmacist)  Patient's chronic conditions include: Hypertension, Hyperlipidemia, Diabetes, GERD and Osteoarthritis   Office Visits: ***  Consult Visit: ***   Subjective: ***  Objective: Allergies  Allergen Reactions  . Penicillin G Benzathine     Medications: Outpatient Encounter Medications as of 02/07/2020  Medication Sig Note  . aspirin 81 MG chewable tablet Chew 81 mg by mouth daily.   Marland Kitchen aspirin-acetaminophen-caffeine (EXCEDRIN MIGRAINE) 250-250-65 MG tablet Take 1 tablet by mouth every 6 (six) hours as needed for headache. 08/05/2018: 1-2 times per week  . atorvastatin (LIPITOR) 10 MG tablet TAKE 1 TABLET(10 MG) BY MOUTH DAILY   . benazepril-hydrochlorthiazide (LOTENSIN HCT) 20-25 MG tablet Take 1 tablet by mouth daily.   . blood glucose meter kit and supplies KIT Dispense based on patient and insurance preference. Use up to four times daily as directed. (FOR ICD-9 250.00, 250.01).   Marland Kitchen empagliflozin (JARDIANCE) 25 MG TABS tablet Take 25 mg by mouth daily.   . fluticasone (FLONASE) 50 MCG/ACT nasal spray Place 2 sprays into both nostrils daily.   Marland Kitchen guaiFENesin (DIABETIC TUSSIN EX) 100 MG/5ML liquid Take 10 mLs (200 mg total) by mouth 3 (three) times daily as needed for cough. (Patient not taking: Reported on 01/06/2020)   . loratadine (CLARITIN) 10 MG tablet TAKE 1 TABLET(10 MG) BY MOUTH DAILY   . metFORMIN (GLUCOPHAGE) 1000 MG tablet Take 1 tablet (1,000 mg total) by mouth 2 (two) times daily with a meal.   . omeprazole (PRILOSEC) 20 MG capsule Take 1 capsule (20 mg total) by mouth daily. (Patient not taking:  Reported on 01/06/2020)   . ONETOUCH DELICA LANCETS FINE MISC USE UP TO FOUR TIMES DAILY AS DIRECTED   . ONETOUCH ULTRA test strip TEST FOUR TIMES DAILY   . Semaglutide (RYBELSUS) 3 MG TABS Take 3 mg by mouth daily. Take 30 minutes before meal in morning. (Patient not taking: Reported on 11/25/2019)    No facility-administered encounter medications on file as of 02/07/2020.    Wt Readings from Last 3 Encounters:  11/25/19 171 lb 12.8 oz (77.9 kg)  07/26/19 166 lb (75.3 kg)  06/28/19 171 lb 12.8 oz (77.9 kg)    Lab Results  Component Value Date   CREATININE 0.62 11/25/2019   BUN 18 11/25/2019   GFRNONAA 92 11/25/2019   GFRAA 106 11/25/2019   NA 136 11/25/2019   K 4.3 11/25/2019   CALCIUM 9.9 11/25/2019   CO2 25 11/25/2019     Current Diagnosis/Assessment:    Goals Addressed   None     Diabetes   A1c goal {A1c goals:23924}  Recent Relevant Labs: Lab Results  Component Value Date/Time   HGBA1C 8.4 (H) 11/25/2019 01:25 PM   HGBA1C 8.8 (H) 06/28/2019 01:33 PM   HGBA1C 7.4% 10/17/2015 12:00 AM   MICROALBUR 10 03/29/2019 10:48 AM   MICROALBUR 10 07/28/2018 10:39 AM    Last diabetic Eye exam:  Lab Results  Component Value Date/Time   HMDIABEYEEXA No Retinopathy 11/04/2014 02:07 PM    Last diabetic Foot exam: No results found for: HMDIABFOOTEX   Checking BG: {CHL HP Blood Glucose Monitoring  Frequency:[phone]}  Recent FBG Readings: *** Recent pre-meal BG readings: *** Recent 2hr PP BG readings:  *** Recent HS BG readings: ***  Patient has failed these meds in past: *** Patient is currently {CHL Controlled/Uncontrolled:501 100 1838} on the following medications: . ***  We discussed: {CHL HP Upstream Pharmacy discussion:[id number]}  Plan  Continue {CHL HP Upstream Pharmacy [phone]}  Hypertension   BP goal is:  {CHL HP UPSTREAM Pharmacist BP ranges:[phone]}  Office blood pressures are  BP Readings from Last 3 Encounters:  11/25/19 113/75   07/26/19 108/82  06/28/19 102/70   Patient checks BP at home {CHL HP BP Monitoring Frequency:[phone]} Patient home BP readings are ranging: ***  Patient has failed these meds in the past: *** Patient is currently {CHL Controlled/Uncontrolled:501 100 1838} on the following medications:  . ***  We discussed {CHL HP Upstream Pharmacy discussion:[id number]}  Plan  Continue {CHL HP Upstream Pharmacy [phone]}      Hyperlipidemia   LDL goal < ***  Last lipids Lab Results  Component Value Date   CHOL 198 06/28/2019   HDL 43 06/28/2019   LDLCALC 102 (H) 06/28/2019   TRIG 313 (H) 06/28/2019   Hepatic Function Latest Ref Rng & Units 11/25/2019 03/29/2019 07/28/2018  Total Protein 6.0 - 8.5 g/dL 7.3 7.4 7.0  Albumin 3.8 - 4.8 g/dL 4.7 4.7 4.4  AST 0 - 40 IU/L _0 ALT 0 - 32 IU/L 33(H) 24 38(H)  Alk Phosphatase 44 - 121 IU/L 49 50 54  Total Bilirubin 0.0 - 1.2 mg/dL 0.5 0.4 0.5     The 10-year ASCVD risk score [name] Jr., et al., 2013) is: 17.5%   Values used to calculate the score:     Age: 27 years     Sex: Female     Is Non-Hispanic African American: No     Diabetic: Yes     Tobacco smoker: No     Systolic Blood Pressure: 945 mmHg     Is BP treated: Yes     HDL Cholesterol: 43 mg/dL     Total Cholesterol: 198 mg/dL   Patient has failed these meds in past: *** Patient is currently {CHL Controlled/Uncontrolled:501 100 1838} on the following medications:  . ***  We discussed:  {CHL HP Upstream Pharmacy discussion:6126522243}  Plan  Continue {CHL HP Upstream Pharmacy [phone]}  GERD   Patient has failed these meds in past: *** Patient is currently {CHL Controlled/Uncontrolled:501 100 1838} on the following medications:  . ***  We discussed:  ***  Plan  Continue {CHL HP Upstream Pharmacy [phone]}   Allergic Rhinitis   Patient has failed these meds in past: *** Patient is currently {CHL Controlled/Uncontrolled:501 100 1838} on the  following medications:  . ***  We discussed:  ***  Plan  Continue {CHL HP Upstream Pharmacy WTUUE:[id number]}    Medication Management   Patient's preferred pharmacy is:  [facility] [place], [place] AT [facility] OF [address] & [address] [place] [address] Phone: [phone] Fax: [phone]  Uses pill box? {Yes or If no, why not?:20788} Pt endorses ***% compliance  We discussed: {Pharmacy options:24294}  Plan  {US Pharmacy BEEF:00712}    Follow up: *** month phone visit  ***

## 2020-02-15 ENCOUNTER — Telehealth: Payer: Self-pay

## 2020-02-15 NOTE — Chronic Care Management (AMB) (Signed)
Care Management   Note  02/15/2020 Name: Cheryl Mendez MRN: [id number] DOB: 02-Jan-1951  Cheryl Mendez is a 69 y.o. year old female who is a primary care patient of Marjie Skiff, NP and is actively engaged with the care management team. I reached out to Dante Gang by phone today to assist with re-scheduling an initial visit with the Pharmacist  Follow up plan: Unsuccessful telephone outreach attempt made. The care management team will reach out to the patient again over the next 7 days.  If patient returns call to provider office, please advise to call Embedded Care Management Care Guide Penne Lash  at [phone]  Penne Lash, RMA Care Guide, Embedded Care Coordination [facility]  [place], [place] [address] Direct Dial: [phone] [email or web address]: [email or web address]

## 2020-02-17 ENCOUNTER — Other Ambulatory Visit: Payer: Self-pay | Admitting: Nurse Practitioner

## 2020-02-23 ENCOUNTER — Other Ambulatory Visit: Payer: Self-pay | Admitting: Nurse Practitioner

## 2020-02-23 MED ORDER — OMEPRAZOLE 20 MG PO CPDR
20.0000 mg | DELAYED_RELEASE_CAPSULE | Freq: Every day | ORAL | 4 refills | Status: DC
Start: 2020-02-23 — End: 2021-01-31

## 2020-02-23 NOTE — Telephone Encounter (Signed)
Medication Refill - Medication: omeprazole (PRILOSEC) 20 MG capsule     Preferred Pharmacy (with phone number or street name):  [facility] #[id number] [place], [place] - [address] AT [facility] OF [address] [facility] [facility] Phone:  [phone]  Fax:  [phone]       Agent: Please be advised that RX refills may take up to 3 business days. We ask that you follow-up with your pharmacy.

## 2020-02-23 NOTE — Chronic Care Management (AMB) (Signed)
Care Management   Note  02/23/2020 Name: Cheryl Mendez MRN: [id number] DOB: 01-Apr-1950  Cheryl Mendez is a 70 y.o. year old female who is a primary care patient of Marjie Skiff, NP and is actively engaged with the care management team. I reached out to Dante Gang by phone today to assist with re-scheduling an initial visit with the Pharmacist  Follow up plan: Patient declines further follow up and engagement by the care management team. Appropriate care team members and provider have been notified via electronic communication.   Penne Lash, RMA Care Guide, Embedded Care Coordination [facility]  [place], [place] [address] Direct Dial: [phone] [email or web address]

## 2020-02-23 NOTE — Telephone Encounter (Signed)
   Notes to clinic: script has expired Review for refill   Requested Prescriptions  Pending Prescriptions Disp Refills   omeprazole (PRILOSEC) 20 MG capsule 90 capsule 2    Sig: Take 1 capsule (20 mg total) by mouth daily.      Gastroenterology: Proton Pump Inhibitors Passed - 02/23/2020  1:25 PM      Passed - Valid encounter within last 12 months    Recent Outpatient Visits           1 month ago Cough   [facility] Valentino Nose, NP   1 month ago Sinus pressure   [facility] Theba, [place] T, NP   3 months ago Poorly controlled type 2 diabetes mellitus (HCC)   [facility] Gabriel Cirri, NP   7 months ago Poorly controlled type 2 diabetes mellitus (HCC)   [facility] Cannady, Jolene T, NP   8 months ago Poorly controlled type 2 diabetes mellitus (HCC)   [facility] Cannady, Dorie Rank, NP       Future Appointments             In 6 days Cannady, Dorie Rank, NP [employer]

## 2020-02-29 ENCOUNTER — Telehealth (INDEPENDENT_AMBULATORY_CARE_PROVIDER_SITE_OTHER): Payer: Medicare Other | Admitting: Nurse Practitioner

## 2020-02-29 ENCOUNTER — Encounter: Payer: Self-pay | Admitting: Nurse Practitioner

## 2020-02-29 DIAGNOSIS — J301 Allergic rhinitis due to pollen: Secondary | ICD-10-CM | POA: Diagnosis not present

## 2020-02-29 MED ORDER — MONTELUKAST SODIUM 10 MG PO TABS: 10.0000 mg | ORAL_TABLET | Freq: Every day | ORAL | 3 refills | Status: DC

## 2020-02-29 MED ORDER — RYBELSUS 3 MG PO TABS: 3.0000 mg | ORAL_TABLET | Freq: Every day | ORAL | 1 refills | Status: DC

## 2020-02-29 NOTE — Progress Notes (Signed)
LMP  (LMP Unknown) Comment: age 70   Subjective:    Patient ID: Cheryl Mendez, female    DOB: [birth year], 70 y.o.   MRN: [id number]  HPI: Cheryl Mendez is a 70 y.o. female  Chief Complaint  Patient presents with  . sinus issues    Patient states she is getting better but is still having drainage in the back of her throat, mostly at night Patient  has not known exposure to Covid     . This visit was completed via telephone due to the restrictions of the COVID-19 pandemic. All issues as above were discussed and addressed but no physical exam was performed. If it was felt that the patient should be evaluated in the office, they were directed there. The patient verbally consented to this visit. Patient was unable to complete an audio/visual visit due to Technical difficulties,Lack of internet. Due to the catastrophic nature of the COVID-19 pandemic, this visit was done through audio contact only. . Location of the patient: home . Location of the provider: work . Those involved with this call:  . Provider: Marnee Guarneri, DNP . CMA: Yvonna Alanis, CMA . Front Desk/Registration: Jill Side  . Time spent on call: 21 minutes on the phone discussing health concerns. 15 minutes total spent in review of patient's record and preparation of their chart.  . I verified patient identity using two factors (patient name and date of birth). Patient consents verbally to being seen via telemedicine visit today.   POSTNASAL DRAINAGE Reports ongoing allergic to symptoms since November on and off.  At this time her symptoms are improving, with exception of some occasional drainage in the back of her throat. Fever: no Cough: only when drainage is in throat Shortness of breath: no Wheezing: no Chest pain: no Chest tightness: no Chest congestion: no Nasal congestion: no Runny nose: yes Post nasal drip: yes Sneezing: no Sore throat: no Swollen glands: no Sinus pressure: no Headache:  no Face pain: no Toothache: no Ear pain: none Ear pressure: none Eyes red/itching:no Eye drainage/crusting: no  Vomiting: no Rash: no Fatigue: no Sick contacts: no Context: fluctuating Recurrent sinusitis: no Relief with OTC cold/cough medications: yes  Treatments attempted: Flonase and Claritin   Relevant past medical, surgical, family and social history reviewed and updated as indicated. Interim medical history since our last visit reviewed. Allergies and medications reviewed and updated.  Review of Systems  Constitutional: Negative for activity change, appetite change, chills, fatigue and fever.  HENT: Positive for postnasal drip and rhinorrhea. Negative for congestion, ear discharge, ear pain, facial swelling, sinus pressure, sinus pain, sneezing, sore throat and voice change.   Respiratory: Positive for cough. Negative for chest tightness, shortness of breath and wheezing.   Cardiovascular: Negative for chest pain, palpitations and leg swelling.  Musculoskeletal: Negative for myalgias.  Psychiatric/Behavioral: Negative.     Per HPI unless specifically indicated above     Objective:    LMP  (LMP Unknown) Comment: age 36  Wt Readings from Last 3 Encounters:  11/25/19 171 lb 12.8 oz (77.9 kg)  07/26/19 166 lb (75.3 kg)  06/28/19 171 lb 12.8 oz (77.9 kg)    Physical Exam   Unable to perform due to telephone visit only.  Results for orders placed or performed in visit on 11/25/19  Bayer DCA Hb A1c Waived  Result Value Ref Range   HB A1C (BAYER DCA - WAIVED) 8.4 (H) <7.0 %  Comprehensive metabolic panel  Result Value Ref  Range   Glucose 161 (H) 65 - 99 mg/dL   BUN 18 8 - 27 mg/dL   Creatinine, Ser 0.62 0.57 - 1.00 mg/dL   GFR calc non Af Amer 92 >59 mL/min/1.73   GFR calc Af Amer 106 >59 mL/min/1.73   BUN/Creatinine Ratio 29 (H) 12 - 28   Sodium 136 134 - 144 mmol/L   Potassium 4.3 3.5 - 5.2 mmol/L   Chloride 94 (L) 96 - 106 mmol/L   CO2 25 20 - 29 mmol/L    Calcium 9.9 8.7 - 10.3 mg/dL   Total Protein 7.3 6.0 - 8.5 g/dL   Albumin 4.7 3.8 - 4.8 g/dL   Globulin, Total 2.6 1.5 - 4.5 g/dL   Albumin/Globulin Ratio 1.8 1.2 - 2.2   Bilirubin Total 0.5 0.0 - 1.2 mg/dL   Alkaline Phosphatase 49 44 - 121 IU/L   AST 28 0 - 40 IU/L   ALT 33 (H) 0 - 32 IU/L      Assessment & Plan:   Problem List Items Addressed This Visit      Respiratory   Allergic rhinitis - Primary    Ongoing issue, low suspicion for infection based on length of symptoms with waxing and waning.  At this time will continue Flonase and add on Singulair at [facility], will have discontinue Claritin daily over next few weeks.  Could consider referral to allergist or ENT in future if ongoing symptoms.  Return in 2-3 weeks for follow-up on chronic diseases and medication changes.         I discussed the assessment and treatment plan with the patient. The patient was provided an opportunity to ask questions and all were answered. The patient agreed with the plan and demonstrated an understanding of the instructions.   The patient was advised to call back or seek an in-person evaluation if the symptoms worsen or if the condition fails to improve as anticipated.   I provided 21+ minutes of time during this encounter.  Follow up plan: Return in about 2 weeks (around 03/14/2020) for T2DM, HTN/HLD.

## 2020-02-29 NOTE — Assessment & Plan Note (Signed)
Ongoing issue, low suspicion for infection based on length of symptoms with waxing and waning.  At this time will continue Flonase and add on [facility] at [facility], will have discontinue Claritin daily over next few weeks.  Could consider referral to allergist or ENT in future if ongoing symptoms.  Return in 2-3 weeks for follow-up on chronic diseases and medication changes.

## 2020-02-29 NOTE — Patient Instructions (Signed)
Murray &amp; Nadel's Textbook of Respiratory Medicine (7th ed., pp. 508-520). Elsevier.">  Postnasal Drip Postnasal drip is the feeling of mucus going down the back of your throat. Mucus is a slimy substance that moistens and cleans your nose and throat, as well as the air pockets in face bones near your forehead and cheeks (sinuses). Small amounts of mucus pass from your nose and sinuses down the back of your throat all the time. This is normal. When you produce too much mucus or the mucus gets too thick, you can feel it. Some common causes of postnasal drip include:  Having more mucus because of: ? A cold or the flu. ? Allergies. ? Cold air. ? Certain medicines.  Having more mucus that is thicker because of: ? A sinus or nasal infection. ? Dry air. ? A food allergy. Follow these instructions at home: Relieving discomfort  Gargle with a salt-water mixture 3-4 times a day or as needed. To make a salt-water mixture, completely dissolve -1 tsp of salt in 1 cup of warm water.  If the air in your home is dry, use a humidifier to add moisture to the air.  Use a saline spray or container (neti pot) to flush out the nose (nasal irrigation). These methods can help clear away mucus and keep the nasal passages moist.   General instructions  Take over-the-counter and prescription medicines only as told by your health care provider.  Follow instructions from your health care provider about eating or drinking restrictions. You may need to avoid caffeine.  Avoid things that you know you are allergic to (allergens), like dust, mold, pollen, pets, or certain foods.  Drink enough fluid to keep your urine pale yellow.  Keep all follow-up visits as told by your health care provider. This is important. Contact a health care provider if:  You have a fever.  You have a sore throat.  You have difficulty swallowing.  You have headache.  You have sinus pain.  You have a cough that does not go  away.  The mucus from your nose becomes thick and is green or yellow in color.  You have cold or flu symptoms that last more than 10 days. Summary  Postnasal drip is the feeling of mucus going down the back of your throat.  If your health care provider approves, use nasal irrigation or a nasal spray 2?4 times a day.  Avoid things that you know you are allergic to (allergens), like dust, mold, pollen, pets, or certain foods. This information is not intended to replace advice given to you by your health care provider. Make sure you discuss any questions you have with your health care provider. Document Revised: 11/16/2019 Document Reviewed: 11/16/2019 Elsevier Patient Education  2021 Elsevier Inc.  

## 2020-03-28 ENCOUNTER — Ambulatory Visit (INDEPENDENT_AMBULATORY_CARE_PROVIDER_SITE_OTHER): Payer: Medicare Other | Admitting: Nurse Practitioner

## 2020-03-28 ENCOUNTER — Encounter: Payer: Self-pay | Admitting: Nurse Practitioner

## 2020-03-28 ENCOUNTER — Other Ambulatory Visit: Payer: Self-pay

## 2020-03-28 VITALS — BP 107/78 | HR 94 | Temp 98.1°F | Ht 65.28 in | Wt 176.6 lb

## 2020-03-28 DIAGNOSIS — E538 Deficiency of other specified B group vitamins: Secondary | ICD-10-CM | POA: Insufficient documentation

## 2020-03-28 DIAGNOSIS — E1165 Type 2 diabetes mellitus with hyperglycemia: Secondary | ICD-10-CM | POA: Diagnosis not present

## 2020-03-28 DIAGNOSIS — I152 Hypertension secondary to endocrine disorders: Secondary | ICD-10-CM

## 2020-03-28 DIAGNOSIS — E1169 Type 2 diabetes mellitus with other specified complication: Secondary | ICD-10-CM | POA: Diagnosis not present

## 2020-03-28 DIAGNOSIS — J301 Allergic rhinitis due to pollen: Secondary | ICD-10-CM | POA: Diagnosis not present

## 2020-03-28 DIAGNOSIS — E1159 Type 2 diabetes mellitus with other circulatory complications: Secondary | ICD-10-CM

## 2020-03-28 DIAGNOSIS — E785 Hyperlipidemia, unspecified: Secondary | ICD-10-CM | POA: Diagnosis not present

## 2020-03-28 LAB — MICROALBUMIN, URINE WAIVED
Creatinine, Urine Waived: 200 mg/dL (ref 10–300)
Microalb, Ur Waived: 30 mg/L — ABNORMAL HIGH (ref 0–19)
Microalb/Creat Ratio: <30 mg/g (ref <30)

## 2020-03-28 LAB — BAYER DCA HB A1C WAIVED: HB A1C (BAYER DCA - WAIVED): 9.3 % — ABNORMAL HIGH (ref <7.0)

## 2020-03-28 MED ORDER — RYBELSUS 7 MG PO TABS: 1.0000 | ORAL_TABLET | Freq: Every day | ORAL | 12 refills | Status: DC

## 2020-03-28 NOTE — Assessment & Plan Note (Signed)
Chronic, ongoing.  Continue current medication regimen and adjust as needed.  Lipid panel today and may increase dose if elevations above goal, discussed with her.

## 2020-03-28 NOTE — Assessment & Plan Note (Signed)
Chronic, stable with BP below goal in office today.  Continue current medication regimen and adjust as needed -- Lotensin offering kidney protection.  Recommend checking BP at home three mornings a week and documenting for provider review.  BMP and TSH today.  Return in 3 months.

## 2020-03-28 NOTE — Patient Instructions (Addendum)
Complete current Rybelsus supply by taking 2 tablets a day and then pick up new prescription 7 MG dosing from pharmacy -- takes one tablet a day of this.  GOAL FOR MORNING FASTING BLOOD SUGARS IS <130!!   Start taking Vitamin B12 1000 MCG daily at this time -- can obtain in vitamin section in [facility] or drug store.  Vitamin B12 Deficiency Vitamin B12 deficiency occurs when the body does not have enough vitamin B12, which is an important vitamin. The body needs this vitamin:  To make red blood cells.  To make DNA. This is the genetic material inside cells.  To help the nerves work properly so they can carry messages from the brain to the body. Vitamin B12 deficiency can cause various health problems, such as a low red blood cell count (anemia) or nerve damage. What are the causes? This condition may be caused by:  Not eating enough foods that contain vitamin B12.  Not having enough stomach acid and digestive fluids to properly absorb vitamin B12 from the food that you eat.  Certain digestive system diseases that make it hard to absorb vitamin B12. These diseases include Crohn's disease, chronic pancreatitis, and cystic fibrosis.  A condition in which the body does not make enough of a protein (intrinsic factor), resulting in too few red blood cells (pernicious anemia).  Having a surgery in which part of the stomach or small intestine is removed.  Taking certain medicines that make it hard for the body to absorb vitamin B12. These medicines include: ? Heartburn medicines (antacids and proton pump inhibitors). ? Certain antibiotic medicines. ? Some medicines that are used to treat diabetes, tuberculosis, gout, or high cholesterol. What increases the risk? The following factors may make you more likely to develop a B12 deficiency:  Being older than age 16.  Eating a vegetarian or vegan diet, especially while you are pregnant.  Eating a poor diet while you are pregnant.  Taking  certain medicines.  Having alcoholism. What are the signs or symptoms? In some cases, there are no symptoms of this condition. If the condition leads to anemia or nerve damage, various symptoms can occur, such as:  Weakness.  Fatigue.  Loss of appetite.  Weight loss.  Numbness or tingling in your hands and feet.  Redness and burning of the tongue.  Confusion or memory problems.  Depression.  Sensory problems, such as color blindness, ringing in the ears, or loss of taste.  Diarrhea or constipation.  Trouble walking. If anemia is severe, symptoms can include:  Shortness of breath.  Dizziness.  Rapid heart rate (tachycardia). How is this diagnosed? This condition may be diagnosed with a blood test to measure the level of vitamin B12 in your blood. You may also have other tests, including:  A group of tests that measure certain characteristics of blood cells (complete blood count, CBC).  A blood test to measure intrinsic factor.  A procedure where a thin tube with a camera on the end is used to look into your stomach or intestines (endoscopy). Other tests may be needed to discover the cause of B12 deficiency. How is this treated? Treatment for this condition depends on the cause. This condition may be treated by:  Changing your eating and drinking habits, such as: ? Eating more foods that contain vitamin B12. ? Drinking less alcohol or no alcohol.  Getting vitamin B12 injections.  Taking vitamin B12 supplements. Your health care provider will tell you which dosage is best for you.  Follow these instructions at home: Eating and drinking  Eat lots of healthy foods that contain vitamin B12, including: ? Meats and poultry. This includes beef, pork, chicken, [place], and organ meats, such as liver. ? Seafood. This includes clams, rainbow trout, salmon, tuna, and haddock. ? Eggs. ? Cereal and dairy products that are fortified. This means that vitamin B12 has been  added to the food. Check the label on the package to see if the food is fortified. The items listed above may not be a complete list of recommended foods and beverages. Contact a dietitian for more information.   General instructions  Get any injections that are prescribed by your health care provider.  Take supplements only as told by your health care provider. Follow the directions carefully.  Do not drink alcohol if your health care provider tells you not to. In some cases, you may only be asked to limit alcohol use.  Keep all follow-up visits as told by your health care provider. This is important. Contact a health care provider if:  Your symptoms come back. Get help right away if you:  Develop shortness of breath.  Have a rapid heart rate.  Have chest pain.  Become dizzy or lose consciousness. Summary  Vitamin B12 deficiency occurs when the body does not have enough vitamin B12.  The main causes of vitamin B12 deficiency include dietary deficiency, digestive diseases, pernicious anemia, and having a surgery in which part of the stomach or small intestine is removed.  In some cases, there are no symptoms of this condition. If the condition leads to anemia or nerve damage, various symptoms can occur, such as weakness, shortness of breath, and numbness.  Treatment may include getting vitamin B12 injections or taking vitamin B12 supplements. Eat lots of healthy foods that contain vitamin B12. This information is not intended to replace advice given to you by your health care provider. Make sure you discuss any questions you have with your health care provider. Document Revised: 07/24/2018 Document Reviewed: 10/14/2017 Elsevier Patient Education  2021 [employer].

## 2020-03-28 NOTE — Assessment & Plan Note (Signed)
Noted low normal levels on labs in past, with mild decrease noted on monofilament exam and long term Metformin use.  Recommend she start taking Vitamin B12 500 to 1000 MCG daily.  Recheck level today.  Explained to patient the benefit of annual B12 level testing while on daily Metformin.  Educated patient on the effect of Metformin on absorption of Vitamin B12 and symptoms of low B12 level, such as neuropathic pain.  "Reduced absorption of vitamin B12 is a known adverse effect of long-term use of Metformin" and "routine B12 monitoring may be considered in patients with poor dietary intake or absorption" Pollie Meyer, D. J., 2019 via UpToDate).

## 2020-03-28 NOTE — Progress Notes (Signed)
BP 107/78   Pulse 94   Temp 98.1 F (36.7 C) (Oral)   Ht 5' 5.28" (1.658 m)   Wt 176 lb 9.6 oz (80.1 kg)   LMP  (LMP Unknown) Comment: age 70  SpO2 100%   BMI 29.14 kg/m    Subjective:    Patient ID: Cheryl Mendez, female    DOB: 28-Feb-1950, 70 y.o.   MRN: [id number]  HPI: Cheryl Mendez is a 70 y.o. female  Chief Complaint  Patient presents with  . Diabetes  . Hypertension  . Hyperlipidemia   DIABETES Last A1C in October 8.4%.  Continues on Metformin 1000 MG BID and Rybelsus --started recent visit, but just started taking first of January -- states sugars are trending down with this. Previously has been on Trulicity, which she reports caused mild GI issues. Glipizide has been taken in past.  Jardiance caused UTI frequently.  Her mother, who she cared for, passed in June 2021 and she reports she is slowly beginning to try taking care of herself.  Is working on diet changes, just started.  Recently was using Robitussin cough medicine and lots of honey candies due to sinus infection. Hypoglycemic episodes:no Polydipsia/polyuria:no Visual disturbance:no Chest pain:no Paresthesias:no Glucose Monitoring:no Accucheck frequency: a few times a week Fasting glucose: 216 last time she checked, trending down Post prandial: Evening: Before meals: Taking Insulin?:no Long acting insulin: Short acting insulin: Blood Pressure Monitoring:not checking Retinal Examination:Up to Date -- had performed at [facility] to Date Pneumovax:Up to Date Influenza:Up to Date Aspirin:yes  HYPERTENSION / HYPERLIPIDEMIA Currently taking Lotensin 20-25 and Atorvastatin 10 MG.  She did eat this morning, a little cereal.  Recent LDL 102 and TCHOL 198, AST/ALT 28/33. Satisfied with current treatment?yes Duration of hypertension:chronic BP monitoring frequency:not checking BP range:  BP  medication side effects:no Duration of hyperlipidemia:chronic Cholesterol medication side effects:no Cholesterol supplements: none Medication compliance:good compliance Aspirin:yes Recent stressors:no Recurrent headaches:no Visual changes:no Palpitations:no Dyspnea:no Chest pain:no Lower extremity edema:no Dizzy/lightheaded:no The 10-year ASCVD risk score [name] Jr., et al., 2013) is: 15.8%   Values used to calculate the score:     Age: 78 years     Sex: Female     Is Non-Hispanic African American: No     Diabetic: Yes     Tobacco smoker: No     Systolic Blood Pressure: 431 mmHg     Is BP treated: Yes     HDL Cholesterol: 43 mg/dL     Total Cholesterol: 198 mg/dL  ALLERGIES Continues on Flonase, Claritin, Singulair for sinus issues and allergies.  Has struggled with this for a long while.  Reports this regimen helps, but does continue to struggle with some sinus drainage after recent infection. Duration: chronic Runny nose: yes "clear Nasal congestion: yes Nasal itching: no Sneezing: no Eye swelling, itching or discharge: no Post nasal drip: yes Cough: no Sinus pressure: no  Ear pain: none Ear pressure: none Fever: none Symptoms occur seasonally: no Symptoms occur perenially: yes Satisfied with current treatment: yes Allergist evaluation in past: no Allergen injection immunotherapy: no Recurrent sinus infections: occasional ENT evaluation in past: no Known environmental allergy: no Indoor pets: yes History of asthma: no Current allergy medications: as above Treatments attempted: multiple different medications  Relevant past medical, surgical, family and social history reviewed and updated as indicated. Interim medical history since our last visit reviewed. Allergies and medications reviewed and updated.  Review of Systems  Constitutional: Negative for activity change,  appetite change, diaphoresis, fatigue and fever.  Respiratory: Negative for  cough, chest tightness and shortness of breath.   Cardiovascular: Negative for chest pain, palpitations and leg swelling.  Gastrointestinal: Negative.   Endocrine: Negative for cold intolerance, heat intolerance, polydipsia, polyphagia and polyuria.  Neurological: Negative.   Psychiatric/Behavioral: Negative.    Per HPI unless specifically indicated above     Objective:    BP 107/78   Pulse 94   Temp 98.1 F (36.7 C) (Oral)   Ht 5' 5.28" (1.658 m)   Wt 176 lb 9.6 oz (80.1 kg)   LMP  (LMP Unknown) Comment: age 66  SpO2 100%   BMI 29.14 kg/m   Wt Readings from Last 3 Encounters:  03/28/20 176 lb 9.6 oz (80.1 kg)  11/25/19 171 lb 12.8 oz (77.9 kg)  07/26/19 166 lb (75.3 kg)    Physical Exam Vitals and nursing note reviewed.  Constitutional:      General: She is awake. She is not in acute distress.    Appearance: She is well-developed and overweight. She is not ill-appearing.  HENT:     Head: Normocephalic.     Right Ear: Hearing normal.     Left Ear: Hearing normal.  Eyes:     General: Lids are normal.        Right eye: No discharge.        Left eye: No discharge.     Conjunctiva/sclera: Conjunctivae normal.     Pupils: Pupils are equal, round, and reactive to light.  Neck:     Thyroid: No thyromegaly.     Vascular: No carotid bruit or JVD.  Cardiovascular:     Rate and Rhythm: Normal rate and regular rhythm.     Heart sounds: Normal heart sounds. No murmur heard. No gallop.   Pulmonary:     Effort: Pulmonary effort is normal. No accessory muscle usage or respiratory distress.     Breath sounds: Normal breath sounds.  Abdominal:     General: Bowel sounds are normal.     Palpations: Abdomen is soft.  Musculoskeletal:     Cervical back: Normal range of motion and neck supple.     Right lower leg: No edema.     Left lower leg: No edema.  Lymphadenopathy:     Cervical: No cervical adenopathy.  Skin:    General: Skin is warm and dry.  Neurological:     Mental  Status: She is alert and oriented to person, place, and time.     Cranial Nerves: Cranial nerves are intact.     Motor: Motor function is intact.     Coordination: Coordination is intact.     Gait: Gait is intact.     Deep Tendon Reflexes: Reflexes are normal and symmetric.     Reflex Scores:      Brachioradialis reflexes are 2+ on the right side and 2+ on the left side.      Patellar reflexes are 2+ on the right side and 2+ on the left side. Psychiatric:        Attention and Perception: Attention normal.        Mood and Affect: Mood normal.        Behavior: Behavior normal. Behavior is cooperative.        Thought Content: Thought content normal.        Judgment: Judgment normal.    Diabetic Foot Exam - Simple   Simple Foot Form Visual Inspection See comments: Yes Sensation Testing  See comments: Yes Pulse Check See comments: Yes Comments Pulses DP and PT bilaterally 1+.  Monofilament left side 7/10 and right 6/10.  Varicosities present bilaterally, no edema, mildly dry skin and decreased hair pattern.     Results for orders placed or performed in visit on 11/25/19  Bayer DCA Hb A1c Waived  Result Value Ref Range   HB A1C (BAYER DCA - WAIVED) 8.4 (H) <7.0 %  Comprehensive metabolic panel  Result Value Ref Range   Glucose 161 (H) 65 - 99 mg/dL   BUN 18 8 - 27 mg/dL   Creatinine, Ser 0.62 0.57 - 1.00 mg/dL   GFR calc non Af Amer 92 >59 mL/min/1.73   GFR calc Af Amer 106 >59 mL/min/1.73   BUN/Creatinine Ratio 29 (H) 12 - 28   Sodium 136 134 - 144 mmol/L   Potassium 4.3 3.5 - 5.2 mmol/L   Chloride 94 (L) 96 - 106 mmol/L   CO2 25 20 - 29 mmol/L   Calcium 9.9 8.7 - 10.3 mg/dL   Total Protein 7.3 6.0 - 8.5 g/dL   Albumin 4.7 3.8 - 4.8 g/dL   Globulin, Total 2.6 1.5 - 4.5 g/dL   Albumin/Globulin Ratio 1.8 1.2 - 2.2   Bilirubin Total 0.5 0.0 - 1.2 mg/dL   Alkaline Phosphatase 49 44 - 121 IU/L   AST 28 0 - 40 IU/L   ALT 33 (H) 0 - 32 IU/L      Assessment & Plan:    Problem List Items Addressed This Visit      Cardiovascular and Mediastinum   Hypertension associated with diabetes ([place])    Chronic, stable with BP below goal in office today.  Continue current medication regimen and adjust as needed -- Lotensin offering kidney protection.  Recommend checking BP at home three mornings a week and documenting for provider review.  BMP and TSH today.  Return in 3 months.      Relevant Medications   Semaglutide (RYBELSUS) 7 MG TABS   Other Relevant Orders   Bayer DCA Hb A1c Waived (STAT)   Basic Metabolic Panel (BMET)   Microalbumin, Urine Waived   TSH     Respiratory   Allergic rhinitis    Ongoing issue.  At this time will continue Flonase and [facility]ulair at [facility], will have discontinue Claritin daily over next few weeks dependent on symptoms.  Could consider referral to allergist or ENT in future if ongoing symptoms.  Return in 3 months for follow-up.        Endocrine   Hyperlipidemia associated with type 2 diabetes mellitus (HCC)    Chronic, ongoing.  Continue current medication regimen and adjust as needed.  Lipid panel today and may increase dose if elevations above goal, discussed with her.      Relevant Medications   Semaglutide (RYBELSUS) 7 MG TABS   Other Relevant Orders   Bayer DCA Hb A1c Waived (STAT)   Lipid Profile   Poorly controlled type 2 diabetes mellitus ([place]) - Primary    Chronic, ongoing with poor adherence at times to diet which we are working on.  A1C today trend up to 9.3%, recently started Rybelsus.  Urine ALB 30 today, continue Lotensin.  Continue Metformin 1000 M BID and increase Rybelsus to 7 MG daily (to finish out her 3 MG tablets by taking two a day and then pick up new script).  Continue diet changes at home.  Highly recommend she monitor BS daily at home and document daily with  fasting BS goal of <130.  Will have return in 30 days for follow-up, plan to further increase to 14 MG if tolerating Rybelsus.  Due to age would  prefer to avoid return to Glipizide and prefer to avoid insulin -- discussed with her at length.        Relevant Medications   Semaglutide (RYBELSUS) 7 MG TABS   Other Relevant Orders   Bayer DCA Hb A1c Waived (STAT)     Other   B12 deficiency    Noted low normal levels on labs in past, with mild decrease noted on monofilament exam and long term Metformin use.  Recommend she start taking Vitamin B12 500 to 1000 MCG daily.  Recheck level today.  Explained to patient the benefit of annual B12 level testing while on daily Metformin.  Educated patient on the effect of Metformin on absorption of Vitamin B12 and symptoms of low B12 level, such as neuropathic pain.  "Reduced absorption of vitamin B12 is a known adverse effect of long-term use of Metformin" and "routine B12 monitoring may be considered in patients with poor dietary intake or absorption" Pollie Meyer, D. J., 2019 via UpToDate).         Relevant Orders   Vitamin B12       Follow up plan: Return in about 4 weeks (around 04/25/2020) for T2DM.

## 2020-03-28 NOTE — Assessment & Plan Note (Addendum)
Chronic, ongoing with poor adherence at times to diet which we are working on.  A1C today trend up to 9.3%, recently started Rybelsus.  Urine ALB 30 today, continue Lotensin.  Continue Metformin 1000 M BID and increase Rybelsus to 7 MG daily (to finish out her 3 MG tablets by taking two a day and then pick up new script).  Continue diet changes at home.  Highly recommend she monitor BS daily at home and document daily with fasting BS goal of <130.  Will have return in 30 days for follow-up, plan to further increase to 14 MG if tolerating Rybelsus.  Due to age would prefer to avoid return to Glipizide and prefer to avoid insulin -- discussed with her at length.

## 2020-03-28 NOTE — Assessment & Plan Note (Signed)
Ongoing issue.  At this time will continue Flonase and Singulair at [facility], will have discontinue Claritin daily over next few weeks dependent on symptoms.  Could consider referral to allergist or ENT in future if ongoing symptoms.  Return in 3 months for follow-up.

## 2020-03-29 LAB — LIPID PANEL
Chol/HDL Ratio: 4.8 ratio — ABNORMAL HIGH (ref 0.0–4.4)
Cholesterol, Total: 167 mg/dL (ref 100–199)
HDL: 35 mg/dL — ABNORMAL LOW (ref >39)
LDL Chol Calc (NIH): 84 mg/dL (ref 0–99)
Triglycerides: 293 mg/dL — ABNORMAL HIGH (ref 0–149)
VLDL Cholesterol Cal: 48 mg/dL — ABNORMAL HIGH (ref 5–40)

## 2020-03-29 LAB — BASIC METABOLIC PANEL
BUN/Creatinine Ratio: 20 (ref 12–28)
BUN: 14 mg/dL (ref 8–27)
CO2: 26 mmol/L (ref 20–29)
Calcium: 10.1 mg/dL (ref 8.7–10.3)
Chloride: 96 mmol/L (ref 96–106)
Creatinine, Ser: 0.69 mg/dL (ref 0.57–1.00)
GFR calc Af Amer: 103 mL/min/{1.73_m2} (ref >59)
GFR calc non Af Amer: 89 mL/min/{1.73_m2} (ref >59)
Glucose: 194 mg/dL — ABNORMAL HIGH (ref 65–99)
Potassium: 4.2 mmol/L (ref 3.5–5.2)
Sodium: 142 mmol/L (ref 134–144)

## 2020-03-29 LAB — TSH: TSH: 3.72 u[IU]/mL (ref 0.450–4.500)

## 2020-03-29 LAB — VITAMIN B12: Vitamin B-12: 485 pg/mL (ref 232–1245)

## 2020-03-29 NOTE — Progress Notes (Signed)
Please let Marisue know her labs have returned.  Cholesterol levels show improvement in LDL this check, continue statin daily.  Triglycerides remain elevated, may not hurt to take a little fish oil daily and highly recommend heavy focus on diet decreasing sugars and saturated fats.  Kidney function is stable.  Continue all current medications and monitor diet closely, will see her in 4 weeks.  If any questions let me know. Keep being awesome!!  Thank you for allowing me to participate in your care. Kindest regards, Arianis [place]ditch

## 2020-04-17 ENCOUNTER — Other Ambulatory Visit: Payer: Self-pay | Admitting: Nurse Practitioner

## 2020-04-17 NOTE — Telephone Encounter (Signed)
Requested Prescriptions  Pending Prescriptions Disp Refills   metFORMIN (GLUCOPHAGE) 1000 MG tablet [Pharmacy Med Name: METFORMIN $RemoveBeforeD'1000MG'[id number]$  TABLETS] 180 tablet 3    Sig: TAKE 1 TABLET(1000 MG) BY MOUTH TWICE DAILY WITH A MEAL     Endocrinology:  Diabetes - Biguanides Failed - 04/17/2020  7:03 AM      Failed - HBA1C is between 0 and 7.9 and within 180 days    Hemoglobin A1C  Date Value Ref Range Status  10/17/2015 7.4%  Final   HB A1C (BAYER DCA - WAIVED)  Date Value Ref Range Status  03/28/2020 9.3 (H) <7.0 % Final    Comment:                                          Diabetic Adult            <7.0                                       Healthy Adult        4.3 - 5.7                                                           (DCCT/NGSP) American Diabetes Association's Summary of Glycemic Recommendations for Adults with Diabetes: Hemoglobin A1c <7.0%. More stringent glycemic goals (A1c <6.0%) may further reduce complications at the cost of increased risk of hypoglycemia.          Passed - Cr in normal range and within 360 days    Creatinine  Date Value Ref Range Status  06/09/2014 0.73 mg/dL Final    Comment:    0.44-1.00 NOTE: New Reference Range  04/26/14    Creatinine, Ser  Date Value Ref Range Status  03/28/2020 0.69 0.57 - 1.00 mg/dL Final         Passed - eGFR in normal range and within 360 days    EGFR (African American)  Date Value Ref Range Status  06/09/2014 >60  Final   GFR calc Af Amer  Date Value Ref Range Status  03/28/2020 103 >59 mL/min/1.73 Final    Comment:    **In accordance with recommendations from the NKF-ASN Task force,**   [facility] is in the process of updating its eGFR calculation to the   2021 CKD-EPI creatinine equation that estimates kidney function   without a race variable.    EGFR (Non-African Amer.)  Date Value Ref Range Status  06/09/2014 >60  Final    Comment:    eGFR values <77mL/min/1.73 m2 may be an indication of chronic kidney  disease (CKD). Calculated eGFR is useful in patients with stable renal function. The eGFR calculation will not be reliable in acutely ill patients when serum creatinine is changing rapidly. It is not useful in patients on dialysis. The eGFR calculation may not be applicable to patients at the low and high extremes of body sizes, pregnant women, and vegetarians.    GFR calc non Af Amer  Date Value Ref Range Status  03/28/2020 89 >59 mL/min/1.73 Final         Passed - Valid encounter  within last 6 months    Recent Outpatient Visits          2 weeks ago Poorly controlled type 2 diabetes mellitus ([place])   [place], Darling T, NP   1 month ago Seasonal allergic rhinitis due to pollen   [facility] [place], Nelson T, NP   3 months ago Cough   [facility] Eulogio Bear, NP   3 months ago Sinus pressure   [place], McArthur T, NP   4 months ago Poorly controlled type 2 diabetes mellitus ([place])   [place] Kathrine Haddock, NP      Future Appointments            In 1 week Cannady, Barbaraann Faster, NP [facility]            benazepril-hydrochlorthiazide (LOTENSIN HCT) 20-25 MG tablet [Pharmacy Med Name: BENAZEPRIL/HCTZ 20/25MG  TABLETS] 90 tablet 3    Sig: TAKE 1 TABLET BY MOUTH DAILY     Cardiovascular:  ACEI + Diuretic Combos Passed - 04/17/2020  7:03 AM      Passed - Na in normal range and within 180 days    Sodium  Date Value Ref Range Status  03/28/2020 142 134 - 144 mmol/L Final  06/09/2014 136 mmol/L Final    Comment:    135-145 NOTE: New Reference Range  04/26/14          Passed - K in normal range and within 180 days    Potassium  Date Value Ref Range Status  03/28/2020 4.2 3.5 - 5.2 mmol/L Final  06/09/2014 3.3 (L) mmol/L Final    Comment:    3.5-5.1 NOTE: New Reference Range  04/26/14          Passed - Cr in normal range and within 180 days     Creatinine  Date Value Ref Range Status  06/09/2014 0.73 mg/dL Final    Comment:    0.44-1.00 NOTE: New Reference Range  04/26/14    Creatinine, Ser  Date Value Ref Range Status  03/28/2020 0.69 0.57 - 1.00 mg/dL Final         Passed - Ca in normal range and within 180 days    Calcium  Date Value Ref Range Status  03/28/2020 10.1 8.7 - 10.3 mg/dL Final   Calcium, Total  Date Value Ref Range Status  06/09/2014 9.6 mg/dL Final    Comment:    8.9-10.3 NOTE: New Reference Range  04/26/14          Passed - Patient is not pregnant      Passed - Last BP in normal range    BP Readings from Last 1 Encounters:  03/28/20 107/78         Passed - Valid encounter within last 6 months    Recent Outpatient Visits          2 weeks ago Poorly controlled type 2 diabetes mellitus ([place])   [place], [place] T, NP   1 month ago Seasonal allergic rhinitis due to pollen   [facility] [place], [place] T, NP   3 months ago Cough   [facility] Eulogio Bear, NP   3 months ago Sinus pressure   [place], [place] T, NP   4 months ago Poorly controlled type 2 diabetes mellitus ([place])   [place] Kathrine Haddock, NP      Future Appointments  In 1 week Cannady, Barbaraann Faster, NP [facility]

## 2020-04-27 ENCOUNTER — Encounter: Payer: Self-pay | Admitting: Nurse Practitioner

## 2020-04-27 ENCOUNTER — Other Ambulatory Visit: Payer: Self-pay

## 2020-04-27 ENCOUNTER — Ambulatory Visit (INDEPENDENT_AMBULATORY_CARE_PROVIDER_SITE_OTHER): Payer: Medicare Other | Admitting: Nurse Practitioner

## 2020-04-27 DIAGNOSIS — E1165 Type 2 diabetes mellitus with hyperglycemia: Secondary | ICD-10-CM | POA: Diagnosis not present

## 2020-04-27 NOTE — Patient Instructions (Signed)
Diabetes Mellitus and Nutrition, Adult When you have diabetes, or diabetes mellitus, it is very important to have healthy eating habits because your blood sugar (glucose) levels are greatly affected by what you eat and drink. Eating healthy foods in the right amounts, at about the same times every day, can help you:  Control your blood glucose.  Lower your risk of heart disease.  Improve your blood pressure.  Reach or maintain a healthy weight. What can affect my meal plan? Every person with diabetes is different, and each person has different needs for a meal plan. Your health care provider may recommend that you work with a dietitian to make a meal plan that is best for you. Your meal plan may vary depending on factors such as:  The calories you need.  The medicines you take.  Your weight.  Your blood glucose, blood pressure, and cholesterol levels.  Your activity level.  Other health conditions you have, such as heart or kidney disease. How do carbohydrates affect me? Carbohydrates, also called carbs, affect your blood glucose level more than any other type of food. Eating carbs naturally raises the amount of glucose in your blood. Carb counting is a method for keeping track of how many carbs you eat. Counting carbs is important to keep your blood glucose at a healthy level, especially if you use insulin or take certain oral diabetes medicines. It is important to know how many carbs you can safely have in each meal. This is different for every person. Your dietitian can help you calculate how many carbs you should have at each meal and for each snack. How does alcohol affect me? Alcohol can cause a sudden decrease in blood glucose (hypoglycemia), especially if you use insulin or take certain oral diabetes medicines. Hypoglycemia can be a life-threatening condition. Symptoms of hypoglycemia, such as sleepiness, dizziness, and confusion, are similar to symptoms of having too much  alcohol.  Do not drink alcohol if: ? Your health care provider tells you not to drink. ? You are pregnant, may be pregnant, or are planning to become pregnant.  If you drink alcohol: ? Do not drink on an empty stomach. ? Limit how much you use to:  0-1 drink a day for women.  0-2 drinks a day for men. ? Be aware of how much alcohol is in your drink. In the [place] one drink equals one 12 oz bottle of beer (355 mL), one 5 oz glass of wine (148 mL), or one 1 oz glass of hard liquor (44 mL). ? Keep yourself hydrated with water, diet soda, or unsweetened iced tea.  Keep in mind that regular soda, juice, and other mixers may contain a lot of sugar and must be counted as carbs. What are tips for following this plan? Reading food labels  Start by checking the serving size on the "Nutrition Facts" label of packaged foods and drinks. The amount of calories, carbs, fats, and other nutrients listed on the label is based on one serving of the item. Many items contain more than one serving per package.  Check the total grams (g) of carbs in one serving. You can calculate the number of servings of carbs in one serving by dividing the total carbs by 15. For example, if a food has 30 g of total carbs per serving, it would be equal to 2 servings of carbs.  Check the number of grams (g) of saturated fats and trans fats in one serving. Choose foods that have   a low amount or none of these fats.  Check the number of milligrams (mg) of salt (sodium) in one serving. Most people should limit total sodium intake to less than 2,300 mg per day.  Always check the nutrition information of foods labeled as "low-fat" or "nonfat." These foods may be higher in added sugar or refined carbs and should be avoided.  Talk to your dietitian to identify your daily goals for nutrients listed on the label. Shopping  Avoid buying canned, pre-made, or processed foods. These foods tend to be high in fat, sodium, and added  sugar.  Shop around the outside edge of the grocery store. This is where you will most often find fresh fruits and vegetables, bulk grains, fresh meats, and fresh dairy. Cooking  Use low-heat cooking methods, such as baking, instead of high-heat cooking methods like deep frying.  Cook using healthy oils, such as olive, canola, or sunflower oil.  Avoid cooking with butter, cream, or high-fat meats. Meal planning  Eat meals and snacks regularly, preferably at the same times every day. Avoid going long periods of time without eating.  Eat foods that are high in fiber, such as fresh fruits, vegetables, beans, and whole grains. Talk with your dietitian about how many servings of carbs you can eat at each meal.  Eat 4-6 oz (112-168 g) of lean protein each day, such as lean meat, chicken, fish, eggs, or tofu. One ounce (oz) of lean protein is equal to: ? 1 oz (28 g) of meat, chicken, or fish. ? 1 egg. ?  cup (62 g) of tofu.  Eat some foods each day that contain healthy fats, such as avocado, nuts, seeds, and fish.   What foods should I eat? Fruits Berries. Apples. Oranges. Peaches. Apricots. Plums. Grapes. Mango. Papaya. Pomegranate. Kiwi. Cherries. Vegetables Lettuce. Spinach. Leafy greens, including kale, chard, collard greens, and mustard greens. Beets. Cauliflower. Cabbage. Broccoli. Carrots. Green beans. Tomatoes. Peppers. Onions. Cucumbers. Brussels sprouts. Grains Whole grains, such as whole-wheat or whole-grain bread, crackers, tortillas, cereal, and pasta. Unsweetened oatmeal. Quinoa. Brown or wild rice. Meats and other proteins Seafood. Poultry without skin. Lean cuts of poultry and beef. Tofu. Nuts. Seeds. Dairy Low-fat or fat-free dairy products such as milk, yogurt, and cheese. The items listed above may not be a complete list of foods and beverages you can eat. Contact a dietitian for more information. What foods should I avoid? Fruits Fruits canned with  syrup. Vegetables Canned vegetables. Frozen vegetables with butter or cream sauce. Grains Refined white flour and flour products such as bread, pasta, snack foods, and cereals. Avoid all processed foods. Meats and other proteins Fatty cuts of meat. Poultry with skin. Breaded or fried meats. Processed meat. Avoid saturated fats. Dairy Full-fat yogurt, cheese, or milk. Beverages Sweetened drinks, such as soda or iced tea. The items listed above may not be a complete list of foods and beverages you should avoid. Contact a dietitian for more information. Questions to ask a health care provider  Do I need to meet with a diabetes educator?  Do I need to meet with a dietitian?  What number can I call if I have questions?  When are the best times to check my blood glucose? Where to find more information:  American Diabetes Association: diabetes.org  Academy of Nutrition and Dietetics: www.eatright.org  National Institute of Diabetes and Digestive and Kidney Diseases: www.niddk.nih.gov  Association of Diabetes Care and Education Specialists: www.diabeteseducator.org Summary  It is important to have healthy eating   habits because your blood sugar (glucose) levels are greatly affected by what you eat and drink.  A healthy meal plan will help you control your blood glucose and maintain a healthy lifestyle.  Your health care provider may recommend that you work with a dietitian to make a meal plan that is best for you.  Keep in mind that carbohydrates (carbs) and alcohol have immediate effects on your blood glucose levels. It is important to count carbs and to use alcohol carefully. This information is not intended to replace advice given to you by your health care provider. Make sure you discuss any questions you have with your health care provider. Document Revised: 01/12/2019 Document Reviewed: 01/12/2019 Elsevier Patient Education  2021 Elsevier Inc.  

## 2020-04-27 NOTE — Progress Notes (Signed)
BP 120/76   Pulse 83   Temp 97.6 F (36.4 C) (Oral)   Wt 172 lb 3.2 oz (78.1 kg)   LMP  (LMP Unknown) Comment: age 70  SpO2 96%   BMI 28.41 kg/m    Subjective:    Patient ID: Cheryl Mendez, female    DOB: [birth year], 70 y.o.   MRN: [id number]  HPI: Cheryl Mendez is a 70 y.o. female  Chief Complaint  Patient presents with  . Diabetes    Patient states she is doing well with sugar levels and states at her last visit she was told to take 2 tablets, but she noticed a drop in her levels and states it scared her and so she started back taking 1 tablet and it has been fine.   DIABETES Last A1C in February 9.3%.  Continues on Metformin 1000 MG BID and Rybelsus --increased recent visit to 2 tablets 7 MG which made her feel shaky. Previously has been on Trulicity, which she reports caused mild GI issues. Glipizide has been taken in past.  Jardiance caused UTI frequently.  Her mother, who she cared for, passed in June 2021 and she reports she is slowly beginning to try taking care of herself.  Is working on diet changes, just started.    Is now in relationship and had sexual relations for first time in 15 years, which she reports she did not realize she needed.   Hypoglycemic episodes:no Polydipsia/polyuria:no Visual disturbance:no Chest pain:no Paresthesias:no Glucose Monitoring:no Accucheck frequency: a few times a week Fasting glucose: 150's, trending down from 200 range Post prandial: Evening: Before meals: Taking Insulin?:no Long acting insulin: Short acting insulin: Blood Pressure Monitoring:not checking Retinal Examination:Up to Date -- had performed at [place] to Date Pneumovax:Up to Date Influenza:Up to Date Aspirin:yes  Relevant past medical, surgical, family and social history reviewed and updated as indicated. Interim medical history since our last visit  reviewed. Allergies and medications reviewed and updated.  Review of Systems  Constitutional: Negative for activity change, appetite change, diaphoresis, fatigue and fever.  Respiratory: Negative for cough, chest tightness and shortness of breath.   Cardiovascular: Negative for chest pain, palpitations and leg swelling.  Gastrointestinal: Negative.   Endocrine: Negative for cold intolerance, heat intolerance, polydipsia, polyphagia and polyuria.  Neurological: Negative.   Psychiatric/Behavioral: Negative.    Per HPI unless specifically indicated above     Objective:    BP 120/76   Pulse 83   Temp 97.6 F (36.4 C) (Oral)   Wt 172 lb 3.2 oz (78.1 kg)   LMP  (LMP Unknown) Comment: age 25  SpO2 96%   BMI 28.41 kg/m   Wt Readings from Last 3 Encounters:  04/27/20 172 lb 3.2 oz (78.1 kg)  03/28/20 176 lb 9.6 oz (80.1 kg)  11/25/19 171 lb 12.8 oz (77.9 kg)    Physical Exam Vitals and nursing note reviewed.  Constitutional:      General: She is awake. She is not in acute distress.    Appearance: She is well-developed and overweight. She is not ill-appearing.  HENT:     Head: Normocephalic.     Right Ear: Hearing normal.     Left Ear: Hearing normal.  Eyes:     General: Lids are normal.        Right eye: No discharge.        Left eye: No discharge.     Conjunctiva/sclera: Conjunctivae normal.     Pupils: Pupils  are equal, round, and reactive to light.  Neck:     Thyroid: No thyromegaly.     Vascular: No carotid bruit or JVD.  Cardiovascular:     Rate and Rhythm: Normal rate and regular rhythm.     Heart sounds: Normal heart sounds. No murmur heard. No gallop.   Pulmonary:     Effort: Pulmonary effort is normal. No accessory muscle usage or respiratory distress.     Breath sounds: Normal breath sounds.  Abdominal:     General: Bowel sounds are normal.     Palpations: Abdomen is soft.  Musculoskeletal:     Cervical back: Normal range of motion and neck supple.      Right lower leg: No edema.     Left lower leg: No edema.  Lymphadenopathy:     Cervical: No cervical adenopathy.  Skin:    General: Skin is warm and dry.  Neurological:     Mental Status: She is alert and oriented to person, place, and time.     Cranial Nerves: Cranial nerves are intact.     Motor: Motor function is intact.     Coordination: Coordination is intact.     Gait: Gait is intact.     Deep Tendon Reflexes: Reflexes are normal and symmetric.     Reflex Scores:      Brachioradialis reflexes are 2+ on the right side and 2+ on the left side.      Patellar reflexes are 2+ on the right side and 2+ on the left side. Psychiatric:        Attention and Perception: Attention normal.        Mood and Affect: Mood normal.        Behavior: Behavior normal. Behavior is cooperative.        Thought Content: Thought content normal.        Judgment: Judgment normal.    Results for orders placed or performed in visit on 03/28/20  Bayer DCA Hb A1c Waived (STAT)  Result Value Ref Range   HB A1C (BAYER DCA - WAIVED) 9.3 (H) <7.0 %  Lipid Profile  Result Value Ref Range   Cholesterol, Total 167 100 - 199 mg/dL   Triglycerides 293 (H) 0 - 149 mg/dL   HDL 35 (L) >39 mg/dL   VLDL Cholesterol Cal 48 (H) 5 - 40 mg/dL   LDL Chol Calc (NIH) 84 0 - 99 mg/dL   Chol/HDL Ratio 4.8 (H) 0.0 - 4.4 ratio  Basic Metabolic Panel (BMET)  Result Value Ref Range   Glucose 194 (H) 65 - 99 mg/dL   BUN 14 8 - 27 mg/dL   Creatinine, Ser 0.69 0.57 - 1.00 mg/dL   GFR calc non Af Amer 89 >59 mL/min/1.73   GFR calc Af Amer 103 >59 mL/min/1.73   BUN/Creatinine Ratio 20 12 - 28   Sodium 142 134 - 144 mmol/L   Potassium 4.2 3.5 - 5.2 mmol/L   Chloride 96 96 - 106 mmol/L   CO2 26 20 - 29 mmol/L   Calcium 10.1 8.7 - 10.3 mg/dL  Microalbumin, Urine Waived  Result Value Ref Range   Microalb, Ur Waived 30 (H) 0 - 19 mg/L   Creatinine, Urine Waived 200 10 - 300 mg/dL   Microalb/Creat Ratio <30 <30 mg/g  TSH   Result Value Ref Range   TSH 3.720 0.450 - 4.500 uIU/mL  Vitamin B12  Result Value Ref Range   Vitamin B-12 485 232 - 1,245 pg/mL  Assessment & Plan:   Problem List Items Addressed This Visit      Endocrine   Poorly controlled type 2 diabetes mellitus ([place])    Chronic, ongoing with poor adherence at times to diet which we are working on.  A1C last visit trend up to 9.3%, recently started Rybelsus -- we trialed increase to 7 MG recently but she feels this dropped her too low and with one tablet BS is trending down.  Urine ALB 30 last visit, continue Lotensin.  Continue Metformin 1000 M BID and Rybelsus to 3 MG daily.  Continue diet changes at home.  Highly recommend she monitor BS daily at home and document daily with fasting BS goal of <130.  Will have return in May for follow-up, plan to further increase to 7 MG if tolerating Rybelsus and not at goal with A1c.  Due to age would prefer to avoid return to Glipizide and prefer to avoid insulin -- discussed with her at length.            Follow up plan: Return in about 2 months (around 07/05/2020) for T2DM, HTN/HLD, GERD, B12.

## 2020-04-27 NOTE — Assessment & Plan Note (Signed)
Chronic, ongoing with poor adherence at times to diet which we are working on.  A1C last visit trend up to 9.3%, recently started Rybelsus -- we trialed increase to 7 MG recently but she feels this dropped her too low and with one tablet BS is trending down.  Urine ALB 30 last visit, continue Lotensin.  Continue Metformin 1000 M BID and Rybelsus to 3 MG daily.  Continue diet changes at home.  Highly recommend she monitor BS daily at home and document daily with fasting BS goal of <130.  Will have return in May for follow-up, plan to further increase to 7 MG if tolerating Rybelsus and not at goal with A1c.  Due to age would prefer to avoid return to Glipizide and prefer to avoid insulin -- discussed with her at length.

## 2020-06-06 ENCOUNTER — Ambulatory Visit (INDEPENDENT_AMBULATORY_CARE_PROVIDER_SITE_OTHER): Payer: Medicare Other | Admitting: Nurse Practitioner

## 2020-06-06 ENCOUNTER — Encounter: Payer: Self-pay | Admitting: Nurse Practitioner

## 2020-06-06 DIAGNOSIS — E1165 Type 2 diabetes mellitus with hyperglycemia: Secondary | ICD-10-CM

## 2020-06-06 DIAGNOSIS — T7840XA Allergy, unspecified, initial encounter: Secondary | ICD-10-CM | POA: Diagnosis not present

## 2020-06-06 MED ORDER — EPINEPHRINE 0.3 MG/0.3ML IJ SOAJ: 0.3000 mg | INTRAMUSCULAR | 3 refills | Status: DC | PRN

## 2020-06-06 NOTE — Progress Notes (Signed)
LMP  (LMP Unknown) Comment: age 70   Subjective:    Patient ID: Cheryl Mendez, female    DOB: November 28, 1950, 70 y.o.   MRN: [id number]  HPI: Cheryl Mendez is a 70 y.o. female  Chief Complaint  Patient presents with  . Allergic Reaction    Patient states she had a small can of soup and states she threw it back up and had some diarrhea. Patient noticed that she had some tongue swelling at the back towards her throat. Patient states she feels better and denies having any fever and she took Claritin and it helped.  . Diabetes    Patient states her sugar reading was 170 today and she has not taken any medications due to her stomach feeling a little weird. Patient states she is feeling better.    . This visit was completed via telephone due to the restrictions of the COVID-19 pandemic. All issues as above were discussed and addressed but no physical exam was performed. If it was felt that the patient should be evaluated in the office, they were directed there. The patient verbally consented to this visit. Patient was unable to complete an audio/visual visit due to Lack of equipment. Due to the catastrophic nature of the COVID-19 pandemic, this visit was done through audio contact only. . Location of the patient: home . Location of the provider: home . Those involved with this call:  . Provider: Marnee Guarneri, DNP . CMA: Irena Reichmann, CMA . Front Desk/Registration: Jill Side  . Time spent on call: 21 minutes on the phone discussing health concerns. 15 minutes total spent in review of patient's record and preparation of their chart.  . I verified patient identity using two factors (patient name and date of birth). Patient consents verbally to being seen via telemedicine visit today.    ALLERGIC REACTION Had paramedics come to house yesterday, had swelling in the back of her tongue and mouth.  This was new for her, never had before.  She called them and started throwing up + had diarrhea  -- did not have fever per paramedics -- sugar and BP elevated, which they felt was related to her anxiety.  Does take Omeprazole daily.   Prior to episode ate can of chicken noodle soup, ate just a little bit.  She took Claritin, which helped.  Symptoms resolved after about one hour, paramedics stayed with her.  Overall feels 90% better today -- feels like she has a mild stomach bug.   Duration: acute Eye swelling, itching or discharge: no Post nasal drip: no Cough: none Allergist evaluation in past: yes Allergen injection immunotherapy: no ENT evaluation in past: no Known environmental allergy: yes Indoor pets: no History of asthma: no Current allergy medications: Claritin Treatments attempted: Claritin  DIABETES Last A1C in February 9.3%.  Continues on Metformin 1000 MG BID and Rybelsus 7 MG daily, we did trial an increase recent visit to 14 MG which made her feel shaky. Previously has been on Trulicity, which she reports caused mild GI issues. Glipizide has been taken in past, caused low sugars. Jardiance caused UTI frequently.  Her mother, who she cared for, passed in June 2021 and she reports she is slowly beginning to try taking care of herself.  Is working on diet changes and is in new relationship. Hypoglycemic episodes:no Polydipsia/polyuria:no Visual disturbance:no Chest pain:no Paresthesias:no Glucose Monitoring:no Accucheck frequency: a few times a week Fasting glucose: 170 this morning, trending down from previous 200's -- this  is prior to medications this morning, has been averaging 150's Post prandial: Evening: Before meals: Taking Insulin?:no Long acting insulin: Short acting insulin: Blood Pressure Monitoring:not checking Retinal Examination:Up to Date -- had performed at [place] to Date Pneumovax:Up to Date Influenza:Up to Date Aspirin:yes  Relevant past  medical, surgical, family and social history reviewed and updated as indicated. Interim medical history since our last visit reviewed. Allergies and medications reviewed and updated.  Review of Systems  Constitutional: Negative for activity change, appetite change, diaphoresis, fatigue and fever.  Respiratory: Negative for cough, chest tightness and shortness of breath.   Cardiovascular: Negative for chest pain, palpitations and leg swelling.  Gastrointestinal: Negative.   Endocrine: Negative for cold intolerance, heat intolerance, polydipsia, polyphagia and polyuria.  Neurological: Negative.   Psychiatric/Behavioral: Negative.    Per HPI unless specifically indicated above     Objective:    LMP  (LMP Unknown) Comment: age 40  Wt Readings from Last 3 Encounters:  04/27/20 172 lb 3.2 oz (78.1 kg)  03/28/20 176 lb 9.6 oz (80.1 kg)  11/25/19 171 lb 12.8 oz (77.9 kg)    Physical Exam   Unable to perform due to telephone visit only.  Results for orders placed or performed in visit on 03/28/20  Bayer DCA Hb A1c Waived (STAT)  Result Value Ref Range   HB A1C (BAYER DCA - WAIVED) 9.3 (H) <7.0 %  Lipid Profile  Result Value Ref Range   Cholesterol, Total 167 100 - 199 mg/dL   Triglycerides 293 (H) 0 - 149 mg/dL   HDL 35 (L) >39 mg/dL   VLDL Cholesterol Cal 48 (H) 5 - 40 mg/dL   LDL Chol Calc (NIH) 84 0 - 99 mg/dL   Chol/HDL Ratio 4.8 (H) 0.0 - 4.4 ratio  Basic Metabolic Panel (BMET)  Result Value Ref Range   Glucose 194 (H) 65 - 99 mg/dL   BUN 14 8 - 27 mg/dL   Creatinine, Ser 0.69 0.57 - 1.00 mg/dL   GFR calc non Af Amer 89 >59 mL/min/1.73   GFR calc Af Amer 103 >59 mL/min/1.73   BUN/Creatinine Ratio 20 12 - 28   Sodium 142 134 - 144 mmol/L   Potassium 4.2 3.5 - 5.2 mmol/L   Chloride 96 96 - 106 mmol/L   CO2 26 20 - 29 mmol/L   Calcium 10.1 8.7 - 10.3 mg/dL  Microalbumin, Urine Waived  Result Value Ref Range   Microalb, Ur Waived 30 (H) 0 - 19 mg/L   Creatinine, Urine  Waived 200 10 - 300 mg/dL   Microalb/Creat Ratio <30 <30 mg/g  TSH  Result Value Ref Range   TSH 3.720 0.450 - 4.500 uIU/mL  Vitamin B12  Result Value Ref Range   Vitamin B-12 485 232 - 1,245 pg/mL      Assessment & Plan:   Problem List Items Addressed This Visit      Endocrine   Poorly controlled type 2 diabetes mellitus ([place]) - Primary    Chronic, ongoing with poor adherence at times to diet which we are working on.  A1C last visit trend up to 9.3%, recently started Rybelsus -- we trialed increase to 14 MG recently but she feels this dropped her too low and with 7 MG BS is trending down.  Urine ALB 30 last visit, continue Lotensin.  Continue Metformin 1000 M BID and Rybelsus to 3 MG daily.  Continue diet changes at home.  Highly recommend she monitor BS daily at  home and document daily with fasting BS goal of <130.  Will have return in May for follow-up, plan to further increase to 14 MG if tolerating Rybelsus and not at goal with A1c.  Due to age would prefer to avoid return to Glipizide and prefer to avoid insulin -- discussed with her at length.          Other   Allergic reaction    Acute episode yesterday after eating chicken noodle soup, at this time has improved symptoms.  No further edema reported.  Recommend she continue Claritin daily at this time and avoid exposure to soup until further testing.  Referral to allergist to have new allergy testing, last was many years ago.  Epi pen sent in, for precaution if acute event presents again.  Currently is on Omeprazole, will not switch to Famotidine at this time.  Return to office as scheduled in May, sooner if further reactions present.  Immediately go to ER if severe symptoms present.      Relevant Orders   Ambulatory referral to Allergy     I discussed the assessment and treatment plan with the patient. The patient was provided an opportunity to ask questions and all were answered. The patient agreed with the plan and demonstrated  an understanding of the instructions.   The patient was advised to call back or seek an in-person evaluation if the symptoms worsen or if the condition fails to improve as anticipated.   I provided 21+ minutes of time during this encounter.   Follow up plan: Return for as scheduled in May for follow-up T2DM, HTN/HLD.

## 2020-06-06 NOTE — Assessment & Plan Note (Signed)
Acute episode yesterday after eating chicken noodle soup, at this time has improved symptoms.  No further edema reported.  Recommend she continue Claritin daily at this time and avoid exposure to soup until further testing.  Referral to allergist to have new allergy testing, last was many years ago.  Epi pen sent in, for precaution if acute event presents again.  Currently is on Omeprazole, will not switch to Famotidine at this time.  Return to office as scheduled in May, sooner if further reactions present.  Immediately go to ER if severe symptoms present.

## 2020-06-06 NOTE — Assessment & Plan Note (Signed)
Chronic, ongoing with poor adherence at times to diet which we are working on.  A1C last visit trend up to 9.3%, recently started Rybelsus -- we trialed increase to 14 MG recently but she feels this dropped her too low and with 7 MG BS is trending down.  Urine ALB 30 last visit, continue Lotensin.  Continue Metformin 1000 M BID and Rybelsus to 3 MG daily.  Continue diet changes at home.  Highly recommend she monitor BS daily at home and document daily with fasting BS goal of <130.  Will have return in May for follow-up, plan to further increase to 14 MG if tolerating Rybelsus and not at goal with A1c.  Due to age would prefer to avoid return to Glipizide and prefer to avoid insulin -- discussed with her at length.

## 2020-06-06 NOTE — Patient Instructions (Signed)
Anaphylactic Reaction, Adult An anaphylactic reaction (anaphylaxis) is a sudden, serious allergic reaction. This affects more than one part of your body. It can be life-threatening. If you have an anaphylactic reaction, you need to get medical help right away. What are the causes? This condition is caused by exposure to things that give you an allergic reaction (allergens). Common allergens include:  Foods, such as peanuts, wheat, shellfish, milk, and eggs.  Medicines.  Insect bites or stings.  Blood or parts of blood received for treatment (transfusions).  Chemicals, such as latex and dyes that are used in food and in medical tests. What are the signs or symptoms? Signs of an anaphylactic reaction may include:  Feeling warm in the face (flushed). Your face may turn red.  Itchy, red, swollen areas of skin (hives).  Swelling of the: ? Eyes. ? Lips. ? Face. ? Mouth. ? Tongue. ? Throat.  Trouble with any of these: ? Breathing. ? Talking. ? Swallowing.  Loud breathing (wheezing).  Feeling dizzy or light-headed.  Passing out (fainting).  Pain or cramps in your belly.  Throwing up (vomiting).  Watery poop (diarrhea). How is this diagnosed? This condition is diagnosed based on:  Your symptoms.  A physical exam.  Blood tests.  Recent exposure to things that give you an allergic reaction. How is this treated? If you think you are having an anaphylactic reaction, you should do this right away:  Give yourself a shot of medicine (epinephrine) using an auto-injector "pen." Your doctor will teach you how to use this pen.  Call for emergency help. If you use a pen, you must still get treated in the hospital. There, you may be given: ? Medicines. ? Oxygen. ? Fluids in an IV tube. Follow these instructions at home: Safety  Always keep an auto-injector pen with you. This could save your life. Use it as told by your doctor.  Do not drive after a reaction. Wait until  your doctor says it is safe to drive.  Make sure that you, the people who live with you, and your employer know: ? What you are allergic to, so you can stay away from it. ? How to use your auto-injector pen.  Wear a bracelet or necklace that says you have an allergy, if your doctor tells you to do this.  Learn the signs of a very bad allergic reaction. This way, you can treat it right away.  Work with your doctors to make a plan for what to do if you have a very bad reaction. It is important to be ready. If you use your auto-injector pen:  Get more medicine (epinephrine) for your pen right away. This is important in case you have another reaction.  Get help right away.   To avoid a serious allergic reaction:  Avoid things that gave you a very bad allergic reaction before.  Tell your server about your allergy when you go out to eat. If you are not sure if your meal has food that you are allergic to, ask your server before you eat it. General instructions  Take over-the-counter and prescription medicines only as told by your doctor.  If you have itchy, red, swollen areas of skin or a rash: ? Use an over-the-counter medicine (antihistamine) as told by your doctor. ? Put cold, wet cloths on your skin. ? Take a cool bath or shower. Avoid hot water.  Tell all doctors who care for you that you have an allergy.  Keep all follow-up  visits as told by your doctor. This is important. Get help right away if:  You have signs of an allergic reaction. You may notice them soon after being exposed to things that give you an allergic reaction. Signs may include: ? Warmth in your face. Your face may turn red. ? Itchy, red, swollen areas of skin. ? Swelling of your:  Eyes.  Lips.  Face.  Mouth.  Tongue.  Throat. ? Trouble with any of these:  Breathing.  Talking.  Swallowing. ? Loud breathing (wheezing). ? Feeling dizzy or light-headed. ? Passing out. ? Pain or cramps in your  belly. ? Throwing up. ? Watery poop.  You had to use your auto-injector pen. You must go to the emergency room even if the medicine seems to be working. This is because another allergic reaction may happen within 3 days (rebound anaphylaxis). These symptoms may be an emergency. Do not wait to see if the symptoms will go away. Do this right away:  Use your auto-injector pen as you have been told.  Get medical help. Call your local emergency services (911 in the [place] Do not drive yourself to the hospital. Summary  An anaphylactic reaction (anaphylaxis) is a sudden, serious allergic reaction.  This condition can be life-threatening. If you have a reaction, get medical help right away.  Your doctor will show you how to give yourself a shot (epinephrine injection) with an auto-injector "pen."  Always keep an auto-injector pen with you. It could save your life. Use it as told by your doctor.  If you had to use your auto-injector pen, you must go to the emergency room. Go there even if the medicine seems to be working. This information is not intended to replace advice given to you by your health care provider. Make sure you discuss any questions you have with your health care provider. Document Revised: 07/08/2018 Document Reviewed: 05/29/2017 Elsevier Patient Education  2021 [employer].

## 2020-06-28 ENCOUNTER — Encounter: Payer: Self-pay | Admitting: Nurse Practitioner

## 2020-06-28 ENCOUNTER — Telehealth (INDEPENDENT_AMBULATORY_CARE_PROVIDER_SITE_OTHER): Payer: Medicare Other | Admitting: Nurse Practitioner

## 2020-06-28 DIAGNOSIS — J069 Acute upper respiratory infection, unspecified: Secondary | ICD-10-CM | POA: Diagnosis not present

## 2020-06-28 MED ORDER — GUAIFENESIN-CODEINE 100-10 MG/5ML PO SOLN: 5.0000 mL | Freq: Four times a day (QID) | ORAL | 0 refills | Status: DC | PRN

## 2020-06-28 MED ORDER — AZITHROMYCIN 250 MG PO TABS: ORAL_TABLET | ORAL | 0 refills | Status: DC

## 2020-06-28 NOTE — Assessment & Plan Note (Signed)
Recommend pt get chest xray and COVID test. Pt declines both at this time.  Antibiotic sent due to patient's fevers. Zpak requested by patient.   Discussed side effects of cough medicine with codeine. Discussed with patient that if she is still having fevers 24 hours after starting antibiotic she needs to get the COVID test and chest xray. Discussed staying hydrated in setting of fever.  Discussed s/s to monitor for and when to seek higher level of care.

## 2020-06-28 NOTE — Progress Notes (Signed)
LMP  (LMP Unknown) Comment: age 70   Subjective:    Patient ID: Cheryl Mendez, female    DOB: 20-Mar-1950, 70 y.o.   MRN: [id number]  HPI: Cheryl Mendez is a 70 y.o. female  Chief Complaint  Patient presents with  . Fever    Achy, vomiting diarrhea, sinus congestion. Started on Sunday, fever comes and goes. Has not been COVID tested    UPPER RESPIRATORY TRACT INFECTION Worst symptom: Achy, vomiting, diarrhea, sinus congestion.  Symptoms started on Saturday.  Fever: yes using tylenol Cough: yes Shortness of breath: patient was SOB but states that has improved. Wheezing: resolved Chest pain: no Chest tightness: no Chest congestion: yes Nasal congestion: yes Runny nose: yes Post nasal drip: yes Sneezing: no Sore throat: yes Swollen glands: no Sinus pressure: yes Headache: no Face pain: no Toothache: no Ear pain: no bilateral Ear pressure: yes bilateral Eyes red/itching:no Eye drainage/crusting: no  Vomiting: no but having nausea Rash: no Fatigue: yes Sick contacts: no Strep contacts: no  Context: stable Recurrent sinusitis: no Relief with OTC cold/cough medications: yes  Treatments attempted: cough syrup   Relevant past medical, surgical, family and social history reviewed and updated as indicated. Interim medical history since our last visit reviewed. Allergies and medications reviewed and updated.  Review of Systems  Constitutional: Positive for fatigue and fever.  HENT: Positive for congestion, sneezing and sore throat. Negative for dental problem, ear pain, postnasal drip, rhinorrhea, sinus pressure and sinus pain.   Respiratory: Positive for cough. Negative for shortness of breath and wheezing.   Cardiovascular: Negative for chest pain.  Gastrointestinal: Negative for vomiting.  Skin: Negative for rash.  Neurological: Negative for headaches.    Per HPI unless specifically indicated above     Objective:    LMP  (LMP Unknown) Comment: age 30  Wt  Readings from Last 3 Encounters:  04/27/20 172 lb 3.2 oz (78.1 kg)  03/28/20 176 lb 9.6 oz (80.1 kg)  11/25/19 171 lb 12.8 oz (77.9 kg)    Physical Exam Vitals and nursing note reviewed.  Constitutional:      General: She is not in acute distress.    Appearance: She is not ill-appearing.  HENT:     Head: Normocephalic.     Right Ear: Hearing normal.     Left Ear: Hearing normal.     Nose: Nose normal.  Pulmonary:     Effort: Pulmonary effort is normal. No respiratory distress.  Neurological:     Mental Status: She is alert.  Psychiatric:        Mood and Affect: Mood normal.        Behavior: Behavior normal.        Thought Content: Thought content normal.        Judgment: Judgment normal.     Results for orders placed or performed in visit on 03/28/20  Bayer DCA Hb A1c Waived (STAT)  Result Value Ref Range   HB A1C (BAYER DCA - WAIVED) 9.3 (H) <7.0 %  Lipid Profile  Result Value Ref Range   Cholesterol, Total 167 100 - 199 mg/dL   Triglycerides 293 (H) 0 - 149 mg/dL   HDL 35 (L) >39 mg/dL   VLDL Cholesterol Cal 48 (H) 5 - 40 mg/dL   LDL Chol Calc (NIH) 84 0 - 99 mg/dL   Chol/HDL Ratio 4.8 (H) 0.0 - 4.4 ratio  Basic Metabolic Panel (BMET)  Result Value Ref Range   Glucose 194 (H) 65 -  99 mg/dL   BUN 14 8 - 27 mg/dL   Creatinine, Ser 0.69 0.57 - 1.00 mg/dL   GFR calc non Af Amer 89 >59 mL/min/1.73   GFR calc Af Amer 103 >59 mL/min/1.73   BUN/Creatinine Ratio 20 12 - 28   Sodium 142 134 - 144 mmol/L   Potassium 4.2 3.5 - 5.2 mmol/L   Chloride 96 96 - 106 mmol/L   CO2 26 20 - 29 mmol/L   Calcium 10.1 8.7 - 10.3 mg/dL  Microalbumin, Urine Waived  Result Value Ref Range   Microalb, Ur Waived 30 (H) 0 - 19 mg/L   Creatinine, Urine Waived 200 10 - 300 mg/dL   Microalb/Creat Ratio <30 <30 mg/g  TSH  Result Value Ref Range   TSH 3.720 0.450 - 4.500 uIU/mL  Vitamin B12  Result Value Ref Range   Vitamin B-12 485 232 - 1,245 pg/mL      Assessment & Plan:   Problem  List Items Addressed This Visit      Respiratory   Upper respiratory tract infection - Primary    Recommend pt get chest xray and COVID test. Pt declines both at this time.  Antibiotic sent due to patient's fevers. Zpak requested by patient.   Discussed side effects of cough medicine with codeine. Discussed with patient that if she is still having fevers 24 hours after starting antibiotic she needs to get the COVID test and chest xray. Discussed staying hydrated in setting of fever.  Discussed s/s to monitor for and when to seek higher level of care.       Relevant Medications   azithromycin (ZITHROMAX) 250 MG tablet       Follow up plan: Return if symptoms worsen or fail to improve.    This visit was completed via MyChart due to the restrictions of the COVID-19 pandemic. All issues as above were discussed and addressed. Physical exam was done as above through visual confirmation on MyChart. If it was felt that the patient should be evaluated in the office, they were directed there. The patient verbally consented to this visit. 1. Location of the patient: Home 2. Location of the provider: Office 3. Those involved with this call:  ? Provider: Jon Billings, NP ? CMA: Tiffany Reel, CMA ? Front Desk/Registration: [facility] 4. Time spent on call: 15 minutes with patient face to face via video conference. More than 50% of this time was spent in counseling and coordination of care. 20 minutes total spent in review of patient's record and preparation of their chart.

## 2020-07-03 ENCOUNTER — Telehealth: Payer: Self-pay

## 2020-07-03 NOTE — Telephone Encounter (Signed)
Called and spoke to patient. She states that she has not had fevers.  States she has also not been able to get a chest xray or the covid test due to not having a car right now. She states that she is still having a lot of congestion. Has been taking the cough syrup, flonase, and the claritin.

## 2020-07-03 NOTE — Telephone Encounter (Signed)
Are symptoms improving at all?  Did she complete the antibiotic?  It can take 2-3 weeks for these viruses to resolve.

## 2020-07-03 NOTE — Telephone Encounter (Signed)
Called and spoke to patient. Advised patient of Karen's message. Advised patient that she should contact us if symptoms start to worsen. Patient verbalized understanding.

## 2020-07-03 NOTE — Telephone Encounter (Signed)
Please advise seen 5/11  Copied from [place] [id number]. Topic: General - Other >> Jul 03, 2020 10:51 AM Leward Quan A wrote: Reason for CRM: Patient called in to inform [facility] say that she is still having cough and congestion needed to know if she can do another virtual visit or just go ahead and cancel for a later date. Please call patient with an answer

## 2020-07-03 NOTE — Telephone Encounter (Signed)
Is patient continuing to have fevers?  During our visit last week, I recommended she get a chest xray and covid test.

## 2020-07-04 ENCOUNTER — Ambulatory Visit (INDEPENDENT_AMBULATORY_CARE_PROVIDER_SITE_OTHER): Payer: Medicare Other | Admitting: Nurse Practitioner

## 2020-07-04 ENCOUNTER — Ambulatory Visit: Payer: Medicare Other | Admitting: Nurse Practitioner

## 2020-07-04 ENCOUNTER — Other Ambulatory Visit: Payer: Self-pay

## 2020-07-04 ENCOUNTER — Ambulatory Visit
Admission: RE | Admit: 2020-07-04 | Discharge: 2020-07-04 | Disposition: A | Discharge status: Home / Self Care | Payer: Medicare Other | Source: Ambulatory Visit | Attending: Nurse Practitioner | Admitting: Nurse Practitioner

## 2020-07-04 ENCOUNTER — Ambulatory Visit
Admission: RE | Admit: 2020-07-04 | Discharge: 2020-07-04 | Disposition: A | Discharge status: Home / Self Care | Payer: Medicare Other | Attending: Nurse Practitioner | Admitting: Nurse Practitioner

## 2020-07-04 ENCOUNTER — Encounter: Payer: Self-pay | Admitting: Nurse Practitioner

## 2020-07-04 DIAGNOSIS — R059 Cough, unspecified: Secondary | ICD-10-CM

## 2020-07-04 DIAGNOSIS — E1159 Type 2 diabetes mellitus with other circulatory complications: Secondary | ICD-10-CM | POA: Diagnosis not present

## 2020-07-04 DIAGNOSIS — I152 Hypertension secondary to endocrine disorders: Secondary | ICD-10-CM | POA: Diagnosis not present

## 2020-07-04 DIAGNOSIS — E1169 Type 2 diabetes mellitus with other specified complication: Secondary | ICD-10-CM | POA: Diagnosis not present

## 2020-07-04 DIAGNOSIS — E538 Deficiency of other specified B group vitamins: Secondary | ICD-10-CM

## 2020-07-04 DIAGNOSIS — E1165 Type 2 diabetes mellitus with hyperglycemia: Secondary | ICD-10-CM | POA: Diagnosis not present

## 2020-07-04 DIAGNOSIS — E785 Hyperlipidemia, unspecified: Secondary | ICD-10-CM

## 2020-07-04 MED ORDER — BENAZEPRIL-HYDROCHLOROTHIAZIDE 20-25 MG PO TABS: 1.0000 | ORAL_TABLET | Freq: Every day | ORAL | 4 refills | Status: DC

## 2020-07-04 MED ORDER — MONTELUKAST SODIUM 10 MG PO TABS: 10.0000 mg | ORAL_TABLET | Freq: Every day | ORAL | 4 refills | Status: DC

## 2020-07-04 MED ORDER — ATORVASTATIN CALCIUM 10 MG PO TABS: ORAL_TABLET | ORAL | 4 refills | Status: DC

## 2020-07-04 NOTE — Assessment & Plan Note (Signed)
Acute, ongoing.  Unclear etiology -- suspect Covid as sister recently had Covid.  Patient continues to refuse testing and has not done home testing.  Finished course of antibiotics yesterday (Azithromycin) and feeling some better.  Recommend she obtain chest imaging which she agrees to, if PNA present will treat accordingly.  Discussed length of viral symptoms with cough lasting up to ~6 weeks.  Continue diabetic tussin, plenty of hydration, and supportive care.  Follow-up in office in 3 weeks, sooner if worsening.

## 2020-07-04 NOTE — Patient Instructions (Signed)

## 2020-07-04 NOTE — Assessment & Plan Note (Signed)
Chronic, ongoing with poor adherence at times to diet which we are working on.  A1C last visit trend up to 9.3%, recently started Rybelsus in [place] -- we trialed increase to 14 MG recently but she feels this dropped her too low and with 7 MG BS is trending down.  Urine ALB 30 last visit, continue Lotensin.  Continue Metformin 1000 M BID and Rybelsus to 3 MG daily.  Continue diet changes at home.  Highly recommend she monitor BS daily at home and document daily with fasting BS goal of <130.  Will have return in 4 weeks for follow-up, plan to further increase to 14 MG if tolerating Rybelsus and not at goal with A1c.  Due to age would prefer to avoid return to Glipizide and prefer to avoid insulin -- discussed with her at length.

## 2020-07-04 NOTE — Progress Notes (Signed)
LMP  (LMP Unknown) Comment: age 70   Subjective:    Patient ID: Cheryl Mendez, female    DOB: Nov 28, 1950, 70 y.o.   MRN: [id number]  HPI: Cheryl Mendez is a 70 y.o. female  Chief Complaint  Patient presents with  . Chest Congestion    Patient states she has been having ongoing issue with chest congestion and states she is feeling a little better compared to when she had the flu. Patient states Flonase and cough medicine it helps with her to rest. Patient denies having any other symptoms and states her appetite is back, but she can't taste as good.   . Hyperlipidemia  . Hypertension  . Diabetes    Patient sugar reading yesterday was 175. Patient states it usually doesn't run that high, but she does have some cough medicine before checking.   . Vitamin B12    . This visit was completed via telephone due to the restrictions of the COVID-19 pandemic. All issues as above were discussed and addressed but no physical exam was performed. If it was felt that the patient should be evaluated in the office, they were directed there. The patient verbally consented to this visit. Patient was unable to complete an audio/visual visit due to Lack of equipment. Due to the catastrophic nature of the COVID-19 pandemic, this visit was done through audio contact only. . Location of the patient: home . Location of the provider: work . Those involved with this call:  . Provider: Marnee Guarneri, DNP . CMA: Irena Reichmann, CMA . Front Desk/Registration: Roe Rutherford  . Time spent on call: 21 minutes on the phone discussing health concerns. 15 minutes total spent in review of patient's record and preparation of their chart.  . I verified patient identity using two factors (patient name and date of birth). Patient consents verbally to being seen via telemedicine visit today.    DIABETES Last A1C in February 9.3%.  Continues on Metformin 1000 MG BID and Rybelsus 7 MG daily, did trial an increase recent visit  to 14 MG which made her feel shaky. Previously has been on Trulicity, which she reports caused mild GI issues. Glipizide has been taken in past, caused low sugars. Jardiance caused UTI frequently.  Her mother, who she cared for, passed in June 2021 and she reports she is slowly beginning to try taking care of herself.  Is working on diet changes and is in new relationship.  Recent B12 level was 485.   Hypoglycemic episodes:no Polydipsia/polyuria:no Visual disturbance:no Chest pain:no Paresthesias:no Glucose Monitoring:no Accucheck frequency: a few times a week Fasting glucose: 175 this morning (took cough medication), has been averaging 150's Post prandial: Evening: Before meals: Taking Insulin?:no Long acting insulin: Short acting insulin: Blood Pressure Monitoring:not checking Retinal Examination:Up to Date -- had performed at [facility] to Date Pneumovax:Up to Date Influenza:Up to Date Aspirin:yes  HYPERTENSION / HYPERLIPIDEMIA Currently taking Lotensin 20-25 and Atorvastatin 10 MG. Recent LDL 102 and TCHOL 198, AST/ALT 28/33 in May 2021. Satisfied with current treatment?yes Duration of hypertension:chronic BP monitoring frequency:not checking BP range:  BP medication side effects:no Duration of hyperlipidemia:chronic Cholesterol medication side effects:no Cholesterol supplements: none Medication compliance:good compliance Aspirin:yes Recent stressors:no Recurrent headaches:no Visual changes:no Palpitations:no Dyspnea:no Chest pain:no Lower extremity edema:no Dizzy/lightheaded:no The 10-year ASCVD risk score [name] Jr., et al., 2013) is: 21.1%   Values used to calculate the score:     Age: 61 years     Sex: Female  Is Non-Hispanic African American: No     Diabetic: Yes     Tobacco smoker: No     Systolic Blood Pressure: 341 mmHg     Is  BP treated: Yes     HDL Cholesterol: 35 mg/dL     Total Cholesterol: 167 mg/dL  COUGH Seen on 06/28/20 and treated with Zpack and cough medication -- she refused imaging or Covid testing at time, although these were recommended -- she reports she can not go at this time as has no vehicle to get to imaging.  She continues to cough quite a bit and is getting "stuff up".  Continues on Flonase, Claritin, Singulair for sinus issues and allergies.  Has struggled with this for a long while.   Duration: chronic Runny nose: yes "clear Nasal congestion: yes Nasal itching: no Sneezing: no Eye swelling, itching or discharge: no Post nasal drip: yes Cough: no Sinus pressure: no  Ear pain: none Ear pressure: none Fever: none Symptoms occur seasonally: no Symptoms occur perenially: yes Satisfied with current treatment: yes Allergist evaluation in past: no Allergen injection immunotherapy: no Recurrent sinus infections: occasional ENT evaluation in past: no Known environmental allergy: no Indoor pets: yes History of asthma: no Current allergy medications: as above Treatments attempted: multiple different medications  Relevant past medical, surgical, family and social history reviewed and updated as indicated. Interim medical history since our last visit reviewed. Allergies and medications reviewed and updated.  Review of Systems  Constitutional: Negative for activity change, appetite change, diaphoresis, fatigue and fever.  HENT: Negative.   Respiratory: Positive for cough. Negative for chest tightness, shortness of breath and wheezing.   Cardiovascular: Negative for chest pain, palpitations and leg swelling.  Gastrointestinal: Negative.   Endocrine: Negative for cold intolerance, heat intolerance, polydipsia, polyphagia and polyuria.  Neurological: Negative.   Psychiatric/Behavioral: Negative.    Per HPI unless specifically indicated above     Objective:    LMP  (LMP Unknown)  Comment: age 64  Wt Readings from Last 3 Encounters:  04/27/20 172 lb 3.2 oz (78.1 kg)  03/28/20 176 lb 9.6 oz (80.1 kg)  11/25/19 171 lb 12.8 oz (77.9 kg)    Physical Exam   Unable to obtain due to telephone visit only.  Results for orders placed or performed in visit on 03/28/20  Bayer DCA Hb A1c Waived (STAT)  Result Value Ref Range   HB A1C (BAYER DCA - WAIVED) 9.3 (H) <7.0 %  Lipid Profile  Result Value Ref Range   Cholesterol, Total 167 100 - 199 mg/dL   Triglycerides 293 (H) 0 - 149 mg/dL   HDL 35 (L) >39 mg/dL   VLDL Cholesterol Cal 48 (H) 5 - 40 mg/dL   LDL Chol Calc (NIH) 84 0 - 99 mg/dL   Chol/HDL Ratio 4.8 (H) 0.0 - 4.4 ratio  Basic Metabolic Panel (BMET)  Result Value Ref Range   Glucose 194 (H) 65 - 99 mg/dL   BUN 14 8 - 27 mg/dL   Creatinine, Ser 0.69 0.57 - 1.00 mg/dL   GFR calc non Af Amer 89 >59 mL/min/1.73   GFR calc Af Amer 103 >59 mL/min/1.73   BUN/Creatinine Ratio 20 12 - 28   Sodium 142 134 - 144 mmol/L   Potassium 4.2 3.5 - 5.2 mmol/L   Chloride 96 96 - 106 mmol/L   CO2 26 20 - 29 mmol/L   Calcium 10.1 8.7 - 10.3 mg/dL  Microalbumin, Urine Waived  Result Value Ref Range  Microalb, Ur Waived 30 (H) 0 - 19 mg/L   Creatinine, Urine Waived 200 10 - 300 mg/dL   Microalb/Creat Ratio <30 <30 mg/g  TSH  Result Value Ref Range   TSH 3.720 0.450 - 4.500 uIU/mL  Vitamin B12  Result Value Ref Range   Vitamin B-12 485 232 - 1,245 pg/mL      Assessment & Plan:   Problem List Items Addressed This Visit      Cardiovascular and Mediastinum   Hypertension associated with diabetes ([place])    Chronic, stable with BP below goal in office recent visits.  Continue current medication regimen and adjust as needed -- Lotensin offering kidney protection.  Recommend checking BP at home three mornings a week and documenting for provider review.  CMP in office next visit.  Return in 3 months.      Relevant Medications   atorvastatin (LIPITOR) 10 MG tablet    benazepril-hydrochlorthiazide (LOTENSIN HCT) 20-25 MG tablet     Endocrine   Hyperlipidemia associated with type 2 diabetes mellitus (HCC)    Chronic, ongoing.  Continue current medication regimen and adjust as needed.  Lipid panel next visit and may increase dose if elevations above goal, discussed with her.      Relevant Medications   atorvastatin (LIPITOR) 10 MG tablet   benazepril-hydrochlorthiazide (LOTENSIN HCT) 20-25 MG tablet   Poorly controlled type 2 diabetes mellitus ([place]) - Primary    Chronic, ongoing with poor adherence at times to diet which we are working on.  A1C last visit trend up to 9.3%, recently started Rybelsus in [place] -- we trialed increase to 14 MG recently but she feels this dropped her too low and with 7 MG BS is trending down.  Urine ALB 30 last visit, continue Lotensin.  Continue Metformin 1000 M BID and Rybelsus to 3 MG daily.  Continue diet changes at home.  Highly recommend she monitor BS daily at home and document daily with fasting BS goal of <130.  Will have return in 4 weeks for follow-up, plan to further increase to 14 MG if tolerating Rybelsus and not at goal with A1c.  Due to age would prefer to avoid return to Glipizide and prefer to avoid insulin -- discussed with her at length.        Relevant Medications   atorvastatin (LIPITOR) 10 MG tablet   benazepril-hydrochlorthiazide (LOTENSIN HCT) 20-25 MG tablet     Other   Cough    Acute, ongoing.  Unclear etiology -- suspect Covid as sister recently had Covid.  Patient continues to refuse testing and has not done home testing.  Finished course of antibiotics yesterday (Azithromycin) and feeling some better.  Recommend she obtain chest imaging which she agrees to, if PNA present will treat accordingly.  Discussed length of viral symptoms with cough lasting up to ~6 weeks.  Continue diabetic tussin, plenty of hydration, and supportive care.  Follow-up in office in 3 weeks, sooner if worsening.      Relevant  Orders   DG Chest 2 View   B12 deficiency    Noted low normal levels on labs in past, with mild decrease noted on monofilament exam and long term Metformin use.  Recommend she continue taking Vitamin B12 500 to 1000 MCG daily.  Recheck level next visit.  Explained to patient the benefit of annual B12 level testing while on daily Metformin.  Educated patient on the effect of Metformin on absorption of Vitamin B12 and symptoms of low B12 level,  such as neuropathic pain.  "Reduced absorption of vitamin B12 is a known adverse effect of long-term use of Metformin" and "routine B12 monitoring may be considered in patients with poor dietary intake or absorption" Pollie Meyer, D. J., 2019 via UpToDate).            I discussed the assessment and treatment plan with the patient. The patient was provided an opportunity to ask questions and all were answered. The patient agreed with the plan and demonstrated an understanding of the instructions.   The patient was advised to call back or seek an in-person evaluation if the symptoms worsen or if the condition fails to improve as anticipated.   I provided 21+ minutes of time during this encounter.  Follow up plan: Return in about 3 weeks (around 07/25/2020) for T2DM, HTN/HLD, GERD, B12, COUGH.

## 2020-07-04 NOTE — Assessment & Plan Note (Signed)
Chronic, stable with BP below goal in office recent visits.  Continue current medication regimen and adjust as needed -- Lotensin offering kidney protection.  Recommend checking BP at home three mornings a week and documenting for provider review.  CMP in office next visit.  Return in 3 months.

## 2020-07-04 NOTE — Assessment & Plan Note (Signed)
Noted low normal levels on labs in past, with mild decrease noted on monofilament exam and long term Metformin use.  Recommend she continue taking Vitamin B12 500 to 1000 MCG daily.  Recheck level next visit.  Explained to patient the benefit of annual B12 level testing while on daily Metformin.  Educated patient on the effect of Metformin on absorption of Vitamin B12 and symptoms of low B12 level, such as neuropathic pain.  "Reduced absorption of vitamin B12 is a known adverse effect of long-term use of Metformin" and "routine B12 monitoring may be considered in patients with poor dietary intake or absorption" Pollie Meyer, D. J., 2019 via UpToDate).

## 2020-07-04 NOTE — Assessment & Plan Note (Signed)
Chronic, ongoing.  Continue current medication regimen and adjust as needed.  Lipid panel next visit and may increase dose if elevations above goal, discussed with her.

## 2020-07-06 NOTE — Progress Notes (Signed)
Please let Cheryl Mendez know her chest x-ray returned showing no acute findings.  Great news!!

## 2020-08-01 ENCOUNTER — Ambulatory Visit: Payer: Medicare Other | Admitting: Nurse Practitioner

## 2020-08-09 ENCOUNTER — Other Ambulatory Visit: Payer: Self-pay | Admitting: Nurse Practitioner

## 2020-08-11 ENCOUNTER — Ambulatory Visit: Payer: Medicare Other | Admitting: Nurse Practitioner

## 2020-08-21 ENCOUNTER — Other Ambulatory Visit: Payer: Self-pay | Admitting: Nurse Practitioner

## 2020-10-31 ENCOUNTER — Ambulatory Visit (INDEPENDENT_AMBULATORY_CARE_PROVIDER_SITE_OTHER): Payer: Medicare Other

## 2020-10-31 DIAGNOSIS — Z0000 Encounter for general adult medical examination without abnormal findings: Secondary | ICD-10-CM

## 2020-10-31 NOTE — Progress Notes (Signed)
Subjective:   Cheryl Mendez is a 70 y.o. female who presents for Medicare Annual (Subsequent) preventive examination.  I connected with  Cheryl Mendez on 10/31/20 by an audio only telemedicine application and verified that I am speaking with the correct person using two identifiers.   I discussed the limitations, risks, security and privacy concerns of performing an evaluation and management service by telephone and the availability of in person appointments. I also discussed with the patient that there may be a patient responsible charge related to this service. The patient expressed understanding and verbally consented to this telephonic visit.  Location of Patient: home Location of Provider: office  List any persons and their role that are participating in the visit with the patient.    Review of Systems    Defer to PCP Cardiac Risk Factors include: diabetes mellitus     Objective:    There were no vitals filed for this visit. There is no height or weight on file to calculate BMI.  Advanced Directives 10/31/2020 06/25/2016 05/06/2016  Does Patient Have a Medical Advance Directive? Yes;No No No  Does patient want to make changes to medical advance directive? No - Patient declined - -  Would patient like information on creating a medical advance directive? No - Patient declined No - Patient declined Yes (MAU/Ambulatory/Procedural Areas - Information given)    Current Medications (verified) Outpatient Encounter Medications as of 10/31/2020  Medication Sig   aspirin 81 MG chewable tablet Chew 81 mg by mouth daily.   atorvastatin (LIPITOR) 10 MG tablet TAKE 1 TABLET(10 MG) BY MOUTH DAILY   benazepril-hydrochlorthiazide (LOTENSIN HCT) 20-25 MG tablet Take 1 tablet by mouth daily.   blood glucose meter kit and supplies KIT Dispense based on patient and insurance preference. Use up to four times daily as directed. (FOR ICD-9 250.00, 250.01).   EPINEPHrine 0.3 mg/0.3 mL IJ SOAJ  injection Inject 0.3 mg into the muscle as needed for anaphylaxis.   fluticasone (FLONASE) 50 MCG/ACT nasal spray Place 2 sprays into both nostrils daily.   guaiFENesin-codeine 100-10 MG/5ML syrup Take 5 mLs by mouth every 6 (six) hours as needed for cough.   loratadine (CLARITIN) 10 MG tablet TAKE 1 TABLET(10 MG) BY MOUTH DAILY   metFORMIN (GLUCOPHAGE) 1000 MG tablet TAKE 1 TABLET(1000 MG) BY MOUTH TWICE DAILY WITH A MEAL   montelukast (SINGULAIR) 10 MG tablet Take 1 tablet (10 mg total) by mouth at bedtime.   omeprazole (PRILOSEC) 20 MG capsule Take 1 capsule (20 mg total) by mouth daily.   ONETOUCH DELICA LANCETS FINE MISC USE UP TO FOUR TIMES DAILY AS DIRECTED   ONETOUCH ULTRA test strip TEST FOUR TIMES DAILY   Semaglutide (RYBELSUS) 7 MG TABS Take 1 tablet by mouth daily.   No facility-administered encounter medications on file as of 10/31/2020.    Allergies (verified) Penicillin g benzathine   History: Past Medical History:  Diagnosis Date   Allergy    Arthritis    Diabetes mellitus without complication (HCC)    GERD (gastroesophageal reflux disease)    Hyperlipidemia    Hypertension    Lumbago    Migraine    Past Surgical History:  Procedure Laterality Date   COLONOSCOPY WITH PROPOFOL N/A 06/25/2016   Procedure: COLONOSCOPY WITH PROPOFOL;  Surgeon: Wyline Mood, MD;  Location: [facility] ENDOSCOPY;  Service: Endoscopy;  Laterality: N/A;   ESOPHAGOGASTRODUODENOSCOPY (EGD) WITH PROPOFOL N/A 06/25/2016   Procedure: ESOPHAGOGASTRODUODENOSCOPY (EGD) WITH PROPOFOL;  Surgeon: Wyline Mood, MD;  Location: [facility] ENDOSCOPY;  Service: Endoscopy;  Laterality: N/A;   NO PAST SURGERIES     Family History  Problem Relation Age of Onset   Diabetes Mother    Heart disease Father    Hypertension Father    Eczema Sister    COPD Sister    Social History   Socioeconomic History   Marital status: Widowed    Spouse name: Not on file   Number of children: Not on file   Years of education: Not on  file   Highest education level: Not on file  Occupational History   Not on file  Tobacco Use   Smoking status: Never   Smokeless tobacco: Never  Vaping Use   Vaping Use: Never used  Substance and Sexual Activity   Alcohol use: No    Alcohol/week: 0.0 standard drinks   Drug use: No   Sexual activity: Never  Other Topics Concern   Not on file  Social History Narrative   Not on file   Social Determinants of Health   Financial Resource Strain: Low Risk    Difficulty of Paying Living Expenses: Not hard at all  Food Insecurity: No Food Insecurity   Worried About Charity fundraiser in the Last Year: Never true   [place] in the Last Year: Never true  Transportation Needs: No Transportation Needs   Lack of Transportation (Medical): No   Lack of Transportation (Non-Medical): No  Physical Activity: Insufficiently Active   Days of Exercise per Week: 2 days   Minutes of Exercise per Session: 30 min  Stress: No Stress Concern Present   Feeling of Stress : Not at all  Social Connections: Moderately Integrated   Frequency of Communication with Friends and Family: More than three times a week   Frequency of Social Gatherings with Friends and Family: Once a week   Attends Religious Services: More than 4 times per year   Active Member of [employer] or Organizations: Yes   Attends Archivist Meetings: 1 to 4 times per year   Marital Status: Widowed    Tobacco Counseling Counseling given: Not Answered   Clinical Intake:  Pre-visit preparation completed: Yes  Pain : No/denies pain     Nutritional Risks: None Diabetes: Yes  How often do you need to have someone help you when you read instructions, pamphlets, or other written materials from your doctor or pharmacy?: 1 - Never What is the last grade level you completed in school?: 12th grade, some college courses  Diabetic?Yes  Interpreter Needed?: No      Activities of Daily Living In your present state of  health, do you have any difficulty performing the following activities: 10/31/2020 06/28/2020  Hearing? N N  Vision? N N  Difficulty concentrating or making decisions? N N  Walking or climbing stairs? N N  Dressing or bathing? N N  Doing errands, shopping? N N  Preparing Food and eating ? N -  Using the Toilet? N -  In the past six months, have you accidently leaked urine? N -  Managing your Medications? N -  Managing your Finances? N -  Housekeeping or managing your Housekeeping? N -  Some recent data might be hidden    Patient Care Team: Venita Lick, NP as PCP - General (Nurse Practitioner) Vladimir Faster, [facility] (Pharmacist)  Indicate any recent Medical Services you may have received from other than [facility] providers in the past year (date may be approximate).  Assessment:   This is a routine wellness examination for Cheryl Mendez.  Hearing/Vision screen No results found.  Dietary issues and exercise activities discussed: Current Exercise Habits: Home exercise routine, Type of exercise: walking, Time (Minutes): 30, Frequency (Times/Week): 3, Weekly Exercise (Minutes/Week): 90, Intensity: Mild, Exercise limited by: None identified   Goals Addressed   None    Depression Screen PHQ 2/9 Scores 10/31/2020 10/31/2020 03/28/2020 02/29/2020 06/28/2019 05/07/2017 05/06/2016  PHQ - 2 Score 0 0 0 0 0 1 0  PHQ- 9 Score - - 0 0 - - 2    Fall Risk Fall Risk  10/31/2020 03/28/2020 02/29/2020 03/29/2019 07/01/2017  Falls in the past year? 0 0 0 0 No  Number falls in past yr: 0 - - 0 -  Injury with Fall? 0 - - 0 -  Risk for fall due to : History of fall(s);No Fall Risks - - - -  Follow up Falls evaluation completed - - Falls evaluation completed -    FALL RISK PREVENTION PERTAINING TO THE HOME:  Any stairs in or around the home? Yes  If so, are there any without handrails? Yes  Home free of loose throw rugs in walkways, pet beds, electrical cords, etc? Yes  Adequate lighting in your home to reduce  risk of falls? Yes   ASSISTIVE DEVICES UTILIZED TO PREVENT FALLS:  Life alert? No  Use of a cane, walker or w/c? No  Grab bars in the bathroom? Yes Shower chair or bench in shower? No  Elevated toilet seat or a handicapped toilet? No   TIMED UP AND GO:  Was the test performed?  N/A .  Length of time to ambulate 10 feet: N/A sec.     Cognitive Function:     6CIT Screen 10/31/2020  What Year? 0 points  What month? 0 points  What time? 0 points  Count back from 20 0 points  Months in reverse 0 points  Repeat phrase 10 points  Total Score 10    Immunizations Immunization History  Administered Date(s) Administered   Fluad Quad(high Dose 65+) 03/29/2019, 11/25/2019   Influenza, High Dose Seasonal PF 01/19/2016, 11/08/2016, 11/07/2017   Influenza-Unspecified 11/07/2014   Moderna Sars-Covid-2 Vaccination 04/20/2020   Pneumococcal Conjugate-13 10/17/2015   Pneumococcal Polysaccharide-23 09/03/2011, 05/07/2017   Tdap 05/08/2010   Zoster, Live 03/25/2014    TDAP status: Due, Education has been provided regarding the importance of this vaccine. Advised may receive this vaccine at local pharmacy or Health Dept. Aware to provide a copy of the vaccination record if obtained from local pharmacy or Health Dept. Verbalized acceptance and understanding.  Flu Vaccine status: Due, Education has been provided regarding the importance of this vaccine. Advised may receive this vaccine at local pharmacy or Health Dept. Aware to provide a copy of the vaccination record if obtained from local pharmacy or Health Dept. Verbalized acceptance and understanding.  Pneumococcal vaccine status: Up to date  Covid-19 vaccine status: Information provided on how to obtain vaccines.   Qualifies for Shingles Vaccine? Yes   Zostavax completed Yes   Shingrix Completed?: No.    Education has been provided regarding the importance of this vaccine. Patient has been advised to call insurance company to  determine out of pocket expense if they have not yet received this vaccine. Advised may also receive vaccine at local pharmacy or Health Dept. Verbalized acceptance and understanding.  Screening Tests Health Maintenance  Topic Date Due   Zoster Vaccines- Shingrix (1 of 2) Never done  OPHTHALMOLOGY EXAM  11/04/2015   TETANUS/TDAP  05/07/2020   COVID-19 Vaccine (2 - Moderna series) 05/18/2020   INFLUENZA VACCINE  09/18/2020   HEMOGLOBIN A1C  09/25/2020   MAMMOGRAM  02/28/2021 (Originally 05/27/2011)   DEXA SCAN  02/28/2021 (Originally 06/01/2015)   COLONOSCOPY (Pts 45-89yrs Insurance coverage will need to be confirmed)  02/28/2021 (Originally 06/26/2019)   FOOT EXAM  03/28/2021   PNA vac Low Risk Adult  Completed   Hepatitis C Screening  Addressed   HPV VACCINES  Aged Out    Health Maintenance  Health Maintenance Due  Topic Date Due   Zoster Vaccines- Shingrix (1 of 2) Never done   OPHTHALMOLOGY EXAM  11/04/2015   TETANUS/TDAP  05/07/2020   COVID-19 Vaccine (2 - Moderna series) 05/18/2020   INFLUENZA VACCINE  09/18/2020   HEMOGLOBIN A1C  09/25/2020    Colorectal cancer screening: Type of screening: Colonoscopy. Completed 06/25/16. Repeat every 3 years  Mammogram status: Completed 05/26/09. Repeat every year   Lung Cancer Screening: (Low Dose CT Chest recommended if Age 70-80 years, 30 pack-year currently smoking OR have quit w/in 15years.) does qualify.   Lung Cancer Screening Referral: N/A  Additional Screening:  Hepatitis C Screening: does qualify; Completed 02/2005  Vision Screening: Recommended annual ophthalmology exams for early detection of glaucoma and other disorders of the eye. Is the patient up to date with their annual eye exam?  Yes  Who is the provider or what is the name of the office in which the patient attends annual eye exams? Dr. Gloriann Loan If pt is not established with a provider, would they like to be referred to a provider to establish care?  N/A .   Dental  Screening: Recommended annual dental exams for proper oral hygiene  Community Resource Referral / Chronic Care Management: CRR required this visit?  No   CCM required this visit?  No      Plan:     I have personally reviewed and noted the following in the patient's chart:   Medical and social history Use of alcohol, tobacco or illicit drugs  Current medications and supplements including opioid prescriptions.  Functional ability and status Nutritional status Physical activity Advanced directives List of other physicians Hospitalizations, surgeries, and ER visits in previous 12 months Vitals Screenings to include cognitive, depression, and falls Referrals and appointments  In addition, I have reviewed and discussed with patient certain preventive protocols, quality metrics, and best practice recommendations. A written personalized care plan for preventive services as well as general preventive health recommendations were provided to patient.   Cheryl Mendez , Thank you for taking time to come for your Medicare Wellness Visit. I appreciate your ongoing commitment to your health goals. Please review the following plan we discussed and let me know if I can assist you in the future.   These are the goals we discussed:  Goals       "I need to work on my diabetes" (pt-stated)      Current Barriers:  Knowledge Deficits related to management of T2DM;  most recent A1c 11.2% Reports hx tx with Invokana, but "made my toes feel strange" and was concerned about amputation risk, used Trulicity with success and ~10 lb weight loss, but "my stomach stayed upset". Only took for ~1 month Has not been checking blood sugars. Previously had a One Touch Ultra meter, but insurance required switch to One Touch Ultra 2, and she and her sister have been unable to figure out how to  use; keep getting error messages. Have not tried new batteries, have not taken back to the pharmacy for help Confirms polyuria,  polydipsia, nocturia significantly increased overt the past few months States that she was told by GI that metformin was causing stomach cramps and slowed digestion  Pharmacist Clinical Goal(s):  Over the next 90 days, patient will work with PharmD and primary care provider to address needs related to improved diabetes control  Interventions: Comprehensive medication review performed. Discussed that Jardiance + metformin may not get patient to goal A1c <7%. She noted she did not want to be on three medications for diabetes, but wants [place] to continue to monitor and see how her blood sugars respond to Jardiance addition Patient will take glucometer to [place] tomorrow to ask the pharmacist to help her troubleshoot the error messages, and/or replace with a different meter. If they are unwilling to help, she will bring the meter to clinic and I can help  Patient Self Care Activities:  Currently UNABLE TO independently use glucometer  Initial goal documentation         This is a list of the screening recommended for you and due dates:  Health Maintenance  Topic Date Due   Zoster (Shingles) Vaccine (1 of 2) Never done   Eye exam for diabetics  11/04/2015   Tetanus Vaccine  05/07/2020   Flu Shot  09/18/2020   Hemoglobin A1C  09/25/2020   COVID-19 Vaccine (3 - Booster for Moderna series) 10/22/2020   Mammogram  02/28/2021*   DEXA scan (bone density measurement)  02/28/2021*   Colon Cancer Screening  02/28/2021*   Complete foot exam   03/28/2021   Pneumonia vaccines  Completed   Hepatitis C Screening: USPSTF Recommendation to screen - Ages 11-79 yo.  Addressed   HPV Vaccine  Aged Out  *Topic was postponed. The date shown is not the original due date.        Cheryl Mendez, [place]   10/31/2020   Nurse Notes: Non Face to Face 60 minutes

## 2020-10-31 NOTE — Patient Instructions (Signed)
Health Maintenance, Female Adopting a healthy lifestyle and getting preventive care are important in promoting health and wellness. Ask your health care provider about: The right schedule for you to have regular tests and exams. Things you can do on your own to prevent diseases and keep yourself healthy. What should I know about diet, weight, and exercise? Eat a healthy diet  Eat a diet that includes plenty of vegetables, fruits, low-fat dairy products, and lean protein. Do not eat a lot of foods that are high in solid fats, added sugars, or sodium. Maintain a healthy weight Body mass index (BMI) is used to identify weight problems. It estimates body fat based on height and weight. Your health care provider can help determine your BMI and help you achieve or maintain a healthy weight. Get regular exercise Get regular exercise. This is one of the most important things you can do for your health. Most adults should: Exercise for at least 150 minutes each week. The exercise should increase your heart rate and make you sweat (moderate-intensity exercise). Do strengthening exercises at least twice a week. This is in addition to the moderate-intensity exercise. Spend less time sitting. Even light physical activity can be beneficial. Watch cholesterol and blood lipids Have your blood tested for lipids and cholesterol at 70 years of age, then have this test every 5 years. Have your cholesterol levels checked more often if: Your lipid or cholesterol levels are high. You are older than 70 years of age. You are at high risk for heart disease. What should I know about cancer screening? Depending on your health history and family history, you may need to have cancer screening at various ages. This may include screening for: Breast cancer. Cervical cancer. Colorectal cancer. Skin cancer. Lung cancer. What should I know about heart disease, diabetes, and high blood pressure? Blood pressure and heart  disease High blood pressure causes heart disease and increases the risk of stroke. This is more likely to develop in people who have high blood pressure readings, are of African descent, or are overweight. Have your blood pressure checked: Every 3-5 years if you are 18-39 years of age. Every year if you are 40 years old or older. Diabetes Have regular diabetes screenings. This checks your fasting blood sugar level. Have the screening done: Once every three years after age 40 if you are at a normal weight and have a low risk for diabetes. More often and at a younger age if you are overweight or have a high risk for diabetes. What should I know about preventing infection? Hepatitis B If you have a higher risk for hepatitis B, you should be screened for this virus. Talk with your health care provider to find out if you are at risk for hepatitis B infection. Hepatitis C Testing is recommended for: Everyone born from 1945 through 1965. Anyone with known risk factors for hepatitis C. Sexually transmitted infections (STIs) Get screened for STIs, including gonorrhea and chlamydia, if: You are sexually active and are younger than 70 years of age. You are older than 70 years of age and your health care provider tells you that you are at risk for this type of infection. Your sexual activity has changed since you were last screened, and you are at increased risk for chlamydia or gonorrhea. Ask your health care provider if you are at risk. Ask your health care provider about whether you are at high risk for HIV. Your health care provider may recommend a prescription medicine   to help prevent HIV infection. If you choose to take medicine to prevent HIV, you should first get tested for HIV. You should then be tested every 3 months for as long as you are taking the medicine. Pregnancy If you are about to stop having your period (premenopausal) and you may become pregnant, seek counseling before you get  pregnant. Take 400 to 800 micrograms (mcg) of folic acid every day if you become pregnant. Ask for birth control (contraception) if you want to prevent pregnancy. Osteoporosis and menopause Osteoporosis is a disease in which the bones lose minerals and strength with aging. This can result in bone fractures. If you are 65 years old or older, or if you are at risk for osteoporosis and fractures, ask your health care provider if you should: Be screened for bone loss. Take a calcium or vitamin D supplement to lower your risk of fractures. Be given hormone replacement therapy (HRT) to treat symptoms of menopause. Follow these instructions at home: Lifestyle Do not use any products that contain nicotine or tobacco, such as cigarettes, e-cigarettes, and chewing tobacco. If you need help quitting, ask your health care provider. Do not use street drugs. Do not share needles. Ask your health care provider for help if you need support or information about quitting drugs. Alcohol use Do not drink alcohol if: Your health care provider tells you not to drink. You are pregnant, may be pregnant, or are planning to become pregnant. If you drink alcohol: Limit how much you use to 0-1 drink a day. Limit intake if you are breastfeeding. Be aware of how much alcohol is in your drink. In the [place] one drink equals one 12 oz bottle of beer (355 mL), one 5 oz glass of wine (148 mL), or one 1 oz glass of hard liquor (44 mL). General instructions Schedule regular health, dental, and eye exams. Stay current with your vaccines. Tell your health care provider if: You often feel depressed. You have ever been abused or do not feel safe at home. Summary Adopting a healthy lifestyle and getting preventive care are important in promoting health and wellness. Follow your health care provider's instructions about healthy diet, exercising, and getting tested or screened for diseases. Follow your health care provider's  instructions on monitoring your cholesterol and blood pressure. This information is not intended to replace advice given to you by your health care provider. Make sure you discuss any questions you have with your health care provider. Document Revised: 04/14/2020 Document Reviewed: 01/28/2018 Elsevier Patient Education  2022 Elsevier Inc.  

## 2020-11-01 ENCOUNTER — Encounter: Payer: Self-pay | Admitting: Nurse Practitioner

## 2020-11-01 ENCOUNTER — Ambulatory Visit (INDEPENDENT_AMBULATORY_CARE_PROVIDER_SITE_OTHER): Payer: Medicare Other | Admitting: Nurse Practitioner

## 2020-11-01 ENCOUNTER — Other Ambulatory Visit: Payer: Self-pay

## 2020-11-01 VITALS — BP 127/79 | HR 94 | Temp 98.8°F | Wt 173.2 lb

## 2020-11-01 DIAGNOSIS — E1169 Type 2 diabetes mellitus with other specified complication: Secondary | ICD-10-CM

## 2020-11-01 DIAGNOSIS — E1159 Type 2 diabetes mellitus with other circulatory complications: Secondary | ICD-10-CM

## 2020-11-01 DIAGNOSIS — I152 Hypertension secondary to endocrine disorders: Secondary | ICD-10-CM

## 2020-11-01 DIAGNOSIS — E1165 Type 2 diabetes mellitus with hyperglycemia: Secondary | ICD-10-CM | POA: Diagnosis not present

## 2020-11-01 DIAGNOSIS — R748 Abnormal levels of other serum enzymes: Secondary | ICD-10-CM | POA: Diagnosis not present

## 2020-11-01 DIAGNOSIS — E785 Hyperlipidemia, unspecified: Secondary | ICD-10-CM

## 2020-11-01 DIAGNOSIS — K219 Gastro-esophageal reflux disease without esophagitis: Secondary | ICD-10-CM

## 2020-11-01 DIAGNOSIS — R059 Cough, unspecified: Secondary | ICD-10-CM

## 2020-11-01 DIAGNOSIS — E538 Deficiency of other specified B group vitamins: Secondary | ICD-10-CM | POA: Diagnosis not present

## 2020-11-01 LAB — BAYER DCA HB A1C WAIVED: HB A1C (BAYER DCA - WAIVED): 7.7 % — ABNORMAL HIGH (ref 4.8–5.6)

## 2020-11-01 MED ORDER — GUAIFENESIN-CODEINE 100-10 MG/5ML PO SOLN: 5.0000 mL | Freq: Four times a day (QID) | ORAL | 0 refills | Status: DC | PRN

## 2020-11-01 MED ORDER — AZITHROMYCIN 250 MG PO TABS: ORAL_TABLET | ORAL | 0 refills | Status: AC

## 2020-11-01 MED ORDER — MONTELUKAST SODIUM 10 MG PO TABS: 10.0000 mg | ORAL_TABLET | Freq: Every day | ORAL | 4 refills | Status: DC

## 2020-11-01 NOTE — Assessment & Plan Note (Signed)
Acute x 2 weeks, negative Covid at home.  At this time obtain CXR and start Azithromycin daily for 6 days.  Refill on cough medication sent for rest at night.  Overall no distress and lung sounds stable today.  If ongoing episodes of cough, in future may need to refer to ENT for further allergy work-up and take off ACE.

## 2020-11-01 NOTE — Patient Instructions (Signed)

## 2020-11-01 NOTE — Assessment & Plan Note (Signed)
Chronic, ongoing with poor adherence at times to diet which we are working on.  A1C last visit trend up to 9.3%, and today is coming down to 7.7%.   Rybelsus started in February 2022 -- we trialed increase to 14 MG but she feels this dropped her too low.  Urine ALB 30 last visit, continue Lotensin.  Continue Metformin 1000 M BID and Rybelsus 7 MG daily.  Continue diet changes at home.  Highly recommend she monitor BS daily at home and document daily with fasting BS goal of <130.  Will have return in 3 month for follow-up, plan to further increase to 14 MG if tolerating Rybelsus and not at goal with A1c.  Due to age would prefer to avoid return to Glipizide and prefer to avoid insulin -- discussed with her at length.

## 2020-11-01 NOTE — Assessment & Plan Note (Signed)
Chronic, stable with BP below goal in office today.  Continue current medication regimen and adjust as needed -- Lotensin offering kidney protection.  Recommend checking BP at home three mornings a week and documenting for provider review.  CMP today.  Return in 3 months.

## 2020-11-01 NOTE — Assessment & Plan Note (Signed)
Noted low normal levels on labs in past.  Recommend she continue taking Vitamin B12 500 to 1000 MCG daily.  Recheck level next visit.  Explained to patient the benefit of annual B12 level testing while on daily Metformin.  Educated patient on the effect of Metformin on absorption of Vitamin B12 and symptoms of low B12 level, such as neuropathic pain.  "Reduced absorption of vitamin B12 is a known adverse effect of long-term use of Metformin" and "routine B12 monitoring may be considered in patients with poor dietary intake or absorption" Pollie Meyer, D. J., 2019 via UpToDate).

## 2020-11-01 NOTE — Assessment & Plan Note (Signed)
Chronic, ongoing.  Continue current medication regimen and adjust as needed. Lipid panel today. 

## 2020-11-01 NOTE — Progress Notes (Signed)
BP 127/79   Pulse 94 Comment: apical  Temp 98.8 F (37.1 C) (Oral)   Wt 173 lb 3.2 oz (78.6 kg)   LMP  (LMP Unknown) Comment: age 70  SpO2 98%   BMI 28.58 kg/m    Subjective:    Patient ID: CHRISTIAN TREADWAY, female    DOB: 1950-10-02, 70 y.o.   MRN: [id number]  HPI: AKELIA HUSTED is a 70 y.o. female  Chief Complaint  Patient presents with   Cough    Patient is here for a follow up on her persistent cough. Patient denies having a sore throat anymore. Patient states she thinks it is allergy related and states if it is then her current medication isn't working. Patient states she took a Covid test Monday and it was negative.   Nasal Congestion   DIABETES Last A1C in February 9.3%.  Continues on Metformin 1000 MG BID and Rybelsus 7 MG daily, did trial an increase recent visit to 14 MG which made her feel shaky. Took cough drops for awhile and sugars were up she reports.  Previously has been on Trulicity, which caused mild GI issues. Glipizide taken in past, caused low sugars. Jardiance caused UTI frequently. Her mother, who she cared for, passed in June 2021 and she has been trying to get back on top of health.  She missed follow-up labs in May.     Recent B12 level was 485 -- she is taking supplement at this time.   Hypoglycemic episodes:no Polydipsia/polyuria: no Visual disturbance: no Chest pain: no Paresthesias: no Glucose Monitoring: no             Accucheck frequency: a few times a week             Fasting glucose:              Post prandial:             Evening:             Before meals: Taking Insulin?: no             Long acting insulin:             Short acting insulin: Blood Pressure Monitoring: not checking Retinal Examination: Not Up to Date -- had performed at [facility] last year Foot Exam: Up to Date Pneumovax: Up to Date Influenza: Up to Date Aspirin: yes    HYPERTENSION / HYPERLIPIDEMIA Currently taking Lotensin 20-25 and Atorvastatin 10 MG. History of mild  elevations periodically on LDTs, denies alcohol use. Satisfied with current treatment? yes Duration of hypertension: chronic BP monitoring frequency: not checking BP range:  BP medication side effects: no Duration of hyperlipidemia: chronic Cholesterol medication side effects: no Cholesterol supplements: none Medication compliance: good compliance Aspirin: yes Recent stressors: no Recurrent headaches: no Visual changes: no Palpitations: no Dyspnea: no Chest pain: no Lower extremity edema: no Dizzy/lightheaded: no The 10-year ASCVD risk score (Arnett DK, et al., 2019) is: 23.3%   Values used to calculate the score:     Age: 80 years     Sex: Female     Is Non-Hispanic African American: No     Diabetic: Yes     Tobacco smoker: No     Systolic Blood Pressure: 888 mmHg     Is BP treated: Yes     HDL Cholesterol: 35 mg/dL     Total Cholesterol: 167 mg/dL    COUGH Complains today of ongoing cough and nasal congestion --  started two weeks ago.  [place]  Took Covid test and this was negative.    Last treated for cough 06/29/20, at time she refused Covid testing.  Has underlying allergic rhinitis -- sent in Singulair in May, but she never picked up.  Is taking Claritin daily.  Continues on Prilosec for GERD. Duration: chronic Runny nose: yes "clear Nasal congestion: yes Nasal itching: no Sneezing: no Eye swelling, itching or discharge: no Post nasal drip: yes Cough: no Sinus pressure: no  Ear pain: none Ear pressure: none Fever: none Symptoms occur seasonally: no Symptoms occur perenially: yes Satisfied with current treatment: yes Allergist evaluation in past: no Allergen injection immunotherapy: no Recurrent sinus infections: occasional ENT evaluation in past: no Known environmental allergy: no Indoor pets: yes History of asthma: no Current allergy medications: as above Treatments attempted: multiple different  medications  Relevant past medical, surgical, family and social history reviewed and updated as indicated. Interim medical history since our last visit reviewed. Allergies and medications reviewed and updated.  Review of Systems  Constitutional:  Negative for activity change, appetite change, diaphoresis, fatigue and fever.  HENT: Negative.    Respiratory:  Positive for cough. Negative for chest tightness, shortness of breath and wheezing.   Cardiovascular:  Negative for chest pain, palpitations and leg swelling.  Gastrointestinal: Negative.   Endocrine: Negative for cold intolerance, heat intolerance, polydipsia, polyphagia and polyuria.  Neurological: Negative.   Psychiatric/Behavioral: Negative.     Per HPI unless specifically indicated above     Objective:    BP 127/79   Pulse 94 Comment: apical  Temp 98.8 F (37.1 C) (Oral)   Wt 173 lb 3.2 oz (78.6 kg)   LMP  (LMP Unknown) Comment: age 75  SpO2 98%   BMI 28.58 kg/m   Wt Readings from Last 3 Encounters:  11/01/20 173 lb 3.2 oz (78.6 kg)  04/27/20 172 lb 3.2 oz (78.1 kg)  03/28/20 176 lb 9.6 oz (80.1 kg)    Physical Exam Vitals and nursing note reviewed.  Constitutional:      General: She is awake. She is not in acute distress.    Appearance: She is well-developed and overweight. She is not ill-appearing.  HENT:     Head: Normocephalic.     Right Ear: Hearing normal.     Left Ear: Hearing normal.  Eyes:     General: Lids are normal.        Right eye: No discharge.        Left eye: No discharge.     Conjunctiva/sclera: Conjunctivae normal.     Pupils: Pupils are equal, round, and reactive to light.  Neck:     Thyroid: No thyromegaly.     Vascular: No carotid bruit or JVD.  Cardiovascular:     Rate and Rhythm: Normal rate and regular rhythm.     Heart sounds: Normal heart sounds. No murmur heard.   No gallop.  Pulmonary:     Effort: Pulmonary effort is normal. No accessory muscle usage or respiratory  distress.     Breath sounds: Normal breath sounds.     Comments: No rhonchi or wheezes noted. Abdominal:     General: Bowel sounds are normal.     Palpations: Abdomen is soft.  Musculoskeletal:     Cervical back: Normal range of motion and neck supple.     Right lower leg: No edema.     Left lower leg: No edema.  Lymphadenopathy:     Cervical: No cervical adenopathy.  Skin:    General: Skin is warm and dry.  Neurological:     Mental Status: She is alert and oriented to person, place, and time.     Cranial Nerves: Cranial nerves are intact.     Motor: Motor function is intact.     Coordination: Coordination is intact.     Gait: Gait is intact.     Deep Tendon Reflexes: Reflexes are normal and symmetric.     Reflex Scores:      Brachioradialis reflexes are 2+ on the right side and 2+ on the left side.      Patellar reflexes are 2+ on the right side and 2+ on the left side. Psychiatric:        Attention and Perception: Attention normal.        Mood and Affect: Mood normal.        Behavior: Behavior normal. Behavior is cooperative.        Thought Content: Thought content normal.        Judgment: Judgment normal.   Results for orders placed or performed in visit on 11/01/20  Bayer DCA Hb A1c Waived  Result Value Ref Range   HB A1C (BAYER DCA - WAIVED) 7.7 (H) 4.8 - 5.6 %      Assessment & Plan:   Problem List Items Addressed This Visit       Cardiovascular and Mediastinum   Hypertension associated with diabetes ([place])    Chronic, stable with BP below goal in office today.  Continue current medication regimen and adjust as needed -- Lotensin offering kidney protection.  Recommend checking BP at home three mornings a week and documenting for provider review.  CMP today.  Return in 3 months.      Relevant Orders   Bayer DCA Hb A1c Waived (Completed)   Comprehensive metabolic panel     Digestive   GERD (gastroesophageal reflux disease)    Chronic, ongoing.  Continue current  medication regimen, Prilosec.  Check mag level today.        Relevant Orders   Magnesium     Endocrine   Hyperlipidemia associated with type 2 diabetes mellitus ([facility])    Chronic, ongoing.  Continue current medication regimen and adjust as needed.  Lipid panel today.      Relevant Orders   Bayer DCA Hb A1c Waived (Completed)   Lipid Panel w/o Chol/HDL Ratio   Poorly controlled type 2 diabetes mellitus ([place]) - Primary    Chronic, ongoing with poor adherence at times to diet which we are working on.  A1C last visit trend up to 9.3%, and today is coming down to 7.7%.   Rybelsus started in February 2022 -- we trialed increase to 14 MG but she feels this dropped her too low.  Urine ALB 30 last visit, continue Lotensin.  Continue Metformin 1000 M BID and Rybelsus 7 MG daily.  Continue diet changes at home.  Highly recommend she monitor BS daily at home and document daily with fasting BS goal of <130.  Will have return in 3 month for follow-up, plan to further increase to 14 MG if tolerating Rybelsus and not at goal with A1c.  Due to age would prefer to avoid return to Glipizide and prefer to avoid insulin -- discussed with her at length.        Relevant Orders   Bayer DCA Hb A1c Waived (Completed)     Other  Elevated liver enzymes    Check CMP today and recommend focus on diet changes.      Relevant Orders   Comprehensive metabolic panel   Cough    Acute x 2 weeks, negative Covid at home.  At this time obtain CXR and start Azithromycin daily for 6 days.  Refill on cough medication sent for rest at night.  Overall no distress and lung sounds stable today.  If ongoing episodes of cough, in future may need to refer to ENT for further allergy work-up and take off ACE.      Relevant Orders   CBC with Differential/Platelet   C-reactive protein   Sed Rate (ESR)   DG Chest 2 View   B12 deficiency    Noted low normal levels on labs in past.  Recommend she continue taking Vitamin B12 500 to  1000 MCG daily.  Recheck level next visit.  Explained to patient the benefit of annual B12 level testing while on daily Metformin.  Educated patient on the effect of Metformin on absorption of Vitamin B12 and symptoms of low B12 level, such as neuropathic pain.  "Reduced absorption of vitamin B12 is a known adverse effect of long-term use of Metformin" and "routine B12 monitoring may be considered in patients with poor dietary intake or absorption" Pollie Meyer, D. J., 2019 via UpToDate).           Follow up plan: Return in about 3 months (around 01/31/2021) for T2DM, HTN/HLD, ALLERGIES, GERD, B12.

## 2020-11-01 NOTE — Assessment & Plan Note (Signed)
Check CMP today and recommend focus on diet changes.

## 2020-11-01 NOTE — Assessment & Plan Note (Signed)
Chronic, ongoing.  Continue current medication regimen, Prilosec.  Check mag level today.

## 2020-11-02 LAB — CBC WITH DIFFERENTIAL/PLATELET
Basophils Absolute: 0.1 10*3/uL (ref 0.0–0.2)
Basos: 1 %
EOS (ABSOLUTE): 0.3 10*3/uL (ref 0.0–0.4)
Eos: 3 %
Hematocrit: 40 % (ref 34.0–46.6)
Hemoglobin: 13.6 g/dL (ref 11.1–15.9)
Immature Grans (Abs): 0 10*3/uL (ref 0.0–0.1)
Immature Granulocytes: 0 %
Lymphocytes Absolute: 2.8 10*3/uL (ref 0.7–3.1)
Lymphs: 32 %
MCH: 29 pg (ref 26.6–33.0)
MCHC: 34 g/dL (ref 31.5–35.7)
MCV: 85 fL (ref 79–97)
Monocytes Absolute: 0.5 10*3/uL (ref 0.1–0.9)
Monocytes: 6 %
Neutrophils Absolute: 5 10*3/uL (ref 1.4–7.0)
Neutrophils: 58 %
Platelets: 355 10*3/uL (ref 150–450)
RBC: 4.69 x10E6/uL (ref 3.77–5.28)
RDW: 12.6 % (ref 11.7–15.4)
WBC: 8.7 10*3/uL (ref 3.4–10.8)

## 2020-11-02 LAB — MAGNESIUM: Magnesium: 1.8 mg/dL (ref 1.6–2.3)

## 2020-11-02 LAB — COMPREHENSIVE METABOLIC PANEL
ALT: 46 IU/L — ABNORMAL HIGH (ref 0–32)
AST: 47 IU/L — ABNORMAL HIGH (ref 0–40)
Albumin/Globulin Ratio: 1.8 (ref 1.2–2.2)
Albumin: 4.9 g/dL — ABNORMAL HIGH (ref 3.8–4.8)
Alkaline Phosphatase: 48 IU/L (ref 44–121)
BUN/Creatinine Ratio: 22 (ref 12–28)
BUN: 11 mg/dL (ref 8–27)
Bilirubin Total: 0.4 mg/dL (ref 0.0–1.2)
CO2: 25 mmol/L (ref 20–29)
Calcium: 10 mg/dL (ref 8.7–10.3)
Chloride: 95 mmol/L — ABNORMAL LOW (ref 96–106)
Creatinine, Ser: 0.51 mg/dL — ABNORMAL LOW (ref 0.57–1.00)
Globulin, Total: 2.8 g/dL (ref 1.5–4.5)
Glucose: 137 mg/dL — ABNORMAL HIGH (ref 65–99)
Potassium: 4.2 mmol/L (ref 3.5–5.2)
Sodium: 137 mmol/L (ref 134–144)
Total Protein: 7.7 g/dL (ref 6.0–8.5)
eGFR: 100 mL/min/{1.73_m2} (ref >59)

## 2020-11-02 LAB — LIPID PANEL W/O CHOL/HDL RATIO
Cholesterol, Total: 202 mg/dL — ABNORMAL HIGH (ref 100–199)
HDL: 42 mg/dL (ref >39)
LDL Chol Calc (NIH): 102 mg/dL — ABNORMAL HIGH (ref 0–99)
Triglycerides: 343 mg/dL — ABNORMAL HIGH (ref 0–149)
VLDL Cholesterol Cal: 58 mg/dL — ABNORMAL HIGH (ref 5–40)

## 2020-11-02 LAB — SEDIMENTATION RATE: Sed Rate: 24 mm/hr (ref 0–40)

## 2020-11-02 LAB — C-REACTIVE PROTEIN: CRP: 2 mg/L (ref 0–10)

## 2020-11-02 NOTE — Progress Notes (Signed)
Good morning crew -- please let Liala know labs have returned: - CBC shows no infection or anemia - Liver function shows mild elevations, lower any alcohol intake or Tylenol use.  We will recheck next visit. - Cholesterol levels are elevated -- please ensure you are taking your Atorvastatin daily and we will recheck next visit, if ongoing elevations we may adjust medications.  Any questions? Keep being awesome!!  Thank you for allowing me to participate in your care.  I appreciate you. Kindest regards, Jaquelin Meaney

## 2020-11-22 ENCOUNTER — Ambulatory Visit (INDEPENDENT_AMBULATORY_CARE_PROVIDER_SITE_OTHER): Payer: Medicare Other

## 2020-11-22 ENCOUNTER — Other Ambulatory Visit: Payer: Self-pay

## 2020-11-22 DIAGNOSIS — Z23 Encounter for immunization: Secondary | ICD-10-CM | POA: Diagnosis not present

## 2020-11-22 NOTE — Progress Notes (Signed)
Patient presents today for flu vaccination, patient received in left deltoid, patient tolerated well

## 2020-12-12 DIAGNOSIS — M1712 Unilateral primary osteoarthritis, left knee: Secondary | ICD-10-CM | POA: Diagnosis not present

## 2020-12-12 DIAGNOSIS — M25562 Pain in left knee: Secondary | ICD-10-CM | POA: Diagnosis not present

## 2020-12-13 DIAGNOSIS — M1712 Unilateral primary osteoarthritis, left knee: Secondary | ICD-10-CM | POA: Insufficient documentation

## 2020-12-18 ENCOUNTER — Telehealth: Payer: Self-pay | Admitting: Nurse Practitioner

## 2020-12-18 NOTE — Telephone Encounter (Signed)
Copied from [place] [id number]. Topic: General - Other >> Dec 18, 2020  2:27 PM Cheryl Mendez wrote: Reason for CRM: Pt would like an RX or samples of Bio Freeze for her left knee pain/ pt has had xrays but needs something to go on it/ they didn't want to do shots yet/ pt stated she was given this Bio freeze before for her shoulder/ please advise

## 2020-12-19 NOTE — Telephone Encounter (Signed)
Please see below and advise as needed.

## 2020-12-19 NOTE — Telephone Encounter (Signed)
Routing to [place] to advise. We do not have Biofreeze samples.

## 2020-12-19 NOTE — Telephone Encounter (Signed)
Pt called back today wanting to know if the office had any bio freeze samples or if Dr. Ned Card could send something to the pharmacy for knee pain .  CB#  [phone]

## 2020-12-20 NOTE — Telephone Encounter (Signed)
Patient returned call and states she was seen at [facility] a few days ago and they advised to take aleve and icy hot for left knee pain. Patient states that has helped but she was hoping PCP could prescribe her a cream. Patient will bring x rays with her. Patient scheduled for Friday 12/22/2020 at 2:20pm with PCP

## 2020-12-20 NOTE — Telephone Encounter (Signed)
Called pt to schedule appt.was unable to leave message due to full mailbox.

## 2020-12-20 NOTE — Telephone Encounter (Signed)
Called pt to schedule appt. For knee pain, was unable to leave message due to full mailbox.

## 2020-12-20 NOTE — Telephone Encounter (Signed)
Appointment scheduled.

## 2020-12-22 ENCOUNTER — Ambulatory Visit (INDEPENDENT_AMBULATORY_CARE_PROVIDER_SITE_OTHER): Payer: Medicare Other | Admitting: Nurse Practitioner

## 2020-12-22 ENCOUNTER — Other Ambulatory Visit: Payer: Self-pay

## 2020-12-22 ENCOUNTER — Encounter: Payer: Self-pay | Admitting: Nurse Practitioner

## 2020-12-22 ENCOUNTER — Telehealth: Payer: Self-pay | Admitting: Nurse Practitioner

## 2020-12-22 DIAGNOSIS — M1712 Unilateral primary osteoarthritis, left knee: Secondary | ICD-10-CM | POA: Diagnosis not present

## 2020-12-22 MED ORDER — MELOXICAM 7.5 MG PO TABS: 7.5000 mg | ORAL_TABLET | Freq: Every day | ORAL | 0 refills | Status: DC

## 2020-12-22 NOTE — Patient Instructions (Addendum)
-  Try Tylenol 1000 MG three times a day as needed. - Use Voltaren gel as needed to knee - Use ice application to knee when swollen, apply for 20 minutes every 4 hours and remove. - Wear your knee support  Acute Knee Pain, Adult Many things can cause knee pain. Sometimes, knee pain is sudden (acute) and may be caused by damage, swelling, or irritation of the muscles and tissues that support your knee. The pain often goes away on its own with time and rest. If the pain does not go away, tests may be done to find out what is causing the pain. Follow these instructions at home: If you have a knee sleeve or brace:  Wear the knee sleeve or brace as told by your doctor. Take it off only as told by your doctor. Loosen it if your toes: Tingle. Become numb. Turn cold and blue. Keep it clean. If the knee sleeve or brace is not waterproof: Do not let it get wet. Cover it with a watertight covering when you take a bath or shower. Activity Rest your knee. Do not do things that cause pain or make pain worse. Avoid activities where both feet leave the ground at the same time (high-impact activities). Examples are running, jumping rope, and doing jumping jacks. Work with a physical therapist to make a safe exercise program, as told by your doctor. Managing pain, stiffness, and swelling  If told, put ice on the knee. To do this: If you have a removable knee sleeve or brace, take it off as told by your doctor. Put ice in a plastic bag. Place a towel between your skin and the bag. Leave the ice on for 20 minutes, 2-3 times a day. Take off the ice if your skin turns bright red. This is very important. If you cannot feel pain, heat, or cold, you have a greater risk of damage to the area. If told, use an elastic bandage to put pressure (compression) on your injured knee. Raise your knee above the level of your heart while you are sitting or lying down. Sleep with a pillow under your knee. General  instructions Take over-the-counter and prescription medicines only as told by your doctor. Do not smoke or use any products that contain nicotine or tobacco. If you need help quitting, ask your doctor. If you are overweight, work with your doctor and a food expert (dietitian) to set goals to lose weight. Being overweight can make your knee hurt more. Watch for any changes in your symptoms. Keep all follow-up visits. Contact a doctor if: The knee pain does not stop. The knee pain changes or gets worse. You have a fever along with knee pain. Your knee is red or feels warm when you touch it. Your knee gives out or locks up. Get help right away if: Your knee swells, and the swelling gets worse. You cannot move your knee. You have very bad knee pain that does not get better with pain medicine. Summary Many things can cause knee pain. The pain often goes away on its own with time and rest. Your doctor may do tests to find out the cause of the pain. Watch for any changes in your symptoms. Relieve your pain with rest, medicines, light activity, and use of ice. Get help right away if you cannot move your knee or your knee pain is very bad. This information is not intended to replace advice given to you by your health care provider. Make sure  you discuss any questions you have with your health care provider. Document Revised: 07/21/2019 Document Reviewed: 07/21/2019 Elsevier Patient Education  2022 [employer].

## 2020-12-22 NOTE — Telephone Encounter (Signed)
Pt has requested to refill prescription, she only has 1 pill left.  Please advise.   Semaglutide (RYBELSUS) 7 MG TABS [id number]

## 2020-12-22 NOTE — Progress Notes (Signed)
BP 119/76   Pulse 88 Comment: apical  Temp 98.9 F (37.2 C) (Oral)   Ht 5' 5.28" (1.658 m)   Wt 174 lb 9.6 oz (79.2 kg)   LMP  (LMP Unknown) Comment: age 70  SpO2 97%   BMI 28.81 kg/m    Subjective:    Patient ID: Cheryl Mendez, female    DOB: Mar 08, 1950, 70 y.o.   MRN: [id number]  HPI: Cheryl Mendez is a 70 y.o. female  Chief Complaint  Patient presents with   Knee Pain    Left knee pain, went to [facility] and they took x rays, and told her to take aleve and to get a knee brace. Wants a prescription of BioFreeze   KNEE PAIN Recently seen at [facility] for this, as was taking her neighbor there for a sprained ankle.  While there she had her knee looked at.  Had imaging and brought these with her -- noted to have significant joint space loss, especially to medial aspect.  She reports provider did not want to do joint injection that day and recommended support and rest.    Pain is getting worse, making it hard for her to sleep. Duration: chronic Involved knee: left Mechanism of injury: unknown Location:medial Onset: gradual Severity: 9/10  Quality:  dull, aching, and throbbing Frequency: intermittent Radiation: no Aggravating factors: weight bearing, walking, bending, and movement  Alleviating factors:  Aleeve, NSAIDs, and rest  Status: worse Treatments attempted: Aleeve, NSAIDs, and rest   Relief with NSAIDs?:  moderate Weakness with weight bearing or walking: yes Sensation of giving way: yes Locking: yes Popping: yes Bruising: no Swelling: yes Redness: no Paresthesias/decreased sensation: no Fevers: no   Relevant past medical, surgical, family and social history reviewed and updated as indicated. Interim medical history since our last visit reviewed. Allergies and medications reviewed and updated.  Review of Systems  Constitutional:  Negative for activity change, appetite change, diaphoresis, fatigue and fever.  HENT: Negative.    Respiratory:   Negative for cough, chest tightness, shortness of breath and wheezing.   Cardiovascular:  Negative for chest pain, palpitations and leg swelling.  Gastrointestinal: Negative.   Endocrine: Negative for cold intolerance, heat intolerance, polydipsia, polyphagia and polyuria.  Musculoskeletal:  Positive for arthralgias.  Neurological: Negative.   Psychiatric/Behavioral: Negative.     Per HPI unless specifically indicated above     Objective:    BP 119/76   Pulse 88 Comment: apical  Temp 98.9 F (37.2 C) (Oral)   Ht 5' 5.28" (1.658 m)   Wt 174 lb 9.6 oz (79.2 kg)   LMP  (LMP Unknown) Comment: age 70  SpO2 97%   BMI 28.81 kg/m   Wt Readings from Last 3 Encounters:  12/22/20 174 lb 9.6 oz (79.2 kg)  11/01/20 173 lb 3.2 oz (78.6 kg)  04/27/20 172 lb 3.2 oz (78.1 kg)    Physical Exam Vitals and nursing note reviewed.  Constitutional:      General: She is awake. She is not in acute distress.    Appearance: She is well-developed and overweight. She is not ill-appearing.  HENT:     Head: Normocephalic.     Right Ear: Hearing normal.     Left Ear: Hearing normal.  Eyes:     General: Lids are normal.        Right eye: No discharge.        Left eye: No discharge.     Conjunctiva/sclera: Conjunctivae normal.  Pupils: Pupils are equal, round, and reactive to light.  Neck:     Thyroid: No thyromegaly.     Vascular: No carotid bruit or JVD.  Cardiovascular:     Rate and Rhythm: Normal rate and regular rhythm.     Heart sounds: Normal heart sounds. No murmur heard.   No gallop.  Pulmonary:     Effort: Pulmonary effort is normal. No accessory muscle usage or respiratory distress.     Breath sounds: Normal breath sounds.  Abdominal:     General: Bowel sounds are normal.     Palpations: Abdomen is soft.  Musculoskeletal:     Cervical back: Normal range of motion and neck supple.     Right knee: Normal.     Left knee: Swelling (to medial aspect) and crepitus present. No  effusion, erythema, lacerations or bony tenderness. Decreased range of motion. Tenderness present over the medial joint line.     Instability Tests: Anterior Lachman test negative. Medial McMurray test negative and lateral McMurray test negative.     Right lower leg: No edema.     Left lower leg: No edema.  Lymphadenopathy:     Cervical: No cervical adenopathy.  Skin:    General: Skin is warm and dry.  Neurological:     Mental Status: She is alert and oriented to person, place, and time.     Motor: Motor function is intact.     Coordination: Coordination is intact.     Gait: Gait is intact.     Deep Tendon Reflexes: Reflexes are normal and symmetric.     Reflex Scores:      Brachioradialis reflexes are 2+ on the right side and 2+ on the left side.      Patellar reflexes are 2+ on the right side and 2+ on the left side. Psychiatric:        Attention and Perception: Attention normal.        Mood and Affect: Mood normal.        Behavior: Behavior normal. Behavior is cooperative.        Thought Content: Thought content normal.        Judgment: Judgment normal.    Results for orders placed or performed in visit on 11/01/20  Bayer DCA Hb A1c Waived  Result Value Ref Range   HB A1C (BAYER DCA - WAIVED) 7.7 (H) 4.8 - 5.6 %  CBC with Differential/Platelet  Result Value Ref Range   WBC 8.7 3.4 - 10.8 x10E3/uL   RBC 4.69 3.77 - 5.28 x10E6/uL   Hemoglobin 13.6 11.1 - 15.9 g/dL   Hematocrit 40.0 34.0 - 46.6 %   MCV 85 79 - 97 fL   MCH 29.0 26.6 - 33.0 pg   MCHC 34.0 31.5 - 35.7 g/dL   RDW 12.6 11.7 - 15.4 %   Platelets 355 150 - 450 x10E3/uL   Neutrophils 58 Not Estab. %   Lymphs 32 Not Estab. %   Monocytes 6 Not Estab. %   Eos 3 Not Estab. %   Basos 1 Not Estab. %   Neutrophils Absolute 5.0 1.4 - 7.0 x10E3/uL   Lymphocytes Absolute 2.8 0.7 - 3.1 x10E3/uL   Monocytes Absolute 0.5 0.1 - 0.9 x10E3/uL   EOS (ABSOLUTE) 0.3 0.0 - 0.4 x10E3/uL   Basophils Absolute 0.1 0.0 - 0.2 x10E3/uL    Immature Granulocytes 0 Not Estab. %   Immature Grans (Abs) 0.0 0.0 - 0.1 x10E3/uL  Comprehensive metabolic panel  Result Value  Ref Range   Glucose 137 (H) 65 - 99 mg/dL   BUN 11 8 - 27 mg/dL   Creatinine, Ser 0.51 (L) 0.57 - 1.00 mg/dL   eGFR 100 >59 mL/min/1.73   BUN/Creatinine Ratio 22 12 - 28   Sodium 137 134 - 144 mmol/L   Potassium 4.2 3.5 - 5.2 mmol/L   Chloride 95 (L) 96 - 106 mmol/L   CO2 25 20 - 29 mmol/L   Calcium 10.0 8.7 - 10.3 mg/dL   Total Protein 7.7 6.0 - 8.5 g/dL   Albumin 4.9 (H) 3.8 - 4.8 g/dL   Globulin, Total 2.8 1.5 - 4.5 g/dL   Albumin/Globulin Ratio 1.8 1.2 - 2.2   Bilirubin Total 0.4 0.0 - 1.2 mg/dL   Alkaline Phosphatase 48 44 - 121 IU/L   AST 47 (H) 0 - 40 IU/L   ALT 46 (H) 0 - 32 IU/L  Lipid Panel w/o Chol/HDL Ratio  Result Value Ref Range   Cholesterol, Total 202 (H) 100 - 199 mg/dL   Triglycerides 343 (H) 0 - 149 mg/dL   HDL 42 >39 mg/dL   VLDL Cholesterol Cal 58 (H) 5 - 40 mg/dL   LDL Chol Calc (NIH) 102 (H) 0 - 99 mg/dL  C-reactive protein  Result Value Ref Range   CRP 2 0 - 10 mg/L  Sed Rate (ESR)  Result Value Ref Range   Sed Rate 24 0 - 40 mm/hr  Magnesium  Result Value Ref Range   Magnesium 1.8 1.6 - 2.3 mg/dL      Assessment & Plan:   Problem List Items Addressed This Visit       Musculoskeletal and Integument   Osteoarthritis of left knee    Chronic, ongoing, reviewed imaging with patient from [facility] and explained to her findings.  At this time recommend: - Meloxicam 7.5 MG daily by mouth, script sent.  Stop Aleeve or Ibuprofen. - Tylenol 1000 MG TID as needed - Wear knee support daily - Apply ice to medial aspect left knee for 20 minutes 4 times a day - Rest knee when possible Return to office as scheduled in December, if ongoing may consider knee injection (is diabetic).      Relevant Medications   meloxicam (MOBIC) 7.5 MG tablet     Follow up plan: Return for as scheduled 01/31/21 -- may get knee  injection.

## 2020-12-22 NOTE — Assessment & Plan Note (Signed)
Chronic, ongoing, reviewed imaging with patient from [facility] and explained to her findings.  At this time recommend: - Meloxicam 7.5 MG daily by mouth, script sent.  Stop Aleeve or Ibuprofen. - Tylenol 1000 MG TID as needed - Wear knee support daily - Apply ice to medial aspect left knee for 20 minutes 4 times a day - Rest knee when possible Return to office as scheduled in December, if ongoing may consider knee injection (is diabetic).

## 2020-12-22 NOTE — Telephone Encounter (Signed)
According to chart, patient should still have refills on medication.   Tried calling [facility], on hold for over 8 minutes with 3 callers still ahead of me. Will try to call again Monday.

## 2020-12-27 NOTE — Telephone Encounter (Signed)
Called and confirmed with the pharmacy that the patient has refills. RX was picked up yesterday per the pharmacy.

## 2021-01-25 DIAGNOSIS — E113393 Type 2 diabetes mellitus with moderate nonproliferative diabetic retinopathy without macular edema, bilateral: Secondary | ICD-10-CM | POA: Diagnosis not present

## 2021-01-25 LAB — HM DIABETES EYE EXAM

## 2021-01-31 ENCOUNTER — Other Ambulatory Visit: Payer: Self-pay

## 2021-01-31 ENCOUNTER — Ambulatory Visit (INDEPENDENT_AMBULATORY_CARE_PROVIDER_SITE_OTHER): Payer: Medicare Other | Admitting: Nurse Practitioner

## 2021-01-31 ENCOUNTER — Encounter: Payer: Self-pay | Admitting: Nurse Practitioner

## 2021-01-31 VITALS — BP 114/76 | HR 87 | Temp 98.0°F | Wt 174.0 lb

## 2021-01-31 DIAGNOSIS — K219 Gastro-esophageal reflux disease without esophagitis: Secondary | ICD-10-CM

## 2021-01-31 DIAGNOSIS — R748 Abnormal levels of other serum enzymes: Secondary | ICD-10-CM | POA: Diagnosis not present

## 2021-01-31 DIAGNOSIS — I152 Hypertension secondary to endocrine disorders: Secondary | ICD-10-CM

## 2021-01-31 DIAGNOSIS — E1159 Type 2 diabetes mellitus with other circulatory complications: Secondary | ICD-10-CM

## 2021-01-31 DIAGNOSIS — E785 Hyperlipidemia, unspecified: Secondary | ICD-10-CM | POA: Diagnosis not present

## 2021-01-31 DIAGNOSIS — E538 Deficiency of other specified B group vitamins: Secondary | ICD-10-CM | POA: Diagnosis not present

## 2021-01-31 DIAGNOSIS — M1712 Unilateral primary osteoarthritis, left knee: Secondary | ICD-10-CM

## 2021-01-31 DIAGNOSIS — E1165 Type 2 diabetes mellitus with hyperglycemia: Secondary | ICD-10-CM | POA: Diagnosis not present

## 2021-01-31 DIAGNOSIS — E1169 Type 2 diabetes mellitus with other specified complication: Secondary | ICD-10-CM

## 2021-01-31 DIAGNOSIS — J301 Allergic rhinitis due to pollen: Secondary | ICD-10-CM | POA: Diagnosis not present

## 2021-01-31 MED ORDER — METFORMIN HCL 1000 MG PO TABS: ORAL_TABLET | ORAL | 4 refills | Status: DC

## 2021-01-31 MED ORDER — ATORVASTATIN CALCIUM 10 MG PO TABS: ORAL_TABLET | ORAL | 4 refills | Status: DC

## 2021-01-31 MED ORDER — OMEPRAZOLE 20 MG PO CPDR: 20.0000 mg | DELAYED_RELEASE_CAPSULE | Freq: Every day | ORAL | 4 refills | Status: DC

## 2021-01-31 MED ORDER — LORATADINE 10 MG PO TABS: ORAL_TABLET | ORAL | 4 refills | Status: DC

## 2021-01-31 MED ORDER — RYBELSUS 7 MG PO TABS: 1.0000 | ORAL_TABLET | Freq: Every day | ORAL | 12 refills | Status: DC

## 2021-01-31 NOTE — Assessment & Plan Note (Signed)
Noted low normal levels on labs in past.  Recommend she continue taking Vitamin B12 500 to 1000 MCG daily.  Recheck level today.  Explained to patient the benefit of annual B12 level testing while on daily Metformin.  Educated patient on the effect of Metformin on absorption of Vitamin B12 and symptoms of low B12 level, such as neuropathic pain.  "Reduced absorption of vitamin B12 is a known adverse effect of long-term use of Metformin" and "routine B12 monitoring may be considered in patients with poor dietary intake or absorption" (Wexler, D. J., 2019 via UpToDate).    

## 2021-01-31 NOTE — Patient Instructions (Signed)
Acute Knee Pain, Adult °Many things can cause knee pain. Sometimes, knee pain is sudden (acute) and may be caused by damage, swelling, or irritation of the muscles and tissues that support your knee. °The pain often goes away on its own with time and rest. If the pain does not go away, tests may be done to find out what is causing the pain. °Follow these instructions at home: °If you have a knee sleeve or brace: ° °Wear the knee sleeve or brace as told by your doctor. Take it off only as told by your doctor. °Loosen it if your toes: °Tingle. °Become numb. °Turn cold and blue. °Keep it clean. °If the knee sleeve or brace is not waterproof: °Do not let it get wet. °Cover it with a watertight covering when you take a bath or shower. °Activity °Rest your knee. °Do not do things that cause pain or make pain worse. °Avoid activities where both feet leave the ground at the same time (high-impact activities). Examples are running, jumping rope, and doing jumping jacks. °Work with a physical therapist to make a safe exercise program, as told by your doctor. °Managing pain, stiffness, and swelling ° °If told, put ice on the knee. To do this: °If you have a removable knee sleeve or brace, take it off as told by your doctor. °Put ice in a plastic bag. °Place a towel between your skin and the bag. °Leave the ice on for 20 minutes, 2-3 times a day. °Take off the ice if your skin turns bright red. This is very important. If you cannot feel pain, heat, or cold, you have a greater risk of damage to the area. °If told, use an elastic bandage to put pressure (compression) on your injured knee. °Raise your knee above the level of your heart while you are sitting or lying down. °Sleep with a pillow under your knee. °General instructions °Take over-the-counter and prescription medicines only as told by your doctor. °Do not smoke or use any products that contain nicotine or tobacco. If you need help quitting, ask your doctor. °If you are  overweight, work with your doctor and a food expert (dietitian) to set goals to lose weight. Being overweight can make your knee hurt more. °Watch for any changes in your symptoms. °Keep all follow-up visits. °Contact a doctor if: °The knee pain does not stop. °The knee pain changes or gets worse. °You have a fever along with knee pain. °Your knee is red or feels warm when you touch it. °Your knee gives out or locks up. °Get help right away if: °Your knee swells, and the swelling gets worse. °You cannot move your knee. °You have very bad knee pain that does not get better with pain medicine. °Summary °Many things can cause knee pain. The pain often goes away on its own with time and rest. °Your doctor may do tests to find out the cause of the pain. °Watch for any changes in your symptoms. Relieve your pain with rest, medicines, light activity, and use of ice. °Get help right away if you cannot move your knee or your knee pain is very bad. °This information is not intended to replace advice given to you by your health care provider. Make sure you discuss any questions you have with your health care provider. °Document Revised: 07/21/2019 Document Reviewed: 07/21/2019 °Elsevier Patient Education © 2022 Elsevier Inc. ° °

## 2021-01-31 NOTE — Assessment & Plan Note (Signed)
Chronic, ongoing, with joint space loss on imaging.  At this time recommend: - Meloxicam 7.5 MG daily by mouth as needed only.  Stop Aleeve or Ibuprofen. - Tylenol 1000 MG TID as needed - Wear knee support daily - Apply ice to medial aspect left knee for 20 minutes 4 times a day - Rest knee when possible Return to office in 3 months, if ongoing may consider knee injection (is diabetic).

## 2021-01-31 NOTE — Assessment & Plan Note (Signed)
Check CMP today and recommend focus on diet changes.

## 2021-01-31 NOTE — Addendum Note (Signed)
Addended by: Marnee Guarneri T on: 01/31/2021 08:45 AM   Modules accepted: Orders

## 2021-01-31 NOTE — Assessment & Plan Note (Signed)
Ongoing.  At this time will continue Flonase and Singulair at HS, Claritin as needed.  Could consider referral to allergist or ENT in future if ongoing symptoms.

## 2021-01-31 NOTE — Assessment & Plan Note (Signed)
Chronic, ongoing with poor adherence at times to diet which we are working on.  A1C last visit trend down last visit to 7.7%, will recheck today.   Rybelsus started in February 2022 -- we trialed increase to 14 MG but she feels this dropped her too low.  Urine ALB 30 last visit, continue Lotensin and recheck today.  Continue Metformin 1000 M BID and Rybelsus 7 MG daily.  Continue diet changes at home.  Highly recommend she monitor BS daily at home and document daily with fasting BS goal of <130.  Will have return in 3 month for follow-up, plan to further increase to 14 MG if tolerating Rybelsus and not at goal with A1c.  Due to age would prefer to avoid return to Glipizide and prefer to avoid insulin -- discussed with her at length.

## 2021-01-31 NOTE — Assessment & Plan Note (Signed)
Chronic, ongoing.  Continue current medication regimen, Prilosec.  Mag level annually.

## 2021-01-31 NOTE — Assessment & Plan Note (Signed)
Chronic, stable with BP below goal in office today.  Continue current medication regimen and adjust as needed -- Lotensin offering kidney protection.  Recommend checking BP at home three mornings a week and documenting for provider review.  CMP today.  Return in 3 months.

## 2021-01-31 NOTE — Assessment & Plan Note (Signed)
Chronic, ongoing.  Continue current medication regimen and adjust as needed. Lipid panel today. 

## 2021-01-31 NOTE — Progress Notes (Signed)
BP 114/76    Pulse 87    Temp 98 F (36.7 C)    Wt 174 lb (78.9 kg)    LMP  (LMP Unknown) Comment: age 70   SpO2 100%    BMI 28.71 kg/m    Subjective:    Patient ID: Cheryl Mendez, female    DOB: 10/01/1950, 70 y.o.   MRN: [id number]  HPI: Cheryl Mendez is a 70 y.o. female  Chief Complaint  Patient presents with   Diabetes    Patient recent Diabetic Eye Exam was requested at today's visit.    Hyperlipidemia   Hypertension   Allergic Rhinitis    Gastroesophageal Reflux   Vitamin B12   Knee Pain    Patient states she is here to follow up on her knee. Patient states she is here to discuss her knee with provider. Patient states she is following with Meloxicam prescription. Patient states she feels it helps some, but she gets relief from Aleve as well.    She does not want colonoscopy + no mammogram or DEXA until next year.  DIABETES Last A1C in September 7.7%, trended down.  Continues on Metformin 1000 MG BID and Rybelsus 7 MG daily, did trial an increase in past to 14 MG which made her feel shaky.   Previously has been on Trulicity, which caused mild GI issues. Glipizide taken in past, caused low sugars. Jardiance caused UTI frequently.     Recent B12 level was 485 -- she is taking supplement at this time.   Hypoglycemic episodes:no Polydipsia/polyuria: no Visual disturbance: no Chest pain: no Paresthesias: no Glucose Monitoring: no             Accucheck frequency: a few times a week             Fasting glucose: 160 this morning             Post prandial:             Evening:             Before meals: Taking Insulin?: no             Long acting insulin:             Short acting insulin: Blood Pressure Monitoring: not checking Retinal Examination: Up to Date -- had performed at [facility] on Tuesday Foot Exam: Up to Date Pneumovax: Up to Date Influenza: Up to Date Aspirin: yes   GERD Continues on Omeprazole 20 MG intermittently.  Is also taking medications for her  ongoing allergy issues -- Claritin, Flonase. GERD control status: stable Satisfied with current treatment? yes Heartburn frequency: occasional Medication side effects: no  Medication compliance: stable Previous GERD medications: Antacid use frequency:  none Dysphagia: no Odynophagia:  no Hematemesis: no Blood in stool: no EGD: no    HYPERTENSION / HYPERLIPIDEMIA Currently taking Lotensin 20-25 and Atorvastatin 10 MG. History of mild elevations periodically on LFTs, denies alcohol use. Satisfied with current treatment? yes Duration of hypertension: chronic BP monitoring frequency: not checking BP range:  BP medication side effects: no Duration of hyperlipidemia: chronic Cholesterol medication side effects: no Cholesterol supplements: none Medication compliance: good compliance Aspirin: yes Recent stressors: no Recurrent headaches: no Visual changes: no Palpitations: no Dyspnea: no Chest pain: no Lower extremity edema: no Dizzy/lightheaded: no The 10-year ASCVD risk score (Arnett DK, et al., 2019) is: 19.6%   Values used to calculate the score:     Age: 32 years  Sex: Female     Is Non-Hispanic African American: No     Diabetic: Yes     Tobacco smoker: No     Systolic Blood Pressure: 194 mmHg     Is BP treated: Yes     HDL Cholesterol: 42 mg/dL     Total Cholesterol: 202 mg/dL  KNEE PAIN (LEFT) Has been seen at [facility] for this, as was taking her neighbor there for a sprained ankle.  Had imaging -- noted to have significant joint space loss, especially to medial aspect.  She reports provider did not want to do joint injection that day and recommended support and rest.  Taking Meloxicam and Aleeve at present -- but continues to have ongoing pain.    Pain is getting worse, making it hard for her to sleep.  Using Icy/Hot and brace. Duration: chronic Involved knee: left Mechanism of injury: unknown Location:medial Onset: gradual Severity: 7/10  Quality:   dull, aching, and throbbing Frequency: intermittent Radiation: no Aggravating factors: weight bearing, walking, bending, and movement  Alleviating factors:  Aleeve, NSAIDs, and rest  Status: worse Treatments attempted: Aleeve, NSAIDs, and rest   Relief with NSAIDs?:  moderate Weakness with weight bearing or walking: yes Sensation of giving way: yes Locking: none Popping: yes Bruising: no Swelling: yes Redness: no Paresthesias/decreased sensation: no Fevers: no   Relevant past medical, surgical, family and social history reviewed and updated as indicated. Interim medical history since our last visit reviewed. Allergies and medications reviewed and updated.  Review of Systems  Constitutional:  Negative for activity change, appetite change, diaphoresis, fatigue and fever.  HENT: Negative.    Respiratory:  Negative for cough, chest tightness, shortness of breath and wheezing.   Cardiovascular:  Negative for chest pain, palpitations and leg swelling.  Gastrointestinal: Negative.   Endocrine: Negative for cold intolerance, heat intolerance, polydipsia, polyphagia and polyuria.  Musculoskeletal:  Positive for arthralgias.  Neurological: Negative.   Psychiatric/Behavioral: Negative.     Per HPI unless specifically indicated above     Objective:    BP 114/76    Pulse 87    Temp 98 F (36.7 C)    Wt 174 lb (78.9 kg)    LMP  (LMP Unknown) Comment: age 30   SpO2 100%    BMI 28.71 kg/m   Wt Readings from Last 3 Encounters:  01/31/21 174 lb (78.9 kg)  12/22/20 174 lb 9.6 oz (79.2 kg)  11/01/20 173 lb 3.2 oz (78.6 kg)    Physical Exam Vitals and nursing note reviewed.  Constitutional:      General: She is awake. She is not in acute distress.    Appearance: She is well-developed and overweight. She is not ill-appearing.  HENT:     Head: Normocephalic.     Right Ear: Hearing normal.     Left Ear: Hearing normal.  Eyes:     General: Lids are normal.        Right eye: No  discharge.        Left eye: No discharge.     Conjunctiva/sclera: Conjunctivae normal.     Pupils: Pupils are equal, round, and reactive to light.  Neck:     Thyroid: No thyromegaly.     Vascular: No carotid bruit or JVD.  Cardiovascular:     Rate and Rhythm: Normal rate and regular rhythm.     Heart sounds: Normal heart sounds. No murmur heard.   No gallop.  Pulmonary:     Effort: Pulmonary  effort is normal. No accessory muscle usage or respiratory distress.     Breath sounds: Normal breath sounds.  Abdominal:     General: Bowel sounds are normal.     Palpations: Abdomen is soft.  Musculoskeletal:     Cervical back: Normal range of motion and neck supple.     Right knee: Normal.     Left knee: Swelling (medial aspect) and crepitus present. No erythema or bony tenderness. Decreased range of motion. Tenderness present over the medial joint line.     Right lower leg: No edema.     Left lower leg: No edema.  Lymphadenopathy:     Cervical: No cervical adenopathy.  Skin:    General: Skin is warm and dry.  Neurological:     Mental Status: She is alert and oriented to person, place, and time.     Motor: Motor function is intact.     Coordination: Coordination is intact.     Gait: Gait is intact.     Deep Tendon Reflexes: Reflexes are normal and symmetric.     Reflex Scores:      Brachioradialis reflexes are 2+ on the right side and 2+ on the left side.      Patellar reflexes are 2+ on the right side and 2+ on the left side. Psychiatric:        Attention and Perception: Attention normal.        Mood and Affect: Mood normal.        Behavior: Behavior normal. Behavior is cooperative.        Thought Content: Thought content normal.        Judgment: Judgment normal.   Results for orders placed or performed in visit on 11/01/20  Bayer DCA Hb A1c Waived  Result Value Ref Range   HB A1C (BAYER DCA - WAIVED) 7.7 (H) 4.8 - 5.6 %  CBC with Differential/Platelet  Result Value Ref Range    WBC 8.7 3.4 - 10.8 x10E3/uL   RBC 4.69 3.77 - 5.28 x10E6/uL   Hemoglobin 13.6 11.1 - 15.9 g/dL   Hematocrit 40.0 34.0 - 46.6 %   MCV 85 79 - 97 fL   MCH 29.0 26.6 - 33.0 pg   MCHC 34.0 31.5 - 35.7 g/dL   RDW 12.6 11.7 - 15.4 %   Platelets 355 150 - 450 x10E3/uL   Neutrophils 58 Not Estab. %   Lymphs 32 Not Estab. %   Monocytes 6 Not Estab. %   Eos 3 Not Estab. %   Basos 1 Not Estab. %   Neutrophils Absolute 5.0 1.4 - 7.0 x10E3/uL   Lymphocytes Absolute 2.8 0.7 - 3.1 x10E3/uL   Monocytes Absolute 0.5 0.1 - 0.9 x10E3/uL   EOS (ABSOLUTE) 0.3 0.0 - 0.4 x10E3/uL   Basophils Absolute 0.1 0.0 - 0.2 x10E3/uL   Immature Granulocytes 0 Not Estab. %   Immature Grans (Abs) 0.0 0.0 - 0.1 x10E3/uL  Comprehensive metabolic panel  Result Value Ref Range   Glucose 137 (H) 65 - 99 mg/dL   BUN 11 8 - 27 mg/dL   Creatinine, Ser 0.51 (L) 0.57 - 1.00 mg/dL   eGFR 100 >59 mL/min/1.73   BUN/Creatinine Ratio 22 12 - 28   Sodium 137 134 - 144 mmol/L   Potassium 4.2 3.5 - 5.2 mmol/L   Chloride 95 (L) 96 - 106 mmol/L   CO2 25 20 - 29 mmol/L   Calcium 10.0 8.7 - 10.3 mg/dL   Total Protein 7.7 6.0 -  8.5 g/dL   Albumin 4.9 (H) 3.8 - 4.8 g/dL   Globulin, Total 2.8 1.5 - 4.5 g/dL   Albumin/Globulin Ratio 1.8 1.2 - 2.2   Bilirubin Total 0.4 0.0 - 1.2 mg/dL   Alkaline Phosphatase 48 44 - 121 IU/L   AST 47 (H) 0 - 40 IU/L   ALT 46 (H) 0 - 32 IU/L  Lipid Panel w/o Chol/HDL Ratio  Result Value Ref Range   Cholesterol, Total 202 (H) 100 - 199 mg/dL   Triglycerides 343 (H) 0 - 149 mg/dL   HDL 42 >39 mg/dL   VLDL Cholesterol Cal 58 (H) 5 - 40 mg/dL   LDL Chol Calc (NIH) 102 (H) 0 - 99 mg/dL  C-reactive protein  Result Value Ref Range   CRP 2 0 - 10 mg/L  Sed Rate (ESR)  Result Value Ref Range   Sed Rate 24 0 - 40 mm/hr  Magnesium  Result Value Ref Range   Magnesium 1.8 1.6 - 2.3 mg/dL      Assessment & Plan:   Problem List Items Addressed This Visit       Cardiovascular and Mediastinum    Hypertension associated with diabetes ([place])    Chronic, stable with BP below goal in office today.  Continue current medication regimen and adjust as needed -- Lotensin offering kidney protection.  Recommend checking BP at home three mornings a week and documenting for provider review.  CMP today.  Return in 3 months.      Relevant Medications   atorvastatin (LIPITOR) 10 MG tablet   metFORMIN (GLUCOPHAGE) 1000 MG tablet   Semaglutide (RYBELSUS) 7 MG TABS   Other Relevant Orders   Microalbumin, Urine Waived   Comprehensive metabolic panel   HgB E3M     Respiratory   Allergic rhinitis    Ongoing.  At this time will continue Flonase and Singulair at HS, Claritin as needed.  Could consider referral to allergist or ENT in future if ongoing symptoms.          Digestive   GERD (gastroesophageal reflux disease)    Chronic, ongoing.  Continue current medication regimen, Prilosec.  Mag level annually.      Relevant Medications   omeprazole (PRILOSEC) 20 MG capsule     Endocrine   Hyperlipidemia associated with type 2 diabetes mellitus (HCC)    Chronic, ongoing.  Continue current medication regimen and adjust as needed.  Lipid panel today.      Relevant Medications   atorvastatin (LIPITOR) 10 MG tablet   metFORMIN (GLUCOPHAGE) 1000 MG tablet   Semaglutide (RYBELSUS) 7 MG TABS   Other Relevant Orders   Lipid Panel w/o Chol/HDL Ratio   HgB A1c   Poorly controlled type 2 diabetes mellitus ([place]) - Primary    Chronic, ongoing with poor adherence at times to diet which we are working on.  A1C last visit trend down last visit to 7.7%, will recheck today.   Rybelsus started in February 2022 -- we trialed increase to 14 MG but she feels this dropped her too low.  Urine ALB 30 last visit, continue Lotensin and recheck today.  Continue Metformin 1000 M BID and Rybelsus 7 MG daily.  Continue diet changes at home.  Highly recommend she monitor BS daily at home and document daily with fasting BS goal  of <130.  Will have return in 3 month for follow-up, plan to further increase to 14 MG if tolerating Rybelsus and not at goal with A1c.  Due  to age would prefer to avoid return to Glipizide and prefer to avoid insulin -- discussed with her at length.        Relevant Medications   atorvastatin (LIPITOR) 10 MG tablet   metFORMIN (GLUCOPHAGE) 1000 MG tablet   Semaglutide (RYBELSUS) 7 MG TABS   Other Relevant Orders   HgB A1c     Musculoskeletal and Integument   Osteoarthritis of left knee    Chronic, ongoing, with joint space loss on imaging.  At this time recommend: - Meloxicam 7.5 MG daily by mouth as needed only.  Stop Aleeve or Ibuprofen. - Tylenol 1000 MG TID as needed - Wear knee support daily - Apply ice to medial aspect left knee for 20 minutes 4 times a day - Rest knee when possible Return to office in 3 months, if ongoing may consider knee injection (is diabetic).        Other   B12 deficiency    Noted low normal levels on labs in past.  Recommend she continue taking Vitamin B12 500 to 1000 MCG daily.  Recheck level today.  Explained to patient the benefit of annual B12 level testing while on daily Metformin.  Educated patient on the effect of Metformin on absorption of Vitamin B12 and symptoms of low B12 level, such as neuropathic pain.  "Reduced absorption of vitamin B12 is a known adverse effect of long-term use of Metformin" and "routine B12 monitoring may be considered in patients with poor dietary intake or absorption" Pollie Meyer, D. J., 2019 via UpToDate).         Relevant Orders   Vitamin B12   Elevated liver enzymes    Check CMP today and recommend focus on diet changes.      Relevant Orders   Comprehensive metabolic panel     Follow up plan: Return in about 3 months (around 05/01/2021) for T2DM, HTN/HLD, KNEE PAIN.

## 2021-02-01 ENCOUNTER — Other Ambulatory Visit: Payer: Self-pay | Admitting: Nurse Practitioner

## 2021-02-01 LAB — COMPREHENSIVE METABOLIC PANEL
ALT: 27 IU/L (ref 0–32)
AST: 22 IU/L (ref 0–40)
Albumin/Globulin Ratio: 1.5 (ref 1.2–2.2)
Albumin: 4.4 g/dL (ref 3.8–4.8)
Alkaline Phosphatase: 49 IU/L (ref 44–121)
BUN/Creatinine Ratio: 17 (ref 12–28)
BUN: 12 mg/dL (ref 8–27)
Bilirubin Total: 0.3 mg/dL (ref 0.0–1.2)
CO2: 26 mmol/L (ref 20–29)
Calcium: 9.5 mg/dL (ref 8.7–10.3)
Chloride: 97 mmol/L (ref 96–106)
Creatinine, Ser: 0.69 mg/dL (ref 0.57–1.00)
Globulin, Total: 3 g/dL (ref 1.5–4.5)
Glucose: 130 mg/dL — ABNORMAL HIGH (ref 70–99)
Potassium: 4 mmol/L (ref 3.5–5.2)
Sodium: 138 mmol/L (ref 134–144)
Total Protein: 7.4 g/dL (ref 6.0–8.5)
eGFR: 93 mL/min/{1.73_m2} (ref >59)

## 2021-02-01 LAB — HEMOGLOBIN A1C
Est. average glucose Bld gHb Est-mCnc: 177 mg/dL
Hgb A1c MFr Bld: 7.8 % — ABNORMAL HIGH (ref 4.8–5.6)

## 2021-02-01 LAB — LIPID PANEL W/O CHOL/HDL RATIO
Cholesterol, Total: 153 mg/dL (ref 100–199)
HDL: 38 mg/dL — ABNORMAL LOW (ref >39)
LDL Chol Calc (NIH): 81 mg/dL (ref 0–99)
Triglycerides: 198 mg/dL — ABNORMAL HIGH (ref 0–149)
VLDL Cholesterol Cal: 34 mg/dL (ref 5–40)

## 2021-02-01 LAB — MICROALBUMIN / CREATININE URINE RATIO
Creatinine, Urine: 103.2 mg/dL
Microalb/Creat Ratio: 16 mg/g creat (ref 0–29)
Microalbumin, Urine: 16 ug/mL

## 2021-02-01 LAB — VITAMIN B12: Vitamin B-12: 522 pg/mL (ref 232–1245)

## 2021-02-01 MED ORDER — RYBELSUS 14 MG PO TABS: 14.0000 mg | ORAL_TABLET | Freq: Every day | ORAL | 4 refills | Status: DC

## 2021-02-01 NOTE — Progress Notes (Signed)
Good morning, please let Cheryl Mendez know her labs have returned.  Her B12 and cholesterol levels are stable.  Kidney and liver function are normal.  A1c has trended up to 7.8%, was 7.7% last visit.  I would like to try her going up on Rybelsus to 14 MG, we need to work on getting A1c <7%.  She can start taking 2 of her 7 MG Rybelsus and then pick up the 14 MG tablet I sent in.  Focus heavily on diet changes at home.  We will recheck next visit.  Any questions? Keep being awesome!!  Thank you for allowing me to participate in your care.  I appreciate you. Kindest regards, Klynn Linnemann

## 2021-02-02 ENCOUNTER — Other Ambulatory Visit: Payer: Self-pay | Admitting: Nurse Practitioner

## 2021-02-02 NOTE — Telephone Encounter (Signed)
Requested Prescriptions  Pending Prescriptions Disp Refills   loratadine (CLARITIN) 10 MG tablet [Pharmacy Med Name: LORATADINE 10MG  TABLETS] 90 tablet 4    Sig: TAKE 1 TABLET(10 MG) BY MOUTH DAILY     Ear, Nose, and Throat:  Antihistamines Passed - 02/02/2021  7:07 AM      Passed - Valid encounter within last 12 months    Recent Outpatient Visits          2 days ago Poorly controlled type 2 diabetes mellitus ([place])   [place] Cannady, Henrine Screws T, NP   1 month ago Primary osteoarthritis of left knee   [place], [place] T, NP   3 months ago Poorly controlled type 2 diabetes mellitus ([place])   [place], Jolene T, NP   7 months ago Poorly controlled type 2 diabetes mellitus ([place])   [place], Jolene T, NP   7 months ago Upper respiratory tract infection, unspecified type   [facility], NP      Future Appointments            In 2 months Cannady, Barbaraann Faster, NP [facility]           Signed Prescriptions Disp Refills   meloxicam (MOBIC) 7.5 MG tablet 30 tablet 2    Sig: TAKE 1 TABLET(7.5 MG) BY MOUTH DAILY     Analgesics:  COX2 Inhibitors Passed - 02/02/2021  7:07 AM      Passed - HGB in normal range and within 360 days    Hemoglobin  Date Value Ref Range Status  11/01/2020 13.6 11.1 - 15.9 g/dL Final         Passed - Cr in normal range and within 360 days    Creatinine  Date Value Ref Range Status  06/09/2014 0.73 mg/dL Final    Comment:    0.44-1.00 NOTE: New Reference Range  04/26/14    Creatinine, Ser  Date Value Ref Range Status  01/31/2021 0.69 0.57 - 1.00 mg/dL Final         Passed - Patient is not pregnant      Passed - Valid encounter within last 12 months    Recent Outpatient Visits          2 days ago Poorly controlled type 2 diabetes mellitus ([place])   [place] Cannady, Jolene T, NP   1 month ago Primary  osteoarthritis of left knee   [place], [place] T, NP   3 months ago Poorly controlled type 2 diabetes mellitus ([place])   [place], Jolene T, NP   7 months ago Poorly controlled type 2 diabetes mellitus ([place])   [place], Jolene T, NP   7 months ago Upper respiratory tract infection, unspecified type   [facility] [place], NP      Future Appointments            In 2 months Cannady, Barbaraann Faster, NP [facility]

## 2021-02-02 NOTE — Telephone Encounter (Signed)
Requested Prescriptions  Pending Prescriptions Disp Refills   loratadine (CLARITIN) 10 MG tablet [Pharmacy Med Name: LORATADINE 10MG  TABLETS] 90 tablet 4    Sig: TAKE 1 TABLET(10 MG) BY MOUTH DAILY     Ear, Nose, and Throat:  Antihistamines Passed - 02/02/2021  7:07 AM      Passed - Valid encounter within last 12 months    Recent Outpatient Visits          2 days ago Poorly controlled type 2 diabetes mellitus ([place])   [place] Cannady, Henrine Screws T, NP   1 month ago Primary osteoarthritis of left knee   [place], [place] T, NP   3 months ago Poorly controlled type 2 diabetes mellitus ([place])   [place], Jolene T, NP   7 months ago Poorly controlled type 2 diabetes mellitus ([place])   [place], Jolene T, NP   7 months ago Upper respiratory tract infection, unspecified type   [facility], NP      Future Appointments            In 2 months Cannady, Barbaraann Faster, NP [facility]            meloxicam (MOBIC) 7.5 MG tablet [Pharmacy Med Name: MELOXICAM 7.5MG  TABLETS] 30 tablet 2    Sig: TAKE 1 TABLET(7.5 MG) BY MOUTH DAILY     Analgesics:  COX2 Inhibitors Passed - 02/02/2021  7:07 AM      Passed - HGB in normal range and within 360 days    Hemoglobin  Date Value Ref Range Status  11/01/2020 13.6 11.1 - 15.9 g/dL Final         Passed - Cr in normal range and within 360 days    Creatinine  Date Value Ref Range Status  06/09/2014 0.73 mg/dL Final    Comment:    0.44-1.00 NOTE: New Reference Range  04/26/14    Creatinine, Ser  Date Value Ref Range Status  01/31/2021 0.69 0.57 - 1.00 mg/dL Final         Passed - Patient is not pregnant      Passed - Valid encounter within last 12 months    Recent Outpatient Visits          2 days ago Poorly controlled type 2 diabetes mellitus ([place])   [place] Cannady, Jolene T, NP   1 month ago  Primary osteoarthritis of left knee   [place], [place] T, NP   3 months ago Poorly controlled type 2 diabetes mellitus ([place])   [place], Jolene T, NP   7 months ago Poorly controlled type 2 diabetes mellitus ([place])   [place], Jolene T, NP   7 months ago Upper respiratory tract infection, unspecified type   [facility] [place], NP      Future Appointments            In 2 months Cannady, Barbaraann Faster, NP [facility]

## 2021-02-09 ENCOUNTER — Telehealth: Payer: Self-pay | Admitting: Nurse Practitioner

## 2021-02-09 NOTE — Telephone Encounter (Signed)
Copied from [place] [id number]. Topic: Appointment Scheduling - Scheduling Inquiry for Clinic >> Feb 09, 2021 12:44 PM Pawlus, Brayton Layman A wrote: Reason for CRM: Pt stated she was told to come over to the practice if she needed a shot in her knee, please advise. Pt wanted to come in today if possible.

## 2021-02-15 ENCOUNTER — Telehealth: Payer: Self-pay | Admitting: Nurse Practitioner

## 2021-02-15 NOTE — Telephone Encounter (Signed)
Cheryl Mendez states that Cheryl Mendez told Cheryl Mendez she could come in whenever to get the shot. I let Cheryl Mendez know she would need an appt and told Cheryl Mendez that Cheryl Mendez next appointment is 02/23/2021. She asked if I could message Cheryl Mendez and ask Cheryl Mendez if she could work Cheryl Mendez in. Let Cheryl Mendez know that Cheryl Mendez is out of the office this afternoon but I would ask if she can come in tomorrow. Please advise  Copied from [place] [phone]. Topic: General - Other >> Feb 15, 2021 12:35 PM Cheryl Mendez, Cheryl Mendez wrote: Reason for CRM: Cheryl Mendez called in for assistance. Cheryl Mendez says that she would like to get a cortisone shot.  Cheryl Mendez says that she was seen by provider and told to reach out to Cheryl Mendez if she decides to get it. Cheryl Mendez would like to get the shot today if possible.   Please assist Cheryl Mendez further.

## 2021-02-16 NOTE — Telephone Encounter (Signed)
Attempted to call pt to let her know what Jolene said. Unable to leave a vm due to a full mailbox

## 2021-02-20 ENCOUNTER — Encounter: Payer: Self-pay | Admitting: Nurse Practitioner

## 2021-02-20 ENCOUNTER — Ambulatory Visit (INDEPENDENT_AMBULATORY_CARE_PROVIDER_SITE_OTHER): Payer: Medicare Other | Admitting: Nurse Practitioner

## 2021-02-20 ENCOUNTER — Other Ambulatory Visit: Payer: Self-pay

## 2021-02-20 DIAGNOSIS — M1712 Unilateral primary osteoarthritis, left knee: Secondary | ICD-10-CM

## 2021-02-20 NOTE — Patient Instructions (Signed)

## 2021-02-20 NOTE — Telephone Encounter (Signed)
Noted  

## 2021-02-20 NOTE — Assessment & Plan Note (Signed)
Chronic, ongoing, with joint space loss on imaging.  At this time steroid injection given in lateral aspect in office and recommend: - Meloxicam 7.5 MG daily by mouth as needed only.  Stop Aleeve or Ibuprofen. - Tylenol 1000 MG TID as needed - Wear knee support daily - Apply ice to medial aspect left knee for 20 minutes 4 times a day Return to office for worsening or ongoing symptoms.

## 2021-02-20 NOTE — Progress Notes (Signed)
BP 132/88    Pulse 88 Comment: apical   Temp 99.4 F (37.4 C) (Oral)    Wt 174 lb 6.4 oz (79.1 kg)    LMP  (LMP Unknown) Comment: age 71   SpO2 98%    BMI 28.78 kg/m    Subjective:    Patient ID: Cheryl Mendez, female    DOB: 09/10/1950, 71 y.o.   MRN: [id number]  HPI: Cheryl Mendez is a 71 y.o. female  Chief Complaint  Patient presents with   Knee Pain    L knee injection   KNEE PAIN (LEFT) Has been seen at [facility] for this in past, as was taking her neighbor there for a sprained ankle.  Had imaging -- noted to have significant joint space loss, especially to medial aspect.  She reports provider did not want to do joint injection that day and recommended support and rest, however that she could return for injection if no improvement in pain.  Taking Meloxicam and Aleeve at present -- but continues to have ongoing pain.    Pain is getting worse, making it hard for her to sleep.  Using Icy/Hot and brace. Duration: chronic Involved knee: left Mechanism of injury: unknown Location:medial Onset: gradual Severity: 9/10 at worst and at best 2/10 Quality:  dull, aching, and throbbing Frequency: intermittent Radiation: no Aggravating factors: weight bearing, walking, bending, and movement -- walking straight does not bother her as bad, as going down hill Alleviating factors:  Aleeve, NSAIDs, and rest  Status: worse Treatments attempted: Aleeve, NSAIDs, and rest   Relief with NSAIDs?:  moderate Weakness with weight bearing or walking: yes Sensation of giving way: yes Locking: none Popping: yes Bruising: no Swelling: yes Redness: no Paresthesias/decreased sensation: no Fevers: no   Relevant past medical, surgical, family and social history reviewed and updated as indicated. Interim medical history since our last visit reviewed. Allergies and medications reviewed and updated.  Review of Systems  Constitutional:  Negative for activity change, appetite change, diaphoresis,  fatigue and fever.  HENT: Negative.    Respiratory:  Negative for cough, chest tightness, shortness of breath and wheezing.   Cardiovascular:  Negative for chest pain, palpitations and leg swelling.  Gastrointestinal: Negative.   Endocrine: Negative for cold intolerance, heat intolerance, polydipsia, polyphagia and polyuria.  Musculoskeletal:  Positive for arthralgias.  Neurological: Negative.   Psychiatric/Behavioral: Negative.     Per HPI unless specifically indicated above     Objective:    BP 132/88    Pulse 88 Comment: apical   Temp 99.4 F (37.4 C) (Oral)    Wt 174 lb 6.4 oz (79.1 kg)    LMP  (LMP Unknown) Comment: age 19   SpO2 98%    BMI 28.78 kg/m   Wt Readings from Last 3 Encounters:  02/20/21 174 lb 6.4 oz (79.1 kg)  01/31/21 174 lb (78.9 kg)  12/22/20 174 lb 9.6 oz (79.2 kg)    Physical Exam Vitals and nursing note reviewed.  Constitutional:      General: She is awake. She is not in acute distress.    Appearance: She is well-developed and overweight. She is not ill-appearing.  HENT:     Head: Normocephalic.     Right Ear: Hearing normal.     Left Ear: Hearing normal.  Eyes:     General: Lids are normal.        Right eye: No discharge.        Left eye: No discharge.  Conjunctiva/sclera: Conjunctivae normal.     Pupils: Pupils are equal, round, and reactive to light.  Neck:     Thyroid: No thyromegaly.     Vascular: No carotid bruit or JVD.  Cardiovascular:     Rate and Rhythm: Normal rate and regular rhythm.     Heart sounds: Normal heart sounds. No murmur heard.   No gallop.  Pulmonary:     Effort: Pulmonary effort is normal. No accessory muscle usage or respiratory distress.     Breath sounds: Normal breath sounds.  Abdominal:     General: Bowel sounds are normal.     Palpations: Abdomen is soft.  Musculoskeletal:     Cervical back: Normal range of motion and neck supple.     Right knee: Normal.     Left knee: Swelling (medial aspect) and crepitus  present. No erythema or bony tenderness. Decreased range of motion. Tenderness present over the medial joint line.     Right lower leg: No edema.     Left lower leg: No edema.  Lymphadenopathy:     Cervical: No cervical adenopathy.  Skin:    General: Skin is warm and dry.  Neurological:     Mental Status: She is alert and oriented to person, place, and time.     Motor: Motor function is intact.     Coordination: Coordination is intact.     Gait: Gait is intact.     Deep Tendon Reflexes: Reflexes are normal and symmetric.     Reflex Scores:      Brachioradialis reflexes are 2+ on the right side and 2+ on the left side.      Patellar reflexes are 2+ on the right side and 2+ on the left side. Psychiatric:        Attention and Perception: Attention normal.        Mood and Affect: Mood normal.        Behavior: Behavior normal. Behavior is cooperative.        Thought Content: Thought content normal.        Judgment: Judgment normal.   STEROID INJECTION Procedure: Left Knee Intraarticular Steroid Injection  Description: After verbal consent and patient education on procedure provided, area prepped and draped using  semi-sterile technique. Using a lateral approach, a mixture of 5 cc of  1% Marcaine & 1 cc of Kenalog 40 was injected into knee joint.  A bandage was then placed over the injection site. Complications:  none Post Procedure Instructions: To the ER if any symptoms of erythema or swelling.   Follow Up: PRN   Results for orders placed or performed in visit on 01/31/21  Vitamin B12  Result Value Ref Range   Vitamin B-12 522 232 - 1,245 pg/mL  Lipid Panel w/o Chol/HDL Ratio  Result Value Ref Range   Cholesterol, Total 153 100 - 199 mg/dL   Triglycerides 198 (H) 0 - 149 mg/dL   HDL 38 (L) >39 mg/dL   VLDL Cholesterol Cal 34 5 - 40 mg/dL   LDL Chol Calc (NIH) 81 0 - 99 mg/dL  Comprehensive metabolic panel  Result Value Ref Range   Glucose 130 (H) 70 - 99 mg/dL   BUN 12 8 - 27  mg/dL   Creatinine, Ser 0.69 0.57 - 1.00 mg/dL   eGFR 93 >59 mL/min/1.73   BUN/Creatinine Ratio 17 12 - 28   Sodium 138 134 - 144 mmol/L   Potassium 4.0 3.5 - 5.2 mmol/L   Chloride 97  96 - 106 mmol/L   CO2 26 20 - 29 mmol/L   Calcium 9.5 8.7 - 10.3 mg/dL   Total Protein 7.4 6.0 - 8.5 g/dL   Albumin 4.4 3.8 - 4.8 g/dL   Globulin, Total 3.0 1.5 - 4.5 g/dL   Albumin/Globulin Ratio 1.5 1.2 - 2.2   Bilirubin Total 0.3 0.0 - 1.2 mg/dL   Alkaline Phosphatase 49 44 - 121 IU/L   AST 22 0 - 40 IU/L   ALT 27 0 - 32 IU/L  HgB A1c  Result Value Ref Range   Hgb A1c MFr Bld 7.8 (H) 4.8 - 5.6 %   Est. average glucose Bld gHb Est-mCnc 177 mg/dL  Urine Microalbumin w/creat. ratio  Result Value Ref Range   Creatinine, Urine 103.2 Not Estab. mg/dL   Microalbumin, Urine 16.0 Not Estab. ug/mL   Microalb/Creat Ratio 16 0 - 29 mg/g creat      Assessment & Plan:   Problem List Items Addressed This Visit       Musculoskeletal and Integument   Osteoarthritis of left knee    Chronic, ongoing, with joint space loss on imaging.  At this time steroid injection given in lateral aspect in office and recommend: - Meloxicam 7.5 MG daily by mouth as needed only.  Stop Aleeve or Ibuprofen. - Tylenol 1000 MG TID as needed - Wear knee support daily - Apply ice to medial aspect left knee for 20 minutes 4 times a day Return to office for worsening or ongoing symptoms.        Follow up plan: Return for as scheduled.

## 2021-02-20 NOTE — Telephone Encounter (Signed)
Pt scheduled for today at 2:40 pm

## 2021-03-04 ENCOUNTER — Other Ambulatory Visit: Payer: Self-pay | Admitting: Nurse Practitioner

## 2021-03-04 NOTE — Telephone Encounter (Signed)
last RF 01/31/21 #90 4 RF  Requested Prescriptions  Refused Prescriptions Disp Refills   omeprazole (PRILOSEC) 20 MG capsule [Pharmacy Med Name: OMEPRAZOLE 20MG  CAPSULES] 90 capsule 4    Sig: TAKE 1 CAPSULE(20 MG) BY MOUTH DAILY     Gastroenterology: Proton Pump Inhibitors Passed - 03/04/2021  7:13 AM      Passed - Valid encounter within last 12 months    Recent Outpatient Visits          1 week ago Primary osteoarthritis of left knee   [facility] [place], Jolene T, NP   1 month ago Poorly controlled type 2 diabetes mellitus ([place])   [place] Cannady, Jolene T, NP   2 months ago Primary osteoarthritis of left knee   [place], [place] T, NP   4 months ago Poorly controlled type 2 diabetes mellitus ([place])   [place] Cannady, Jolene T, NP   8 months ago Poorly controlled type 2 diabetes mellitus ([place])   [place], Barbaraann Faster, NP      Future Appointments            In 1 month Cannady, Barbaraann Faster, NP [facility]

## 2021-03-13 ENCOUNTER — Ambulatory Visit: Payer: Self-pay

## 2021-03-13 NOTE — Telephone Encounter (Signed)
°  Chief Complaint: advice Symptoms: nausea Frequency: unsure Pertinent Negatives:NA Disposition: [] ED /[] Urgent Care (no appt availability in office) / [] Appointment(In office/virtual)/ []  [place] [facility]/ [x] Home Care/ [] Refused Recommended Disposition /[] [place] [facility]/ []  Follow-up with PCP Additional Notes: pt had sister tell her how to perform covid test on husband so no assistance was needed  Summary: nausea   Patient states she feels nausea and not other symptoms. Patient unsure how to take the home COVID test/      Reason for Disposition  Health Information question, no triage required and triager able to answer question  Answer Assessment - Initial Assessment Questions 1. REASON FOR CALL or QUESTION: "What is your reason for calling today?" or "How can I best help you?" or "What question do you have that I can help answer?"     Needed help taking covid test  Protocols used: Information Only Call - No Triage-A-AH

## 2021-04-03 ENCOUNTER — Other Ambulatory Visit: Payer: Self-pay | Admitting: Nurse Practitioner

## 2021-04-03 NOTE — Telephone Encounter (Signed)
Requested medications are due for refill today.  unsure  Requested medications are on the active medications list.  no  Last refill. 03/28/2020  Future visit scheduled.   yes  Notes to clinic.  Medication was discontinued 01/31/2021. Medication not assigned a protocol.    Requested Prescriptions  Pending Prescriptions Disp Refills   RYBELSUS 7 MG TABS [Pharmacy Med Name: RYBELSUS 7MG  TABLETS] 30 tablet 12    Sig: TAKE 1 TABLET BY MOUTH DAILY. STOP 3MG  TABLET     Off-Protocol Failed - 04/03/2021  7:06 AM      Failed - Medication not assigned to a protocol, review manually.      Passed - Valid encounter within last 12 months    Recent Outpatient Visits           1 month ago Primary osteoarthritis of left knee   [facility] [place], [place] T, NP   2 months ago Poorly controlled type 2 diabetes mellitus ([place])   [place] Cannady, Henrine Screws T, NP   3 months ago Primary osteoarthritis of left knee   [place], [place] T, NP   5 months ago Poorly controlled type 2 diabetes mellitus ([place])   [place] Cannady, Jolene T, NP   9 months ago Poorly controlled type 2 diabetes mellitus ([place])   [place], Barbaraann Faster, NP       Future Appointments             In 4 weeks Cannady, Barbaraann Faster, NP [facility]

## 2021-04-19 ENCOUNTER — Other Ambulatory Visit: Payer: Self-pay | Admitting: Nurse Practitioner

## 2021-04-19 NOTE — Telephone Encounter (Signed)
LRF 01/31/21  #180  4 refills. Sent via Interface, refills available ?Requested Prescriptions  ?Pending Prescriptions Disp Refills  ?? metFORMIN (GLUCOPHAGE) 1000 MG tablet [Pharmacy Med Name: METFORMIN $RemoveBeforeD'1000MG'[id number]$  TABLETS] 180 tablet 4  ?  Sig: TAKE 1 TABLET BY MOUTH TWICE DAILY WITH FOOD  ?  ? Endocrinology:  Diabetes - Biguanides Passed - 04/19/2021  9:45 AM  ?  ?  Passed - Cr in normal range and within 360 days  ?  Creatinine  ?Date Value Ref Range Status  ?06/09/2014 0.73 mg/dL Final  ?  Comment:  ?  0.44-1.00 ?NOTE: New Reference Range ? 04/26/14 ?  ? ?Creatinine, Ser  ?Date Value Ref Range Status  ?01/31/2021 0.69 0.57 - 1.00 mg/dL Final  ?   ?  ?  Passed - HBA1C is between 0 and 7.9 and within 180 days  ?  Hemoglobin A1C  ?Date Value Ref Range Status  ?10/17/2015 7.4%  Final  ? ?HB A1C (BAYER DCA - WAIVED)  ?Date Value Ref Range Status  ?11/01/2020 7.7 (H) 4.8 - 5.6 % Final  ?  Comment:  ?           Prediabetes: 5.7 - 6.4 ?         Diabetes: >6.4 ?         Glycemic control for adults with diabetes: <7.0 ?              **Please note reference interval change** ?  ? ?Hgb A1c MFr Bld  ?Date Value Ref Range Status  ?01/31/2021 7.8 (H) 4.8 - 5.6 % Final  ?  Comment:  ?           Prediabetes: 5.7 - 6.4 ?         Diabetes: >6.4 ?         Glycemic control for adults with diabetes: <7.0 ?  ?   ?  ?  Passed - eGFR in normal range and within 360 days  ?  EGFR (African American)  ?Date Value Ref Range Status  ?06/09/2014 >60  Final  ? ?GFR calc Af Wyvonnia Lora  ?Date Value Ref Range Status  ?03/28/2020 103 >59 mL/min/1.73 Final  ?  Comment:  ?  **In accordance with recommendations from the NKF-ASN Task force,** ?  [facility] is in the process of updating its eGFR calculation to the ?  2021 CKD-EPI creatinine equation that estimates kidney function ?  without a race variable. ?  ? ?EGFR (Non-African Amer.)  ?Date Value Ref Range Status  ?06/09/2014 >60  Final  ?  Comment:  ?  eGFR values <63mL/min/1.73 m2 may be an indication of  chronic ?kidney disease (CKD). ?Calculated eGFR is useful in patients with stable renal function. ?The eGFR calculation will not be reliable in acutely ill patients ?when serum creatinine is changing rapidly. It is not useful in ?patients on dialysis. The eGFR calculation may not be applicable ?to patients at the low and high extremes of body sizes, pregnant ?women, and vegetarians. ?  ? ?GFR calc non Af Amer  ?Date Value Ref Range Status  ?03/28/2020 89 >59 mL/min/1.73 Final  ? ?eGFR  ?Date Value Ref Range Status  ?01/31/2021 93 >59 mL/min/1.73 Final  ?   ?  ?  Passed - B12 Level in normal range and within 720 days  ?  Vitamin B-12  ?Date Value Ref Range Status  ?01/31/2021 522 232 - 1,245 pg/mL Final  ?   ?  ?  Passed - Valid encounter  within last 6 months  ?  Recent Outpatient Visits   ?      ? 1 month ago Primary osteoarthritis of left knee  ? [place], [place] T, NP  ? 2 months ago Poorly controlled type 2 diabetes mellitus ([place])  ? [place], [place] T, NP  ? 3 months ago Primary osteoarthritis of left knee  ? [place], [place] T, NP  ? 5 months ago Poorly controlled type 2 diabetes mellitus ([place])  ? [facility] [place], [place] T, NP  ? 9 months ago Poorly controlled type 2 diabetes mellitus ([place])  ? [facility] [place], Henrine Screws T, NP  ?  ?  ?Future Appointments   ?        ? In 1 week Cannady, Barbaraann Faster, NP [facility]  ?  ? ?  ?  ?  Passed - CBC within normal limits and completed in the last 12 months  ?  WBC  ?Date Value Ref Range Status  ?11/01/2020 8.7 3.4 - 10.8 x10E3/uL Final  ?06/09/2014 9.8 3.6 - 11.0 x10 3/mm 3 Final  ? ?RBC  ?Date Value Ref Range Status  ?11/01/2020 4.69 3.77 - 5.28 x10E6/uL Final  ?06/09/2014 5.14 3.80 - 5.20 X10 6/mm 3 Final  ? ?Hemoglobin  ?Date Value Ref Range Status  ?11/01/2020 13.6 11.1 - 15.9 g/dL Final  ? ?Hematocrit  ?Date Value Ref Range Status  ?11/01/2020 40.0  34.0 - 46.6 % Final  ? ?MCHC  ?Date Value Ref Range Status  ?11/01/2020 34.0 31.5 - 35.7 g/dL Final  ?06/09/2014 33.6 32.0 - 36.0 g/dL Final  ? ?MCH  ?Date Value Ref Range Status  ?11/01/2020 29.0 26.6 - 33.0 pg Final  ?06/09/2014 29.6 26.0 - 34.0 pg Final  ? ?MCV  ?Date Value Ref Range Status  ?11/01/2020 85 79 - 97 fL Final  ?06/09/2014 88 80 - 100 fL Final  ? ?No results found for: PLTCOUNTKUC, LAB[facility]AT, [place] ?RDW  ?Date Value Ref Range Status  ?11/01/2020 12.6 11.7 - 15.4 % Final  ?06/09/2014 12.6 11.5 - 14.5 % Final  ? ?  ?  ?  ? ? ?

## 2021-04-29 NOTE — Patient Instructions (Signed)

## 2021-05-01 ENCOUNTER — Encounter: Payer: Self-pay | Admitting: Nurse Practitioner

## 2021-05-01 ENCOUNTER — Ambulatory Visit (INDEPENDENT_AMBULATORY_CARE_PROVIDER_SITE_OTHER): Payer: Medicare Other | Admitting: Nurse Practitioner

## 2021-05-01 ENCOUNTER — Other Ambulatory Visit: Payer: Self-pay

## 2021-05-01 VITALS — BP 108/74 | HR 88 | Temp 98.9°F | Ht 65.28 in | Wt 170.0 lb

## 2021-05-01 DIAGNOSIS — I152 Hypertension secondary to endocrine disorders: Secondary | ICD-10-CM

## 2021-05-01 DIAGNOSIS — E785 Hyperlipidemia, unspecified: Secondary | ICD-10-CM

## 2021-05-01 DIAGNOSIS — M1712 Unilateral primary osteoarthritis, left knee: Secondary | ICD-10-CM

## 2021-05-01 DIAGNOSIS — E559 Vitamin D deficiency, unspecified: Secondary | ICD-10-CM | POA: Diagnosis not present

## 2021-05-01 DIAGNOSIS — M7918 Myalgia, other site: Secondary | ICD-10-CM | POA: Insufficient documentation

## 2021-05-01 DIAGNOSIS — K219 Gastro-esophageal reflux disease without esophagitis: Secondary | ICD-10-CM

## 2021-05-01 DIAGNOSIS — E1169 Type 2 diabetes mellitus with other specified complication: Secondary | ICD-10-CM

## 2021-05-01 DIAGNOSIS — E1159 Type 2 diabetes mellitus with other circulatory complications: Secondary | ICD-10-CM

## 2021-05-01 DIAGNOSIS — E1165 Type 2 diabetes mellitus with hyperglycemia: Secondary | ICD-10-CM | POA: Diagnosis not present

## 2021-05-01 LAB — MICROALBUMIN, URINE WAIVED
Creatinine, Urine Waived: 200 mg/dL (ref 10–300)
Microalb, Ur Waived: 10 mg/L (ref 0–19)
Microalb/Creat Ratio: <30 mg/g (ref <30)

## 2021-05-01 LAB — BAYER DCA HB A1C WAIVED: HB A1C (BAYER DCA - WAIVED): 6.8 % — ABNORMAL HIGH (ref 4.8–5.6)

## 2021-05-01 MED ORDER — MELOXICAM 7.5 MG PO TABS: ORAL_TABLET | ORAL | 2 refills | Status: DC

## 2021-05-01 NOTE — Assessment & Plan Note (Addendum)
Patient states she fell last Thursday and is having left buttock pain which occasionally radiates upward to her lower back. States pain has improve but still remains at 5/10. Took 2 Meloxicam tablets she had left over, which provided some relief. Would like a refill to take as needed for the pain. She will take Aleve for pain and will take Meloxicam if Aleve does not help. Patient instructed not to take both together. She verbalize understanding.   To return for worsening or ongoing. ?

## 2021-05-01 NOTE — Assessment & Plan Note (Signed)
Chronic, ongoing.  Taking Atorvastatin '10mg'$  daily, patient tolerating this medication.  Continue current medication regimen and adjust as needed. Lipid panel today. ?

## 2021-05-01 NOTE — Assessment & Plan Note (Addendum)
Chronic, ongoing.  A1c was 7.8% on 01/31/21 decreased to 6.8% today. Patient was praised for this accomplishment. Urine ALB 30 last visit, decreased to 10 today. Continue Lotensin,  Metformin 1000 MG BID and Rybelsus 14 MG daily. Continue diet changes at home. Recommend to monitor BS daily at home and document daily with fasting BS goal of <130. ?

## 2021-05-01 NOTE — Assessment & Plan Note (Signed)
Chronic, ongoing, with joint space loss on imaging.  At this time recommend: ?- Meloxicam 7.5 MG daily by mouth as needed only.  Stop Aleeve or Ibuprofen. ?- Tylenol 1000 MG TID as needed ?- Wear knee support daily ?- Apply ice to medial aspect left knee for 20 minutes 4 times a day ?- Rest knee when possible ?05/01/21: Patient states she is not experiencing any knee pain.  ?

## 2021-05-01 NOTE — Progress Notes (Signed)
? ?BP 108/74   Pulse 88   Temp 98.9 ?F (37.2 ?C) (Oral)   Ht 5' 5.28" (1.658 m)   Wt 170 lb (77.1 kg)   LMP  (LMP Unknown) Comment: age 71  SpO2 96%   BMI 28.05 kg/m?   ? ?Subjective:  ? ? Patient ID: Cheryl Mendez, female    DOB: 06-09-1950, 71 y.o.   MRN: [id number] ? ?NOTE WRITTEN BY [employer] DNP STUDENT.  ASSESSMENT AND PLAN OF CARE REVIEWED WITH STUDENT, AGREE WITH ABOVE FINDINGS AND PLAN.  ? ?HPI: ?Cheryl Mendez is a 71 y.o. female ? ?Chief Complaint  ?Patient presents with  ? Diabetes  ? Hyperlipidemia  ? Hypertension  ? Knee Pain  ?  Patient states the knee pain has gotten better and she was wearing the brace. Patient states she has since had a fall last Thursday and hit and hurt buttock. Patient states she was taking Meloxicam from a previous prescription and says she was using Biofreeze and it is still sore.  ? Medication Refill  ?  Patient is requesting a refill on Meloxicam and Omeprazole.   ? ?She does not want colonoscopy + no mammogram or DEXA until next year. ? ?DIABETES ?Last A1c in December 7.8%.  Continues on Metformin 1000 MG BID and Rybelsus 7 MG daily, did trial an increase in past to 14 MG which made her feel shaky.  ? ?Previously has been on Trulicity, which caused mild GI issues. Glipizide taken in past, caused low sugars. Jardiance caused UTI frequently.   ?  ?Recent B12 level was 485 -- she is taking supplement at this time.   ?Hypoglycemic episodes:no ?Polydipsia/polyuria: no ?Visual disturbance: no ?Chest pain: no ?Paresthesias: no ?Glucose Monitoring: yes ? Accucheck frequency: weekly 2-3 ? Fasting glucose: ? Post prandial:140 ? Evening: ? Before meals: ?Taking Insulin?: no ? Long acting insulin: ? Short acting insulin: ?Blood Pressure Monitoring: weekly ?Retinal Examination: Up to Date ?Foot Exam: Up to Date ?Diabetic Education: Completed ?Pneumovax: Up to Date ?Influenza: Up to Date ?Aspirin: yes ?  ?HYPERTENSION / HYPERLIPIDEMIA ?Currently taking Lotensin 20-25 and Atorvastatin  10 MG. History of mild elevations periodically on LFTs, denies alcohol use. ?Satisfied with current treatment? yes ?Duration of hypertension: chronic ?BP monitoring frequency: not checking ?BP range:  ?BP medication side effects: no ?Past BP meds:  ?Duration of hyperlipidemia: chronic ?Cholesterol medication side effects: no ?Cholesterol supplements: none ?Past cholesterol medications:  ?Medication compliance: excellent compliance ?Aspirin: yes ?Recent stressors: no ?Recurrent headaches: no ?Visual changes: no ?Palpitations: no ?Dyspnea: no ?Chest pain: no ?Lower extremity edema: no ?Dizzy/lightheaded: no  ?.ascvd ? ?KNEE PAIN (LEFT) ?Provided steroid injection on 02/20/21.  Had imaging -- noted to have significant joint space loss, especially to medial aspect.  Pain has improved since steroid injection. ?Duration: chronic ?Involved knee: left ?Mechanism of injury:  ?Location: ?Onset:  ?Severity: improved ?Quality:  not currently having pain ?Frequency: None ?Radiation: no ?Aggravating factors: nothing  ?Status: better ?Treatments attempted:  steroid injection   ?Relief with NSAIDs?:   yes ?Weakness with weight bearing or walking:  none ?Sensation of giving way: no ?Locking: no ?Popping: no ?Bruising: no ?Swelling: no ?Redness: no ?Paresthesias/decreased sensation: no ?Fevers: no  ? ?Lower back/left buttock pain ?[place] last Thursday and is having left buttock pain 5/10, pain radiates up from left side of buttocks to lower back ? ?Alleviating factors: biofreeze and heating pain, meloxicam ?. ?Relevant past medical, surgical, family and social history reviewed and updated as indicated. Interim  medical history since our last visit reviewed. ?Allergies and medications reviewed and updated. ? ?Review of Systems  ?Constitutional:  Negative for activity change, appetite change, diaphoresis, fatigue and fever.  ?HENT: Negative.    ?Respiratory:  Negative for cough, chest tightness, shortness of breath and wheezing.    ?Cardiovascular:  Negative for chest pain, palpitations and leg swelling.  ?Gastrointestinal: Negative.   ?Endocrine: Negative for cold intolerance, heat intolerance, polydipsia, polyphagia and polyuria.  ?Musculoskeletal:  Positive for back pain.  ?Neurological: Negative.   ?Psychiatric/Behavioral: Negative.    ? ?Per HPI unless specifically indicated above ? ?   ?Objective:  ?  ?BP 108/74   Pulse 88   Temp 98.9 ?F (37.2 ?C) (Oral)   Ht 5' 5.28" (1.658 m)   Wt 170 lb (77.1 kg)   LMP  (LMP Unknown) Comment: age 54  SpO2 96%   BMI 28.05 kg/m?   ?Wt Readings from Last 3 Encounters:  ?05/01/21 170 lb (77.1 kg)  ?02/20/21 174 lb 6.4 oz (79.1 kg)  ?01/31/21 174 lb (78.9 kg)  ?  ?Physical Exam ?Vitals and nursing note reviewed.  ?Constitutional:   ?   General: She is awake. She is not in acute distress. ?   Appearance: She is well-developed and overweight. She is not ill-appearing.  ?HENT:  ?   Head: Normocephalic.  ?   Right Ear: Hearing normal.  ?   Left Ear: Hearing normal.  ?Eyes:  ?   General: Lids are normal.     ?   Right eye: No discharge.     ?   Left eye: No discharge.  ?   Conjunctiva/sclera: Conjunctivae normal.  ?   Pupils: Pupils are equal, round, and reactive to light.  ?Neck:  ?   Thyroid: No thyromegaly or thyroid tenderness.  ?   Vascular: No carotid bruit or JVD.  ?Cardiovascular:  ?   Rate and Rhythm: Regular rhythm. Tachycardia present.  ?   Heart sounds: Normal heart sounds. No murmur heard. ?  No gallop.  ?Pulmonary:  ?   Effort: Pulmonary effort is normal. No accessory muscle usage or respiratory distress.  ?   Breath sounds: Normal breath sounds. No wheezing or rhonchi.  ?Abdominal:  ?   General: Bowel sounds are normal.  ?   Palpations: Abdomen is soft.  ?Musculoskeletal:  ?   Cervical back: Normal range of motion and neck supple.  ?   Lumbar back: Tenderness present. No swelling, edema or signs of trauma.  ?   Right knee: Normal.  ?   Left knee: No swelling (medial aspect), erythema,  bony tenderness or crepitus. Normal range of motion. No tenderness. No medial joint line tenderness.  ?   Right lower leg: No edema.  ?   Left lower leg: No edema.  ?Lymphadenopathy:  ?   Cervical: No cervical adenopathy.  ?Skin: ?   General: Skin is warm and dry.  ?   Findings: No abrasion, bruising or erythema.  ?Neurological:  ?   Mental Status: She is alert and oriented to person, place, and time.  ?   Motor: Motor function is intact.  ?   Coordination: Coordination is intact.  ?   Gait: Gait is intact.  ?   Deep Tendon Reflexes: Reflexes are normal and symmetric.  ?   Reflex Scores: ?     Brachioradialis reflexes are 2+ on the right side and 2+ on the left side. ?     Patellar reflexes are  2+ on the right side and 2+ on the left side. ?Psychiatric:     ?   Attention and Perception: Attention normal.     ?   Mood and Affect: Mood normal.     ?   Behavior: Behavior normal. Behavior is cooperative.     ?   Thought Content: Thought content normal.     ?   Judgment: Judgment normal.  ? ?Results for orders placed or performed in visit on 05/01/21  ?Bayer DCA Hb A1c Waived  ?Result Value Ref Range  ? HB A1C (BAYER DCA - WAIVED) 6.8 (H) 4.8 - 5.6 %  ?Microalbumin, Urine Waived  ?Result Value Ref Range  ? Microalb, Ur Waived 10 0 - 19 mg/L  ? Creatinine, Urine Waived 200 10 - 300 mg/dL  ? Microalb/Creat Ratio <30 <30 mg/g  ? ?   ?Assessment & Plan:  ? ?Problem List Items Addressed This Visit   ? ?  ? Cardiovascular and Mediastinum  ? Hypertension associated with diabetes ([place])  ?  Chronic, stable with BP below goal in office today 108/74.  Continue current medication regimen and adjust as needed -- Lotensin offering kidney protection.  Recommend checking BP at home three mornings a week and documenting for provider review. Instructed to notify provider if SBP<100.  CMP and TSH today.   ?  ?  ? Relevant Orders  ? Bayer DCA Hb A1c Waived (Completed)  ? Microalbumin, Urine Waived (Completed)  ? Comprehensive metabolic  panel  ? TSH  ?  ? Endocrine  ? Hyperlipidemia associated with type 2 diabetes mellitus ([place])  ?  Chronic, ongoing.  Taking Atorvastatin '10mg'$  daily, patient tolerating this medication.  Continue current medication regimen and adjust

## 2021-05-01 NOTE — Assessment & Plan Note (Addendum)
Chronic, stable with BP below goal in office today 108/74.  Continue current medication regimen and adjust as needed -- Lotensin offering kidney protection.  Recommend checking BP at home three mornings a week and documenting for provider review. Instructed to notify provider if SBP<100.  CMP and TSH today.   ?

## 2021-05-02 LAB — COMPREHENSIVE METABOLIC PANEL
ALT: 22 IU/L (ref 0–32)
AST: 21 IU/L (ref 0–40)
Albumin/Globulin Ratio: 1.7 (ref 1.2–2.2)
Albumin: 4.6 g/dL (ref 3.8–4.8)
Alkaline Phosphatase: 45 IU/L (ref 44–121)
BUN/Creatinine Ratio: 24 (ref 12–28)
BUN: 18 mg/dL (ref 8–27)
Bilirubin Total: 0.4 mg/dL (ref 0.0–1.2)
CO2: 26 mmol/L (ref 20–29)
Calcium: 10.3 mg/dL (ref 8.7–10.3)
Chloride: 95 mmol/L — ABNORMAL LOW (ref 96–106)
Creatinine, Ser: 0.76 mg/dL (ref 0.57–1.00)
Globulin, Total: 2.7 g/dL (ref 1.5–4.5)
Glucose: 107 mg/dL — ABNORMAL HIGH (ref 70–99)
Potassium: 4.2 mmol/L (ref 3.5–5.2)
Sodium: 138 mmol/L (ref 134–144)
Total Protein: 7.3 g/dL (ref 6.0–8.5)
eGFR: 84 mL/min/{1.73_m2} (ref >59)

## 2021-05-02 LAB — LIPID PANEL W/O CHOL/HDL RATIO
Cholesterol, Total: 183 mg/dL (ref 100–199)
HDL: 44 mg/dL (ref >39)
LDL Chol Calc (NIH): 96 mg/dL (ref 0–99)
Triglycerides: 253 mg/dL — ABNORMAL HIGH (ref 0–149)
VLDL Cholesterol Cal: 43 mg/dL — ABNORMAL HIGH (ref 5–40)

## 2021-05-02 LAB — VITAMIN D 25 HYDROXY (VIT D DEFICIENCY, FRACTURES): Vit D, 25-Hydroxy: 22.8 ng/mL — ABNORMAL LOW (ref 30.0–100.0)

## 2021-05-02 LAB — TSH: TSH: 1.7 u[IU]/mL (ref 0.450–4.500)

## 2021-05-02 NOTE — Progress Notes (Signed)
Good morning crew, please let Beth know labs have returned: ?- Vitamin D level is a little low.  I want you to start taking Vitamin D3 2000 units daily for bone health, this can be obtained in drug store in vitamin section. ?- Kidney function, creatinine and eGFR, remains normal, as is liver function, AST and ALT.  ?- Thyroid is normal ?- Cholesterol levels stable with exception of ongoing elevation in triglycerides, which we will discuss next visit.  Any questions? ?Keep being amazing!!  Thank you for allowing me to participate in your care.  I appreciate you. ?Kindest regards, ?Latrease Kunde ?

## 2021-05-07 ENCOUNTER — Ambulatory Visit: Payer: Self-pay

## 2021-05-07 NOTE — Telephone Encounter (Signed)
?  Chief Complaint: abdominal pain ?Symptoms: abdominal pain that comes and goes but gotten worse  ?Frequency: 1 week or more ?Pertinent Negatives: Patient denies diarrhea or constipation ?Disposition: '[]'$ ED /'[]'$ Urgent Care (no appt availability in office) / '[x]'$ Appointment(In office/virtual)/ '[]'$  [facility]/ '[]'$ Home Care/ '[]'$ Refused Recommended Disposition /'[]'$ [facility]/ '[]'$  Follow-up with PCP ?Additional Notes: pt states she feels similar to when she had bacterial infection that this isn't a bad food pain or needing to use bathroom that pain has gotten worse and went to drugstore and bought some MOM wanted to try that before she made an appt. I advised her lets go ahead and schedule and if needed to cancel she can. Pt agreed.  ? ?Summary: pt thinks has a baterial infection in stomach as had before, wants Jolene's nurse  ? Pt wants to know if Jolene would call her in something for a bacterial infection in her stomach like she has had in the past. She states that she is nauseated and her stomach hurts really bad, offered an appt but was unsure if wanted appt but kept talking about how bad her stomach hurt. Reach out to pt at [phone] as told her one of her nurses would reach out, preferred to see what Jolene thought. [phone]   ?  ? ?Reason for Disposition ? [1] MODERATE pain (e.g., interferes with normal activities) AND [2] pain comes and goes (cramps) AND [3] present > 24 hours  (Exception: pain with Vomiting or Diarrhea - see that Guideline) ? ?Answer Assessment - Initial Assessment Questions ?1. LOCATION: "Where does it hurt?"  ?    Stomach area ?3. ONSET: "When did the pain begin?" (e.g., minutes, hours or days ago)  ?    Since last week ?5. PATTERN "Does the pain come and go, or is it constant?" ?   - If constant: "Is it getting better, staying the same, or worsening?"  ?    (Note: Constant means the pain never goes away completely; most serious pain is constant and it progresses)   ?   - If intermittent: "How long does it last?" "Do you have pain now?" ?    (Note: Intermittent means the pain goes away completely between bouts) ?    Constant for most part ?6. SEVERITY: "How bad is the pain?"  (e.g., Scale 1-10; mild, moderate, or severe) ?  - MILD (1-3): doesn't interfere with normal activities, abdomen soft and not tender to touch  ?  - MODERATE (4-7): interferes with normal activities or awakens from sleep, abdomen tender to touch  ?  - SEVERE (8-10): excruciating pain, doubled over, unable to do any normal activities  ?    moderate ?7. RECURRENT SYMPTOM: "Have you ever had this type of stomach pain before?" If Yes, ask: "When was the last time?" and "What happened that time?"  ?    Yes similar when had bacterial stomach infection ?10. OTHER SYMPTOMS: "Do you have any other symptoms?" (e.g., back pain, diarrhea, fever, urination pain, vomiting) ?      No ? ?Protocols used: Abdominal Pain - Female-A-AH ? ?

## 2021-05-09 ENCOUNTER — Ambulatory Visit (INDEPENDENT_AMBULATORY_CARE_PROVIDER_SITE_OTHER): Payer: Medicare Other | Admitting: Nurse Practitioner

## 2021-05-09 ENCOUNTER — Other Ambulatory Visit: Payer: Self-pay

## 2021-05-09 ENCOUNTER — Encounter: Payer: Self-pay | Admitting: Nurse Practitioner

## 2021-05-09 VITALS — BP 93/66 | HR 86 | Temp 98.9°F | Ht 65.28 in | Wt 169.4 lb

## 2021-05-09 DIAGNOSIS — K219 Gastro-esophageal reflux disease without esophagitis: Secondary | ICD-10-CM | POA: Diagnosis not present

## 2021-05-09 DIAGNOSIS — E1165 Type 2 diabetes mellitus with hyperglycemia: Secondary | ICD-10-CM

## 2021-05-09 DIAGNOSIS — R1011 Right upper quadrant pain: Secondary | ICD-10-CM | POA: Diagnosis not present

## 2021-05-09 MED ORDER — OMEPRAZOLE 40 MG PO CPDR: 40.0000 mg | DELAYED_RELEASE_CAPSULE | Freq: Every day | ORAL | 4 refills | Status: DC

## 2021-05-09 MED ORDER — RYBELSUS 7 MG PO TABS: 7.0000 mg | ORAL_TABLET | Freq: Every day | ORAL | 12 refills | Status: DC

## 2021-05-09 NOTE — Assessment & Plan Note (Signed)
Refer to diabetes plan of care -- suspect Rybelsus causing side effects -- check Lipase, Amylase, and CMP today. ?

## 2021-05-09 NOTE — Patient Instructions (Signed)
Abdominal Pain, Adult Many things can cause belly (abdominal) pain. Most times, belly pain is not dangerous. Many cases of belly pain can be watched and treated at home. Sometimes, though, belly pain is serious. Yourdoctor will try to find the cause of your belly pain. Follow these instructions at home:  Medicines Take over-the-counter and prescription medicines only as told by your doctor. Do not take medicines that help you poop (laxatives) unless told by your doctor. General instructions Watch your belly pain for any changes. Drink enough fluid to keep your pee (urine) pale yellow. Keep all follow-up visits as told by your doctor. This is important. Contact a doctor if: Your belly pain changes or gets worse. You are not hungry, or you lose weight without trying. You are having trouble pooping (constipated) or have watery poop (diarrhea) for more than 2-3 days. You have pain when you pee or poop. Your belly pain wakes you up at night. Your pain gets worse with meals, after eating, or with certain foods. You are vomiting and cannot keep anything down. You have a fever. You have blood in your pee. Get help right away if: Your pain does not go away as soon as your doctor says it should. You cannot stop vomiting. Your pain is only in areas of your belly, such as the right side or the left lower part of the belly. You have bloody or black poop, or poop that looks like tar. You have very bad pain, cramping, or bloating in your belly. You have signs of not having enough fluid or water in your body (dehydration), such as: Dark pee, very little pee, or no pee. Cracked lips. Dry mouth. Sunken eyes. Sleepiness. Weakness. You have trouble breathing or chest pain. Summary Many cases of belly pain can be watched and treated at home. Watch your belly pain for any changes. Take over-the-counter and prescription medicines only as told by your doctor. Contact a doctor if your belly pain  changes or gets worse. Get help right away if you have very bad pain, cramping, or bloating in your belly. This information is not intended to replace advice given to you by your health care provider. Make sure you discuss any questions you have with your healthcare provider. Document Revised: 06/15/2018 Document Reviewed: 06/15/2018 Elsevier Patient Education  2022 [employer]  

## 2021-05-09 NOTE — Progress Notes (Signed)
? ?BP 93/66   Pulse 86 Comment: apical  Temp 98.9 ?F (37.2 ?C) (Oral)   Ht 5' 5.28" (1.658 m)   Wt 169 lb 6.4 oz (76.8 kg)   LMP  (LMP Unknown) Comment: age 71  SpO2 100%   BMI 27.95 kg/m?   ? ?Subjective:  ? ? Patient ID: Cheryl Mendez, female    DOB: 04/09/1950, 71 y.o.   MRN: [id number] ? ?HPI: ?Cheryl Mendez is a 71 y.o. female ? ?Chief Complaint  ?Patient presents with  ? Medication Problem  ?  Patient states every times she takes her Ryebelus she has abdominal pain located in the upper area and side of her abdomen. Patient states it makes her full and irritated in her upper abdomen area. Patient states the Omeprazole was helping before she started the new medication and want to discuss with provider.   ? ?DIABETES ?Recent A1c on 05/01/21 with level 6.8%, much improved with consistent use of Rybelsus 14 MG daily and she also continues on Metformin 1000 MG BID.  In past has taken Trulicity with GI issues and Invokana stopped due to foot concerns + Jardiance made her sick. ? ?She reports having abdominal pain to epigastric area and at times to RUQ with Rybelsus 14 MG dosing.  Getting some nausea with this.  Is aware that it causes her not to eat as much, but if eats larger meal causes discomfort.  Continues Omeprazole 20 MG daily, which she feels is not helping -- tried Mylanta recently as well. ?Hypoglycemic episodes:no ?Polydipsia/polyuria: no ?Visual disturbance: no ?Chest pain: no ?Paresthesias: no ?Glucose Monitoring: no ? Accucheck frequency: Not Checking ? Fasting glucose: ? Post prandial: ? Evening: ? Before meals: ?Taking Insulin?: no ? Long acting insulin: ? Short acting insulin: ?Blood Pressure Monitoring: not checking ?Retinal Examination: Up to Date ?Foot Exam: Up to Date ?Pneumovax: Up to Date ?Influenza: Up to Date ?Aspirin: no  ? ?Relevant past medical, surgical, family and social history reviewed and updated as indicated. Interim medical history since our last visit reviewed. ?Allergies  and medications reviewed and updated. ? ?Review of Systems  ?Constitutional:  Negative for activity change, appetite change, diaphoresis, fatigue and fever.  ?HENT: Negative.    ?Respiratory:  Negative for cough, chest tightness, shortness of breath and wheezing.   ?Cardiovascular:  Negative for chest pain, palpitations and leg swelling.  ?Gastrointestinal:  Positive for abdominal pain and nausea. Negative for abdominal distention, constipation, diarrhea and vomiting.  ?Endocrine: Negative for cold intolerance, heat intolerance, polydipsia, polyphagia and polyuria.  ?Neurological: Negative.   ?Psychiatric/Behavioral: Negative.    ? ?Per HPI unless specifically indicated above ? ?   ?Objective:  ?  ?BP 93/66   Pulse 86 Comment: apical  Temp 98.9 ?F (37.2 ?C) (Oral)   Ht 5' 5.28" (1.658 m)   Wt 169 lb 6.4 oz (76.8 kg)   LMP  (LMP Unknown) Comment: age 80  SpO2 100%   BMI 27.95 kg/m?   ?Wt Readings from Last 3 Encounters:  ?05/09/21 169 lb 6.4 oz (76.8 kg)  ?05/01/21 170 lb (77.1 kg)  ?02/20/21 174 lb 6.4 oz (79.1 kg)  ?  ?Physical Exam ?Vitals and nursing note reviewed.  ?Constitutional:   ?   General: She is awake. She is not in acute distress. ?   Appearance: She is well-developed and overweight. She is not ill-appearing.  ?HENT:  ?   Head: Normocephalic.  ?   Right Ear: Hearing normal.  ?   Left  Ear: Hearing normal.  ?Eyes:  ?   General: Lids are normal.     ?   Right eye: No discharge.     ?   Left eye: No discharge.  ?   Conjunctiva/sclera: Conjunctivae normal.  ?   Pupils: Pupils are equal, round, and reactive to light.  ?Neck:  ?   Thyroid: No thyromegaly.  ?   Vascular: No carotid bruit or JVD.  ?Cardiovascular:  ?   Rate and Rhythm: Normal rate and regular rhythm.  ?   Heart sounds: Normal heart sounds. No murmur heard. ?  No gallop.  ?Pulmonary:  ?   Effort: Pulmonary effort is normal. No accessory muscle usage or respiratory distress.  ?   Breath sounds: Normal breath sounds.  ?Abdominal:  ?    General: Bowel sounds are normal. There is no distension.  ?   Palpations: Abdomen is soft.  ?   Tenderness: There is abdominal tenderness in the epigastric area. There is no right CVA tenderness or left CVA tenderness. Negative signs include Murphy's sign and McBurney's sign.  ?   Hernia: No hernia is present.  ?Musculoskeletal:  ?   Cervical back: Normal range of motion and neck supple.  ?   Right lower leg: No edema.  ?   Left lower leg: No edema.  ?Lymphadenopathy:  ?   Cervical: No cervical adenopathy.  ?Skin: ?   General: Skin is warm and dry.  ?Neurological:  ?   Mental Status: She is alert and oriented to person, place, and time.  ?   Motor: Motor function is intact.  ?   Coordination: Coordination is intact.  ?   Gait: Gait is intact.  ?   Deep Tendon Reflexes: Reflexes are normal and symmetric.  ?   Reflex Scores: ?     Brachioradialis reflexes are 2+ on the right side and 2+ on the left side. ?     Patellar reflexes are 2+ on the right side and 2+ on the left side. ?Psychiatric:     ?   Attention and Perception: Attention normal.     ?   Mood and Affect: Mood normal.     ?   Behavior: Behavior normal. Behavior is cooperative.     ?   Thought Content: Thought content normal.     ?   Judgment: Judgment normal.  ? ?Results for orders placed or performed in visit on 05/01/21  ?Bayer DCA Hb A1c Waived  ?Result Value Ref Range  ? HB A1C (BAYER DCA - WAIVED) 6.8 (H) 4.8 - 5.6 %  ?Microalbumin, Urine Waived  ?Result Value Ref Range  ? Microalb, Ur Waived 10 0 - 19 mg/L  ? Creatinine, Urine Waived 200 10 - 300 mg/dL  ? Microalb/Creat Ratio <30 <30 mg/g  ?Comprehensive metabolic panel  ?Result Value Ref Range  ? Glucose 107 (H) 70 - 99 mg/dL  ? BUN 18 8 - 27 mg/dL  ? Creatinine, Ser 0.76 0.57 - 1.00 mg/dL  ? eGFR 84 >59 mL/min/1.73  ? BUN/Creatinine Ratio 24 12 - 28  ? Sodium 138 134 - 144 mmol/L  ? Potassium 4.2 3.5 - 5.2 mmol/L  ? Chloride 95 (L) 96 - 106 mmol/L  ? CO2 26 20 - 29 mmol/L  ? Calcium 10.3 8.7 - 10.3  mg/dL  ? Total Protein 7.3 6.0 - 8.5 g/dL  ? Albumin 4.6 3.8 - 4.8 g/dL  ? Globulin, Total 2.7 1.5 - 4.5 g/dL  ?  Albumin/Globulin Ratio 1.7 1.2 - 2.2  ? Bilirubin Total 0.4 0.0 - 1.2 mg/dL  ? Alkaline Phosphatase 45 44 - 121 IU/L  ? AST 21 0 - 40 IU/L  ? ALT 22 0 - 32 IU/L  ?Lipid Panel w/o Chol/HDL Ratio  ?Result Value Ref Range  ? Cholesterol, Total 183 100 - 199 mg/dL  ? Triglycerides 253 (H) 0 - 149 mg/dL  ? HDL 44 >39 mg/dL  ? VLDL Cholesterol Cal 43 (H) 5 - 40 mg/dL  ? LDL Chol Calc (NIH) 96 0 - 99 mg/dL  ?TSH  ?Result Value Ref Range  ? TSH 1.700 0.450 - 4.500 uIU/mL  ?VITAMIN D 25 Hydroxy (Vit-D Deficiency, Fractures)  ?Result Value Ref Range  ? Vit D, 25-Hydroxy 22.8 (L) 30.0 - 100.0 ng/mL  ? ?   ?Assessment & Plan:  ? ?Problem List Items Addressed This Visit   ? ?  ? Digestive  ? GERD (gastroesophageal reflux disease)  ?  Chronic, ongoing.  Increase Prilosec to 40 MG daily at this time and will reduce Rybelsus to 7 MG daily as suspect 14 MG is causing more side effects from GI standpoint.  Return in one week for follow-up. ?  ?  ? Relevant Medications  ? omeprazole (PRILOSEC) 40 MG capsule  ?  ? Endocrine  ? Poorly controlled type 2 diabetes mellitus ([place]) - Primary  ?  Chronic, ongoing.  A1c was 6.8% on recent visit. Patient was praised for this accomplishment. Urine ALB 30 last visit, decreased to 10 recent visit. Continue Lotensin for kidney protection + Metformin 1000 MG BID, but will reduce Rybelsus back to 7 MG daily which she tolerated better in past -- suspect this increase is cause of increase heart burn and symptoms.  Check CMP, lipase, and amylase today. Continue diet changes at home. Recommend to monitor BS daily at home and document daily with fasting BS goal of <130. ?  ?  ? Relevant Medications  ? Semaglutide (RYBELSUS) 7 MG TABS  ? Other Relevant Orders  ? Comprehensive metabolic panel  ?  ? Other  ? RUQ abdominal pain  ?  Refer to diabetes plan of care -- suspect Rybelsus causing side  effects -- check Lipase, Amylase, and CMP today. ?  ?  ? Relevant Orders  ? Comprehensive metabolic panel  ? Lipase  ? Amylase  ?  ? ?Follow up plan: ?Return in about 1 week (around 05/16/2021) for Abdominal pain. ? ? ? ? ? ?

## 2021-05-09 NOTE — Assessment & Plan Note (Signed)
Chronic, ongoing.  A1c was 6.8% on recent visit. Patient was praised for this accomplishment. Urine ALB 30 last visit, decreased to 10 recent visit. Continue Lotensin for kidney protection + Metformin 1000 MG BID, but will reduce Rybelsus back to 7 MG daily which she tolerated better in past -- suspect this increase is cause of increase heart burn and symptoms.  Check CMP, lipase, and amylase today. Continue diet changes at home. Recommend to monitor BS daily at home and document daily with fasting BS goal of <130. ?

## 2021-05-09 NOTE — Assessment & Plan Note (Signed)
Chronic, ongoing.  Increase Prilosec to 40 MG daily at this time and will reduce Rybelsus to 7 MG daily as suspect 14 MG is causing more side effects from GI standpoint.  Return in one week for follow-up. ?

## 2021-05-10 LAB — COMPREHENSIVE METABOLIC PANEL
ALT: 24 IU/L (ref 0–32)
AST: 25 IU/L (ref 0–40)
Albumin/Globulin Ratio: 1.7 (ref 1.2–2.2)
Albumin: 4.7 g/dL (ref 3.8–4.8)
Alkaline Phosphatase: 53 IU/L (ref 44–121)
BUN/Creatinine Ratio: 23 (ref 12–28)
BUN: 14 mg/dL (ref 8–27)
Bilirubin Total: 0.4 mg/dL (ref 0.0–1.2)
CO2: 26 mmol/L (ref 20–29)
Calcium: 10.3 mg/dL (ref 8.7–10.3)
Chloride: 94 mmol/L — ABNORMAL LOW (ref 96–106)
Creatinine, Ser: 0.61 mg/dL (ref 0.57–1.00)
Globulin, Total: 2.8 g/dL (ref 1.5–4.5)
Glucose: 119 mg/dL — ABNORMAL HIGH (ref 70–99)
Potassium: 3.9 mmol/L (ref 3.5–5.2)
Sodium: 139 mmol/L (ref 134–144)
Total Protein: 7.5 g/dL (ref 6.0–8.5)
eGFR: 96 mL/min/{1.73_m2} (ref >59)

## 2021-05-10 LAB — AMYLASE: Amylase: 54 U/L (ref 31–110)

## 2021-05-10 LAB — LIPASE: Lipase: 90 U/L — ABNORMAL HIGH (ref 14–72)

## 2021-05-10 NOTE — Progress Notes (Signed)
Good morning crew, please let Cheryl Mendez know her labs have returned.  Kidney function remains nice and normal.  Sugar level is at goal on labs.  Her pancreas labs are showing mild elevation in Lipase, but normal Amylase.  I suspect the higher dose of Rybelsus was causing some mild pancreatitis.  I would like her to reduce as we discussed to 7 MG daily (1/2 of her current 14 MG tablets).  She is scheduled for 11/01/21 for follow-up, I would like to see her back in office in 1 week please to recheck labs and see how she is feeling -- please schedule this.  Any questions?  Monitor diet closely, only bland foods and clear liquids until feeling better. ?Keep being amazing!!  Thank you for allowing me to participate in your care.  I appreciate you. ?Kindest regards, ?Cheryl Mendez ?

## 2021-05-19 NOTE — Patient Instructions (Signed)
Pancreatitis Eating Plan ?Pancreatitis is when your pancreas becomes irritated and swollen (inflamed). The pancreas is a small organ located behind your stomach. It helps your body digest food and regulate your blood sugar. Pancreatitis can affect how your body digests food, especially foods with fat. You may also have other symptoms such as abdominal pain or nausea. ?When you have pancreatitis, following a low-fat eating plan may help you manage symptoms and recover more quickly. Work with your health care provider or a diet and nutrition specialist (dietitian) to create an eating plan that is right for you. ?What are tips for following this plan? ?Reading food labels ?Use the information on food labels to help keep track of how much fat you eat: ?Check the serving size. ?Look for the amount of total fat in grams (g) in one serving. ?Low-fat foods have 3 g of fat or less per serving. ?Fat-free foods have 0.5 g of fat or less per serving. ?Keep track of how much fat you eat based on how many servings you eat. ?For example, if you eat two servings, the amount of fat you eat will be two times what is listed on the label. ?Shopping ? ?Buy low-fat or nonfat foods, such as: ?Fresh, frozen, or canned fruits and vegetables. ?Grains, including pasta, bread, and rice. ?Lean meat, poultry, fish, and other protein foods. ?Low-fat or nonfat dairy. ?Avoid buying bakery products and other sweets made with whole milk, butter, and eggs. ?Avoid buying snack foods with added fat, such as anything with butter or cheese flavoring. ?Cooking ?Remove skin from poultry, and remove extra fat from meat. ?Limit the amount of fat and oil you use to 6 teaspoons or less per day. ?Cook using low-fat methods, such as boiling, broiling, grilling, steaming, or baking. ?Use spray oil to cook. Add fat-free chicken broth to add flavor and moisture. ?Avoid adding cream to thicken soups or sauces. Use other thickeners such as corn starch or tomato  paste. ?Meal planning ? ?Eat a low-fat diet as told by your dietitian. For most people, this means having no more than 55-65 grams of fat each day. ?Eat small, frequent meals throughout the day. For example, you may have 5-6 small meals instead of 3 large meals. ?Drink enough fluid to keep your urine pale yellow. ?Do not drink alcohol. Talk to your health care provider if you need help stopping. ?Limit how much caffeine you have, including black coffee, black and green tea, caffeinated soft drinks, and energy drinks. ?General information ?Let your health care provider or dietitian know if you have unplanned weight loss on this eating plan. ?You may be instructed to follow a clear liquid diet during a flare of symptoms. Talk with your health care provider about how to manage your diet during symptoms of a flare. ?Take any vitamins or supplements as told by your health care provider. ?Work with a Microbiologist, especially if you have other conditions such as obesity or diabetes mellitus. ?What foods should I avoid? ?Fruits ?Fried fruits. Fruits served with butter or cream. ?Vegetables ?Fried vegetables. Vegetables cooked with butter, cheese, or cream. ?Grains ?Biscuits, waffles, donuts, pastries, and croissants. Pies and cookies. Butter-flavored popcorn. Regular crackers. ?Meats and other protein foods ?Fatty cuts of meat. Poultry with skin. Organ meats. Bacon, sausage, and cold cuts. Whole eggs. Nuts and nut butters. ?Dairy ?Whole and 2% milk. Whole milk yogurt. Whole milk ice cream. Cream and half-and-half. Cream cheese. Sour cream. Cheese. ?Beverages ?Wine, beer, and liquor. ?The items listed above may  not be a complete list of foods and beverages to avoid. Contact a dietitian for more information. ?Summary ?Pancreatitis can affect how your body digests food, especially foods with fat. ?When you have pancreatitis, it is recommended that you follow a low-fat eating plan to help you recover more quickly and manage  symptoms. For most people, this means limiting fat to no more than 55-65 grams per day. ?Do not drink alcohol. Limit the amount of caffeine you have, and drink enough fluid to keep your urine pale yellow. ?This information is not intended to replace advice given to you by your health care provider. Make sure you discuss any questions you have with your health care provider. ?Document Revised: 05/28/2018 Document Reviewed: 05/13/2017 ?Elsevier Patient Education ? [employer]. ? ?

## 2021-05-22 ENCOUNTER — Encounter: Payer: Self-pay | Admitting: Nurse Practitioner

## 2021-05-22 ENCOUNTER — Ambulatory Visit (INDEPENDENT_AMBULATORY_CARE_PROVIDER_SITE_OTHER): Payer: Medicare Other | Admitting: Nurse Practitioner

## 2021-05-22 VITALS — BP 110/72 | HR 99 | Temp 98.3°F | Ht 65.28 in | Wt 170.2 lb

## 2021-05-22 DIAGNOSIS — R748 Abnormal levels of other serum enzymes: Secondary | ICD-10-CM | POA: Insufficient documentation

## 2021-05-22 DIAGNOSIS — R1011 Right upper quadrant pain: Secondary | ICD-10-CM | POA: Diagnosis not present

## 2021-05-22 DIAGNOSIS — E1165 Type 2 diabetes mellitus with hyperglycemia: Secondary | ICD-10-CM | POA: Diagnosis not present

## 2021-05-22 NOTE — Assessment & Plan Note (Addendum)
Recheck labs today and focus on diet changes.  Consider imaging if ongoing elevations. ?

## 2021-05-22 NOTE — Assessment & Plan Note (Addendum)
Chronic, ongoing.  A1c was 6.8% on recent visit in March 2023. Patient was praised for this accomplishment. Urine ALB reduce to 27 April 2021. Continue Lotensin for kidney protection + Metformin 1000 MG BID.  Continue reduced Rybelsus 7 MG daily which she is tolerating better -- will recheck pancreas labs today and discussed with her may need to discontinued this.  Could consider Januvia.  Check CMP, lipase, and amylase today. Continue diet changes at home. Recommend to monitor BS daily at home and document daily with fasting BS goal of <130. ?

## 2021-05-22 NOTE — Assessment & Plan Note (Signed)
Concern for pancreatitis with Rybelsus at 14 MG dosing, improved with reduction in dosing.  Refer to diabetes plan of care. ?

## 2021-05-22 NOTE — Progress Notes (Signed)
? ?BP 110/72   Pulse 99   Temp 98.3 ?F (36.8 ?C) (Oral)   Ht 5' 5.28" (1.658 m)   Wt 170 lb 3.2 oz (77.2 kg)   LMP  (LMP Unknown) Comment: age 71  SpO2 97%   BMI 28.08 kg/m?   ? ?Subjective:  ? ? Patient ID: Cheryl Mendez, female    DOB: [birth year], 71 y.o.   MRN: [id number] ? ?HPI: ?Cheryl Mendez is a 71 y.o. female ? ?Chief Complaint  ?Patient presents with  ? Abdominal Pain  ? Diabetes  ? Pancreatitis  ?  Patient states she has still been experiencing pain, but improving. Patient states she is taking 1 capsule Prilosec in the morning and then in the evening. Patient states she is taking the 1/2 capsules of her 14 MG Rybelsus -- she is able to pick up her new prescription, 7 MG today.   ? ?DIABETES ?Recent A1c on 05/01/21 with level 6.8%, much improved with consistent use of Rybelsus 14 MG daily and she also continues on Metformin 1000 MG BID.  Pain started with Rybelsus 14 MG increase.  Recent visit cut back Rybelsus 7 MG due to abdominal pain and mild elevation in Lipase to 90, Amylase 54 -- she reports the pain is improving with reduction in Rybelsus, able to eat now. ? ?Increased Omeprazole to 40 MG, she has been taking her 10 MG tablets and finishing these -- taking twice.  Feels improvement with this.   ? ?In past has taken Trulicity with GI issues and Invokana stopped due to foot concerns + Jardiance made her sick. ?Hypoglycemic episodes:no ?Polydipsia/polyuria: no ?Visual disturbance: no ?Chest pain: no ?Paresthesias: no ?Glucose Monitoring: no ? Accucheck frequency: Not Checking ? Fasting glucose: ? Post prandial: ? Evening: ? Before meals: ?Taking Insulin?: no ? Long acting insulin: ? Short acting insulin: ?Blood Pressure Monitoring: not checking ?Retinal Examination: Up to Date ?Foot Exam: Up to Date ?Pneumovax: Up to Date ?Influenza: Up to Date ?Aspirin: no  ? ?Relevant past medical, surgical, family and social history reviewed and updated as indicated. Interim medical history since our last  visit reviewed. ?Allergies and medications reviewed and updated. ? ?Review of Systems  ?Constitutional:  Negative for activity change, appetite change, diaphoresis, fatigue and fever.  ?HENT: Negative.    ?Respiratory:  Negative for cough, chest tightness, shortness of breath and wheezing.   ?Cardiovascular:  Negative for chest pain, palpitations and leg swelling.  ?Gastrointestinal:  Positive for abdominal pain and nausea. Negative for abdominal distention, constipation, diarrhea and vomiting.  ?Endocrine: Negative for cold intolerance, heat intolerance, polydipsia, polyphagia and polyuria.  ?Neurological: Negative.   ?Psychiatric/Behavioral: Negative.    ? ?Per HPI unless specifically indicated above ? ?   ?Objective:  ?  ?BP 110/72   Pulse 99   Temp 98.3 ?F (36.8 ?C) (Oral)   Ht 5' 5.28" (1.658 m)   Wt 170 lb 3.2 oz (77.2 kg)   LMP  (LMP Unknown) Comment: age 71  SpO2 97%   BMI 28.08 kg/m?   ?Wt Readings from Last 3 Encounters:  ?05/22/21 170 lb 3.2 oz (77.2 kg)  ?05/09/21 169 lb 6.4 oz (76.8 kg)  ?05/01/21 170 lb (77.1 kg)  ?  ?Physical Exam ?Vitals and nursing note reviewed.  ?Constitutional:   ?   General: She is awake. She is not in acute distress. ?   Appearance: She is well-developed and overweight. She is not ill-appearing.  ?HENT:  ?   Head: Normocephalic.  ?  Right Ear: Hearing normal.  ?   Left Ear: Hearing normal.  ?Eyes:  ?   General: Lids are normal.     ?   Right eye: No discharge.     ?   Left eye: No discharge.  ?   Conjunctiva/sclera: Conjunctivae normal.  ?   Pupils: Pupils are equal, round, and reactive to light.  ?Neck:  ?   Thyroid: No thyromegaly.  ?   Vascular: No carotid bruit or JVD.  ?Cardiovascular:  ?   Rate and Rhythm: Normal rate and regular rhythm.  ?   Heart sounds: Normal heart sounds. No murmur heard. ?  No gallop.  ?Pulmonary:  ?   Effort: Pulmonary effort is normal. No accessory muscle usage or respiratory distress.  ?   Breath sounds: Normal breath sounds.  ?Abdominal:   ?   General: Bowel sounds are normal. There is no distension.  ?   Palpations: Abdomen is soft.  ?   Tenderness: There is abdominal tenderness in the right upper quadrant and epigastric area. There is no right CVA tenderness or left CVA tenderness. Negative signs include Murphy's sign and McBurney's sign.  ?   Hernia: No hernia is present.  ?Musculoskeletal:  ?   Cervical back: Normal range of motion and neck supple.  ?   Right lower leg: No edema.  ?   Left lower leg: No edema.  ?Lymphadenopathy:  ?   Cervical: No cervical adenopathy.  ?Skin: ?   General: Skin is warm and dry.  ?Neurological:  ?   Mental Status: She is alert and oriented to person, place, and time.  ?   Motor: Motor function is intact.  ?   Coordination: Coordination is intact.  ?   Gait: Gait is intact.  ?   Deep Tendon Reflexes: Reflexes are normal and symmetric.  ?   Reflex Scores: ?     Brachioradialis reflexes are 2+ on the right side and 2+ on the left side. ?     Patellar reflexes are 2+ on the right side and 2+ on the left side. ?Psychiatric:     ?   Attention and Perception: Attention normal.     ?   Mood and Affect: Mood normal.     ?   Behavior: Behavior normal. Behavior is cooperative.     ?   Thought Content: Thought content normal.     ?   Judgment: Judgment normal.  ? ?Results for orders placed or performed in visit on 05/09/21  ?Comprehensive metabolic panel  ?Result Value Ref Range  ? Glucose 119 (H) 70 - 99 mg/dL  ? BUN 14 8 - 27 mg/dL  ? Creatinine, Ser 0.61 0.57 - 1.00 mg/dL  ? eGFR 96 >59 mL/min/1.73  ? BUN/Creatinine Ratio 23 12 - 28  ? Sodium 139 134 - 144 mmol/L  ? Potassium 3.9 3.5 - 5.2 mmol/L  ? Chloride 94 (L) 96 - 106 mmol/L  ? CO2 26 20 - 29 mmol/L  ? Calcium 10.3 8.7 - 10.3 mg/dL  ? Total Protein 7.5 6.0 - 8.5 g/dL  ? Albumin 4.7 3.8 - 4.8 g/dL  ? Globulin, Total 2.8 1.5 - 4.5 g/dL  ? Albumin/Globulin Ratio 1.7 1.2 - 2.2  ? Bilirubin Total 0.4 0.0 - 1.2 mg/dL  ? Alkaline Phosphatase 53 44 - 121 IU/L  ? AST 25 0 - 40  IU/L  ? ALT 24 0 - 32 IU/L  ?Lipase  ?Result Value Ref Range  ?  Lipase 90 (H) 14 - 72 U/L  ?Amylase  ?Result Value Ref Range  ? Amylase 54 31 - 110 U/L  ? ?   ?Assessment & Plan:  ? ?Problem List Items Addressed This Visit   ? ?  ? Endocrine  ? Poorly controlled type 2 diabetes mellitus ([place]) - Primary  ?  Chronic, ongoing.  A1c was 6.8% on recent visit in March 2023. Patient was praised for this accomplishment. Urine ALB reduce to 27 April 2021. Continue Lotensin for kidney protection + Metformin 1000 MG BID.  Continue reduced Rybelsus 7 MG daily which she is tolerating better -- will recheck pancreas labs today and discussed with her may need to discontinued this.  Could consider Januvia.  Check CMP, lipase, and amylase today. Continue diet changes at home. Recommend to monitor BS daily at home and document daily with fasting BS goal of <130. ?  ?  ? Relevant Orders  ? Comprehensive metabolic panel  ?  ? Other  ? Elevated lipase  ?  Recheck labs today and focus on diet changes.  Consider imaging if ongoing elevations. ?  ?  ? Relevant Orders  ? Comprehensive metabolic panel  ? Lipase  ? Amylase  ? RUQ abdominal pain  ?  Concern for pancreatitis with Rybelsus at 14 MG dosing, improved with reduction in dosing.  Refer to diabetes plan of care. ?  ?  ?  ? ?Follow up plan: ?Return in about 3 weeks (around 06/12/2021) for Pancreatitis. ? ? ? ? ? ?

## 2021-05-23 LAB — COMPREHENSIVE METABOLIC PANEL
ALT: 16 IU/L (ref 0–32)
AST: 17 IU/L (ref 0–40)
Albumin/Globulin Ratio: 1.7 (ref 1.2–2.2)
Albumin: 4.5 g/dL (ref 3.8–4.8)
Alkaline Phosphatase: 52 IU/L (ref 44–121)
BUN/Creatinine Ratio: 22 (ref 12–28)
BUN: 14 mg/dL (ref 8–27)
Bilirubin Total: 0.3 mg/dL (ref 0.0–1.2)
CO2: 26 mmol/L (ref 20–29)
Calcium: 9.8 mg/dL (ref 8.7–10.3)
Chloride: 96 mmol/L (ref 96–106)
Creatinine, Ser: 0.63 mg/dL (ref 0.57–1.00)
Globulin, Total: 2.7 g/dL (ref 1.5–4.5)
Glucose: 130 mg/dL — ABNORMAL HIGH (ref 70–99)
Potassium: 4.1 mmol/L (ref 3.5–5.2)
Sodium: 138 mmol/L (ref 134–144)
Total Protein: 7.2 g/dL (ref 6.0–8.5)
eGFR: 95 mL/min/{1.73_m2} (ref >59)

## 2021-05-23 LAB — LIPASE: Lipase: 50 U/L (ref 14–72)

## 2021-05-23 LAB — AMYLASE: Amylase: 40 U/L (ref 31–110)

## 2021-05-23 NOTE — Progress Notes (Signed)
Please let Aimee know her labs have returned and overall normal kidney and liver function are present + pancreas labs are improved and within normal range.  At this time continue current medications, but if any issues please let me know right away.  Have a wonderful day!!

## 2021-06-16 NOTE — Patient Instructions (Signed)
Pancreatitis Eating Plan Pancreatitis is when your pancreas gets irritated and swollen (inflamed). The pancreas is a small gland behind your stomach. It helps your body digest food and regulate your blood sugar. Pancreatitis can affect how your body digests food, especially foods with fat. You may also have other symptoms such as pain in your abdomen (abdominal pain) or nausea. When you have pancreatitis, following a low-fat eating plan may help you manage symptoms and recover faster. Work with your health care provider or a dietitian to create an eating plan that is right for you. What are tips for following this plan? Reading food labels Use the information on food labels to help keep track of how much fat you eat: Check the serving size. Look for the amount of total fat in grams (g) in one serving. Low-fat foods have 3 g of fat or less per serving. Fat-free foods have 0.5 g of fat or less per serving. Keep track of how much fat you eat based on how many servings you eat. For example, if you eat two servings, the amount of fat you eat will be twice what is listed on the label. Shopping  Buy low-fat or nonfat foods, such as: Fresh, frozen, or canned fruits and vegetables. Grains, including pasta, bread, and rice. Lean meat, poultry, fish, and other protein foods. Low-fat or nonfat dairy. Avoid buying bakery products and other sweets made with whole milk, butter, and eggs. Avoid buying snack foods with added fat, such as anything with butter or cheese flavoring. Cooking Remove skin from poultry, and remove extra fat from meat. Limit the amount of fat and oil you use to 6 tsp (30 mL) or less per day. Cook using low-fat methods, such as boiling, broiling, grilling, steaming, or baking. Use spray oil to cook. Add fat-free chicken broth to add flavor and moisture. Avoid adding cream to thicken soups or sauces. Use other thickeners such as corn starch or tomato paste. Meal planning  Eat a  low-fat diet as told by your dietitian. For most people, this means having no more than 55-65 g of fat each day. Eat small, frequent meals throughout the day. For example, you may have 5 or 6 small meals instead of 3 large meals. Drink enough fluid to keep your urine pale yellow. Do not drink alcohol. Talk to your health care provider if you need help stopping. Limit how much caffeine you have, including black coffee, black and green tea, soft drinks with caffeine, and energy drinks. Plan to include a variety of foods in your diet. Include fruits, vegetables, whole grains and lean proteins, and low-fat or nonfat dairy. You need a balanced diet for good overall health. General information Let your health care provider or dietitian know if you have unplanned weight loss on this eating plan. You may be told to follow a clear liquid or soft food diet when symptoms come back, which is called a flare. Talk with your health care provider about how to manage your diet during symptoms of a flare. Take any vitamins or supplements as told by your health care provider. You may need to take: Extra vitamins that dissolve in fat (are fat soluble), such as vitamins A, D, E, and K. Nutritional supplements. Work with a dietitian, especially if you have other conditions such as obesity, osteoporosis, or diabetes mellitus. Some people need extra treatments, such as: Pills or capsules to replace enzymes (oral pancreatic enzyme replacement therapy). Feedings through a tube in the stomach or intestine (  enteral feedings). What foods should I avoid? Fruits Fried fruits. Fruits served with butter or cream. Vegetables Fried vegetables. Vegetables cooked with butter, cheese, or cream. Grains Biscuits, waffles, donuts, pastries, and croissants. Pies and cookies. Butter-flavored popcorn. Regular crackers. Meats and other proteins Fatty cuts of meat. Poultry with skin. Organ meats. Precooked or cured meat, such as sausages  or meat loaves. Whole eggs. Nuts and nut butters. Dairy Whole and 2% milk. Whole milk yogurt. Whole milk ice cream. Cream and half-and-half. Cheese, such as cream cheese. Sour cream. Beverages Wine, beer, and liquor. The items listed above may not be a complete list of foods and beverages you should avoid. Contact a dietitian for more information. Summary Pancreatitis can affect how your body digests food, especially foods with fat. When you have pancreatitis, it is recommended that you follow a low-fat eating plan to help you recover faster and manage symptoms. Do not drink alcohol. Limit the amount of caffeine you have, and drink enough fluid to keep your urine pale yellow. This information is not intended to replace advice given to you by your health care provider. Make sure you discuss any questions you have with your health care provider. Document Revised: 01/24/2021 Document Reviewed: 01/24/2021 Elsevier Patient Education  2023 [employer]  

## 2021-06-19 ENCOUNTER — Encounter: Payer: Self-pay | Admitting: Nurse Practitioner

## 2021-06-19 ENCOUNTER — Ambulatory Visit (INDEPENDENT_AMBULATORY_CARE_PROVIDER_SITE_OTHER): Payer: Medicare Other | Admitting: Nurse Practitioner

## 2021-06-19 VITALS — BP 126/79 | HR 94 | Temp 98.2°F | Ht 65.28 in | Wt 173.2 lb

## 2021-06-19 DIAGNOSIS — R1011 Right upper quadrant pain: Secondary | ICD-10-CM | POA: Diagnosis not present

## 2021-06-19 DIAGNOSIS — R748 Abnormal levels of other serum enzymes: Secondary | ICD-10-CM | POA: Diagnosis not present

## 2021-06-19 DIAGNOSIS — E1165 Type 2 diabetes mellitus with hyperglycemia: Secondary | ICD-10-CM

## 2021-06-19 MED ORDER — TIZANIDINE HCL 4 MG PO TABS: 4.0000 mg | ORAL_TABLET | Freq: Four times a day (QID) | ORAL | 0 refills | Status: DC | PRN

## 2021-06-19 NOTE — Progress Notes (Signed)
? ?BP 126/79   Pulse 94   Temp 98.2 ?F (36.8 ?C) (Oral)   Ht 5' 5.28" (1.658 m)   Wt 173 lb 3.2 oz (78.6 kg)   LMP  (LMP Unknown) Comment: age 71  SpO2 97%   BMI 28.58 kg/m?   ? ?Subjective:  ? ? Patient ID: Cheryl Mendez, female    DOB: 06/22/1950, 71 y.o.   MRN: [id number] ? ?HPI: ?Cheryl Mendez is a 71 y.o. female ? ?Chief Complaint  ?Patient presents with  ? Pancreatitis  ?  Patient is here to follow up 3 weeks for Pancreatitis. Patient says she still has pain on her sides when she is lying down at night and she will wake up hurting. Patient states she is cutting the medication in 1/2 and it helps and she isn't hurting as bad.   ? ?DIABETES ?Recent A1c on 05/01/21 with level 6.8%, much improved with consistent use of Rybelsus 14 MG daily and she also continues on Metformin 1000 MG BID.  However pain started with Rybelsus 14 MG increase and pancreas labs noted increase in Lipase to 90, Amylase stable at 54.   ? ?Recent visit cut back Rybelsus 7 MG due to abdominal pain, she reports the pain is improving with reduction in Rybelsus, able to eat now. Recent labs noted trend down to normal levels with reduction. ? ?Reports having some mild pain still when lying down at night but this is to both side which will wake her up sometimes, but overall is improving with 7 MG Rybelsus.  Reports she does feel a little nervous when it hurts, but does not want to change any of her regimen at this time.  She has started back to walking a few days a week with her neighbor.  ? ?Increased Omeprazole to 40 MG, recent visit which she has noted improvement with. ? ?In past has taken Trulicity with GI issues and Invokana stopped due to foot concerns + Jardiance made her sick. ?Hypoglycemic episodes:no ?Polydipsia/polyuria: no ?Visual disturbance: no ?Chest pain: no ?Paresthesias: no ?Glucose Monitoring: no ? Accucheck frequency: Not Checking, ran out of batteries, last check was 146 ? Fasting glucose: ? Post  prandial: ? Evening: ? Before meals: ?Taking Insulin?: no ? Long acting insulin: ? Short acting insulin: ?Blood Pressure Monitoring: not checking ?Retinal Examination: Up to Date ?Foot Exam: Up to Date ?Pneumovax: Up to Date ?Influenza: Up to Date ?Aspirin: no  ? ?Relevant past medical, surgical, family and social history reviewed and updated as indicated. Interim medical history since our last visit reviewed. ?Allergies and medications reviewed and updated. ? ?Review of Systems  ?Constitutional:  Negative for activity change, appetite change, diaphoresis, fatigue and fever.  ?HENT: Negative.    ?Respiratory:  Negative for cough, chest tightness, shortness of breath and wheezing.   ?Cardiovascular:  Negative for chest pain, palpitations and leg swelling.  ?Gastrointestinal:  Positive for abdominal pain and nausea. Negative for abdominal distention, constipation, diarrhea and vomiting.  ?Endocrine: Negative for cold intolerance, heat intolerance, polydipsia, polyphagia and polyuria.  ?Neurological: Negative.   ?Psychiatric/Behavioral: Negative.    ? ?Per HPI unless specifically indicated above ? ?   ?Objective:  ?  ?BP 126/79   Pulse 94   Temp 98.2 ?F (36.8 ?C) (Oral)   Ht 5' 5.28" (1.658 m)   Wt 173 lb 3.2 oz (78.6 kg)   LMP  (LMP Unknown) Comment: age 63  SpO2 97%   BMI 28.58 kg/m?   ?Wt Readings from  Last 3 Encounters:  ?06/19/21 173 lb 3.2 oz (78.6 kg)  ?05/22/21 170 lb 3.2 oz (77.2 kg)  ?05/09/21 169 lb 6.4 oz (76.8 kg)  ?  ?Physical Exam ?Vitals and nursing note reviewed.  ?Constitutional:   ?   General: She is awake. She is not in acute distress. ?   Appearance: She is well-developed and overweight. She is not ill-appearing.  ?HENT:  ?   Head: Normocephalic.  ?   Right Ear: Hearing normal.  ?   Left Ear: Hearing normal.  ?Eyes:  ?   General: Lids are normal.     ?   Right eye: No discharge.     ?   Left eye: No discharge.  ?   Conjunctiva/sclera: Conjunctivae normal.  ?   Pupils: Pupils are equal,  round, and reactive to light.  ?Neck:  ?   Thyroid: No thyromegaly.  ?   Vascular: No carotid bruit or JVD.  ?Cardiovascular:  ?   Rate and Rhythm: Normal rate and regular rhythm.  ?   Heart sounds: Normal heart sounds. No murmur heard. ?  No gallop.  ?Pulmonary:  ?   Effort: Pulmonary effort is normal. No accessory muscle usage or respiratory distress.  ?   Breath sounds: Normal breath sounds.  ?Abdominal:  ?   General: Bowel sounds are normal. There is no distension.  ?   Palpations: Abdomen is soft.  ?   Tenderness: There is abdominal tenderness. There is no right CVA tenderness or left CVA tenderness. Negative signs include Murphy's sign and McBurney's sign.  ?   Hernia: No hernia is present.  ?   Comments: Tenderness at this time is to both sides, more muscular in appearance.  No further RUQ or epigastric pain.  ?Musculoskeletal:  ?   Cervical back: Normal range of motion and neck supple.  ?   Right lower leg: No edema.  ?   Left lower leg: No edema.  ?Lymphadenopathy:  ?   Cervical: No cervical adenopathy.  ?Skin: ?   General: Skin is warm and dry.  ?Neurological:  ?   Mental Status: She is alert and oriented to person, place, and time.  ?   Motor: Motor function is intact.  ?   Coordination: Coordination is intact.  ?   Gait: Gait is intact.  ?   Deep Tendon Reflexes: Reflexes are normal and symmetric.  ?   Reflex Scores: ?     Brachioradialis reflexes are 2+ on the right side and 2+ on the left side. ?     Patellar reflexes are 2+ on the right side and 2+ on the left side. ?Psychiatric:     ?   Attention and Perception: Attention normal.     ?   Mood and Affect: Mood normal.     ?   Behavior: Behavior normal. Behavior is cooperative.     ?   Thought Content: Thought content normal.     ?   Judgment: Judgment normal.  ? ?Results for orders placed or performed in visit on 05/31/21  ?HM DIABETES EYE EXAM  ?Result Value Ref Range  ? HM Diabetic Eye Exam No Retinopathy No Retinopathy  ? ?   ?Assessment & Plan:   ? ?Problem List Items Addressed This Visit   ? ?  ? Endocrine  ? Poorly controlled type 2 diabetes mellitus ([place])  ?  Chronic, ongoing.  A1c was 6.8% on recent visit in March 2023. Patient was praised for  this accomplishment. Urine ALB reduce to 27 April 2021. Continue Lotensin for kidney protection + Metformin 1000 MG BID.  Continue reduced Rybelsus 7 MG daily which she is tolerating better -- will recheck pancreas labs today and discussed with her may need to discontinued this.  Could consider Januvia.  Check CMP, lipase, and amylase today. Continue diet changes at home. Recommend to monitor BS daily at home and document daily with fasting BS goal of <130. ? ?  ?  ?  ? Other  ? Elevated lipase  ?  Recheck labs today and focus on diet changes.  Consider imaging if ongoing elevations, she refuses today. ? ?  ?  ? RUQ abdominal pain - Primary  ?  Improved at this time, pain appears now more muscular to lateral aspects of abdomen both sides.  Will continue current diabetes plan and recheck labs today.  Tizanidine sent to take as needed for discomfort. ? ?  ?  ? Relevant Orders  ? Comprehensive metabolic panel  ? Lipase  ? Amylase  ?  ? ?Follow up plan: ?Return in about 6 weeks (around 08/02/2021) for T2DM, HTN/HLD, B12, GERD. ? ? ? ? ? ?

## 2021-06-19 NOTE — Assessment & Plan Note (Signed)
Chronic, ongoing.  A1c was 6.8% on recent visit in March 2023. Patient was praised for this accomplishment. Urine ALB reduce to 27 April 2021. Continue Lotensin for kidney protection + Metformin 1000 MG BID.  Continue reduced Rybelsus 7 MG daily which she is tolerating better -- will recheck pancreas labs today and discussed with her may need to discontinued this.  Could consider Januvia.  Check CMP, lipase, and amylase today. Continue diet changes at home. Recommend to monitor BS daily at home and document daily with fasting BS goal of <130. ?

## 2021-06-19 NOTE — Assessment & Plan Note (Signed)
Recheck labs today and focus on diet changes.  Consider imaging if ongoing elevations, she refuses today. ?

## 2021-06-19 NOTE — Assessment & Plan Note (Signed)
Improved at this time, pain appears now more muscular to lateral aspects of abdomen both sides.  Will continue current diabetes plan and recheck labs today.  Tizanidine sent to take as needed for discomfort. ?

## 2021-06-20 LAB — COMPREHENSIVE METABOLIC PANEL
ALT: 26 IU/L (ref 0–32)
AST: 24 IU/L (ref 0–40)
Albumin/Globulin Ratio: 1.6 (ref 1.2–2.2)
Albumin: 4.6 g/dL (ref 3.7–4.7)
Alkaline Phosphatase: 49 IU/L (ref 44–121)
BUN/Creatinine Ratio: 18 (ref 12–28)
BUN: 12 mg/dL (ref 8–27)
Bilirubin Total: 0.4 mg/dL (ref 0.0–1.2)
CO2: 27 mmol/L (ref 20–29)
Calcium: 9.9 mg/dL (ref 8.7–10.3)
Chloride: 95 mmol/L — ABNORMAL LOW (ref 96–106)
Creatinine, Ser: 0.65 mg/dL (ref 0.57–1.00)
Globulin, Total: 2.9 g/dL (ref 1.5–4.5)
Glucose: 105 mg/dL — ABNORMAL HIGH (ref 70–99)
Potassium: 3.9 mmol/L (ref 3.5–5.2)
Sodium: 138 mmol/L (ref 134–144)
Total Protein: 7.5 g/dL (ref 6.0–8.5)
eGFR: 94 mL/min/{1.73_m2} (ref >59)

## 2021-06-20 LAB — AMYLASE: Amylase: 44 U/L (ref 31–110)

## 2021-06-20 LAB — LIPASE: Lipase: 48 U/L (ref 14–85)

## 2021-06-20 NOTE — Progress Notes (Signed)
Good afternoon, please let Adamariz know her labs continue to be stable with normal pancreas labs -- GREAT NEWS!!  Liver function and kidney function are stable.  Continue all current medications and we will adjust as needed.  Any questions? ?Keep being amazing!!  Thank you for allowing me to participate in your care.  I appreciate you. ?Kindest regards, ?Kelson Queenan ?

## 2021-07-18 ENCOUNTER — Ambulatory Visit: Payer: Self-pay | Admitting: *Deleted

## 2021-07-18 NOTE — Telephone Encounter (Signed)
Summary: Dizziness and vomiting   Patient stated since yesterday she has been feeling dizzy, weak and has vomited. She took an at home covid test and said it was negative. She wanted to see if she could be seen today by [facility]      Chief Complaint: dizziness, vomiting- unable to check glucose Symptoms: dizziness, vomiting Frequency: ongoing medication SE- now has had vomiting Pertinent Negatives: Patient denies COVID- she tested negative fever, chest pain,diarrhea, bleeding, Disposition: '[]'$ ED /'[]'$ Urgent Care (no appt availability in office) / '[]'$ Appointment(In office/virtual)/ '[]'$  [facility]/ '[]'$ Home Care/ '[x]'$ Refused Recommended Disposition /'[]'$ [facility]/ '[]'$  Follow-up with PCP Additional Notes: Patient wants to come to office to be worked in this afternoon- she may not have transportation tomorrow- patient offered appointment tomorrow- she wants to know if provier wants to stop her Rybelsus.   Reason for Disposition  [1] MODERATE dizziness (e.g., interferes with normal activities) AND [2] has been evaluated by physician for this  Answer Assessment - Initial Assessment Questions 1. DESCRIPTION: "Describe your dizziness."     Lightheaded- possible medication reaction/SE 2. LIGHTHEADED: "Do you feel lightheaded?" (e.g., somewhat faint, woozy, weak upon standing)     Patient vomited this morning- has has light diet today- crackers 3. VERTIGO: "Do you feel like either you or the room is spinning or tilting?" (i.e. vertigo)     no 4. SEVERITY: "How bad is it?"  "Do you feel like you are going to faint?" "Can you stand and walk?"   - MILD: Feels slightly dizzy, but walking normally.   - MODERATE: Feels unsteady when walking, but not falling; interferes with normal activities (e.g., school, work).   - SEVERE: Unable to walk without falling, or requires assistance to walk without falling; feels like passing out now.      mild 5. ONSET:  "When did the dizziness begin?"      Yesterday- but has been having SE to Rybelsus  6. AGGRAVATING FACTORS: "Does anything make it worse?" (e.g., standing, change in head position)       7. HEART RATE: "Can you tell me your heart rate?" "How many beats in 15 seconds?"  (Note: not all patients can do this)         8. CAUSE: "What do you think is causing the dizziness?"     Patient is not sure if she has virus- or medication SE 9. RECURRENT SYMPTOM: "Have you had dizziness before?" If Yes, ask: "When was the last time?" "What happened that time?"     *No Answer* 10. OTHER SYMPTOMS: "Do you have any other symptoms?" (e.g., fever, chest pain, vomiting, diarrhea, bleeding)       Vomiting  11. PREGNANCY: "Is there any chance you are pregnant?" "When was your last menstrual period?"       *No Answer*  Protocols used: Dizziness - Lightheadedness-A-AH

## 2021-07-19 NOTE — Telephone Encounter (Signed)
LVM asking patient to call back to schedule an appointment 

## 2021-08-06 ENCOUNTER — Ambulatory Visit: Payer: Medicare Other | Admitting: Nurse Practitioner

## 2021-08-07 ENCOUNTER — Other Ambulatory Visit: Payer: Self-pay | Admitting: Nurse Practitioner

## 2021-08-07 NOTE — Telephone Encounter (Signed)
Requested Prescriptions  Pending Prescriptions Disp Refills  . benazepril-hydrochlorthiazide (LOTENSIN HCT) 20-25 MG tablet [Pharmacy Med Name: BENAZEPRIL/HCTZ 20/25MG TABLETS] 90 tablet 4    Sig: TAKE 1 TABLET BY MOUTH DAILY     Cardiovascular:  ACEI + Diuretic Combos Passed - 08/07/2021  7:19 AM      Passed - Na in normal range and within 180 days    Sodium  Date Value Ref Range Status  06/19/2021 138 134 - 144 mmol/L Final  06/09/2014 136 mmol/L Final    Comment:    135-145 NOTE: New Reference Range  04/26/14          Passed - K in normal range and within 180 days    Potassium  Date Value Ref Range Status  06/19/2021 3.9 3.5 - 5.2 mmol/L Final  06/09/2014 3.3 (L) mmol/L Final    Comment:    3.5-5.1 NOTE: New Reference Range  04/26/14          Passed - Cr in normal range and within 180 days    Creatinine  Date Value Ref Range Status  06/09/2014 0.73 mg/dL Final    Comment:    0.44-1.00 NOTE: New Reference Range  04/26/14    Creatinine, Ser  Date Value Ref Range Status  06/19/2021 0.65 0.57 - 1.00 mg/dL Final         Passed - eGFR is 30 or above and within 180 days    EGFR (African American)  Date Value Ref Range Status  06/09/2014 >60  Final   GFR calc Af Amer  Date Value Ref Range Status  03/28/2020 103 >59 mL/min/1.73 Final    Comment:    **In accordance with recommendations from the NKF-ASN Task force,**   [facility] is in the process of updating its eGFR calculation to the   2021 CKD-EPI creatinine equation that estimates kidney function   without a race variable.    EGFR (Non-African Amer.)  Date Value Ref Range Status  06/09/2014 >60  Final    Comment:    eGFR values <84m/min/1.73 m2 may be an indication of chronic kidney disease (CKD). Calculated eGFR is useful in patients with stable renal function. The eGFR calculation will not be reliable in acutely ill patients when serum creatinine is changing rapidly. It is not useful in patients on  dialysis. The eGFR calculation may not be applicable to patients at the low and high extremes of body sizes, pregnant women, and vegetarians.    GFR calc non Af Amer  Date Value Ref Range Status  03/28/2020 89 >59 mL/min/1.73 Final   eGFR  Date Value Ref Range Status  06/19/2021 94 >59 mL/min/1.73 Final         Passed - Patient is not pregnant      Passed - Last BP in normal range    BP Readings from Last 1 Encounters:  06/19/21 126/79         Passed - Valid encounter within last 6 months    Recent Outpatient Visits          1 month ago RUQ abdominal [place]   [facility] [place]   2 months ago Poorly controlled type 2 diabetes mellitus ([place]   [place] [facility] [place]   3 months ago Poorly controlled type 2 diabetes mellitus ([place] [place]   3 months ago Poorly controlled type 2 diabetes mellitus ([facility]   5 months ago Primary osteoarthritis of left knee   [facility] [place], Barbaraann Faster, NP      Future Appointments            In 2 months Cann[place], Barbaraann Faster, NP [employer]

## 2021-10-28 NOTE — Patient Instructions (Addendum)
Please call to schedule your mammogram and/or bone density: [facility] [place]: [address], [place], [place] [address] Phone: [phone]  [place] [facility] [address] [place],  [place]  [address] Phone: [phone]    Diabetes Mellitus and Nutrition, Adult When you have diabetes, or diabetes mellitus, it is very important to have healthy eating habits because your blood sugar (glucose) levels are greatly affected by what you eat and drink. Eating healthy foods in the right amounts, at about the same times every day, can help you: Manage your blood glucose. Lower your risk of heart disease. Improve your blood pressure. Reach or maintain a healthy weight. What can affect my meal plan? Every person with diabetes is different, and each person has different needs for a meal plan. Your health care provider may recommend that you work with a dietitian to make a meal plan that is best for you. Your meal plan may vary depending on factors such as: The calories you need. The medicines you take. Your weight. Your blood glucose, blood pressure, and cholesterol levels. Your activity level. Other health conditions you have, such as heart or kidney disease. How do carbohydrates affect me? Carbohydrates, also called carbs, affect your blood glucose level more than any other type of food. Eating carbs raises the amount of glucose in your blood. It is important to know how many carbs you can safely have in each meal. This is different for every person. Your dietitian can help you calculate how many carbs you should have at each meal and for each snack. How does alcohol affect me? Alcohol can cause a decrease in blood glucose (hypoglycemia), especially if you use insulin or take certain diabetes medicines by mouth. Hypoglycemia can be a life-threatening condition. Symptoms of hypoglycemia, such as sleepiness, dizziness, and  confusion, are similar to symptoms of having too much alcohol. Do not drink alcohol if: Your health care provider tells you not to drink. You are pregnant, may be pregnant, or are planning to become pregnant. If you drink alcohol: Limit how much you have to: 0-1 drink a day for women. 0-2 drinks a day for men. Know how much alcohol is in your drink. In the [place] one drink equals one 12 oz bottle of beer (355 mL), one 5 oz glass of wine (148 mL), or one 1 oz glass of hard liquor (44 mL). Keep yourself hydrated with water, diet soda, or unsweetened iced tea. Keep in mind that regular soda, juice, and other mixers may contain a lot of sugar and must be counted as carbs. What are tips for following this plan?  Reading food labels Start by checking the serving size on the Nutrition Facts label of packaged foods and drinks. The number of calories and the amount of carbs, fats, and other nutrients listed on the label are based on one serving of the item. Many items contain more than one serving per package. Check the total grams (g) of carbs in one serving. Check the number of grams of saturated fats and trans fats in one serving. Choose foods that have a low amount or none of these fats. Check the number of milligrams (mg) of salt (sodium) in one serving. Most people should limit total sodium intake to less than 2,300 mg per day. Always check the nutrition information of foods labeled as "low-fat" or "nonfat." These foods may be higher in added sugar or refined carbs and  should be avoided. Talk to your dietitian to identify your daily goals for nutrients listed on the label. Shopping Avoid buying canned, pre-made, or processed foods. These foods tend to be high in fat, sodium, and added sugar. Shop around the outside edge of the grocery store. This is where you will most often find fresh fruits and vegetables, bulk grains, fresh meats, and fresh dairy products. Cooking Use low-heat cooking methods,  such as baking, instead of high-heat cooking methods, such as deep frying. Cook using healthy oils, such as olive, canola, or sunflower oil. Avoid cooking with butter, cream, or high-fat meats. Meal planning Eat meals and snacks regularly, preferably at the same times every day. Avoid going long periods of time without eating. Eat foods that are high in fiber, such as fresh fruits, vegetables, beans, and whole grains. Eat 4-6 oz (112-168 g) of lean protein each day, such as lean meat, chicken, fish, eggs, or tofu. One ounce (oz) (28 g) of lean protein is equal to: 1 oz (28 g) of meat, chicken, or fish. 1 egg.  cup (62 g) of tofu. Eat some foods each day that contain healthy fats, such as avocado, nuts, seeds, and fish. What foods should I eat? Fruits Berries. Apples. Oranges. Peaches. Apricots. Plums. Grapes. Mangoes. Papayas. Pomegranates. Kiwi. Cherries. Vegetables Leafy greens, including lettuce, spinach, kale, chard, collard greens, mustard greens, and cabbage. Beets. Cauliflower. Broccoli. Carrots. Green beans. Tomatoes. Peppers. Onions. Cucumbers. Brussels sprouts. Grains Whole grains, such as whole-wheat or whole-grain bread, crackers, tortillas, cereal, and pasta. Unsweetened oatmeal. Quinoa. Brown or wild rice. Meats and other proteins Seafood. Poultry without skin. Lean cuts of poultry and beef. Tofu. Nuts. Seeds. Dairy Low-fat or fat-free dairy products such as milk, yogurt, and cheese. The items listed above may not be a complete list of foods and beverages you can eat and drink. Contact a dietitian for more information. What foods should I avoid? Fruits Fruits canned with syrup. Vegetables Canned vegetables. Frozen vegetables with butter or cream sauce. Grains Refined white flour and flour products such as bread, pasta, snack foods, and cereals. Avoid all processed foods. Meats and other proteins Fatty cuts of meat. Poultry with skin. Breaded or fried meats. Processed  meat. Avoid saturated fats. Dairy Full-fat yogurt, cheese, or milk. Beverages Sweetened drinks, such as soda or iced tea. The items listed above may not be a complete list of foods and beverages you should avoid. Contact a dietitian for more information. Questions to ask a health care provider Do I need to meet with a certified diabetes care and education specialist? Do I need to meet with a dietitian? What number can I call if I have questions? When are the best times to check my blood glucose? Where to find more information: American Diabetes Association: diabetes.org Academy of Nutrition and Dietetics: eatright.[employer] of Diabetes and Digestive and Kidney Diseases: Amen[employer].is Association of Diabetes Care & Education Specialists: diabeteseducator.org Summary It is important to have healthy eating habits because your blood sugar (glucose) levels are greatly affected by what you eat and drink. It is important to use alcohol carefully. A healthy meal plan will help you manage your blood glucose and lower your risk of heart disease. Your health care provider may recommend that you work with a dietitian to make a meal plan that is best for you. This information is not intended to replace advice given to you by your health care provider. Make sure you discuss any questions you have with your health  care provider. Document Revised: 09/08/2019 Document Reviewed: 09/08/2019 Elsevier Patient Education  [place].

## 2021-11-01 ENCOUNTER — Ambulatory Visit (INDEPENDENT_AMBULATORY_CARE_PROVIDER_SITE_OTHER): Payer: Medicare Other | Admitting: Nurse Practitioner

## 2021-11-01 ENCOUNTER — Ambulatory Visit (INDEPENDENT_AMBULATORY_CARE_PROVIDER_SITE_OTHER): Payer: Medicare Other | Admitting: *Deleted

## 2021-11-01 ENCOUNTER — Telehealth: Payer: Self-pay | Admitting: Nurse Practitioner

## 2021-11-01 ENCOUNTER — Encounter: Payer: Self-pay | Admitting: Nurse Practitioner

## 2021-11-01 VITALS — BP 128/82 | HR 87 | Temp 97.7°F | Ht 65.0 in | Wt 174.5 lb

## 2021-11-01 DIAGNOSIS — I152 Hypertension secondary to endocrine disorders: Secondary | ICD-10-CM | POA: Diagnosis not present

## 2021-11-01 DIAGNOSIS — Z1211 Encounter for screening for malignant neoplasm of colon: Secondary | ICD-10-CM

## 2021-11-01 DIAGNOSIS — E785 Hyperlipidemia, unspecified: Secondary | ICD-10-CM | POA: Diagnosis not present

## 2021-11-01 DIAGNOSIS — E1159 Type 2 diabetes mellitus with other circulatory complications: Secondary | ICD-10-CM

## 2021-11-01 DIAGNOSIS — E1165 Type 2 diabetes mellitus with hyperglycemia: Secondary | ICD-10-CM

## 2021-11-01 DIAGNOSIS — M1712 Unilateral primary osteoarthritis, left knee: Secondary | ICD-10-CM

## 2021-11-01 DIAGNOSIS — Z1231 Encounter for screening mammogram for malignant neoplasm of breast: Secondary | ICD-10-CM

## 2021-11-01 DIAGNOSIS — Z0000 Encounter for general adult medical examination without abnormal findings: Secondary | ICD-10-CM | POA: Diagnosis not present

## 2021-11-01 DIAGNOSIS — M19011 Primary osteoarthritis, right shoulder: Secondary | ICD-10-CM | POA: Diagnosis not present

## 2021-11-01 DIAGNOSIS — Z780 Asymptomatic menopausal state: Secondary | ICD-10-CM

## 2021-11-01 DIAGNOSIS — E1169 Type 2 diabetes mellitus with other specified complication: Secondary | ICD-10-CM

## 2021-11-01 DIAGNOSIS — Z23 Encounter for immunization: Secondary | ICD-10-CM | POA: Diagnosis not present

## 2021-11-01 DIAGNOSIS — E559 Vitamin D deficiency, unspecified: Secondary | ICD-10-CM | POA: Diagnosis not present

## 2021-11-01 LAB — BAYER DCA HB A1C WAIVED: HB A1C (BAYER DCA - WAIVED): 7.4 % — ABNORMAL HIGH (ref 4.8–5.6)

## 2021-11-01 MED ORDER — ONETOUCH VERIO W/DEVICE KIT
PACK | 0 refills | Status: DC
Start: 2021-11-01 — End: 2021-11-01

## 2021-11-01 MED ORDER — ONETOUCH ULTRA 2 W/DEVICE KIT: PACK | 1 refills | Status: DC

## 2021-11-01 MED ORDER — ATORVASTATIN CALCIUM 10 MG PO TABS: ORAL_TABLET | ORAL | 4 refills | Status: DC

## 2021-11-01 MED ORDER — BENAZEPRIL-HYDROCHLOROTHIAZIDE 20-25 MG PO TABS: 1.0000 | ORAL_TABLET | Freq: Every day | ORAL | 4 refills | Status: DC

## 2021-11-01 MED ORDER — TIZANIDINE HCL 4 MG PO TABS: 4.0000 mg | ORAL_TABLET | Freq: Four times a day (QID) | ORAL | 0 refills | Status: DC | PRN

## 2021-11-01 NOTE — Assessment & Plan Note (Signed)
Chronic, ongoing with BP below goal on recheck today.  Continue current medication regimen and adjust as needed -- Lotensin offering kidney protection.  Recommend checking BP at home three mornings a week and documenting for provider review.  CMP today.  Return in 3 months.

## 2021-11-01 NOTE — Assessment & Plan Note (Addendum)
Chronic, ongoing.  A1c today 7.4% slight trend up due to diet and missing Rybelsus for a period, previous was 6.8%. Urine ALB 27 April 2021. Continue Lotensin for kidney protection,  Metformin 1000 MG BID and Rybelsus 7 MG daily -- would like to avoid insulin, could consider SGLT2 in future and education on this. Continue diet changes at home. Recommend to monitor BS daily at home and document daily with fasting BS goal of <130.  Monitor diet closely with focus on diabetic diet and regular exercise.

## 2021-11-01 NOTE — Assessment & Plan Note (Signed)
Ongoing issue, significant arthritic changes present.  Continue at home regimen and referral to ortho placed.

## 2021-11-01 NOTE — Patient Instructions (Signed)
Ms. Cheryl Mendez , Thank you for taking time to come for your Medicare Wellness Visit. I appreciate your ongoing commitment to your health goals. Please review the following plan we discussed and let me know if I can assist you in the future.   Screening recommendations/referrals: Colonoscopy: up to date Mammogram: Education provided Bone Density: Education provided Recommended yearly ophthalmology/optometry visit for glaucoma screening and checkup Recommended yearly dental visit for hygiene and checkup  Vaccinations: Influenza vaccine: Education provided Pneumococcal vaccine: up to date Tdap vaccine: up to date Shingles vaccine: Education provided    Advanced directives: Education provided  Conditions/risks identified:      Preventive Care 29 Years and Older, Female Preventive care refers to lifestyle choices and visits with your health care provider that can promote health and wellness. What does preventive care include? A yearly physical exam. This is also called an annual well check. Dental exams once or twice a year. Routine eye exams. Ask your health care provider how often you should have your eyes checked. Personal lifestyle choices, including: Daily care of your teeth and gums. Regular physical activity. Eating a healthy diet. Avoiding tobacco and drug use. Limiting alcohol use. Practicing safe sex. Taking low-dose aspirin every day. Taking vitamin and mineral supplements as recommended by your health care provider. What happens during an annual well check? The services and screenings done by your health care provider during your annual well check will depend on your age, overall health, lifestyle risk factors, and family history of disease. Counseling  Your health care provider may ask you questions about your: Alcohol use. Tobacco use. Drug use. Emotional well-being. Home and relationship well-being. Sexual activity. Eating habits. History of falls. Memory and  ability to understand (cognition). Work and work Statistician. Reproductive health. Screening  You may have the following tests or measurements: Height, weight, and BMI. Blood pressure. Lipid and cholesterol levels. These may be checked every 5 years, or more frequently if you are over 48 years old. Skin check. Lung cancer screening. You may have this screening every year starting at age 65 if you have a 30-pack-year history of smoking and currently smoke or have quit within the past 15 years. Fecal occult blood test (FOBT) of the stool. You may have this test every year starting at age 60. Flexible sigmoidoscopy or colonoscopy. You may have a sigmoidoscopy every 5 years or a colonoscopy every 10 years starting at age 55. Hepatitis C blood test. Hepatitis B blood test. Sexually transmitted disease (STD) testing. Diabetes screening. This is done by checking your blood sugar (glucose) after you have not eaten for a while (fasting). You may have this done every 1-3 years. Bone density scan. This is done to screen for osteoporosis. You may have this done starting at age 57. Mammogram. This may be done every 1-2 years. Talk to your health care provider about how often you should have regular mammograms. Talk with your health care provider about your test results, treatment options, and if necessary, the need for more tests. Vaccines  Your health care provider may recommend certain vaccines, such as: Influenza vaccine. This is recommended every year. Tetanus, diphtheria, and acellular pertussis (Tdap, Td) vaccine. You may need a Td booster every 10 years. Zoster vaccine. You may need this after age 32. Pneumococcal 13-valent conjugate (PCV13) vaccine. One dose is recommended after age 52. Pneumococcal polysaccharide (PPSV23) vaccine. One dose is recommended after age 42. Talk to your health care provider about which screenings and vaccines you need and  how often you need them. This information is  not intended to replace advice given to you by your health care provider. Make sure you discuss any questions you have with your health care provider. Document Released: 03/03/2015 Document Revised: 10/25/2015 Document Reviewed: 12/06/2014 Elsevier Interactive Patient Education  2017 [place] Prevention in the Home Falls can cause injuries. They can happen to people of all ages. There are many things you can do to make your home safe and to help prevent falls. What can I do on the outside of my home? Regularly fix the edges of walkways and driveways and fix any cracks. Remove anything that might make you trip as you walk through a door, such as a raised step or threshold. Trim any bushes or trees on the path to your home. Use bright outdoor lighting. Clear any walking paths of anything that might make someone trip, such as rocks or tools. Regularly check to see if handrails are loose or broken. Make sure that both sides of any steps have handrails. Any raised decks and porches should have guardrails on the edges. Have any leaves, snow, or ice cleared regularly. Use sand or salt on walking paths during winter. Clean up any spills in your garage right away. This includes oil or grease spills. What can I do in the bathroom? Use night lights. Install grab bars by the toilet and in the tub and shower. Do not use towel bars as grab bars. Use non-skid mats or decals in the tub or shower. If you need to sit down in the shower, use a plastic, non-slip stool. Keep the floor dry. Clean up any water that spills on the floor as soon as it happens. Remove soap buildup in the tub or shower regularly. Attach bath mats securely with double-sided non-slip rug tape. Do not have throw rugs and other things on the floor that can make you trip. What can I do in the bedroom? Use night lights. Make sure that you have a light by your bed that is easy to reach. Do not use any sheets or blankets that  are too big for your bed. They should not hang down onto the floor. Have a firm chair that has side arms. You can use this for support while you get dressed. Do not have throw rugs and other things on the floor that can make you trip. What can I do in the kitchen? Clean up any spills right away. Avoid walking on wet floors. Keep items that you use a lot in easy-to-reach places. If you need to reach something above you, use a strong step stool that has a grab bar. Keep electrical cords out of the way. Do not use floor polish or wax that makes floors slippery. If you must use wax, use non-skid floor wax. Do not have throw rugs and other things on the floor that can make you trip. What can I do with my stairs? Do not leave any items on the stairs. Make sure that there are handrails on both sides of the stairs and use them. Fix handrails that are broken or loose. Make sure that handrails are as long as the stairways. Check any carpeting to make sure that it is firmly attached to the stairs. Fix any carpet that is loose or worn. Avoid having throw rugs at the top or bottom of the stairs. If you do have throw rugs, attach them to the floor with carpet tape. Make sure that you have  a light switch at the top of the stairs and the bottom of the stairs. If you do not have them, ask someone to add them for you. What else can I do to help prevent falls? Wear shoes that: Do not have high heels. Have rubber bottoms. Are comfortable and fit you well. Are closed at the toe. Do not wear sandals. If you use a stepladder: Make sure that it is fully opened. Do not climb a closed stepladder. Make sure that both sides of the stepladder are locked into place. Ask someone to hold it for you, if possible. Clearly mark and make sure that you can see: Any grab bars or handrails. First and last steps. Where the edge of each step is. Use tools that help you move around (mobility aids) if they are needed. These  include: Canes. Walkers. Scooters. Crutches. Turn on the lights when you go into a dark area. Replace any light bulbs as soon as they burn out. Set up your furniture so you have a clear path. Avoid moving your furniture around. If any of your floors are uneven, fix them. If there are any pets around you, be aware of where they are. Review your medicines with your doctor. Some medicines can make you feel dizzy. This can increase your chance of falling. Ask your doctor what other things that you can do to help prevent falls. This information is not intended to replace advice given to you by your health care provider. Make sure you discuss any questions you have with your health care provider. Document Released: 12/01/2008 Document Revised: 07/13/2015 Document Reviewed: 03/11/2014 Elsevier Interactive Patient Education  2017 [employer].

## 2021-11-01 NOTE — Assessment & Plan Note (Signed)
Chronic, ongoing.  Continue current medication regimen and adjust as needed. Lipid panel today. 

## 2021-11-01 NOTE — Telephone Encounter (Signed)
PT had an appointment and asked someone at the front desk regarding filling out this form.  She stated she needs it for her knee.  Put in provider's folder.  Call if/when it is completed.

## 2021-11-01 NOTE — Progress Notes (Signed)
Subjective:   Cheryl Mendez is a 71 y.o. female who presents for Medicare Annual (Subsequent) preventive examination.  I connected with  Cheryl Mendez on 11/01/21 by a telephone enabled telemedicine application and verified that I am speaking with the correct person using two identifiers.   I discussed the limitations of evaluation and management by telemedicine. The patient expressed understanding and agreed to proceed.  Patient location: home  Provider location:   Tele-home-health    Review of Systems     Cardiac Risk Factors include: advanced age (>58men, >68 women);diabetes mellitus;hypertension;obesity (BMI >30kg/m2);sedentary lifestyle     Objective:    Today's Vitals   11/01/21 1233  PainSc: 7    There is no height or weight on file to calculate BMI.     11/01/2021   12:37 PM 10/31/2020    5:58 PM 06/25/2016    9:26 AM 05/06/2016    8:10 AM  Advanced Directives  Does Patient Have a Medical Advance Directive? No Yes;No No No  Does patient want to make changes to medical advance directive?  No - Patient declined    Would patient like information on creating a medical advance directive? No - Patient declined No - Patient declined No - Patient declined Yes (MAU/Ambulatory/Procedural Areas - Information given)    Current Medications (verified) Outpatient Encounter Medications as of 11/01/2021  Medication Sig   aspirin 81 MG chewable tablet Chew 81 mg by mouth daily.   atorvastatin (LIPITOR) 10 MG tablet TAKE 1 TABLET(10 MG) BY MOUTH DAILY   benazepril-hydrochlorthiazide (LOTENSIN HCT) 20-25 MG tablet Take 1 tablet by mouth daily.   Blood Glucose Monitoring Suppl (ONE TOUCH ULTRA 2) w/Device KIT Use to check blood sugar 3 times a day and document results, bring to appointments. Goal is <130 fasting blood sugar and <180 two hours after meals   EPINEPHrine 0.3 mg/0.3 mL IJ SOAJ injection Inject 0.3 mg into the muscle as needed for anaphylaxis.   fluticasone (FLONASE) 50  MCG/ACT nasal spray Place 2 sprays into both nostrils daily.   loratadine (CLARITIN) 10 MG tablet TAKE 1 TABLET(10 MG) BY MOUTH DAILY   meloxicam (MOBIC) 7.5 MG tablet TAKE 1 TABLET(7.5 MG) BY MOUTH DAILY   metFORMIN (GLUCOPHAGE) 1000 MG tablet TAKE 1 TABLET(1000 MG) BY MOUTH TWICE DAILY WITH A MEAL   omeprazole (PRILOSEC) 20 MG capsule Take 40 mg by mouth daily.   ONETOUCH DELICA LANCETS FINE MISC USE UP TO FOUR TIMES DAILY AS DIRECTED   ONETOUCH ULTRA test strip TEST FOUR TIMES DAILY   Semaglutide (RYBELSUS) 7 MG TABS Take 7 mg by mouth daily.   tiZANidine (ZANAFLEX) 4 MG tablet Take 1 tablet (4 mg total) by mouth every 6 (six) hours as needed for muscle spasms.   [DISCONTINUED] atorvastatin (LIPITOR) 10 MG tablet TAKE 1 TABLET(10 MG) BY MOUTH DAILY   [DISCONTINUED] benazepril-hydrochlorthiazide (LOTENSIN HCT) 20-25 MG tablet TAKE 1 TABLET BY MOUTH DAILY   [DISCONTINUED] blood glucose meter kit and supplies KIT Dispense based on patient and insurance preference. Use up to four times daily as directed. (FOR ICD-9 250.00, 250.01).   [DISCONTINUED] tiZANidine (ZANAFLEX) 4 MG tablet Take 1 tablet (4 mg total) by mouth every 6 (six) hours as needed for muscle spasms.   No facility-administered encounter medications on file as of 11/01/2021.    Allergies (verified) Penicillin g benzathine   History: Past Medical History:  Diagnosis Date   Allergy    Arthritis    Diabetes mellitus without complication (HCC)  GERD (gastroesophageal reflux disease)    Hyperlipidemia    Hypertension    Lumbago    Migraine    Past Surgical History:  Procedure Laterality Date   COLONOSCOPY WITH PROPOFOL N/A 06/25/2016   Procedure: COLONOSCOPY WITH PROPOFOL;  Surgeon: Jonathon Bellows, MD;  [facility] END[facility];  Service: Endoscopy;  Laterality: N/A;   ESOPHAGOGASTRODUODENOSCOPY (EGD) WITH PROPOFOL N/A 06/25/2016   Procedure: ESOPHAGOGASTRODUODENOSCOPY (EGD) WITH PROPOFOL;  Surgeon: Jonathon Bellows, MD;  Location:  [facility] ENDOSCOPY;  Service: Endoscopy;  Laterality: N/A;   NO PAST SURGERIES     Family History  Problem Relation Age of Onset   Diabetes Mother    Heart disease Father    Hypertension Father    Eczema Sister    COPD Sister    Social History   Socioeconomic History   Marital status: Widowed    Spouse name: Not on file   Number of children: Not on file   Years of education: Not on file   Highest education level: Not on file  Occupational History   Not on file  Tobacco Use   Smoking status: Never   Smokeless tobacco: Never  Vaping Use   Vaping Use: Never used  Substance and Sexual Activity   Alcohol use: No    Alcohol/week: 0.0 standard drinks of alcohol   Drug use: No   Sexual activity: Never  Other Topics Concern   Not on file  Social History Narrative   Not on file   Social Determinants of Health   Financial Resource Strain: Low Risk  (11/01/2021)   Overall Financial Resource Strain (CARDIA)    Difficulty of Paying Living Expenses: Not hard at all  Food Insecurity: No Food Insecurity (11/01/2021)   Hunger Vital Sign    Worried About Running Out of Food in the Last Year: Never true    Ran Out of Food in the Last Year: Never true  Transportation Needs: No Transportation Needs (10/31/2020)   PRAPARE - Hydrologist (Medical): No    Lack of Transportation (Non-Medical): No  Physical Activity: Inactive (11/01/2021)   Exercise Vital Sign    Days of Exercise per Week: 0 days    Minutes of Exercise per Session: 0 min  Stress: No Stress Concern Present (11/01/2021)   [place]    Feeling of Stress : Not at all  Social Connections: Unknown (11/01/2021)   Social Connection and Isolation Panel [NHANES]    Frequency of Communication with Friends and Family: More than three times a week    Frequency of Social Gatherings with Friends and Family: Twice a week    Attends Religious  Services: 1 to 4 times per year    Active Member of [employer] or Organizations: No    Attends Music therapist: Never    Marital Status: Not on file    Tobacco Counseling Counseling given: Not Answered   Clinical Intake:  Pre-visit preparation completed: Yes  Pain : 0-10 Pain Score: 7  Pain Location: Shoulder Pain Orientation: Right Pain Descriptors / Indicators: Constant, Burning, Aching Pain Onset: More than a month ago Pain Frequency: Constant Pain Relieving Factors: aleve, Effect of Pain on Daily Activities: yes  Pain Relieving Factors: aleve,  Diabetes: Yes CBG done?: No Did pt. bring in CBG monitor from home?: No  How often do you need to have someone help you when you read instructions, pamphlets, or other written materials from your doctor  or pharmacy?: 1 - Never  Diabetic?  Yes  Nutrition Risk Assessment:  Has the patient had any N/V/D within the last 2 months?  No  Does the patient have any non-healing wounds?  No  Has the patient had any unintentional weight loss or weight gain?  No   Diabetes:  Is the patient diabetic?  Yes  If diabetic, was a CBG obtained today?  No  Did the patient bring in their glucometer from home?  No  How often do you monitor your CBG's? A machine was ordered.   Financial Strains and Diabetes Management:  Are you having any financial strains with the device, your supplies or your medication? No .  Does the patient want to be seen by Chronic Care Management for management of their diabetes?  No  Would the patient like to be referred to a Nutritionist or for Diabetic Management?  No   Diabetic Exams:  Diabetic Eye Exam:. Pt has been advised about the importance in completing this exam. A referral has been placed today.   Diabetic Foot Exam: Pt has been advised about the importance in completing this exam.   Interpreter Needed?: No  Information entered by :: Leroy Kennedy LPN   Activities of Daily Living     11/01/2021   12:55 PM  In your present state of health, do you have any difficulty performing the following activities:  Hearing? 0  Vision? 0  Difficulty concentrating or making decisions? 0  Walking or climbing stairs? 0  Dressing or bathing? 0  Doing errands, shopping? 0  Preparing Food and eating ? N  Using the Toilet? N  In the past six months, have you accidently leaked urine? N  Do you have problems with loss of bowel control? N  Managing your Medications? N  Managing your Finances? N  Housekeeping or managing your Housekeeping? N    Patient Care Team: Venita Lick, NP as PCP - General (Nurse Practitioner) Vladimir Faster, [facility] (Inactive) (Pharmacist)  Indicate any recent Medical Services you may have received from other than [facility] providers in the past year (date may be approximate).     Assessment:   This is a routine wellness examination for [place].  Hearing/Vision screen Hearing Screening - Comments:: No trouble hearing Vision Screening - Comments:: Up to date [facility]  Dietary issues and exercise activities discussed: Current Exercise Habits: The patient does not participate in regular exercise at present   Goals Addressed             This Visit's Progress    Patient Stated       No goal       Depression Screen    11/01/2021   10:16 AM 06/19/2021    2:44 PM 05/22/2021    2:17 PM 05/01/2021    1:26 PM 10/31/2020    6:00 PM 10/31/2020    5:54 PM 03/28/2020    8:15 AM  PHQ 2/9 Scores  PHQ - 2 Score 0 0 0 0 0 0 0  PHQ- 9 Score 0 1 0 2   0    Fall Risk    11/01/2021   10:15 AM 06/19/2021    2:44 PM 12/22/2020    2:28 PM 10/31/2020    5:59 PM 03/28/2020    8:15 AM  [place] in the past year? 0 1 0 0 0  Number falls in past yr: 0 0 0 0   Injury with Fall? 0 0 0  0   Risk for fall due to : No Fall Risks History of fall(s) No Fall Risks History of fall(s);No Fall Risks   Follow up Falls evaluation completed Falls evaluation completed Falls evaluation  completed Falls evaluation completed     [place]:  Any stairs in or around the home? Yes  If so, are there any without handrails? No  Home free of loose throw rugs in walkways, pet beds, electrical cords, etc? Yes  Adequate lighting in your home to reduce risk of falls? Yes   ASSISTIVE DEVICES UTILIZED TO PREVENT FALLS:  Life alert? No  Use of a cane, walker or w/c? Yes  Grab bars in the bathroom? Yes  Shower chair or bench in shower? No  Elevated toilet seat or a handicapped toilet? No   TIMED UP AND GO:  Was the test performed? No .    Cognitive Function:        11/01/2021   12:51 PM 10/31/2020    6:01 PM  6CIT Screen  What Year? 0 points 0 points  What month? 0 points 0 points  What time? 0 points 0 points  Count back from 20 0 points 0 points  Months in reverse 0 points 0 points  Repeat phrase 0 points 10 points  Total Score 0 points 10 points    Immunizations Immunization History  Administered Date(s) Administered   Fluad Quad(high Dose 65+) 03/29/2019, 11/25/2019, 11/22/2020   Influenza, High Dose Seasonal PF 01/19/2016, 11/08/2016, 11/07/2017   Influenza-Unspecified 11/07/2014   Moderna Sars-Covid-2 Vaccination 04/20/2020, 05/22/2020   Pneumococcal Conjugate-13 10/17/2015   Pneumococcal Polysaccharide-23 09/03/2011, 05/07/2017   Td 11/01/2021   Tdap 05/08/2010   Zoster, Live 03/25/2014    TDAP status: Up to date  Flu Vaccine status: Up to date  Pneumococcal vaccine status: Up to date  Covid-19 vaccine status: Information provided on how to obtain vaccines.   Qualifies for Shingles Vaccine? Yes   Zostavax completed Yes   Shingrix Completed?: No.    Education has been provided regarding the importance of this vaccine. Patient has been advised to call insurance company to determine out of pocket expense if they have not yet received this vaccine. Advised may also receive vaccine at local pharmacy or Health Dept.  Verbalized acceptance and understanding.  Screening Tests Health Maintenance  Topic Date Due   COVID-19 Vaccine (3 - Moderna series) 11/17/2021 (Originally 07/17/2020)   Zoster Vaccines- Shingrix (1 of 2) 01/31/2022 (Originally 05/31/2000)   INFLUENZA VACCINE  05/19/2022 (Originally 09/18/2021)   MAMMOGRAM  05/23/2022 (Originally 05/27/2011)   DEXA SCAN  05/23/2022 (Originally 06/01/2015)   COLONOSCOPY (Pts 45-71yrs Insurance coverage will need to be confirmed)  05/23/2022 (Originally 06/26/2019)   OPHTHALMOLOGY EXAM  01/25/2022   Diabetic kidney evaluation - Urine ACR  05/02/2022   HEMOGLOBIN A1C  05/02/2022   FOOT EXAM  05/10/2022   Diabetic kidney evaluation - GFR measurement  06/20/2022   TETANUS/TDAP  11/02/2031   Pneumonia Vaccine 42+ Years old  Completed   Hepatitis C Screening  Addressed   HPV VACCINES  Aged Out    Health Maintenance  There are no preventive care reminders to display for this patient.  Colorectal cancer screening: Referral to GI placed  . Pt aware the office will call re: appt.  Mammogram status: Ordered  . Pt provided with contact info and advised to call to schedule appt.   Bone Density status: Ordered  . Pt provided with contact info and  advised to call to schedule appt.  Lung Cancer Screening: (Low Dose CT Chest recommended if Age 35-80 years, 30 pack-year currently smoking OR have quit w/in 15years.) does not qualify.   Lung Cancer Screening Referral:   Additional Screening:  Hepatitis C Screening: does qualify;  Vision Screening: Recommended annual ophthalmology exams for early detection of glaucoma and other disorders of the eye. Is the patient up to date with their annual eye exam?  Yes  Who is the provider or what is the name of the office in which the patient attends annual eye exams? [facility] If pt is not established with a provider, would they like to be referred to a provider to establish care? No .   Dental Screening: Recommended annual dental  exams for proper oral hygiene  Community Resource Referral / Chronic Care Management: CRR required this visit?  No   CCM required this visit?  No      Plan:     I have personally reviewed and noted the following in the patient's chart:   Medical and social history Use of alcohol, tobacco or illicit drugs  Current medications and supplements including opioid prescriptions. Patient is not currently taking opioid prescriptions. Functional ability and status Nutritional status Physical activity Advanced directives List of other physicians Hospitalizations, surgeries, and ER visits in previous 12 months Vitals Screenings to include cognitive, depression, and falls Referrals and appointments  In addition, I have reviewed and discussed with patient certain preventive protocols, quality metrics, and best practice recommendations. A written personalized care plan for preventive services as well as general preventive health recommendations were provided to patient.     Leroy Kennedy, LPN   08/24/[id number]   Nurse Notes:

## 2021-11-01 NOTE — Progress Notes (Signed)
BP 128/82 (BP Location: Left Arm, Patient Position: Sitting, Cuff Size: Normal)   Pulse 87   Temp 97.7 F (36.5 C) (Oral)   Ht '5\' 5"'$  (1.651 m)   Wt 174 lb 8 oz (79.2 kg)   LMP  (LMP Unknown) Comment: age 71  SpO2 97%   BMI 29.04 kg/m    Subjective:    Patient ID: Cheryl Mendez, female    DOB: 11/01/1950, 71 y.o.   MRN: [id number]  HPI: Cheryl Mendez is a 71 y.o. female  Chief Complaint  Patient presents with   Hyperlipidemia   Hypertension   Diabetes   Knee Pain    Patient says she is starting to have issues with her knee again. Patient says her L knee tries to stop her from walking. Patient says she has a brace that she wears and she is wearing it more often and would like to discuss next steps as far as treatment options.    Medication Management    Patient says she was prescribed Meloxicam and says she does not feel it is helping and is requesting Zanaflex as she is needing it for her shoulder.    DIABETES A1c 6.8%, on last check.  Currently taking Rybelsus 7 MG (can not tolerate higher doses due to elevation in pancreas levels with this) and Metformin 1000 MG BID.  In past has taken Trulicity with GI issues and Invokana stopped due to foot concerns + Jardiance made her sick. Hypoglycemic episodes:no Polydipsia/polyuria: no Visual disturbance: no Chest pain: no Paresthesias: no Glucose Monitoring: no  Accucheck frequency: Not Checking - machine is broken  Fasting glucose:  Post prandial:  Evening:  Before meals: Taking Insulin?: no  Long acting insulin:  Short acting insulin: Blood Pressure Monitoring: not checking Retinal Examination: Up to Date Foot Exam: Up to Date Pneumovax: Up to Date Influenza: Up to Date Aspirin: no   HYPERTENSION / HYPERLIPIDEMIA Currently taking Lotensin 20-25 and Atorvastatin 10 MG. History of mild elevations periodically on LFTs, denies alcohol use. Satisfied with current treatment? yes Duration of hypertension: chronic BP  monitoring frequency: not checking BP range:  BP medication side effects: no Duration of hyperlipidemia: chronic Cholesterol medication side effects: no Cholesterol supplements: none Medication compliance: good compliance Aspirin: no Recent stressors: no Recurrent headaches: no Visual changes: no Palpitations: no Dyspnea: no Chest pain: no Lower extremity edema: no Dizzy/lightheaded: no The 10-year ASCVD risk score (Arnett DK, et al., 2019) is: 25.5%   Values used to calculate the score:     Age: 71 years     Sex: Female     Is Non-Hispanic African American: No     Diabetic: Yes     Tobacco smoker: No     Systolic Blood Pressure: 782 mmHg     Is BP treated: Yes     HDL Cholesterol: 44 mg/dL     Total Cholesterol: 183 mg/dL    KNEE PAIN (LEFT) & SHOULDER (RIGHT) Ongoing issue.  Provided knee steroid injection on 02/20/21.  Had imaging -- noted to have significant joint space loss, especially to medial aspect.  Pain improved with steroid injection for awhile.  Has seen [facility] in past for these pains.  Currently has been having pain again for 3 months. Duration: chronic Involved knee: left Mechanism of injury: unknown Location: medial Onset: sudden Severity: moderate Quality: 7/10 to both Frequency: None Radiation: no Aggravating factors: moving, bending, picking things up, walking up hill Status: worsening Treatments attempted:  Tizanidine,  Meloxicam, Aleeve, steroid injections Relief with NSAIDs?:   yes Weakness with weight bearing or walking: yes Sensation of giving way: yes Locking: yes Popping: yes Bruising: no Swelling: no Redness: no Paresthesias/decreased sensation: no Fevers: no    Relevant past medical, surgical, family and social history reviewed and updated as indicated. Interim medical history since our last visit reviewed. Allergies and medications reviewed and updated.  Review of Systems  Constitutional:  Negative for activity change, appetite  change, diaphoresis, fatigue and fever.  HENT: Negative.    Respiratory:  Negative for cough, chest tightness, shortness of breath and wheezing.   Cardiovascular:  Negative for chest pain, palpitations and leg swelling.  Gastrointestinal: Negative.   Endocrine: Negative for cold intolerance, heat intolerance, polydipsia, polyphagia and polyuria.  Musculoskeletal:  Positive for arthralgias.  Neurological: Negative.   Psychiatric/Behavioral: Negative.      Per HPI unless specifically indicated above     Objective:    BP 128/82 (BP Location: Left Arm, Patient Position: Sitting, Cuff Size: Normal)   Pulse 87   Temp 97.7 F (36.5 C) (Oral)   Ht '5\' 5"'$  (1.651 m)   Wt 174 lb 8 oz (79.2 kg)   LMP  (LMP Unknown) Comment: age 71  SpO2 97%   BMI 29.04 kg/m   Wt Readings from Last 3 Encounters:  11/01/21 174 lb 8 oz (79.2 kg)  06/19/21 173 lb 3.2 oz (78.6 kg)  05/22/21 170 lb 3.2 oz (77.2 kg)    Physical Exam Vitals and nursing note reviewed.  Constitutional:      General: She is awake. She is not in acute distress.    Appearance: She is well-developed and overweight. She is not ill-appearing.  HENT:     Head: Normocephalic.     Right Ear: Hearing normal.     Left Ear: Hearing normal.  Eyes:     General: Lids are normal.        Right eye: No discharge.        Left eye: No discharge.     Conjunctiva/sclera: Conjunctivae normal.     Pupils: Pupils are equal, round, and reactive to light.  Neck:     Thyroid: No thyromegaly.     Vascular: No carotid bruit or JVD.  Cardiovascular:     Rate and Rhythm: Normal rate and regular rhythm.     Heart sounds: Normal heart sounds. No murmur heard.    No gallop.  Pulmonary:     Effort: Pulmonary effort is normal. No accessory muscle usage or respiratory distress.     Breath sounds: Normal breath sounds.  Abdominal:     General: Bowel sounds are normal. There is no distension.     Palpations: Abdomen is soft.  Musculoskeletal:     Right  shoulder: Tenderness and crepitus present. No swelling. Decreased range of motion. Decreased strength.     Left shoulder: Normal.     Cervical back: Normal range of motion and neck supple.     Right knee: Normal.     Left knee: Crepitus present. No swelling or bony tenderness. Normal range of motion. Tenderness present over the medial joint line.     Instability Tests: Anterior drawer test negative. Posterior drawer test negative. Medial McMurray test negative and lateral McMurray test negative.     Right lower leg: No edema.     Left lower leg: No edema.  Lymphadenopathy:     Cervical: No cervical adenopathy.  Skin:    General: Skin is warm  and dry.  Neurological:     Mental Status: She is alert and oriented to person, place, and time.     Motor: Motor function is intact.     Coordination: Coordination is intact.     Gait: Gait is intact.     Deep Tendon Reflexes: Reflexes are normal and symmetric.     Reflex Scores:      Brachioradialis reflexes are 2+ on the right side and 2+ on the left side.      Patellar reflexes are 2+ on the right side and 2+ on the left side. Psychiatric:        Attention and Perception: Attention normal.        Mood and Affect: Mood normal.        Behavior: Behavior normal. Behavior is cooperative.        Thought Content: Thought content normal.        Judgment: Judgment normal.    Results for orders placed or performed in visit on 11/01/21  Bayer DCA Hb A1c Waived  Result Value Ref Range   HB A1C (BAYER DCA - WAIVED) 7.4 (H) 4.8 - 5.6 %      Assessment & Plan:   Problem List Items Addressed This Visit       Cardiovascular and Mediastinum   Hypertension associated with diabetes ([place])    Chronic, ongoing with BP below goal on recheck today.  Continue current medication regimen and adjust as needed -- Lotensin offering kidney protection.  Recommend checking BP at home three mornings a week and documenting for provider review.  CMP today.  Return in 3  months.      Relevant Medications   benazepril-hydrochlorthiazide (LOTENSIN HCT) 20-25 MG tablet   atorvastatin (LIPITOR) 10 MG tablet   Other Relevant Orders   Bayer DCA Hb A1c Waived (Completed)   Comprehensive metabolic panel     Endocrine   Hyperlipidemia associated with type 2 diabetes mellitus (HCC)    Chronic, ongoing.  Continue current medication regimen and adjust as needed.  Lipid panel today.      Relevant Medications   benazepril-hydrochlorthiazide (LOTENSIN HCT) 20-25 MG tablet   atorvastatin (LIPITOR) 10 MG tablet   Other Relevant Orders   Bayer DCA Hb A1c Waived (Completed)   Lipid Panel w/o Chol/HDL Ratio   Comprehensive metabolic panel   Poorly controlled type 2 diabetes mellitus ([place]) - Primary    Chronic, ongoing.  A1c today 7.4% slight trend up due to diet and missing Rybelsus for a period, previous was 6.8%. Urine ALB 27 April 2021. Continue Lotensin for kidney protection,  Metformin 1000 MG BID and Rybelsus 7 MG daily -- would like to avoid insulin, could consider SGLT2 in future and education on this. Continue diet changes at home. Recommend to monitor BS daily at home and document daily with fasting BS goal of <130.  Monitor diet closely with focus on diabetic diet and regular exercise.      Relevant Medications   benazepril-hydrochlorthiazide (LOTENSIN HCT) 20-25 MG tablet   atorvastatin (LIPITOR) 10 MG tablet   Other Relevant Orders   Bayer DCA Hb A1c Waived (Completed)     Musculoskeletal and Integument   Osteoarthritis of left knee    Ongoing issue, significant arthritic changes present.  Continue at home regimen and referral to ortho placed.      Relevant Medications   tiZANidine (ZANAFLEX) 4 MG tablet   Other Relevant Orders   Ambulatory referral to Orthopedics   Primary osteoarthritis  of right shoulder    Ongoing issue, significant arthritic changes present.  Continue at home regimen and referral to ortho placed.      Relevant Medications    tiZANidine (ZANAFLEX) 4 MG tablet   Other Relevant Orders   Ambulatory referral to Orthopedics   Other Visit Diagnoses     Vitamin D deficiency       History of low levels reported, check today and initiate supplement as needed.   Relevant Orders   VITAMIN D 25 Hydroxy (Vit-D Deficiency, Fractures)   Postmenopausal estrogen deficiency       DEXA scan ordered and discussed with patient to schedule both this and mammo.   Relevant Orders   DG Bone Density   Encounter for screening mammogram for malignant neoplasm of breast       Mammogram ordered.   Relevant Orders   MM 3D SCREEN BREAST BILATERAL   Colon cancer screening       GI referral placed and discussed with patient.   Relevant Orders   Ambulatory referral to Gastroenterology   Need for Td vaccine       Td vaccine today.   Relevant Orders   Td vaccine greater than or equal to 7yo preservative free IM (Completed)        Follow up plan: Return in about 3 months (around 01/31/2022) for T2DM, HTN/HLD, GERD, B12.

## 2021-11-02 DIAGNOSIS — M1712 Unilateral primary osteoarthritis, left knee: Secondary | ICD-10-CM | POA: Diagnosis not present

## 2021-11-02 LAB — COMPREHENSIVE METABOLIC PANEL
ALT: 32 IU/L (ref 0–32)
AST: 25 IU/L (ref 0–40)
Albumin/Globulin Ratio: 1.7 (ref 1.2–2.2)
Albumin: 4.6 g/dL (ref 3.8–4.8)
Alkaline Phosphatase: 47 IU/L (ref 44–121)
BUN/Creatinine Ratio: 17 (ref 12–28)
BUN: 11 mg/dL (ref 8–27)
Bilirubin Total: 0.4 mg/dL (ref 0.0–1.2)
CO2: 25 mmol/L (ref 20–29)
Calcium: 9.7 mg/dL (ref 8.7–10.3)
Chloride: 99 mmol/L (ref 96–106)
Creatinine, Ser: 0.64 mg/dL (ref 0.57–1.00)
Globulin, Total: 2.7 g/dL (ref 1.5–4.5)
Glucose: 116 mg/dL — ABNORMAL HIGH (ref 70–99)
Potassium: 4.2 mmol/L (ref 3.5–5.2)
Sodium: 143 mmol/L (ref 134–144)
Total Protein: 7.3 g/dL (ref 6.0–8.5)
eGFR: 94 mL/min/{1.73_m2} (ref >59)

## 2021-11-02 LAB — LIPID PANEL W/O CHOL/HDL RATIO
Cholesterol, Total: 169 mg/dL (ref 100–199)
HDL: 42 mg/dL (ref >39)
LDL Chol Calc (NIH): 86 mg/dL (ref 0–99)
Triglycerides: 245 mg/dL — ABNORMAL HIGH (ref 0–149)
VLDL Cholesterol Cal: 41 mg/dL — ABNORMAL HIGH (ref 5–40)

## 2021-11-02 LAB — VITAMIN D 25 HYDROXY (VIT D DEFICIENCY, FRACTURES): Vit D, 25-Hydroxy: 21.6 ng/mL — ABNORMAL LOW (ref 30.0–100.0)

## 2021-11-02 NOTE — Progress Notes (Signed)
Good morning, please let Cheryl Mendez know that labs have returned: - Kidney and liver function remain normal. - Cholesterol levels show stable LDL, but triglycerides are a bit elevated.  I do recommend taking a daily Omega 3 supplement daily for this.  Continue all current medications.  Any questions? Keep being amazing!!  Thank you for allowing me to participate in your care.  I appreciate you. Kindest regards, Treyshawn Muldrew

## 2021-11-02 NOTE — Telephone Encounter (Signed)
Handicap Placard paperwork was completed and patient was notified that is was ready for pick up. Patient verbalized understanding.

## 2021-11-08 ENCOUNTER — Telehealth: Payer: Self-pay

## 2021-11-08 ENCOUNTER — Other Ambulatory Visit: Payer: Self-pay

## 2021-11-08 DIAGNOSIS — Z86010 Personal history of colonic polyps: Secondary | ICD-10-CM

## 2021-11-08 MED ORDER — NA SULFATE-K SULFATE-MG SULF 17.5-3.13-1.6 GM/177ML PO SOLN: 1.0000 | Freq: Once | ORAL | 0 refills | Status: AC

## 2021-11-08 NOTE — Telephone Encounter (Signed)
Gastroenterology Pre-Procedure Review  Request Date: 12/31/21 Requesting Physician: Dr. Vicente Males  PATIENT REVIEW QUESTIONS: The patient responded to the following health history questions as indicated:    1. Are you having any GI issues? yes (some changes since taking Rybelsus. Started in March.  Experiences some stomach discomfort at times.) 2. Do you have a personal history of Polyps? yes (last colonoscopy was performed by Dr. Vicente Males 07/05/16 polyps were noted ) 3. Do you have a family history of Colon Cancer or Polyps? no 4. Diabetes Mellitus? yes (patient has been advised to hold metformin 2 days prior to colonoscopy, hold Rybelsus 1 day prior to colonoscopy) 5. Joint replacements in the past 12 months?no 6. Major health problems in the past 3 months?no 7. Any artificial heart valves, MVP, or defibrillator?no    MEDICATIONS & ALLERGIES:    Patient reports the following regarding taking any anticoagulation/antiplatelet therapy:   Plavix, Coumadin, Eliquis, Xarelto, Lovenox, Pradaxa, Brilinta, or Effient? no Aspirin? yes ($RemoveBefor'81mg'[id number]$  daily)  Patient confirms/reports the following medications:  Current Outpatient Medications  Medication Sig Dispense Refill   aspirin 81 MG chewable tablet Chew 81 mg by mouth daily.     atorvastatin (LIPITOR) 10 MG tablet TAKE 1 TABLET(10 MG) BY MOUTH DAILY 90 tablet 4   benazepril-hydrochlorthiazide (LOTENSIN HCT) 20-25 MG tablet Take 1 tablet by mouth daily. 90 tablet 4   Blood Glucose Monitoring Suppl (ONE TOUCH ULTRA 2) w/Device KIT Use to check blood sugar 3 times a day and document results, bring to appointments. Goal is <130 fasting blood sugar and <180 two hours after meals 1 kit 1   EPINEPHrine 0.3 mg/0.3 mL IJ SOAJ injection Inject 0.3 mg into the muscle as needed for anaphylaxis. 1 each 3   fluticasone (FLONASE) 50 MCG/ACT nasal spray Place 2 sprays into both nostrils daily. 16 g 6   loratadine (CLARITIN) 10 MG tablet TAKE 1 TABLET(10 MG) BY MOUTH DAILY 90  tablet 4   meloxicam (MOBIC) 7.5 MG tablet TAKE 1 TABLET(7.5 MG) BY MOUTH DAILY 30 tablet 2   metFORMIN (GLUCOPHAGE) 1000 MG tablet TAKE 1 TABLET(1000 MG) BY MOUTH TWICE DAILY WITH A MEAL 180 tablet 4   omeprazole (PRILOSEC) 20 MG capsule Take 40 mg by mouth daily.     ONETOUCH DELICA LANCETS FINE MISC USE UP TO FOUR TIMES DAILY AS DIRECTED 100 each 0   ONETOUCH ULTRA test strip TEST FOUR TIMES DAILY 300 strip 1   Semaglutide (RYBELSUS) 7 MG TABS Take 7 mg by mouth daily. 30 tablet 12   tiZANidine (ZANAFLEX) 4 MG tablet Take 1 tablet (4 mg total) by mouth every 6 (six) hours as needed for muscle spasms. 30 tablet 0   No current facility-administered medications for this visit.    Patient confirms/reports the following allergies:  Allergies  Allergen Reactions   Penicillin G Benzathine     No orders of the defined types were placed in this encounter.   AUTHORIZATION INFORMATION Primary Insurance: 1D#: Group #:  Secondary Insurance: 1D#: Group #:  SCHEDULE INFORMATION: Date: 12/31/21 Time: Location:

## 2021-11-21 ENCOUNTER — Telehealth: Payer: Self-pay | Admitting: Nurse Practitioner

## 2021-11-21 NOTE — Telephone Encounter (Signed)
Pt picked up forms.

## 2021-11-21 NOTE — Telephone Encounter (Signed)
PT dropped off paper to be filled out she is actually wanting a disabillity license plate.  Stated she would like call back when completed.  Put in provider's folder.

## 2021-11-21 NOTE — Telephone Encounter (Signed)
Patient is on her way to pick up form.

## 2021-11-28 ENCOUNTER — Telehealth: Payer: Self-pay

## 2021-11-28 NOTE — Telephone Encounter (Signed)
Patient contacted office to cancel her colonoscopy due to other matters she needs to take care.  She will call back next year to reschedule.   Colonoscopy has been canceled with Endo.  Thanks, [place], [place]

## 2021-12-26 ENCOUNTER — Other Ambulatory Visit: Payer: Medicare Other

## 2021-12-31 ENCOUNTER — Ambulatory Visit: Admit: 2021-12-31 | Payer: Medicare Other | Admitting: Gastroenterology

## 2021-12-31 SURGERY — COLONOSCOPY WITH PROPOFOL
Anesthesia: General

## 2022-01-27 NOTE — Patient Instructions (Incomplete)
Diabetes Mellitus Basics  Diabetes mellitus, or diabetes, is a long-term (chronic) disease. It occurs when the body does not properly use sugar (glucose) that is released from food after you eat. Diabetes mellitus may be caused by one or both of these problems: Your pancreas does not make enough of a hormone called insulin. Your body does not react in a normal way to the insulin that it makes. Insulin lets glucose enter cells in your body. This gives you energy. If you have diabetes, glucose cannot get into cells. This causes high blood glucose (hyperglycemia). How to treat and manage diabetes You may need to take insulin or other diabetes medicines daily to keep your glucose in balance. If you are prescribed insulin, you will learn how to give yourself insulin by injection. You may need to adjust the amount of insulin you take based on the foods that you eat. You will need to check your blood glucose levels using a glucose monitor as told by your health care provider. The readings can help determine if you have low or high blood glucose. Generally, you should have these blood glucose levels: Before meals (preprandial): 80-130 mg/dL (4.4-7.2 mmol/L). After meals (postprandial): below 180 mg/dL (10 mmol/L). Hemoglobin A1c (HbA1c) level: less than 7%. Your health care provider will set treatment goals for you. Keep all follow-up visits. This is important. Follow these instructions at home: Diabetes medicines Take your diabetes medicines every day as told by your health care provider. List your diabetes medicines here: Name of medicine: ______________________________ Amount (dose): _______________ Time (a.m./p.m.): _______________ Notes: ___________________________________ Name of medicine: ______________________________ Amount (dose): _______________ Time (a.m./p.m.): _______________ Notes: ___________________________________ Name of medicine: ______________________________ Amount (dose):  _______________ Time (a.m./p.m.): _______________ Notes: ___________________________________ Insulin If you use insulin, list the types of insulin you use here: Insulin type: ______________________________ Amount (dose): _______________ Time (a.m./p.m.): _______________Notes: ___________________________________ Insulin type: ______________________________ Amount (dose): _______________ Time (a.m./p.m.): _______________ Notes: ___________________________________ Insulin type: ______________________________ Amount (dose): _______________ Time (a.m./p.m.): _______________ Notes: ___________________________________ Insulin type: ______________________________ Amount (dose): _______________ Time (a.m./p.m.): _______________ Notes: ___________________________________ Insulin type: ______________________________ Amount (dose): _______________ Time (a.m./p.m.): _______________ Notes: ___________________________________ Managing blood glucose  Check your blood glucose levels using a glucose monitor as told by your health care provider. Write down the times that you check your glucose levels here: Time: _______________ Notes: ___________________________________ Time: _______________ Notes: ___________________________________ Time: _______________ Notes: ___________________________________ Time: _______________ Notes: ___________________________________ Time: _______________ Notes: ___________________________________ Time: _______________ Notes: ___________________________________  Low blood glucose Low blood glucose (hypoglycemia) is when glucose is at or below 70 mg/dL (3.9 mmol/L). Symptoms may include: Feeling: Hungry. Sweaty and clammy. Irritable or easily upset. Dizzy. Sleepy. Having: A fast heartbeat. A headache. A change in your vision. Numbness around the mouth, lips, or tongue. Having trouble with: Moving (coordination). Sleeping. Treating low blood glucose To treat low blood  glucose, eat or drink something containing sugar right away. If you can think clearly and swallow safely, follow the 15:15 rule: Take 15 grams of a fast-acting carb (carbohydrate), as told by your health care provider. Some fast-acting carbs are: Glucose tablets: take 3-4 tablets. Hard candy: eat 3-5 pieces. Fruit juice: drink 4 oz (120 mL). Regular (not diet) soda: drink 4-6 oz (120-180 mL). Honey or sugar: eat 1 Tbsp (15 mL). Check your blood glucose levels 15 minutes after you take the carb. If your glucose is still at or below 70 mg/dL (3.9 mmol/L), take 15 grams of a carb again. If your glucose does not go above 70 mg/dL (3.9 mmol/L) after   3 tries, get help right away. After your glucose goes back to normal, eat a meal or a snack within 1 hour. Treating very low blood glucose If your glucose is at or below 54 mg/dL (3 mmol/L), you have very low blood glucose (severe hypoglycemia). This is an emergency. Do not wait to see if the symptoms will go away. Get medical help right away. Call your local emergency services (911 in the [place] Do not drive yourself to the hospital. Questions to ask your health care provider Should I talk with a diabetes educator? What equipment will I need to care for myself at home? What diabetes medicines do I need? When should I take them? How often do I need to check my blood glucose levels? What number can I call if I have questions? When is my follow-up visit? Where can I find a support group for people with diabetes? Where to find more information American Diabetes Association: www.diabetes.org Association of Diabetes Care and Education Specialists: www.diabeteseducator.org Contact a health care provider if: Your blood glucose is at or above 240 mg/dL (13.3 mmol/L) for 2 days in a row. You have been sick or have had a fever for 2 days or more, and you are not getting better. You have any of these problems for more than 6 hours: You cannot eat or  drink. You feel nauseous. You vomit. You have diarrhea. Get help right away if: Your blood glucose is lower than 54 mg/dL (3 mmol/L). You get confused. You have trouble thinking clearly. You have trouble breathing. These symptoms may represent a serious problem that is an emergency. Do not wait to see if the symptoms will go away. Get medical help right away. Call your local emergency services (911 in the [place] Do not drive yourself to the hospital. Summary Diabetes mellitus is a chronic disease that occurs when the body does not properly use sugar (glucose) that is released from food after you eat. Take insulin and diabetes medicines as told. Check your blood glucose every day, as often as told. Keep all follow-up visits. This is important. This information is not intended to replace advice given to you by your health care provider. Make sure you discuss any questions you have with your health care provider. Document Revised: 06/08/2019 Document Reviewed: 06/08/2019 Elsevier Patient Education  2023 [employer] Inc[employer]  

## 2022-01-31 ENCOUNTER — Ambulatory Visit: Payer: Medicare Other | Admitting: Nurse Practitioner

## 2022-01-31 DIAGNOSIS — E1165 Type 2 diabetes mellitus with hyperglycemia: Secondary | ICD-10-CM

## 2022-01-31 DIAGNOSIS — K219 Gastro-esophageal reflux disease without esophagitis: Secondary | ICD-10-CM

## 2022-01-31 DIAGNOSIS — E1169 Type 2 diabetes mellitus with other specified complication: Secondary | ICD-10-CM

## 2022-01-31 DIAGNOSIS — I152 Hypertension secondary to endocrine disorders: Secondary | ICD-10-CM

## 2022-01-31 DIAGNOSIS — E538 Deficiency of other specified B group vitamins: Secondary | ICD-10-CM

## 2022-01-31 DIAGNOSIS — Z23 Encounter for immunization: Secondary | ICD-10-CM

## 2022-02-04 ENCOUNTER — Ambulatory Visit: Payer: Self-pay

## 2022-02-04 NOTE — Telephone Encounter (Signed)
  Chief Complaint: leg pain  Symptoms: RLE discomfort with 4 blood spots on lower leg  Frequency: the other morning Pertinent Negatives: Patient denies no active bleeding  Disposition: '[]'$ ED /'[]'$ Urgent Care (no appt availability in office) / '[x]'$ Appointment(In office/virtual)/ '[]'$  [place] [place] [facility]/ '[]'$ Home Care/ '[]'$ Refused Recommended Disposition /'[]'$ [place] [facility]/ '[]'$  Follow-up with PCP Additional Notes: pt states she took sock off the other morning and noticed spots, discomfort but no swelling or active bleeding. Helped a friend move and doesn't remember hitting on anything but unsure and wanting it looked at for reassurance since she hasn't seen anything like that before. Also says its where she has the most varicose veins on her legs. Scheduled first available appt on 02/07/22 at 0900 and added to waitlist. Advised if starts bleeding or gets worse to call back or go to UC. Pt states she will call Paramedics if she has to.    Reason for Disposition  Localized pain, redness or hard lump along vein  Answer Assessment - Initial Assessment Questions 1. ONSET: "When did the pain start?"      Started the other  2. LOCATION: "Where is the pain located?"      R leg,  4 spots looking like blood pooling  3. PAIN: "How bad is the pain?"    (Scale 1-10; or mild, moderate, severe)   -  MILD (1-3): doesn't interfere with normal activities    -  MODERATE (4-7): interferes with normal activities (e.g., work or school) or awakens from sleep, limping    -  SEVERE (8-10): excruciating pain, unable to do any normal activities, unable to walk     Discomfort 3-4 6. OTHER SYMPTOMS: "Do you have any other symptoms?" (e.g., chest pain, back pain, breathing difficulty, swelling, rash, fever, numbness, weakness)     no  Protocols used: Leg Pain-A-AH

## 2022-02-07 ENCOUNTER — Encounter: Payer: Self-pay | Admitting: Physician Assistant

## 2022-02-07 ENCOUNTER — Ambulatory Visit (INDEPENDENT_AMBULATORY_CARE_PROVIDER_SITE_OTHER): Payer: Medicare Other | Admitting: Physician Assistant

## 2022-02-07 VITALS — BP 123/79 | HR 92 | Temp 97.8°F | Wt 164.8 lb

## 2022-02-07 DIAGNOSIS — T148XXA Other injury of unspecified body region, initial encounter: Secondary | ICD-10-CM

## 2022-02-07 DIAGNOSIS — I83812 Varicose veins of left lower extremities with pain: Secondary | ICD-10-CM | POA: Insufficient documentation

## 2022-02-07 NOTE — Assessment & Plan Note (Signed)
Chronic, ongoing Reports long-standing varicose veins in both extremities but left is worse with tortuous veins to level of knee and thigh on exam She presents today with superficial skin ulcerations - one of which is over an area of varicose vein  Will refer to Vascular for evaluation and potential management  Reviewed management of ulcerations to prevent further complications  Follow up as needed for concerns

## 2022-02-07 NOTE — Patient Instructions (Addendum)
   For the wounds on your leg please do the following:  Keep the area clean and dry- warm water and gentle soap- no scrubbing, alcohol, peroxide, or neosporin You can use Aquaphor or Vaseline on the areas to help with keeping the skin moist and to help it heal quicker  If you notice the area around it gets red, starts to drain with pus-like fluid, bleed or gets bigger please come back into the office   It was nice to meet you and I appreciate the opportunity to be involved in your care If you were satisfied with the care you received from me, I would greatly appreciate you saying so in the after-visit survey that is sent out following our visit.

## 2022-02-07 NOTE — Progress Notes (Signed)
Acute Office Visit   Patient: Cheryl Mendez   DOB: [birth year]   71 y.o. Female  MRN: [id number] Visit Date: 02/07/2022  Today's healthcare provider: Oswaldo Conroy Sade Hollon, PA-C  Introduced myself to the patient as a Secondary school teacher and provided education on APPs in clinical practice.    Chief Complaint  Patient presents with   Leg Sores    Pt states she has noticed some sores on the bottom of her R lower leg that she noticed a few days ago. Patient is unsure of how she got these sores, but states that they are painful sometimes.    Subjective    HPI HPI     Leg Sores    Additional comments: Pt states she has noticed some sores on the bottom of her R lower leg that she noticed a few days ago. Patient is unsure of how she got these sores, but states that they are painful sometimes.       Last edited by Pablo Ledger, CMA on 02/07/2022  8:58 AM.       Sores on right lower leg Onset:sudden- states she is not sure when or how they happened Reports she has been helping a friend move recently  Duration: 3-4 days  Location: right lower shin  Reports they seem to be getting smaller Denies bleeding or drainage Radiation: NA Similar symptoms in others in home: no Swelling: no Warmth to area: no Interventions: none so far other than keeping area clean   A1c: 7.4 in Sept   Medications: Outpatient Medications Prior to Visit  Medication Sig   aspirin 81 MG chewable tablet Chew 81 mg by mouth daily.   atorvastatin (LIPITOR) 10 MG tablet TAKE 1 TABLET(10 MG) BY MOUTH DAILY   benazepril-hydrochlorthiazide (LOTENSIN HCT) 20-25 MG tablet Take 1 tablet by mouth daily.   Blood Glucose Monitoring Suppl (ONE TOUCH ULTRA 2) w/Device KIT Use to check blood sugar 3 times a day and document results, bring to appointments. Goal is <130 fasting blood sugar and <180 two hours after meals   EPINEPHrine 0.3 mg/0.3 mL IJ SOAJ injection Inject 0.3 mg into the muscle as needed for anaphylaxis.    fluticasone (FLONASE) 50 MCG/ACT nasal spray Place 2 sprays into both nostrils daily.   loratadine (CLARITIN) 10 MG tablet TAKE 1 TABLET(10 MG) BY MOUTH DAILY   meloxicam (MOBIC) 7.5 MG tablet TAKE 1 TABLET(7.5 MG) BY MOUTH DAILY   metFORMIN (GLUCOPHAGE) 1000 MG tablet TAKE 1 TABLET(1000 MG) BY MOUTH TWICE DAILY WITH A MEAL   omeprazole (PRILOSEC) 20 MG capsule Take 40 mg by mouth daily.   ONETOUCH DELICA LANCETS FINE MISC USE UP TO FOUR TIMES DAILY AS DIRECTED   ONETOUCH ULTRA test strip TEST FOUR TIMES DAILY   Semaglutide (RYBELSUS) 7 MG TABS Take 7 mg by mouth daily.   tiZANidine (ZANAFLEX) 4 MG tablet Take 1 tablet (4 mg total) by mouth every 6 (six) hours as needed for muscle spasms.   No facility-administered medications prior to visit.    Review of Systems  Skin:  Positive for wound.       Multiple wounds of left shin        Objective    BP 123/79   Pulse 92   Temp 97.8 F (36.6 C) (Oral)   Wt 164 lb 12.8 oz (74.8 kg)   LMP  (LMP Unknown) Comment: age 72  SpO2 99%   BMI 27.42 kg/m  Physical Exam Vitals reviewed.  Constitutional:      General: She is awake.     Appearance: Normal appearance. She is well-developed and well-groomed.  HENT:     Head: Normocephalic and atraumatic.  Cardiovascular:     Pulses:          Posterior tibial pulses are 2+ on the left side.     Comments: Numerous varicose veins in lower left leg and foot  Skin is thin and dry  Two scabbed, superficial wounds to anterior shin (approx 1 cm in diameter per wound)  No redness, drainage, bleeding or swelling to wounds  No streaking  Mild tenderness to palpation of wounds Musculoskeletal:     Right lower leg: No edema.     Left lower leg: No edema.  Feet:     Right foot:     Skin integrity: Dry skin present.     Left foot:     Skin integrity: Dry skin present.  Skin:    General: Skin is warm and dry.     Capillary Refill: Capillary refill takes less than 2 seconds.  Neurological:      General: No focal deficit present.     Mental Status: She is alert and oriented to person, place, and time.  Psychiatric:        Mood and Affect: Mood normal.        Behavior: Behavior normal. Behavior is cooperative.        Thought Content: Thought content normal.        Judgment: Judgment normal.       No results found for any visits on 02/07/22.  Assessment & Plan      No follow-ups on file.        Problem List Items Addressed This Visit       Cardiovascular and Mediastinum   Varicose veins of left lower extremity with pain    Chronic, ongoing Reports long-standing varicose veins in both extremities but left is worse with tortuous veins to level of knee and thigh on exam She presents today with superficial skin ulcerations - one of which is over an area of varicose vein  Will refer to Vascular for evaluation and potential management  Reviewed management of ulcerations to prevent further complications  Follow up as needed for concerns      Relevant Orders   Ambulatory referral to Vascular Surgery   Other Visit Diagnoses     Superficial injury of skin    -  Primary Acute, new concern Unsure if this is related to varicose veins vs superficial injury to skin from hitting something  Wounds appear to be well-healing at this time without evidence of drainage, erythema, swelling, bleeding Reviewed wound care with her- recommend keeping area clean with warm water and gentle soap, dry and using Vaseline or Aquaphor to keep scabbing moist as well as prevent itching She voiced understanding of plan and would also like to proceed with Vascular referral for varicose veins Follow up as needed for persistent or progressing symptoms         No follow-ups on file.   I, Lolah Coghlan E Mamie Hundertmark, PA-C, have reviewed all documentation for this visit. The documentation on 02/07/22 for the exam, diagnosis, procedures, and orders are all accurate and complete.   Jacquelin Hawking, MHS,  PA-C [facility]

## 2022-02-12 ENCOUNTER — Ambulatory Visit: Payer: Self-pay

## 2022-02-12 ENCOUNTER — Other Ambulatory Visit: Payer: Self-pay

## 2022-02-12 DIAGNOSIS — E871 Hypo-osmolality and hyponatremia: Secondary | ICD-10-CM | POA: Insufficient documentation

## 2022-02-12 DIAGNOSIS — E119 Type 2 diabetes mellitus without complications: Secondary | ICD-10-CM | POA: Diagnosis not present

## 2022-02-12 DIAGNOSIS — J101 Influenza due to other identified influenza virus with other respiratory manifestations: Secondary | ICD-10-CM | POA: Insufficient documentation

## 2022-02-12 DIAGNOSIS — E878 Other disorders of electrolyte and fluid balance, not elsewhere classified: Secondary | ICD-10-CM | POA: Diagnosis not present

## 2022-02-12 DIAGNOSIS — R197 Diarrhea, unspecified: Secondary | ICD-10-CM | POA: Diagnosis not present

## 2022-02-12 DIAGNOSIS — R748 Abnormal levels of other serum enzymes: Secondary | ICD-10-CM | POA: Diagnosis not present

## 2022-02-12 DIAGNOSIS — I10 Essential (primary) hypertension: Secondary | ICD-10-CM | POA: Insufficient documentation

## 2022-02-12 DIAGNOSIS — Z03818 Encounter for observation for suspected exposure to other biological agents ruled out: Secondary | ICD-10-CM | POA: Diagnosis not present

## 2022-02-12 DIAGNOSIS — E876 Hypokalemia: Secondary | ICD-10-CM | POA: Insufficient documentation

## 2022-02-12 DIAGNOSIS — R112 Nausea with vomiting, unspecified: Secondary | ICD-10-CM | POA: Diagnosis not present

## 2022-02-12 DIAGNOSIS — E860 Dehydration: Secondary | ICD-10-CM | POA: Diagnosis not present

## 2022-02-12 LAB — CBC
HCT: 40.5 % (ref 36.0–46.0)
Hemoglobin: 13.4 g/dL (ref 12.0–15.0)
MCH: 29.5 pg (ref 26.0–34.0)
MCHC: 33.1 g/dL (ref 30.0–36.0)
MCV: 89 fL (ref 80.0–100.0)
Platelets: 299 10*3/uL (ref 150–400)
RBC: 4.55 MIL/uL (ref 3.87–5.11)
RDW: 12.1 % (ref 11.5–15.5)
WBC: 4.4 10*3/uL (ref 4.0–10.5)
nRBC: 0 % (ref 0.0–0.2)

## 2022-02-12 LAB — COMPREHENSIVE METABOLIC PANEL
ALT: 30 U/L (ref 0–44)
AST: 36 U/L (ref 15–41)
Albumin: 4.3 g/dL (ref 3.5–5.0)
Alkaline Phosphatase: 33 U/L — ABNORMAL LOW (ref 38–126)
Anion gap: 12 (ref 5–15)
BUN: 12 mg/dL (ref 8–23)
CO2: 30 mmol/L (ref 22–32)
Calcium: 8.9 mg/dL (ref 8.9–10.3)
Chloride: 91 mmol/L — ABNORMAL LOW (ref 98–111)
Creatinine, Ser: 0.57 mg/dL (ref 0.44–1.00)
GFR, Estimated: >60 mL/min (ref >60)
Glucose, Bld: 132 mg/dL — ABNORMAL HIGH (ref 70–99)
Potassium: 2.8 mmol/L — ABNORMAL LOW (ref 3.5–5.1)
Sodium: 133 mmol/L — ABNORMAL LOW (ref 135–145)
Total Bilirubin: 1.2 mg/dL (ref 0.3–1.2)
Total Protein: 7.5 g/dL (ref 6.5–8.1)

## 2022-02-12 LAB — LIPASE, BLOOD: Lipase: 54 U/L — ABNORMAL HIGH (ref 11–51)

## 2022-02-12 NOTE — Telephone Encounter (Signed)
Patient made aware of Provider's recommendations and verbalized understanding.   

## 2022-02-12 NOTE — Telephone Encounter (Signed)
    Chief Complaint: Cough, congestion. No availability until Friday. Asking for cough medication and what else she can take. Symptoms: Above Frequency: 5 days ago Pertinent Negatives: Patient denies fever Disposition: '[]'$ ED /'[]'$ Urgent Care (no appt availability in office) / '[]'$ Appointment(In office/virtual)/ '[]'$  [facility] [place] [facility]/ '[]'$ Home Care/ '[]'$ Refused Recommended Disposition /'[]'$ [place] [facility]/ '[x]'$  Follow-up with PCP Additional Notes: Please advise pt. Refuses UC.  Answer Assessment - Initial Assessment Questions 1. ONSET: "When did the cough begin?"      5 days ago 2. SEVERITY: "How bad is the cough today?"      Severe 3. SPUTUM: "Describe the color of your sputum" (none, dry cough; clear, white, yellow, green)     Green 4. HEMOPTYSIS: "Are you coughing up any blood?" If so ask: "How much?" (flecks, streaks, tablespoons, etc.)     No 5. DIFFICULTY BREATHING: "Are you having difficulty breathing?" If Yes, ask: "How bad is it?" (e.g., mild, moderate, severe)    - MILD: No SOB at rest, mild SOB with walking, speaks normally in sentences, can lie down, no retractions, pulse < 100.    - MODERATE: SOB at rest, SOB with minimal exertion and prefers to sit, cannot lie down flat, speaks in phrases, mild retractions, audible wheezing, pulse 100-120.    - SEVERE: Very SOB at rest, speaks in single words, struggling to breathe, sitting hunched forward, retractions, pulse > 120      Mild 6. FEVER: "Do you have a fever?" If Yes, ask: "What is your temperature, how was it measured, and when did it start?"     No 7. CARDIAC HISTORY: "Do you have any history of heart disease?" (e.g., heart attack, congestive heart failure)      No 8. LUNG HISTORY: "Do you have any history of lung disease?"  (e.g., pulmonary embolus, asthma, emphysema)     No 9. PE RISK FACTORS: "Do you have a history of blood clots?" (or: recent major surgery, recent prolonged travel, bedridden)     No 10. OTHER SYMPTOMS:  "Do you have any other symptoms?" (e.g., runny nose, wheezing, chest pain)       Congestion 11. PREGNANCY: "Is there any chance you are pregnant?" "When was your last menstrual period?"       No 12. TRAVEL: "Have you traveled out of the country in the last month?" (e.g., travel history, exposures)       No  Protocols used: Cough - Acute Productive-A-AH

## 2022-02-12 NOTE — ED Triage Notes (Signed)
Pt comes from home via POV c/o abd pain and vomiting. Pt reports mid abd pain and throwing up since Wednesday. pt tested postive for flu today. Denies CP, SOB. Pt in NAD at this time

## 2022-02-13 ENCOUNTER — Emergency Department
Admission: EM | Admit: 2022-02-13 | Discharge: 2022-02-13 | Disposition: A | Discharge status: Home / Self Care | Payer: Medicare Other | Attending: Emergency Medicine | Admitting: Emergency Medicine

## 2022-02-13 DIAGNOSIS — J111 Influenza due to unidentified influenza virus with other respiratory manifestations: Secondary | ICD-10-CM

## 2022-02-13 DIAGNOSIS — R112 Nausea with vomiting, unspecified: Secondary | ICD-10-CM

## 2022-02-13 LAB — URINALYSIS, ROUTINE W REFLEX MICROSCOPIC
Bilirubin Urine: NEGATIVE
Glucose, UA: NEGATIVE mg/dL
Hgb urine dipstick: NEGATIVE
Ketones, ur: NEGATIVE mg/dL
Leukocytes,Ua: NEGATIVE
Nitrite: NEGATIVE
Protein, ur: 30 mg/dL — AB
Specific Gravity, Urine: 1.02 (ref 1.005–1.030)
pH: 5 (ref 5.0–8.0)

## 2022-02-13 LAB — MAGNESIUM: Magnesium: 1.9 mg/dL (ref 1.7–2.4)

## 2022-02-13 MED ORDER — SODIUM CHLORIDE 0.9 % IV BOLUS
1000.0000 mL | Freq: Once | INTRAVENOUS | Status: AC
  Administered 2022-02-13: 1000 mL via INTRAVENOUS

## 2022-02-13 MED ORDER — ONDANSETRON 4 MG PO TBDP: 4.0000 mg | ORAL_TABLET | Freq: Four times a day (QID) | ORAL | 0 refills | Status: DC | PRN

## 2022-02-13 MED ORDER — ONDANSETRON HCL 4 MG/2ML IJ SOLN
4.0000 mg | INTRAMUSCULAR | Status: AC
  Administered 2022-02-13: 4 mg via INTRAVENOUS
  Filled 2022-02-13: qty 2

## 2022-02-13 MED ORDER — POTASSIUM CHLORIDE CRYS ER 20 MEQ PO TBCR
40.0000 meq | EXTENDED_RELEASE_TABLET | Freq: Once | ORAL | Status: AC
  Administered 2022-02-13: 40 meq via ORAL
  Filled 2022-02-13: qty 2

## 2022-02-13 MED ORDER — ONDANSETRON 4 MG PO TBDP
4.0000 mg | ORAL_TABLET | Freq: Once | ORAL | Status: AC
  Administered 2022-02-13: 4 mg via ORAL
  Filled 2022-02-13: qty 1

## 2022-02-13 NOTE — ED Provider Notes (Signed)
[facility] Provider Note    Event Date/Time   First MD Initiated Contact with Patient 02/13/22 0745     (approximate)   History   Abdominal Pain   HPI  Cheryl Mendez is a 71 y.o. female who on review of primary care notes is a history of varicose veins, hypertension, poorly controlled type 2 diabetes, hyperlipidemia   On review of clinic note yesterday morning reports patient had a nurse calling reporting 5 days of coughing with sputum production.  Was referred to primary care doctor.  Also over the last 2 to 3 days has been having intermittent nausea, occasional small months of vomiting, and diarrhea.  She reports that she went to urgent care yesterday and was diagnosed on a swab of having positive influenza test  She reports her stomach is sore, but mostly it is nausea and difficulty eating and loose nonbloody stool.  She is not having any trouble breathing.  Cough seems to actually be improving  Physical Exam   Triage Vital Signs: ED Triage Vitals  Enc Vitals Group     BP 02/12/22 1912 104/76     Pulse Rate 02/12/22 1912 (!) 101     Resp 02/12/22 1912 18     Temp 02/12/22 1912 98.2 F (36.8 C)     Temp Source 02/12/22 1912 Oral     SpO2 02/12/22 1912 93 %     Weight --      Height --      Head Circumference --      Peak Flow --      Pain Score 02/12/22 1925 7     Pain Loc --      Pain Edu? --      Excl. in [place]? --     Most recent vital signs: Vitals:   02/13/22 0312 02/13/22 0530  BP: 100/85 122/79  Pulse: 98 92  Resp: 20 18  Temp: 98.2 F (36.8 C) 98.3 F (36.8 C)  SpO2: 96% 100%     General: Awake, no distress.  Occasional dry heaving, but no active emesis.  She appears mildly ill but in no distress.  She is very pleasant along with her visitor CV:  Good peripheral perfusion.  Normal heart tones and rate Resp:  Normal effort.  Clear bilaterally Abd:  No distention.  Soft nondistended.  She reports mild tenderness  throughout she reports it feels like her muscles are "sore from vomiting" but has no focal tenderness or peritonitis.  Nothing that would be suggestive of a focal abdominal pain.  No focal pain in the right lower quadrant no focal left lower quadrant pain.  Rather more of a mild discomfort to palpation.  Patient reports it is not painful except when she gets nauseated and has to vomit. Other:  Skin warm and well-perfused.  Mucous membranes slightly dry  Has been able to tolerate drinking ginger ale, as well as taking potassium tablets here at the ER   ED Results / Procedures / Treatments   Labs (all labs ordered are listed, but only abnormal results are displayed) Labs Reviewed  LIPASE, BLOOD - Abnormal; Notable for the following components:      Result Value   Lipase 54 (*)    All other components within normal limits  COMPREHENSIVE METABOLIC PANEL - Abnormal; Notable for the following components:   Sodium 133 (*)    Potassium 2.8 (*)    Chloride 91 (*)    Glucose, Bld 132 (*)  Alkaline Phosphatase 33 (*)    All other components within normal limits  URINALYSIS, ROUTINE W REFLEX MICROSCOPIC - Abnormal; Notable for the following components:   Color, Urine AMBER (*)    APPearance HAZY (*)    Protein, ur 30 (*)    Bacteria, UA RARE (*)    All other components within normal limits  CBC  MAGNESIUM   Labs reviewed notable for mild to moderate hypokalemia, slight hypochloremia and hyponatremia, stable renal function.  Minimally elevated lipase.  CBC is normal.  Patient self-reports a positive flu test  PROCEDURES:  Critical Care performed: No  Procedures   MEDICATIONS ORDERED IN ED: Medications  ondansetron (ZOFRAN-ODT) disintegrating tablet 4 mg (4 mg Oral Given 02/13/22 0524)  potassium chloride SA (KLOR-CON M) CR tablet 40 mEq (40 mEq Oral Given 02/13/22 0523)  sodium chloride 0.9 % bolus 1,000 mL (1,000 mLs Intravenous New Bag/Given 02/13/22 0817)  ondansetron (ZOFRAN)  injection 4 mg (4 mg Intravenous Given 02/13/22 0817)     IMPRESSION / MDM / ASSESSMENT AND PLAN / ED COURSE  I reviewed the triage vital signs and the nursing notes.                              Differential diagnosis includes, but is not limited to, influenza, gastroenteritis, colitis, gastritis, viral syndrome, pneumonia, obstruction, etc.  Differential diagnosis somewhat broad but also knowing that the patient is tested positive for influenza and with her symptoms of cough congestion now followed by nausea vomiting and loose stools this seems likely consistent with influenza.  She has a reassuring abdominal exam.  She denies respiratory symptoms and her lung sounds are clear to for her occasional cough, no clinical evidence that would support acute pneumonia.  Vital signs stable.  ----------------------------------------- 8:53 AM on 02/13/2022 ----------------------------------------- Patient has received 1 L of normal saline, Zofran, and a prescription for Zofran has been sent in.  Discussed with her and her visitor careful return precautions and follow-up recommendations.  She is alert well-oriented tolerating by mouth and does not appear to be in acute distress or extremis, appears appropriate nontoxic for ongoing outpatient treatment for influenza  Patient's presentation is most consistent with acute complicated illness / injury requiring diagnostic workup.      Return precautions and treatment recommendations and follow-up discussed with the patient who is agreeable with the plan.      FINAL CLINICAL IMPRESSION(S) / ED DIAGNOSES   Final diagnoses:  Influenza  Nausea vomiting and diarrhea     Rx / DC Orders   ED Discharge Orders          Ordered    ondansetron (ZOFRAN-ODT) 4 MG disintegrating tablet  Every 6 hours PRN        02/13/22 0848             Note:  This document was prepared using Dragon voice recognition software and may include unintentional  dictation errors.   Delman Kitten, MD 02/13/22 [phone]

## 2022-02-13 NOTE — Discharge Instructions (Addendum)
You were diagnosed with the flu (influenza).  You will feel ill for as much as a week or a bit more.  Please take any prescribed medications as instructed, and you may use over-the-counter Tylenol and/or ibuprofen as needed according to label instructions (unless you have an allergy to either or have been told by your doctor not to take them).  Follow up with your physician as instructed above, and return to the Emergency Department (ED) if you are unable to tolerate fluids due to vomiting, have worsening trouble breathing, become extremely tired or difficult to awaken, or if you develop any other symptoms that concern you.

## 2022-02-13 NOTE — ED Notes (Signed)
Patient discharged to home per MD order. Patient in stable condition, and deemed medically cleared by ED provider for discharge. Discharge instructions reviewed with patient/family using "Teach Back"; verbalized understanding of medication education and administration, and information about follow-up care. Denies further concerns. ° °

## 2022-02-15 ENCOUNTER — Telehealth (INDEPENDENT_AMBULATORY_CARE_PROVIDER_SITE_OTHER): Payer: Medicare Other | Admitting: Family Medicine

## 2022-02-15 ENCOUNTER — Encounter: Payer: Self-pay | Admitting: Family Medicine

## 2022-02-15 DIAGNOSIS — J101 Influenza due to other identified influenza virus with other respiratory manifestations: Secondary | ICD-10-CM | POA: Diagnosis not present

## 2022-02-15 MED ORDER — BENZONATATE 200 MG PO CAPS: 200.0000 mg | ORAL_CAPSULE | Freq: Two times a day (BID) | ORAL | 0 refills | Status: DC | PRN

## 2022-02-15 NOTE — Progress Notes (Signed)
LMP  (LMP Unknown) Comment: age 71   Subjective:    Patient ID: Cheryl Mendez, female    DOB: [birth year], 71 y.o.   MRN: [id number]  HPI: Cheryl Mendez is a 71 y.o. female  Chief Complaint  Patient presents with   Influenza    Patient says she tested positive for Flu on 02/13/22 and was given fluids due to dehydration and was sent to ER and sent home and was told to follow up with PCP.  Patient says she is still having stomach ache and feels a little feverish.    Abdominal Pain   Fever   Nausea   UPPER RESPIRATORY TRACT INFECTION Duration: 9 days Worst symptom: nausea and vomiting, nagging discomfort in her stomach Fever: no Cough: yes Shortness of breath: no Wheezing: no Chest pain: no Chest tightness: no Chest congestion: no Nasal congestion: no Runny nose: no Post nasal drip: no Sneezing: no Sore throat: no Swollen glands: no Sinus pressure: no Headache: no Face pain: no Toothache: no Ear pain: no  Ear pressure: no  Eyes red/itching:no Eye drainage/crusting: no  Vomiting: no Rash: no Fatigue: yes Sick contacts: no Strep contacts: no  Context: better Recurrent sinusitis: no Relief with OTC cold/cough medications: no  Treatments attempted: fluids    Relevant past medical, surgical, family and social history reviewed and updated as indicated. Interim medical history since our last visit reviewed. Allergies and medications reviewed and updated.  Review of Systems  Constitutional: Negative.   Respiratory:  Positive for cough. Negative for apnea, choking, chest tightness, shortness of breath, wheezing and stridor.   Cardiovascular: Negative.   Gastrointestinal:  Positive for abdominal pain. Negative for abdominal distention, anal bleeding, blood in stool, constipation, diarrhea, nausea, rectal pain and vomiting.  Musculoskeletal: Negative.   Skin: Negative.   Psychiatric/Behavioral: Negative.      Per HPI unless specifically indicated above      Objective:    LMP  (LMP Unknown) Comment: age 71  Wt Readings from Last 3 Encounters:  02/07/22 164 lb 12.8 oz (74.8 kg)  11/01/21 174 lb 8 oz (79.2 kg)  06/19/21 173 lb 3.2 oz (78.6 kg)    Physical Exam Vitals and nursing note reviewed.  Constitutional:      General: She is not in acute distress.    Appearance: Normal appearance. She is well-developed. She is not ill-appearing, toxic-appearing or diaphoretic.  HENT:     Head: Normocephalic and atraumatic.     Right Ear: External ear normal.     Left Ear: External ear normal.     Nose: Nose normal.     Mouth/Throat:     Mouth: Mucous membranes are moist.     Pharynx: Oropharynx is clear.  Eyes:     General: No scleral icterus.       Right eye: No discharge.        Left eye: No discharge.     Conjunctiva/sclera: Conjunctivae normal.     Pupils: Pupils are equal, round, and reactive to light.  Pulmonary:     Effort: Pulmonary effort is normal. No respiratory distress.     Comments: Speaking in full sentences Musculoskeletal:        General: Normal range of motion.     Cervical back: Normal range of motion.  Skin:    Coloration: Skin is not jaundiced or pale.     Findings: No bruising, erythema, lesion or rash.  Neurological:     Mental Status: She is alert  and oriented to person, place, and time. Mental status is at baseline.  Psychiatric:        Mood and Affect: Mood normal.        Behavior: Behavior normal.        Thought Content: Thought content normal.        Judgment: Judgment normal.     Results for orders placed or performed during the hospital encounter of 02/13/22  Lipase, blood  Result Value Ref Range   Lipase 54 (H) 11 - 51 U/L  Comprehensive metabolic panel  Result Value Ref Range   Sodium 133 (L) 135 - 145 mmol/L   Potassium 2.8 (L) 3.5 - 5.1 mmol/L   Chloride 91 (L) 98 - 111 mmol/L   CO2 30 22 - 32 mmol/L   Glucose, Bld 132 (H) 70 - 99 mg/dL   BUN 12 8 - 23 mg/dL   Creatinine, Ser 0.57 0.44 -  1.00 mg/dL   Calcium 8.9 8.9 - 10.3 mg/dL   Total Protein 7.5 6.5 - 8.1 g/dL   Albumin 4.3 3.5 - 5.0 g/dL   AST 36 15 - 41 U/L   ALT 30 0 - 44 U/L   Alkaline Phosphatase 33 (L) 38 - 126 U/L   Total Bilirubin 1.2 0.3 - 1.2 mg/dL   GFR, Estimated >60 >60 mL/min   Anion gap 12 5 - 15  CBC  Result Value Ref Range   WBC 4.4 4.0 - 10.5 K/uL   RBC 4.55 3.87 - 5.11 MIL/uL   Hemoglobin 13.4 12.0 - 15.0 g/dL   HCT 40.5 36.0 - 46.0 %   MCV 89.0 80.0 - 100.0 fL   MCH 29.5 26.0 - 34.0 pg   MCHC 33.1 30.0 - 36.0 g/dL   RDW 12.1 11.5 - 15.5 %   Platelets 299 150 - 400 K/uL   nRBC 0.0 0.0 - 0.2 %  Urinalysis, Routine w reflex microscopic  Result Value Ref Range   Color, Urine AMBER (A) YELLOW   APPearance HAZY (A) CLEAR   Specific Gravity, Urine 1.020 1.005 - 1.030   pH 5.0 5.0 - 8.0   Glucose, UA NEGATIVE NEGATIVE mg/dL   Hgb urine dipstick NEGATIVE NEGATIVE   Bilirubin Urine NEGATIVE NEGATIVE   Ketones, ur NEGATIVE NEGATIVE mg/dL   Protein, ur 30 (A) NEGATIVE mg/dL   Nitrite NEGATIVE NEGATIVE   Leukocytes,Ua NEGATIVE NEGATIVE   RBC / HPF 0-5 0 - 5 RBC/hpf   WBC, UA 6-10 0 - 5 WBC/hpf   Bacteria, UA RARE (A) NONE SEEN   Squamous Epithelial / LPF 0-5 0 - 5 /HPF   Mucus PRESENT    Hyaline Casts, UA PRESENT   Magnesium  Result Value Ref Range   Magnesium 1.9 1.7 - 2.4 mg/dL      Assessment & Plan:   Problem List Items Addressed This Visit   None Visit Diagnoses     Influenza A    -  Primary   Improving. Will start the zofran she has at home and start tessalon. Call if getting worse. Follow up with PCP as scheduled 02/25/22.   Relevant Medications   PREVNAR 20 0.5 ML injection   FLUAD QUADRIVALENT 0.5 ML injection        Follow up plan: Return as scheduled.   This visit was completed via video visit through MyChart due to the restrictions of the COVID-19 pandemic. All issues as above were discussed and addressed. Physical exam was done as above through visual confirmation  on video through [place]. If it was felt that the patient should be evaluated in the office, they were directed there. The patient verbally consented to this visit. Location of the patient: home Location of the provider: work Those involved with this call:  Provider: Park Liter, DO CMA: Irena Reichmann, [place] Desk/Registration: [employer]  Time spent on call:  15 minutes with patient face to face via video conference. More than 50% of this time was spent in counseling and coordination of care. 23 minutes total spent in review of patient's record and preparation of their chart.

## 2022-02-18 DIAGNOSIS — Z7984 Long term (current) use of oral hypoglycemic drugs: Secondary | ICD-10-CM | POA: Diagnosis not present

## 2022-02-18 DIAGNOSIS — Z7982 Long term (current) use of aspirin: Secondary | ICD-10-CM | POA: Diagnosis not present

## 2022-02-18 DIAGNOSIS — R109 Unspecified abdominal pain: Secondary | ICD-10-CM | POA: Diagnosis not present

## 2022-02-18 DIAGNOSIS — Z602 Problems related to living alone: Secondary | ICD-10-CM | POA: Diagnosis not present

## 2022-02-18 DIAGNOSIS — I83812 Varicose veins of left lower extremities with pain: Secondary | ICD-10-CM | POA: Diagnosis not present

## 2022-02-18 DIAGNOSIS — R1031 Right lower quadrant pain: Secondary | ICD-10-CM | POA: Diagnosis not present

## 2022-02-18 DIAGNOSIS — E785 Hyperlipidemia, unspecified: Secondary | ICD-10-CM | POA: Diagnosis not present

## 2022-02-18 DIAGNOSIS — M1712 Unilateral primary osteoarthritis, left knee: Secondary | ICD-10-CM | POA: Diagnosis not present

## 2022-02-18 DIAGNOSIS — E871 Hypo-osmolality and hyponatremia: Secondary | ICD-10-CM | POA: Diagnosis not present

## 2022-02-18 DIAGNOSIS — R197 Diarrhea, unspecified: Secondary | ICD-10-CM | POA: Diagnosis not present

## 2022-02-18 DIAGNOSIS — E876 Hypokalemia: Secondary | ICD-10-CM | POA: Diagnosis not present

## 2022-02-18 DIAGNOSIS — K219 Gastro-esophageal reflux disease without esophagitis: Secondary | ICD-10-CM | POA: Diagnosis not present

## 2022-02-18 DIAGNOSIS — M19011 Primary osteoarthritis, right shoulder: Secondary | ICD-10-CM | POA: Diagnosis not present

## 2022-02-18 DIAGNOSIS — R531 Weakness: Secondary | ICD-10-CM | POA: Diagnosis not present

## 2022-02-18 DIAGNOSIS — R112 Nausea with vomiting, unspecified: Secondary | ICD-10-CM | POA: Diagnosis not present

## 2022-02-18 DIAGNOSIS — J219 Acute bronchiolitis, unspecified: Secondary | ICD-10-CM | POA: Diagnosis not present

## 2022-02-18 DIAGNOSIS — I10 Essential (primary) hypertension: Secondary | ICD-10-CM | POA: Diagnosis not present

## 2022-02-18 DIAGNOSIS — J309 Allergic rhinitis, unspecified: Secondary | ICD-10-CM | POA: Diagnosis not present

## 2022-02-18 DIAGNOSIS — R059 Cough, unspecified: Secondary | ICD-10-CM | POA: Diagnosis not present

## 2022-02-18 DIAGNOSIS — E860 Dehydration: Secondary | ICD-10-CM | POA: Diagnosis not present

## 2022-02-18 DIAGNOSIS — R110 Nausea: Secondary | ICD-10-CM | POA: Diagnosis not present

## 2022-02-18 DIAGNOSIS — E872 Acidosis, unspecified: Secondary | ICD-10-CM | POA: Diagnosis not present

## 2022-02-18 DIAGNOSIS — E1169 Type 2 diabetes mellitus with other specified complication: Secondary | ICD-10-CM | POA: Diagnosis not present

## 2022-02-18 DIAGNOSIS — I152 Hypertension secondary to endocrine disorders: Secondary | ICD-10-CM | POA: Diagnosis not present

## 2022-02-18 DIAGNOSIS — E878 Other disorders of electrolyte and fluid balance, not elsewhere classified: Secondary | ICD-10-CM | POA: Diagnosis not present

## 2022-02-19 DIAGNOSIS — R109 Unspecified abdominal pain: Secondary | ICD-10-CM | POA: Diagnosis not present

## 2022-02-19 DIAGNOSIS — E876 Hypokalemia: Secondary | ICD-10-CM | POA: Diagnosis not present

## 2022-02-19 DIAGNOSIS — R638 Other symptoms and signs concerning food and fluid intake: Secondary | ICD-10-CM | POA: Diagnosis not present

## 2022-02-19 DIAGNOSIS — E871 Hypo-osmolality and hyponatremia: Secondary | ICD-10-CM | POA: Diagnosis not present

## 2022-02-19 DIAGNOSIS — R531 Weakness: Secondary | ICD-10-CM | POA: Diagnosis not present

## 2022-02-20 DIAGNOSIS — R109 Unspecified abdominal pain: Secondary | ICD-10-CM | POA: Diagnosis not present

## 2022-02-20 DIAGNOSIS — R638 Other symptoms and signs concerning food and fluid intake: Secondary | ICD-10-CM | POA: Diagnosis not present

## 2022-02-20 DIAGNOSIS — R531 Weakness: Secondary | ICD-10-CM | POA: Diagnosis not present

## 2022-02-23 NOTE — Patient Instructions (Incomplete)
Please call to schedule your mammogram and/or bone density: [facility]  Address: [address], [place], [place] [address] Phone: [phone]  [facility] [place] [address] [place],  [place]  [address] Phone: [phone]    Diabetes Mellitus Basics  Diabetes mellitus, or diabetes, is a long-term (chronic) disease. It occurs when the body does not properly use sugar (glucose) that is released from food after you eat. Diabetes mellitus may be caused by one or both of these problems: Your pancreas does not make enough of a hormone called insulin. Your body does not react in a normal way to the insulin that it makes. Insulin lets glucose enter cells in your body. This gives you energy. If you have diabetes, glucose cannot get into cells. This causes high blood glucose (hyperglycemia). How to treat and manage diabetes You may need to take insulin or other diabetes medicines daily to keep your glucose in balance. If you are prescribed insulin, you will learn how to give yourself insulin by injection. You may need to adjust the amount of insulin you take based on the foods that you eat. You will need to check your blood glucose levels using a glucose monitor as told by your health care provider. The readings can help determine if you have low or high blood glucose. Generally, you should have these blood glucose levels: Before meals (preprandial): 80-130 mg/dL (4.4-7.2 mmol/L). After meals (postprandial): below 180 mg/dL (10 mmol/L). Hemoglobin A1c (HbA1c) level: less than 7%. Your health care provider will set treatment goals for you. Keep all follow-up visits. This is important. Follow these instructions at home: Diabetes medicines Take your diabetes medicines every day as told by your health care provider. List your diabetes medicines here: Name of medicine: ______________________________ Amount (dose):  _______________ Time (a.m./p.m.): _______________ Notes: ___________________________________ Name of medicine: ______________________________ Amount (dose): _______________ Time (a.m./p.m.): _______________ Notes: ___________________________________ Name of medicine: ______________________________ Amount (dose): _______________ Time (a.m./p.m.): _______________ Notes: ___________________________________ Insulin If you use insulin, list the types of insulin you use here: Insulin type: ______________________________ Amount (dose): _______________ Time (a.m./p.m.): _______________Notes: ___________________________________ Insulin type: ______________________________ Amount (dose): _______________ Time (a.m./p.m.): _______________ Notes: ___________________________________ Insulin type: ______________________________ Amount (dose): _______________ Time (a.m./p.m.): _______________ Notes: ___________________________________ Insulin type: ______________________________ Amount (dose): _______________ Time (a.m./p.m.): _______________ Notes: ___________________________________ Insulin type: ______________________________ Amount (dose): _______________ Time (a.m./p.m.): _______________ Notes: ___________________________________ Managing blood glucose  Check your blood glucose levels using a glucose monitor as told by your health care provider. Write down the times that you check your glucose levels here: Time: _______________ Notes: ___________________________________ Time: _______________ Notes: ___________________________________ Time: _______________ Notes: ___________________________________ Time: _______________ Notes: ___________________________________ Time: _______________ Notes: ___________________________________ Time: _______________ Notes: ___________________________________  Low blood glucose Low blood glucose (hypoglycemia) is when glucose is at or below 70 mg/dL (3.9 mmol/L).  Symptoms may include: Feeling: Hungry. Sweaty and clammy. Irritable or easily upset. Dizzy. Sleepy. Having: A fast heartbeat. A headache. A change in your vision. Numbness around the mouth, lips, or tongue. Having trouble with: Moving (coordination). Sleeping. Treating low blood glucose To treat low blood glucose, eat or drink something containing sugar right away. If you can think clearly and swallow safely, follow the 15:15 rule: Take 15 grams of a fast-acting carb (carbohydrate), as told by your health care provider. Some fast-acting carbs are: Glucose tablets: take 3-4 tablets. Hard candy: eat 3-5 pieces. Fruit juice: drink 4 oz (120 mL). Regular (not diet) soda: drink 4-6 oz (120-180 mL).   Honey or sugar: eat 1 Tbsp (15 mL). Check your blood glucose levels 15 minutes after you take the carb. If your glucose is still at or below 70 mg/dL (3.9 mmol/L), take 15 grams of a carb again. If your glucose does not go above 70 mg/dL (3.9 mmol/L) after 3 tries, get help right away. After your glucose goes back to normal, eat a meal or a snack within 1 hour. Treating very low blood glucose If your glucose is at or below 54 mg/dL (3 mmol/L), you have very low blood glucose (severe hypoglycemia). This is an emergency. Do not wait to see if the symptoms will go away. Get medical help right away. Call your local emergency services (911 in the [place] Do not drive yourself to the hospital. Questions to ask your health care provider Should I talk with a diabetes educator? What equipment will I need to care for myself at home? What diabetes medicines do I need? When should I take them? How often do I need to check my blood glucose levels? What number can I call if I have questions? When is my follow-up visit? Where can I find a support group for people with diabetes? Where to find more information American Diabetes Association: www.diabetes.org Association of Diabetes Care and Education  Specialists: www.diabeteseducator.org Contact a health care provider if: Your blood glucose is at or above 240 mg/dL (13.3 mmol/L) for 2 days in a row. You have been sick or have had a fever for 2 days or more, and you are not getting better. You have any of these problems for more than 6 hours: You cannot eat or drink. You feel nauseous. You vomit. You have diarrhea. Get help right away if: Your blood glucose is lower than 54 mg/dL (3 mmol/L). You get confused. You have trouble thinking clearly. You have trouble breathing. These symptoms may represent a serious problem that is an emergency. Do not wait to see if the symptoms will go away. Get medical help right away. Call your local emergency services (911 in the [place] Do not drive yourself to the hospital. Summary Diabetes mellitus is a chronic disease that occurs when the body does not properly use sugar (glucose) that is released from food after you eat. Take insulin and diabetes medicines as told. Check your blood glucose every day, as often as told. Keep all follow-up visits. This is important. This information is not intended to replace advice given to you by your health care provider. Make sure you discuss any questions you have with your health care provider. Document Revised: 06/08/2019 Document Reviewed: 06/08/2019 Elsevier Patient Education  2023 Elsevier Inc.  

## 2022-02-25 ENCOUNTER — Ambulatory Visit (INDEPENDENT_AMBULATORY_CARE_PROVIDER_SITE_OTHER): Payer: 59 | Admitting: Nurse Practitioner

## 2022-02-25 ENCOUNTER — Encounter: Payer: Self-pay | Admitting: Nurse Practitioner

## 2022-02-25 VITALS — BP 125/80 | HR 77 | Temp 98.0°F | Ht 65.0 in | Wt 158.5 lb

## 2022-02-25 DIAGNOSIS — E1169 Type 2 diabetes mellitus with other specified complication: Secondary | ICD-10-CM | POA: Diagnosis not present

## 2022-02-25 DIAGNOSIS — I152 Hypertension secondary to endocrine disorders: Secondary | ICD-10-CM

## 2022-02-25 DIAGNOSIS — J101 Influenza due to other identified influenza virus with other respiratory manifestations: Secondary | ICD-10-CM | POA: Insufficient documentation

## 2022-02-25 DIAGNOSIS — K219 Gastro-esophageal reflux disease without esophagitis: Secondary | ICD-10-CM

## 2022-02-25 DIAGNOSIS — Z23 Encounter for immunization: Secondary | ICD-10-CM

## 2022-02-25 DIAGNOSIS — E785 Hyperlipidemia, unspecified: Secondary | ICD-10-CM | POA: Diagnosis not present

## 2022-02-25 DIAGNOSIS — E538 Deficiency of other specified B group vitamins: Secondary | ICD-10-CM | POA: Diagnosis not present

## 2022-02-25 DIAGNOSIS — E1165 Type 2 diabetes mellitus with hyperglycemia: Secondary | ICD-10-CM | POA: Diagnosis not present

## 2022-02-25 DIAGNOSIS — E1159 Type 2 diabetes mellitus with other circulatory complications: Secondary | ICD-10-CM

## 2022-02-25 LAB — MICROALBUMIN, URINE WAIVED
Creatinine, Urine Waived: 100 mg/dL (ref 10–300)
Microalb, Ur Waived: 30 mg/L — ABNORMAL HIGH (ref 0–19)
Microalb/Creat Ratio: <30 mg/g (ref <30)

## 2022-02-25 LAB — BAYER DCA HB A1C WAIVED: HB A1C (BAYER DCA - WAIVED): 7.1 % — ABNORMAL HIGH (ref 4.8–5.6)

## 2022-02-25 MED ORDER — RYBELSUS 7 MG PO TABS: 7.0000 mg | ORAL_TABLET | Freq: Every day | ORAL | 12 refills | Status: DC

## 2022-02-25 MED ORDER — METFORMIN HCL 1000 MG PO TABS: ORAL_TABLET | ORAL | 4 refills | Status: DC

## 2022-02-25 MED ORDER — AZITHROMYCIN 250 MG PO TABS: ORAL_TABLET | ORAL | 0 refills | Status: AC

## 2022-02-25 MED ORDER — MECLIZINE HCL 12.5 MG PO TABS: 12.5000 mg | ORAL_TABLET | Freq: Three times a day (TID) | ORAL | 0 refills | Status: AC | PRN

## 2022-02-25 NOTE — Assessment & Plan Note (Signed)
Chronic, ongoing.  A1c today 7.1% slight trend down. Urine ALB 19 March 2022 - on ACE. Continue Lotensin for kidney protection,  Metformin 1000 MG BID and Rybelsus 7 MG daily -- would like to avoid insulin, could consider SGLT2 in future and education on this. Continue diet changes at home. Recommend to monitor BS daily at home and document daily with fasting BS goal of <130.  Monitor diet closely with focus on diabetic diet and regular exercise.

## 2022-02-25 NOTE — Progress Notes (Signed)
BP 125/80   Pulse 77   Temp 98 F (36.7 C) (Oral)   Ht '5\' 5"'$  (1.651 m)   Wt 158 lb 8 oz (71.9 kg)   LMP  (LMP Unknown) Comment: age 72  SpO2 93%   BMI 26.38 kg/m    Subjective:    Patient ID: Cheryl Mendez, female    DOB: 01-10-1951, 72 y.o.   MRN: [id number]  HPI: Cheryl Mendez is a 72 y.o. female  Chief Complaint  Patient presents with   Hospitalization Follow-up    Was in [facility] for 3 days because of having the flu and dehydrated.  States that since she had the flu and was d/c'd from hospital she has been swimmy headed and her stomach has been hurting from vomiting.   Diabetes   ER FOLLOW UP Was seen in ER [facility] [place] initially on 02/13/22 and diagnosed with Influenza A.  Returned to ER [facility]) on 02/18/22 for dehydration due to nausea and vomiting.  At [facility] her CT abdomen/pelvis was normal + Lipase normal.  They suspected muscular myalgias due to flu.  Was given IV fluids.  At discharge K+ 3.6, eGFR >90, Mag 1.8, CBC reassuring.  Continues to have dizziness and fatigue post treatment.  She reports still not 100%. Is drinking lots of water at home.  Is slowly improving, continues to cough some with occasional wheezing. Time since discharge:  Hospital/facility: [facility] and [facility] Diagnosis: Influenza A and dehydration + hypokalemia and hypomagnesemia Procedures/tests: CT and labs Consultants: none New medications: none Discharge instructions:  Follow-up with PCP Status: stable  DIABETES A1c 7.4% September 2023.  Currently taking Rybelsus 7 MG (can not tolerate higher doses due to elevation in pancreas levels with this) and Metformin 1000 MG BID.  Has lost 16 pounds with Rybelsus.  Continues to take B12 daily for deficiency.  In past took Trulicity with GI issues and Invokana stopped due to foot concerns + Jardiance made her sick. Hypoglycemic episodes:no Polydipsia/polyuria: no Visual disturbance: no Chest pain: no Paresthesias: no Glucose Monitoring: no  Accucheck  frequency: Not Checking   Fasting glucose:  Post prandial:  Evening:  Before meals: Taking Insulin?: no  Long acting insulin:  Short acting insulin: Blood Pressure Monitoring: not checking Retinal Examination: Up to Date - Dr. Gloriann Loan in March Foot Exam: Up to Date Pneumovax: Up to Date Influenza: Up to Date Aspirin: no   HYPERTENSION / HYPERLIPIDEMIA Currently taking Lotensin 20-25 and Atorvastatin 10 MG. History of mild elevations periodically on LFTs, denies alcohol use. Satisfied with current treatment? yes Duration of hypertension: chronic BP monitoring frequency: not checking BP range:  BP medication side effects: no Duration of hyperlipidemia: chronic Cholesterol medication side effects: no Cholesterol supplements: none Medication compliance: good compliance Aspirin: no Recent stressors: no Recurrent headaches: no Visual changes: no Palpitations: no Dyspnea: no Chest pain: no Lower extremity edema: no Dizzy/lightheaded: no The 10-year ASCVD risk score (Arnett DK, et al., 2019) is: 24.2%   Values used to calculate the score:     Age: 56 years     Sex: Female     Is Non-Hispanic African American: No     Diabetic: Yes     Tobacco smoker: No     Systolic Blood Pressure: 673 mmHg     Is BP treated: Yes     HDL Cholesterol: 42 mg/dL     Total Cholesterol: 169 mg/dL  GERD Continues on Omeprazole 40 MG PRN. GERD control status: stable Satisfied with current  treatment? yes Heartburn frequency: occasional Medication side effects: no  Medication compliance: stable Dysphagia: no Odynophagia:  no Hematemesis: no Blood in stool: no EGD: yes    Relevant past medical, surgical, family and social history reviewed and updated as indicated. Interim medical history since our last visit reviewed. Allergies and medications reviewed and updated.  Review of Systems  Constitutional:  Negative for activity change, appetite change, diaphoresis, fatigue and fever.  HENT:  Negative.    Respiratory:  Positive for cough and wheezing. Negative for chest tightness and shortness of breath.   Cardiovascular:  Negative for chest pain, palpitations and leg swelling.  Gastrointestinal: Negative.   Endocrine: Negative for cold intolerance, heat intolerance, polydipsia, polyphagia and polyuria.  Neurological:  Positive for dizziness (mild after flu). Negative for syncope, weakness, numbness and headaches.  Psychiatric/Behavioral: Negative.     Per HPI unless specifically indicated above     Objective:    BP 125/80   Pulse 77   Temp 98 F (36.7 C) (Oral)   Ht '5\' 5"'$  (1.651 m)   Wt 158 lb 8 oz (71.9 kg)   LMP  (LMP Unknown) Comment: age 55  SpO2 93%   BMI 26.38 kg/m   Wt Readings from Last 3 Encounters:  02/25/22 158 lb 8 oz (71.9 kg)  02/07/22 164 lb 12.8 oz (74.8 kg)  11/01/21 174 lb 8 oz (79.2 kg)    Physical Exam Vitals and nursing note reviewed.  Constitutional:      General: She is awake. She is not in acute distress.    Appearance: She is well-developed, well-groomed and overweight. She is not ill-appearing or toxic-appearing.  HENT:     Head: Normocephalic.     Right Ear: Hearing, ear canal and external ear normal. A middle ear effusion is present.     Left Ear: Hearing, ear canal and external ear normal. A middle ear effusion is present.     Nose: Rhinorrhea present. Rhinorrhea is clear.     Right Sinus: No maxillary sinus tenderness or frontal sinus tenderness.     Left Sinus: No maxillary sinus tenderness or frontal sinus tenderness.     Mouth/Throat:     Mouth: Mucous membranes are moist.     Pharynx: No pharyngeal swelling, oropharyngeal exudate or posterior oropharyngeal erythema.  Eyes:     General: Lids are normal.        Right eye: No discharge.        Left eye: No discharge.     Conjunctiva/sclera: Conjunctivae normal.     Pupils: Pupils are equal, round, and reactive to light.  Neck:     Thyroid: No thyromegaly.     Vascular: No  carotid bruit or JVD.  Cardiovascular:     Rate and Rhythm: Normal rate and regular rhythm.     Heart sounds: Normal heart sounds. No murmur heard.    No gallop.  Pulmonary:     Effort: Pulmonary effort is normal. No accessory muscle usage or respiratory distress.     Breath sounds: Normal breath sounds.  Abdominal:     General: Bowel sounds are normal. There is no distension.     Palpations: Abdomen is soft.  Musculoskeletal:     Cervical back: Normal range of motion and neck supple.     Right lower leg: No edema.     Left lower leg: No edema.  Lymphadenopathy:     Cervical: No cervical adenopathy.  Skin:    General: Skin is warm and  dry.  Neurological:     Mental Status: She is alert and oriented to person, place, and time.     Motor: Motor function is intact.     Coordination: Coordination is intact.     Gait: Gait is intact.     Deep Tendon Reflexes: Reflexes are normal and symmetric.     Reflex Scores:      Brachioradialis reflexes are 2+ on the right side and 2+ on the left side.      Patellar reflexes are 2+ on the right side and 2+ on the left side. Psychiatric:        Attention and Perception: Attention normal.        Mood and Affect: Mood normal.        Behavior: Behavior normal. Behavior is cooperative.        Thought Content: Thought content normal.        Judgment: Judgment normal.    Results for orders placed or performed during the hospital encounter of 02/13/22  Lipase, blood  Result Value Ref Range   Lipase 54 (H) 11 - 51 U/L  Comprehensive metabolic panel  Result Value Ref Range   Sodium 133 (L) 135 - 145 mmol/L   Potassium 2.8 (L) 3.5 - 5.1 mmol/L   Chloride 91 (L) 98 - 111 mmol/L   CO2 30 22 - 32 mmol/L   Glucose, Bld 132 (H) 70 - 99 mg/dL   BUN 12 8 - 23 mg/dL   Creatinine, Ser 0.57 0.44 - 1.00 mg/dL   Calcium 8.9 8.9 - 10.3 mg/dL   Total Protein 7.5 6.5 - 8.1 g/dL   Albumin 4.3 3.5 - 5.0 g/dL   AST 36 15 - 41 U/L   ALT 30 0 - 44 U/L    Alkaline Phosphatase 33 (L) 38 - 126 U/L   Total Bilirubin 1.2 0.3 - 1.2 mg/dL   GFR, Estimated >60 >60 mL/min   Anion gap 12 5 - 15  CBC  Result Value Ref Range   WBC 4.4 4.0 - 10.5 K/uL   RBC 4.55 3.87 - 5.11 MIL/uL   Hemoglobin 13.4 12.0 - 15.0 g/dL   HCT 40.5 36.0 - 46.0 %   MCV 89.0 80.0 - 100.0 fL   MCH 29.5 26.0 - 34.0 pg   MCHC 33.1 30.0 - 36.0 g/dL   RDW 12.1 11.5 - 15.5 %   Platelets 299 150 - 400 K/uL   nRBC 0.0 0.0 - 0.2 %  Urinalysis, Routine w reflex microscopic  Result Value Ref Range   Color, Urine AMBER (A) YELLOW   APPearance HAZY (A) CLEAR   Specific Gravity, Urine 1.020 1.005 - 1.030   pH 5.0 5.0 - 8.0   Glucose, UA NEGATIVE NEGATIVE mg/dL   Hgb urine dipstick NEGATIVE NEGATIVE   Bilirubin Urine NEGATIVE NEGATIVE   Ketones, ur NEGATIVE NEGATIVE mg/dL   Protein, ur 30 (A) NEGATIVE mg/dL   Nitrite NEGATIVE NEGATIVE   Leukocytes,Ua NEGATIVE NEGATIVE   RBC / HPF 0-5 0 - 5 RBC/hpf   WBC, UA 6-10 0 - 5 WBC/hpf   Bacteria, UA RARE (A) NONE SEEN   Squamous Epithelial / HPF 0-5 0 - 5 /HPF   Mucus PRESENT    Hyaline Casts, UA PRESENT   Magnesium  Result Value Ref Range   Magnesium 1.9 1.7 - 2.4 mg/dL      Assessment & Plan:   Problem List Items Addressed This Visit       Cardiovascular and  Mediastinum   Hypertension associated with diabetes ([place])    Chronic, stable with BP at goal on check today.  Continue current medication regimen and adjust as needed -- Lotensin offering kidney protection.  Recommend checking BP at home three mornings a week and documenting for provider review.  LABS: CBC, TSH, CMP, urine ALB today.  Return in 3 months.      Relevant Medications   Semaglutide (RYBELSUS) 7 MG TABS   metFORMIN (GLUCOPHAGE) 1000 MG tablet   Other Relevant Orders   Bayer DCA Hb A1c Waived   CBC with Differential/Platelet   Comprehensive metabolic panel   TSH   Microalbumin, Urine Waived     Respiratory   Influenza A    Slowly improving.  Will  send in Meclizine to take as needed for lingering dizziness, recommend not to drive with this and only take if needed.  Ensure good hydration at home.  Lab check today.  Send in Azithromycin for ongoing cough over one week, she wishes to hold off on CXR.      Relevant Medications   azithromycin (ZITHROMAX) 250 MG tablet     Digestive   GERD (gastroesophageal reflux disease)    Chronic, ongoing.  Continue Prilosec 40 MG daily PRN at this time as is offering benefit.  Risks of PPI use were discussed with patient including bone loss, C. Diff diarrhea, pneumonia, infections, CKD, electrolyte abnormalities.  Pt. Verbalizes understanding and chooses to continue the medication. Mag level today.       Relevant Medications   meclizine (ANTIVERT) 12.5 MG tablet   Other Relevant Orders   Magnesium   Amylase   Lipase     Endocrine   Hyperlipidemia associated with type 2 diabetes mellitus (HCC)    Chronic, ongoing.  Continue current medication regimen and adjust as needed.  Lipid panel today.      Relevant Medications   Semaglutide (RYBELSUS) 7 MG TABS   metFORMIN (GLUCOPHAGE) 1000 MG tablet   Other Relevant Orders   Bayer DCA Hb A1c Waived   Comprehensive metabolic panel   Lipid Panel w/o Chol/HDL Ratio   Microalbumin, Urine Waived   Poorly controlled type 2 diabetes mellitus (HCC) - Primary    Chronic, ongoing.  A1c today 7.1% slight trend down. Urine ALB 19 March 2022 - on ACE. Continue Lotensin for kidney protection,  Metformin 1000 MG BID and Rybelsus 7 MG daily -- would like to avoid insulin, could consider SGLT2 in future and education on this. Continue diet changes at home. Recommend to monitor BS daily at home and document daily with fasting BS goal of <130.  Monitor diet closely with focus on diabetic diet and regular exercise.      Relevant Medications   Semaglutide (RYBELSUS) 7 MG TABS   metFORMIN (GLUCOPHAGE) 1000 MG tablet   Other Relevant Orders   Bayer DCA Hb A1c Waived    Microalbumin, Urine Waived     Other   B12 deficiency    Noted low normal levels on labs in past.  Recommend she continue taking Vitamin B12 500 to 1000 MCG daily.  Recheck level today.  Explained to patient the benefit of annual B12 level testing while on daily Metformin.  Educated patient on the effect of Metformin on absorption of Vitamin B12 and symptoms of low B12 level, such as neuropathic pain.  "Reduced absorption of vitamin B12 is a known adverse effect of long-term use of Metformin" and "routine B12 monitoring may be considered in patients with  poor dietary intake or absorption" Pollie Meyer, D. J., 2019 via UpToDate).         Relevant Orders   Vitamin B12     Follow up plan: Return in about 2 weeks (around 03/11/2022) for INFLUENZA.

## 2022-02-25 NOTE — Assessment & Plan Note (Signed)
Chronic, ongoing.  Continue current medication regimen and adjust as needed. Lipid panel today. 

## 2022-02-25 NOTE — Assessment & Plan Note (Signed)
Chronic, ongoing.  Continue Prilosec 40 MG daily PRN at this time as is offering benefit.  Risks of PPI use were discussed with patient including bone loss, C. Diff diarrhea, pneumonia, infections, CKD, electrolyte abnormalities.  Pt. Verbalizes understanding and chooses to continue the medication. Mag level today.

## 2022-02-25 NOTE — Assessment & Plan Note (Signed)
Chronic, stable with BP at goal on check today.  Continue current medication regimen and adjust as needed -- Lotensin offering kidney protection.  Recommend checking BP at home three mornings a week and documenting for provider review.  LABS: CBC, TSH, CMP, urine ALB today.  Return in 3 months.

## 2022-02-25 NOTE — Assessment & Plan Note (Signed)
Slowly improving.  Will send in Meclizine to take as needed for lingering dizziness, recommend not to drive with this and only take if needed.  Ensure good hydration at home.  Lab check today.  Send in Azithromycin for ongoing cough over one week, she wishes to hold off on CXR.

## 2022-02-25 NOTE — Assessment & Plan Note (Signed)
Noted low normal levels on labs in past.  Recommend she continue taking Vitamin B12 500 to 1000 MCG daily.  Recheck level today.  Explained to patient the benefit of annual B12 level testing while on daily Metformin.  Educated patient on the effect of Metformin on absorption of Vitamin B12 and symptoms of low B12 level, such as neuropathic pain.  "Reduced absorption of vitamin B12 is a known adverse effect of long-term use of Metformin" and "routine B12 monitoring may be considered in patients with poor dietary intake or absorption" Pollie Meyer, D. J., 2019 via UpToDate).

## 2022-02-26 ENCOUNTER — Encounter: Payer: Self-pay | Admitting: Nurse Practitioner

## 2022-02-26 LAB — LIPID PANEL W/O CHOL/HDL RATIO
Cholesterol, Total: 171 mg/dL (ref 100–199)
HDL: 42 mg/dL (ref >39)
LDL Chol Calc (NIH): 89 mg/dL (ref 0–99)
Triglycerides: 239 mg/dL — ABNORMAL HIGH (ref 0–149)
VLDL Cholesterol Cal: 40 mg/dL (ref 5–40)

## 2022-02-26 LAB — COMPREHENSIVE METABOLIC PANEL
ALT: 18 IU/L (ref 0–32)
AST: 17 IU/L (ref 0–40)
Albumin/Globulin Ratio: 1.8 (ref 1.2–2.2)
Albumin: 4.4 g/dL (ref 3.8–4.8)
Alkaline Phosphatase: 42 IU/L — ABNORMAL LOW (ref 44–121)
BUN/Creatinine Ratio: 21 (ref 12–28)
BUN: 13 mg/dL (ref 8–27)
Bilirubin Total: 0.3 mg/dL (ref 0.0–1.2)
CO2: 23 mmol/L (ref 20–29)
Calcium: 10.2 mg/dL (ref 8.7–10.3)
Chloride: 95 mmol/L — ABNORMAL LOW (ref 96–106)
Creatinine, Ser: 0.63 mg/dL (ref 0.57–1.00)
Globulin, Total: 2.5 g/dL (ref 1.5–4.5)
Glucose: 135 mg/dL — ABNORMAL HIGH (ref 70–99)
Potassium: 4.5 mmol/L (ref 3.5–5.2)
Sodium: 137 mmol/L (ref 134–144)
Total Protein: 6.9 g/dL (ref 6.0–8.5)
eGFR: 95 mL/min/{1.73_m2} (ref >59)

## 2022-02-26 LAB — CBC WITH DIFFERENTIAL/PLATELET
Basophils Absolute: 0.1 10*3/uL (ref 0.0–0.2)
Basos: 1 %
EOS (ABSOLUTE): 0.2 10*3/uL (ref 0.0–0.4)
Eos: 3 %
Hematocrit: 37.3 % (ref 34.0–46.6)
Hemoglobin: 12.6 g/dL (ref 11.1–15.9)
Immature Grans (Abs): 0 10*3/uL (ref 0.0–0.1)
Immature Granulocytes: 1 %
Lymphocytes Absolute: 2.2 10*3/uL (ref 0.7–3.1)
Lymphs: 38 %
MCH: 29.8 pg (ref 26.6–33.0)
MCHC: 33.8 g/dL (ref 31.5–35.7)
MCV: 88 fL (ref 79–97)
Monocytes Absolute: 0.4 10*3/uL (ref 0.1–0.9)
Monocytes: 7 %
Neutrophils Absolute: 3 10*3/uL (ref 1.4–7.0)
Neutrophils: 50 %
Platelets: 491 10*3/uL — ABNORMAL HIGH (ref 150–450)
RBC: 4.23 x10E6/uL (ref 3.77–5.28)
RDW: 12.2 % (ref 11.7–15.4)
WBC: 5.8 10*3/uL (ref 3.4–10.8)

## 2022-02-26 LAB — VITAMIN B12: Vitamin B-12: 1074 pg/mL (ref 232–1245)

## 2022-02-26 LAB — MAGNESIUM: Magnesium: 1.6 mg/dL (ref 1.6–2.3)

## 2022-02-26 LAB — TSH: TSH: 2.35 u[IU]/mL (ref 0.450–4.500)

## 2022-02-26 LAB — LIPASE: Lipase: 62 U/L (ref 14–85)

## 2022-02-26 LAB — AMYLASE: Amylase: 55 U/L (ref 31–110)

## 2022-02-26 NOTE — Progress Notes (Signed)
Please send letter -- printed to front printer.:)

## 2022-03-01 ENCOUNTER — Other Ambulatory Visit: Payer: Self-pay | Admitting: Nurse Practitioner

## 2022-03-01 NOTE — Telephone Encounter (Signed)
Requested Prescriptions  Pending Prescriptions Disp Refills   atorvastatin (LIPITOR) 10 MG tablet [Pharmacy Med Name: ATORVASTATIN '10MG'$  TABLETS] 90 tablet     Sig: TAKE 1 TABLET(10 MG) BY MOUTH DAILY     Cardiovascular:  Antilipid - Statins Failed - 03/01/2022  7:07 AM      Failed - Lipid Panel in normal range within the last 12 months    Cholesterol, Total  Date Value Ref Range Status  02/25/2022 171 100 - 199 mg/dL Final   Cholesterol Piccolo, Waived  Date Value Ref Range Status  07/28/2018 159 <200 mg/dL Final    Comment:                            Desirable                <200                         Borderline High      200- 239                         High                     >239    LDL Chol Calc (NIH)  Date Value Ref Range Status  02/25/2022 89 0 - 99 mg/dL Final   HDL  Date Value Ref Range Status  02/25/2022 42 >39 mg/dL Final   Triglycerides  Date Value Ref Range Status  02/25/2022 239 (H) 0 - 149 mg/dL Final   Triglycerides Piccolo,Waived  Date Value Ref Range Status  07/28/2018 308 (H) <150 mg/dL Final    Comment:                            Normal                   <150                         Borderline High     150 - 199                         High                200 - 499                         Very High                >499          Passed - Patient is not pregnant      Passed - Valid encounter within last 12 months    Recent Outpatient Visits           4 days ago Poorly controlled type 2 diabetes mellitus ([place])   [place], Sheridan T, NP   2 weeks ago Influenza A   [facility] [place], Megan P, DO   3 weeks ago Superficial injury of skin   [facility] Mecum, Erin E, PA-C   4 months ago Poorly controlled type 2 diabetes mellitus ([place])   [place], Jolene T, NP   8 months ago RUQ abdominal pain   [place]  Venita Lick, NP       Future Appointments              In 1 week Cannady, Barbaraann Faster, NP [facility]             loratadine (CLARITIN) 10 MG tablet [employer] Med Name: LORATADINE '10MG'$  TABLETS] 90 tablet 1    Sig: TAKE 1 TABLET(10 MG) BY MOUTH DAILY     Ear, Nose, and Throat:  Antihistamines 2 Passed - 03/01/2022  7:07 AM      Passed - Cr in normal range and within 360 days    Creatinine  Date Value Ref Range Status  06/09/2014 0.73 mg/dL Final    Comment:    0.44-1.00 NOTE: New Reference Range  04/26/14    Creatinine, Ser  Date Value Ref Range Status  02/25/2022 0.63 0.57 - 1.00 mg/dL Final         Passed - Valid encounter within last 12 months    Recent Outpatient Visits           4 days ago Poorly controlled type 2 diabetes mellitus ([place])   [place], Barbaraann Faster, NP   2 weeks ago Influenza A   [employer], Megan P, DO   3 weeks ago Superficial injury of skin   [facility] Mecum, Erin E, PA-C   4 months ago Poorly controlled type 2 diabetes mellitus ([place])   [place] Cannady, Jolene T, NP   8 months ago RUQ abdominal pain   [place], Barbaraann Faster, NP       Future Appointments             In 1 week Cannady, Barbaraann Faster, NP [facility]

## 2022-03-07 NOTE — Progress Notes (Signed)
Eye exam is being faxed over by Dr. Purvis [place] office

## 2022-03-09 NOTE — Patient Instructions (Signed)

## 2022-03-11 ENCOUNTER — Ambulatory Visit
Admission: RE | Admit: 2022-03-11 | Discharge: 2022-03-11 | Disposition: A | Discharge status: Home / Self Care | Payer: 59 | Source: Ambulatory Visit | Attending: Nurse Practitioner | Admitting: Nurse Practitioner

## 2022-03-11 ENCOUNTER — Ambulatory Visit (INDEPENDENT_AMBULATORY_CARE_PROVIDER_SITE_OTHER): Payer: 59 | Admitting: Nurse Practitioner

## 2022-03-11 ENCOUNTER — Encounter: Payer: Self-pay | Admitting: Nurse Practitioner

## 2022-03-11 ENCOUNTER — Ambulatory Visit
Admission: RE | Admit: 2022-03-11 | Discharge: 2022-03-11 | Disposition: A | Discharge status: Home / Self Care | Payer: 59 | Attending: Nurse Practitioner | Admitting: Nurse Practitioner

## 2022-03-11 VITALS — BP 110/73 | HR 89 | Temp 97.7°F | Ht 65.0 in | Wt 160.3 lb

## 2022-03-11 DIAGNOSIS — R052 Subacute cough: Secondary | ICD-10-CM | POA: Insufficient documentation

## 2022-03-11 DIAGNOSIS — J101 Influenza due to other identified influenza virus with other respiratory manifestations: Secondary | ICD-10-CM

## 2022-03-11 DIAGNOSIS — R059 Cough, unspecified: Secondary | ICD-10-CM | POA: Diagnosis not present

## 2022-03-11 DIAGNOSIS — R0789 Other chest pain: Secondary | ICD-10-CM | POA: Diagnosis not present

## 2022-03-11 DIAGNOSIS — K4090 Unilateral inguinal hernia, without obstruction or gangrene, not specified as recurrent: Secondary | ICD-10-CM | POA: Insufficient documentation

## 2022-03-11 MED ORDER — ALBUTEROL SULFATE HFA 108 (90 BASE) MCG/ACT IN AERS: 2.0000 | INHALATION_SPRAY | Freq: Four times a day (QID) | RESPIRATORY_TRACT | 0 refills | Status: DC | PRN

## 2022-03-11 MED ORDER — GUAIFENESIN-CODEINE 100-10 MG/5ML PO SOLN: 5.0000 mL | Freq: Two times a day (BID) | ORAL | 0 refills | Status: DC | PRN

## 2022-03-11 MED ORDER — BENZONATATE 200 MG PO CAPS: 200.0000 mg | ORAL_CAPSULE | Freq: Two times a day (BID) | ORAL | 0 refills | Status: DC | PRN

## 2022-03-11 MED ORDER — AZITHROMYCIN 250 MG PO TABS: ORAL_TABLET | ORAL | 0 refills | Status: DC

## 2022-03-11 NOTE — Assessment & Plan Note (Signed)
Ongoing since Influenza A, will obtain imaging as higher risk for PNA.  Start Azithromycin, Tessalon, and cough syrup.  No Prednisone due to diabetes, with occasional poor control  Albuterol inhaler sent to use as needed.  Recommend: - Increased rest - Increasing Fluids - Acetaminophen as needed for fever/pain.  - Salt water gargling, chloraseptic spray and throat lozenges - Mucinex.  - Humidifying the air.  Return in one week for lung check.

## 2022-03-11 NOTE — Assessment & Plan Note (Signed)
Noted on imaging 02/18/22 at [facility]

## 2022-03-11 NOTE — Assessment & Plan Note (Signed)
Acute and lingering cough, not 100% better.  Refer to subacute cough plan of care.

## 2022-03-11 NOTE — Progress Notes (Signed)
BP 110/73   Pulse 89   Temp 97.7 F (36.5 C) (Oral)   Ht '5\' 5"'$  (1.651 m)   Wt 160 lb 4.8 oz (72.7 kg)   LMP  (LMP Unknown) Comment: age 72  SpO2 91%   BMI 26.68 kg/m    Subjective:    Patient ID: Cheryl Mendez, female    DOB: 19-Jun-1950, 72 y.o.   MRN: [id number]  HPI: Cheryl Mendez is a 72 y.o. female  Chief Complaint  Patient presents with   Influenza    Here for follow up. Still coughing.   INFLUENZA A Tested positive on 02/13/22 with Influenza A and continues to feel bad.  Did have flu and Prevnar vaccines.  She reports feeling 50% better, feels weak/nervous -- continues to cough.  They did imaging in ER at [facility] no PNA present, some left lower lobe bronchiolitis. Fever: no Cough: yes Shortness of breath: yes Wheezing: yes Chest pain: no Chest tightness: yes Chest congestion: yes Nasal congestion: yes Runny nose: yes Post nasal drip: yes Sneezing: no Sore throat: no Swollen glands: no Sinus pressure: no Headache: no Face pain: no Toothache: no Ear pain: none Ear pressure: none Eyes red/itching:no Eye drainage/crusting: no  Vomiting: no Rash: no Fatigue: yes Sick contacts: no Strep contacts: no  Context: fluctuating Recurrent sinusitis: no Relief with OTC cold/cough medications: yes  Treatments attempted: Cough medication    Relevant past medical, surgical, family and social history reviewed and updated as indicated. Interim medical history since our last visit reviewed. Allergies and medications reviewed and updated.  Review of Systems  Constitutional:  Positive for fatigue. Negative for activity change, appetite change, chills and fever.  HENT:  Positive for congestion, postnasal drip and rhinorrhea. Negative for ear discharge, ear pain, facial swelling, sinus pressure, sinus pain, sneezing, sore throat and voice change.   Eyes:  Negative for pain and visual disturbance.  Respiratory:  Positive for cough, chest tightness, shortness of breath and  wheezing.   Cardiovascular:  Negative for chest pain, palpitations and leg swelling.  Gastrointestinal: Negative.   Musculoskeletal:  Negative for myalgias.  Neurological:  Negative for dizziness, numbness and headaches.  Psychiatric/Behavioral: Negative.      Per HPI unless specifically indicated above     Objective:    BP 110/73   Pulse 89   Temp 97.7 F (36.5 C) (Oral)   Ht '5\' 5"'$  (1.651 m)   Wt 160 lb 4.8 oz (72.7 kg)   LMP  (LMP Unknown) Comment: age 51  SpO2 91%   BMI 26.68 kg/m   Wt Readings from Last 3 Encounters:  03/11/22 160 lb 4.8 oz (72.7 kg)  02/25/22 158 lb 8 oz (71.9 kg)  02/07/22 164 lb 12.8 oz (74.8 kg)    Physical Exam Vitals and nursing note reviewed.  Constitutional:      General: She is awake. She is not in acute distress.    Appearance: She is well-developed and well-groomed. She is not ill-appearing or toxic-appearing.  HENT:     Head: Normocephalic.     Right Ear: Hearing, ear canal and external ear normal. A middle ear effusion is present. Tympanic membrane is not injected or perforated.     Left Ear: Hearing, ear canal and external ear normal. A middle ear effusion is present. Tympanic membrane is not injected or perforated.     Nose: Rhinorrhea present. Rhinorrhea is clear.     Right Sinus: No maxillary sinus tenderness or frontal sinus tenderness.  Left Sinus: No maxillary sinus tenderness or frontal sinus tenderness.     Mouth/Throat:     Mouth: Mucous membranes are moist.     Pharynx: Posterior oropharyngeal erythema (mild) present. No pharyngeal swelling or oropharyngeal exudate.  Eyes:     General: Lids are normal.        Right eye: No discharge.        Left eye: No discharge.     Conjunctiva/sclera: Conjunctivae normal.     Pupils: Pupils are equal, round, and reactive to light.  Neck:     Thyroid: No thyromegaly.     Vascular: No carotid bruit.  Cardiovascular:     Rate and Rhythm: Normal rate and regular rhythm.     Heart  sounds: Normal heart sounds. No murmur heard.    No gallop.  Pulmonary:     Effort: Pulmonary effort is normal. No accessory muscle usage or respiratory distress.     Breath sounds: Wheezing present. No decreased breath sounds or rhonchi.     Comments: Occasional expiratory wheezes noted throughout. Abdominal:     General: Bowel sounds are normal. There is no distension.     Palpations: Abdomen is soft.     Tenderness: There is no abdominal tenderness.  Musculoskeletal:     Cervical back: Normal range of motion and neck supple.     Right lower leg: No edema.     Left lower leg: No edema.  Lymphadenopathy:     Head:     Right side of head: No submental, submandibular, tonsillar, preauricular or posterior auricular adenopathy.     Left side of head: No submental, submandibular, tonsillar, preauricular or posterior auricular adenopathy.     Cervical: No cervical adenopathy.  Skin:    General: Skin is warm and dry.  Neurological:     Mental Status: She is alert and oriented to person, place, and time.  Psychiatric:        Attention and Perception: Attention normal.        Mood and Affect: Mood normal.        Speech: Speech normal.        Behavior: Behavior normal. Behavior is cooperative.        Thought Content: Thought content normal.    Results for orders placed or performed in visit on 02/25/22  Bayer DCA Hb A1c Waived  Result Value Ref Range   HB A1C (BAYER DCA - WAIVED) 7.1 (H) 4.8 - 5.6 %  CBC with Differential/Platelet  Result Value Ref Range   WBC 5.8 3.4 - 10.8 x10E3/uL   RBC 4.23 3.77 - 5.28 x10E6/uL   Hemoglobin 12.6 11.1 - 15.9 g/dL   Hematocrit 37.3 34.0 - 46.6 %   MCV 88 79 - 97 fL   MCH 29.8 26.6 - 33.0 pg   MCHC 33.8 31.5 - 35.7 g/dL   RDW 12.2 11.7 - 15.4 %   Platelets 491 (H) 150 - 450 x10E3/uL   Neutrophils 50 Not Estab. %   Lymphs 38 Not Estab. %   Monocytes 7 Not Estab. %   Eos 3 Not Estab. %   Basos 1 Not Estab. %   Neutrophils Absolute 3.0 1.4 -  7.0 x10E3/uL   Lymphocytes Absolute 2.2 0.7 - 3.1 x10E3/uL   Monocytes Absolute 0.4 0.1 - 0.9 x10E3/uL   EOS (ABSOLUTE) 0.2 0.0 - 0.4 x10E3/uL   Basophils Absolute 0.1 0.0 - 0.2 x10E3/uL   Immature Granulocytes 1 Not Estab. %   Immature Grans (  Abs) 0.0 0.0 - 0.1 x10E3/uL  Comprehensive metabolic panel  Result Value Ref Range   Glucose 135 (H) 70 - 99 mg/dL   BUN 13 8 - 27 mg/dL   Creatinine, Ser 0.63 0.57 - 1.00 mg/dL   eGFR 95 >59 mL/min/1.73   BUN/Creatinine Ratio 21 12 - 28   Sodium 137 134 - 144 mmol/L   Potassium 4.5 3.5 - 5.2 mmol/L   Chloride 95 (L) 96 - 106 mmol/L   CO2 23 20 - 29 mmol/L   Calcium 10.2 8.7 - 10.3 mg/dL   Total Protein 6.9 6.0 - 8.5 g/dL   Albumin 4.4 3.8 - 4.8 g/dL   Globulin, Total 2.5 1.5 - 4.5 g/dL   Albumin/Globulin Ratio 1.8 1.2 - 2.2   Bilirubin Total 0.3 0.0 - 1.2 mg/dL   Alkaline Phosphatase 42 (L) 44 - 121 IU/L   AST 17 0 - 40 IU/L   ALT 18 0 - 32 IU/L  Lipid Panel w/o Chol/HDL Ratio  Result Value Ref Range   Cholesterol, Total 171 100 - 199 mg/dL   Triglycerides 239 (H) 0 - 149 mg/dL   HDL 42 >39 mg/dL   VLDL Cholesterol Cal 40 5 - 40 mg/dL   LDL Chol Calc (NIH) 89 0 - 99 mg/dL  TSH  Result Value Ref Range   TSH 2.350 0.450 - 4.500 uIU/mL  Microalbumin, Urine Waived  Result Value Ref Range   Microalb, Ur Waived 30 (H) 0 - 19 mg/L   Creatinine, Urine Waived 100 10 - 300 mg/dL   Microalb/Creat Ratio <30 <30 mg/g  Vitamin B12  Result Value Ref Range   Vitamin B-12 1,074 232 - 1,245 pg/mL  Magnesium  Result Value Ref Range   Magnesium 1.6 1.6 - 2.3 mg/dL  Amylase  Result Value Ref Range   Amylase 55 31 - 110 U/L  Lipase  Result Value Ref Range   Lipase 62 14 - 85 U/L      Assessment & Plan:   Problem List Items Addressed This Visit       Respiratory   Influenza A - Primary    Acute and lingering cough, not 100% better.  Refer to subacute cough plan of care.      Relevant Medications   azithromycin (ZITHROMAX) 250 MG  tablet   Other Relevant Orders   DG Chest 2 View     Other   Subacute cough    Ongoing since Influenza A, will obtain imaging as higher risk for PNA.  Start Azithromycin, Tessalon, and cough syrup.  No Prednisone due to diabetes, with occasional poor control  Albuterol inhaler sent to use as needed.  Recommend: - Increased rest - Increasing Fluids - Acetaminophen as needed for fever/pain.  - Salt water gargling, chloraseptic spray and throat lozenges - Mucinex.  - Humidifying the air.  Return in one week for lung check.      Relevant Orders   DG Chest 2 View     Follow up plan: Return in about 1 week (around 03/18/2022) for Cough.

## 2022-03-13 NOTE — Progress Notes (Signed)
Contacted via [place] afternoon Bedelia, your imagine returned and no pneumonia is present.  Great news!!

## 2022-03-16 NOTE — Patient Instructions (Signed)

## 2022-03-19 ENCOUNTER — Ambulatory Visit (INDEPENDENT_AMBULATORY_CARE_PROVIDER_SITE_OTHER): Payer: 59 | Admitting: Nurse Practitioner

## 2022-03-19 ENCOUNTER — Encounter: Payer: Self-pay | Admitting: Nurse Practitioner

## 2022-03-19 VITALS — BP 116/73 | HR 91 | Temp 97.4°F | Ht 65.0 in | Wt 157.6 lb

## 2022-03-19 DIAGNOSIS — R052 Subacute cough: Secondary | ICD-10-CM | POA: Diagnosis not present

## 2022-03-19 MED ORDER — ONDANSETRON 4 MG PO TBDP: 4.0000 mg | ORAL_TABLET | Freq: Four times a day (QID) | ORAL | 0 refills | Status: DC | PRN

## 2022-03-19 NOTE — Progress Notes (Signed)
BP 116/73   Pulse 91   Temp (!) 97.4 F (36.3 C) (Oral)   Ht '5\' 5"'$  (1.651 m)   Wt 157 lb 9.6 oz (71.5 kg)   LMP  (LMP Unknown) Comment: age 73  SpO2 95%   BMI 26.23 kg/m    Subjective:    Patient ID: Cheryl Mendez, female    DOB: 06-14-1950, 72 y.o.   MRN: [id number]  HPI: Cheryl Mendez is a 72 y.o. female  Chief Complaint  Patient presents with   Cough    Patient states that her cough is much better    Diarrhea    Started last night.   COUGH Last seen on 03/11/22 for cough post flu -- started Azithromycin and Albuterol.  Cough is improving.    Having some diarrhea, which started last night with stomach discomfort.  Denies vomiting.  Does have nausea, which at baseline with her Rybelsus on occasion.  Takes Metamucil gummies to help with bowels. Duration: weeks Circumstances of initial development of cough:  Flu Cough severity: mild Alleviating factors: abx and Albuterol Status:  better Treatments attempted: abx and Albuterol Wheezing: no Shortness of breath: no Chest pain: no Chest tightness:no Nasal congestion: no Runny nose: yes Postnasal drip: yes Frequent throat clearing or swallowing: yes Hemoptysis: no Fevers: no Night sweats: no Weight loss: no Heartburn: no Recent foreign travel: no Tuberculosis contacts: no   Relevant past medical, surgical, family and social history reviewed and updated as indicated. Interim medical history since our last visit reviewed. Allergies and medications reviewed and updated.  Review of Systems  Constitutional:  Negative for activity change, appetite change, diaphoresis, fatigue and fever.  HENT: Negative.    Respiratory:  Negative for cough, chest tightness, shortness of breath and wheezing.   Cardiovascular:  Negative for chest pain, palpitations and leg swelling.  Gastrointestinal:  Positive for abdominal pain, diarrhea and nausea. Negative for abdominal distention, constipation and vomiting.  Neurological:  Negative.   Psychiatric/Behavioral: Negative.     Per HPI unless specifically indicated above     Objective:    BP 116/73   Pulse 91   Temp (!) 97.4 F (36.3 C) (Oral)   Ht '5\' 5"'$  (1.651 m)   Wt 157 lb 9.6 oz (71.5 kg)   LMP  (LMP Unknown) Comment: age 54  SpO2 95%   BMI 26.23 kg/m   Wt Readings from Last 3 Encounters:  03/19/22 157 lb 9.6 oz (71.5 kg)  03/11/22 160 lb 4.8 oz (72.7 kg)  02/25/22 158 lb 8 oz (71.9 kg)    Physical Exam Vitals and nursing note reviewed.  Constitutional:      General: She is awake. She is not in acute distress.    Appearance: She is well-developed and well-groomed. She is not ill-appearing or toxic-appearing.  HENT:     Head: Normocephalic.     Right Ear: Hearing, ear canal and external ear normal. A middle ear effusion is present. Tympanic membrane is not injected or perforated.     Left Ear: Hearing, ear canal and external ear normal. A middle ear effusion is present. Tympanic membrane is not injected or perforated.     Nose: Rhinorrhea present. Rhinorrhea is clear.     Right Sinus: No maxillary sinus tenderness or frontal sinus tenderness.     Left Sinus: No maxillary sinus tenderness or frontal sinus tenderness.     Mouth/Throat:     Mouth: Mucous membranes are moist.     Pharynx: Posterior  oropharyngeal erythema (mild) present. No pharyngeal swelling or oropharyngeal exudate.  Eyes:     General: Lids are normal.        Right eye: No discharge.        Left eye: No discharge.     Conjunctiva/sclera: Conjunctivae normal.     Pupils: Pupils are equal, round, and reactive to light.  Neck:     Thyroid: No thyromegaly.     Vascular: No carotid bruit.  Cardiovascular:     Rate and Rhythm: Normal rate and regular rhythm.     Heart sounds: Normal heart sounds. No murmur heard.    No gallop.  Pulmonary:     Effort: Pulmonary effort is normal. No accessory muscle usage or respiratory distress.     Breath sounds: No decreased breath sounds,  wheezing or rhonchi.  Abdominal:     General: Bowel sounds are normal. There is no distension.     Palpations: Abdomen is soft.     Tenderness: There is no abdominal tenderness.  Musculoskeletal:     Cervical back: Normal range of motion and neck supple.     Right lower leg: No edema.     Left lower leg: No edema.  Lymphadenopathy:     Head:     Right side of head: No submental, submandibular, tonsillar, preauricular or posterior auricular adenopathy.     Left side of head: No submental, submandibular, tonsillar, preauricular or posterior auricular adenopathy.     Cervical: No cervical adenopathy.  Skin:    General: Skin is warm and dry.  Neurological:     Mental Status: She is alert and oriented to person, place, and time.  Psychiatric:        Attention and Perception: Attention normal.        Mood and Affect: Mood normal.        Speech: Speech normal.        Behavior: Behavior normal. Behavior is cooperative.        Thought Content: Thought content normal.    Results for orders placed or performed in visit on 02/25/22  Bayer DCA Hb A1c Waived  Result Value Ref Range   HB A1C (BAYER DCA - WAIVED) 7.1 (H) 4.8 - 5.6 %  CBC with Differential/Platelet  Result Value Ref Range   WBC 5.8 3.4 - 10.8 x10E3/uL   RBC 4.23 3.77 - 5.28 x10E6/uL   Hemoglobin 12.6 11.1 - 15.9 g/dL   Hematocrit 37.3 34.0 - 46.6 %   MCV 88 79 - 97 fL   MCH 29.8 26.6 - 33.0 pg   MCHC 33.8 31.5 - 35.7 g/dL   RDW 12.2 11.7 - 15.4 %   Platelets 491 (H) 150 - 450 x10E3/uL   Neutrophils 50 Not Estab. %   Lymphs 38 Not Estab. %   Monocytes 7 Not Estab. %   Eos 3 Not Estab. %   Basos 1 Not Estab. %   Neutrophils Absolute 3.0 1.4 - 7.0 x10E3/uL   Lymphocytes Absolute 2.2 0.7 - 3.1 x10E3/uL   Monocytes Absolute 0.4 0.1 - 0.9 x10E3/uL   EOS (ABSOLUTE) 0.2 0.0 - 0.4 x10E3/uL   Basophils Absolute 0.1 0.0 - 0.2 x10E3/uL   Immature Granulocytes 1 Not Estab. %   Immature Grans (Abs) 0.0 0.0 - 0.1 x10E3/uL   Comprehensive metabolic panel  Result Value Ref Range   Glucose 135 (H) 70 - 99 mg/dL   BUN 13 8 - 27 mg/dL   Creatinine, Ser 0.63 0.57 - 1.00  mg/dL   eGFR 95 >59 mL/min/1.73   BUN/Creatinine Ratio 21 12 - 28   Sodium 137 134 - 144 mmol/L   Potassium 4.5 3.5 - 5.2 mmol/L   Chloride 95 (L) 96 - 106 mmol/L   CO2 23 20 - 29 mmol/L   Calcium 10.2 8.7 - 10.3 mg/dL   Total Protein 6.9 6.0 - 8.5 g/dL   Albumin 4.4 3.8 - 4.8 g/dL   Globulin, Total 2.5 1.5 - 4.5 g/dL   Albumin/Globulin Ratio 1.8 1.2 - 2.2   Bilirubin Total 0.3 0.0 - 1.2 mg/dL   Alkaline Phosphatase 42 (L) 44 - 121 IU/L   AST 17 0 - 40 IU/L   ALT 18 0 - 32 IU/L  Lipid Panel w/o Chol/HDL Ratio  Result Value Ref Range   Cholesterol, Total 171 100 - 199 mg/dL   Triglycerides 239 (H) 0 - 149 mg/dL   HDL 42 >39 mg/dL   VLDL Cholesterol Cal 40 5 - 40 mg/dL   LDL Chol Calc (NIH) 89 0 - 99 mg/dL  TSH  Result Value Ref Range   TSH 2.350 0.450 - 4.500 uIU/mL  Microalbumin, Urine Waived  Result Value Ref Range   Microalb, Ur Waived 30 (H) 0 - 19 mg/L   Creatinine, Urine Waived 100 10 - 300 mg/dL   Microalb/Creat Ratio <30 <30 mg/g  Vitamin B12  Result Value Ref Range   Vitamin B-12 1,074 232 - 1,245 pg/mL  Magnesium  Result Value Ref Range   Magnesium 1.6 1.6 - 2.3 mg/dL  Amylase  Result Value Ref Range   Amylase 55 31 - 110 U/L  Lipase  Result Value Ref Range   Lipase 62 14 - 85 U/L      Assessment & Plan:   Problem List Items Addressed This Visit       Other   Subacute cough - Primary    Acute and improved at this time.  Imaging was reassuring.  Continue to monitor.  Refilled her Zofran for nausea.  Recommend increased hydration.          Follow up plan: Return in about 2 months (around 05/29/2022) for T2DM, HTN/HLD, GERD.

## 2022-03-19 NOTE — Assessment & Plan Note (Signed)
Acute and improved at this time.  Imaging was reassuring.  Continue to monitor.  Refilled her Zofran for nausea.  Recommend increased hydration.

## 2022-03-29 ENCOUNTER — Telehealth: Payer: Self-pay | Admitting: Nurse Practitioner

## 2022-03-29 NOTE — Telephone Encounter (Signed)
Tried calling patient back, no answer, HIPAA compliant vm left

## 2022-03-29 NOTE — Telephone Encounter (Signed)
Copied from [place] [phone]. Topic: General - Other >> Mar 29, 2022  3:52 PM Sabas Sous wrote: Reason for CRM: Pt wants to speak to a nurse regarding something suspicious she has received on her phone. Specifically wants to speak to her PCP's nurse  Best contact: [phone]  Says she has never received this before. It says Interior and spatial designer.

## 2022-03-31 ENCOUNTER — Other Ambulatory Visit: Payer: Self-pay | Admitting: Nurse Practitioner

## 2022-04-01 ENCOUNTER — Telehealth: Payer: Self-pay

## 2022-04-01 ENCOUNTER — Other Ambulatory Visit: Payer: Self-pay

## 2022-04-01 NOTE — Telephone Encounter (Signed)
Last RF Zofran 03/19/22 #40 RF due: yes Active med list: yes Future visit scheduled: yes Med not delegated to NT to RF  Requested Prescriptions  Pending Prescriptions Disp Refills   ondansetron (ZOFRAN-ODT) 4 MG disintegrating tablet 40 tablet 0    Sig: Take 1 tablet (4 mg total) by mouth every 6 (six) hours as needed for nausea or vomiting.     Not Delegated - Gastroenterology: Antiemetics - ondansetron Failed - 04/01/2022  3:49 PM      Failed - This refill cannot be delegated      Passed - AST in normal range and within 360 days    AST  Date Value Ref Range Status  02/25/2022 17 0 - 40 IU/L Final   SGOT(AST)  Date Value Ref Range Status  06/09/2014 105 (H) U/L Final    Comment:    15-41 NOTE: New Reference Range  04/26/14          Passed - ALT in normal range and within 360 days    ALT  Date Value Ref Range Status  02/25/2022 18 0 - 32 IU/L Final   SGPT (ALT)  Date Value Ref Range Status  06/09/2014 103 (H) U/L Final    Comment:    14-54 NOTE: New Reference Range  04/26/14          Passed - Valid encounter within last 6 months    Recent Outpatient Visits           1 week ago Subacute cough   [place], Henrine Screws T, NP   3 weeks ago Influenza [place] Mariano [place]an, [place] T, NP   1 month ago Poorly controlled type 2 diabetes mellitus ([place])   [place], Barbaraann Faster, NP   1 month ago Influenza A   [place] P, DO   1 month ago Superficial injury of skin   [place] Mecum, Dani Gobble, PA-C       Future Appointments             In 1 month Cannady, Barbaraann Faster, NP [place]

## 2022-04-01 NOTE — Telephone Encounter (Signed)
Returned patient's call and informed her that her Atorvastatin had been refilled today

## 2022-04-01 NOTE — Telephone Encounter (Signed)
Requested medication (s) are due for refill today: yes (Meclizine)  Requested medication (s) are on the active medication list: Guaifenesin with codeine: no    Meclizine: yes  Last refill:  Meclizine: 02/25/22 #30 Guaifenesin with codeine: 03/11/22 118 ml  Future visit scheduled: yes  Notes to clinic:  NT to delegated to refill or refuse these meds Guaifenesin with codeine was dc 'd 03/19/22    Requested Prescriptions  Pending Prescriptions Disp Refills   guaiFENesin-codeine 100-10 MG/5ML syrup [Pharmacy Med Name: CODEINE-GUAIFEN 10-100MG/5ML SOL] 118 mL     Sig: TAKE 5 ML BY MOUTH TWICE DAILY AS NEEDED FOR COUGH     Off-Protocol Failed - 04/01/2022 11:17 AM      Failed - Medication not assigned to a protocol, review manually.      Passed - Valid encounter within last 12 months    Recent Outpatient Visits           1 week ago Subacute cough   Cheryl Mendez, [place] T, NP   3 weeks ago Influenza Cheryl Mendez, [place] T, NP   1 month ago Poorly controlled type 2 diabetes mellitus ([place])   Cheryl Mendez, Cheryl Faster, NP   1 month ago Influenza [place], Cheryl P, DO   1 month ago Superficial injury of skin   [place], PA-C       Future Appointments             In 1 month Cannady, Cheryl Faster, NP [place], [facility]             meclizine (ANTIVERT) 12.5 MG tablet [employer] Med Name: MECLIZINE 12.5MG (RX) TABLETS] 30 tablet     Sig: TAKE 1 TABLET(12.5 MG) BY MOUTH THREE TIMES DAILY AS NEEDED FOR DIZZINESS     Not Delegated - Gastroenterology: Antiemetics Failed - 04/01/2022 11:17 AM      Failed - This refill cannot be delegated      Passed - Valid encounter within last 6 months    Recent Outpatient Visits           1 week ago Subacute cough   [place]  Cheryl Mendez, Cheryl Screws T, NP   3 weeks ago Influenza [place], Cheryl [place] T, NP   1 month ago Poorly controlled type 2 diabetes mellitus ([place])   [place], Cheryl Faster, NP   1 month ago Influenza [place], Cheryl P, DO   1 month ago Superficial injury of skin   [place], Cheryl Gobble, PA-C       Future Appointments             In 1 month Cannady, [place] T, NP [place], [facility]            Signed Prescriptions Disp Refills   atorvastatin (LIPITOR) 10 MG tablet 90 tablet 3    Sig: TAKE 1 TABLET(10 MG) BY MOUTH DAILY     Cardiovascular:  Antilipid - Statins Failed - 04/01/2022 11:17 AM      Failed - Lipid Panel in normal range within the last 12 months    Cholesterol, Total  Date Value Ref Range Status  02/25/2022 171 100 -  199 mg/dL Final   Cholesterol Piccolo, [place]  Date Value Ref Range Status  07/28/2018 159 <200 mg/dL Final    Comment:                            Desirable                <200                         Borderline High      200- 239                         High                     >239    LDL Chol Calc (NIH)  Date Value Ref Range Status  02/25/2022 89 0 - 99 mg/dL Final   HDL  Date Value Ref Range Status  02/25/2022 42 >39 mg/dL Final   Triglycerides  Date Value Ref Range Status  02/25/2022 239 (H) 0 - 149 mg/dL Final   Triglycerides Piccolo,Waived  Date Value Ref Range Status  07/28/2018 308 (H) <150 mg/dL Final    Comment:                            Normal                   <150                         Borderline High     150 - 199                         High                200 - 499                         Very High                >499          Passed - Patient is not pregnant      Passed - Valid encounter within last 12 months    Recent Outpatient Visits           1 week ago  Subacute cough   [place], [place] T, NP   3 weeks ago Influenza [place], [place] T, NP   1 month ago Poorly controlled type 2 diabetes mellitus ([place])   [place], Cheryl Faster, NP   1 month ago Influenza [place], Cheryl P, DO   1 month ago Superficial injury of skin   [place] [facility] Mecum, Cheryl Gobble, PA-C       Future Appointments             In 1 month Cannady, Cheryl T, NP [place], [facility]             albuterol (VENTOLIN HFA) 108 (90 Base) MCG/ACT inhaler 8.5 g 0    Sig: INHALE 2 PUFFS INTO THE LUNGS EVERY 6 HOURS AS NEEDED FOR WHEEZING  OR SHORTNESS OF BREATH     Pulmonology:  Beta Agonists 2 Passed - 04/01/2022 11:17 AM      Passed - Last BP in normal range    BP Readings from Last 1 Encounters:  03/19/22 116/73         Passed - Last Heart Rate in normal range    Pulse Readings from Last 1 Encounters:  03/19/22 91         Passed - Valid encounter within last 12 months    Recent Outpatient Visits           1 week ago Subacute cough   [place], [place] T, NP   3 weeks ago Influenza [place], [place] T, NP   1 month ago Poorly controlled type 2 diabetes mellitus ([place])   Cheryl Mendez, [place] T, NP   1 month ago Influenza [place], Cheryl P, DO   1 month ago Superficial injury of skin   [place], PA-C       Future Appointments             In 1 month Cannady, [place] T, NP [place], [facility]             metFORMIN (GLUCOPHAGE) 1000 MG tablet 180 tablet 3    Sig: TAKE 1 TABLET(1000 MG) BY MOUTH TWICE DAILY WITH A MEAL     Endocrinology:  Diabetes - Biguanides  Passed - 04/01/2022 11:17 AM      Passed - Cr in normal range and within 360 days    Creatinine  Date Value Ref Range Status  06/09/2014 0.73 mg/dL Final    Comment:    0.44-1.00 NOTE: New Reference Range  04/26/14    Creatinine, Ser  Date Value Ref Range Status  02/25/2022 0.63 0.57 - 1.00 mg/dL Final         Passed - HBA1C is between 0 and 7.9 and within 180 days    Hemoglobin A1C  Date Value Ref Range Status  10/17/2015 7.4%  Final   HB A1C (BAYER DCA - WAIVED)  Date Value Ref Range Status  02/25/2022 7.1 (H) 4.8 - 5.6 % Final    Comment:             Prediabetes: 5.7 - 6.4          Diabetes: >6.4          Glycemic control for adults with diabetes: <7.0          Passed - eGFR in normal range and within 360 days    EGFR (African American)  Date Value Ref Range Status  06/09/2014 >60  Final   GFR calc Af Amer  Date Value Ref Range Status  03/28/2020 103 >59 mL/min/1.73 Final    Comment:    **In accordance with recommendations from the NKF-ASN Task force,**   [facility] is in the process of updating its eGFR calculation to the   2021 CKD-EPI creatinine equation that estimates kidney function   without a race variable.    EGFR (Non-African Amer.)  Date Value Ref Range Status  06/09/2014 >60  Final    Comment:    eGFR values <26m/min/1.73 m2 may be an indication of chronic kidney disease (CKD). Calculated eGFR is useful in patients with stable renal function. The eGFR calculation will  not be reliable in acutely ill patients when serum creatinine is changing rapidly. It is not useful in patients on dialysis. The eGFR calculation may not be applicable to patients at the low and high extremes of body sizes, pregnant women, and vegetarians.    GFR, Estimated  Date Value Ref Range Status  02/12/2022 >60 >60 mL/min Final    Comment:    (NOTE) Calculated using the CKD-EPI Creatinine Equation (2021)    eGFR  Date Value Ref Range Status  02/25/2022 95 >59  mL/min/1.73 Final         Passed - B12 Level in normal range and within 720 days    Vitamin B-12  Date Value Ref Range Status  02/25/2022 1,074 232 - 1,245 pg/mL Final         Passed - Valid encounter within last 6 months    Recent Outpatient Visits           1 week ago Subacute cough   [place] Cheryl Mendez, [place] T, NP   3 weeks ago Influenza [place], [place] T, NP   1 month ago Poorly controlled type 2 diabetes mellitus ([place])   [place], [place] T, NP   1 month ago Influenza A   [place], Cheryl P, DO   1 month ago Superficial injury of skin   [place] [facility] Mecum, Cheryl Gobble, PA-C       Future Appointments             In 1 month Cannady, Cheryl Faster, NP [place], [facility]            Passed - CBC within normal limits and completed in the last 12 months    WBC  Date Value Ref Range Status  02/25/2022 5.8 3.4 - 10.8 x10E3/uL Final  02/12/2022 4.4 4.0 - 10.5 K/uL Final   RBC  Date Value Ref Range Status  02/25/2022 4.23 3.77 - 5.28 x10E6/uL Final  02/12/2022 4.55 3.87 - 5.11 MIL/uL Final   Hemoglobin  Date Value Ref Range Status  02/25/2022 12.6 11.1 - 15.9 g/dL Final   Hematocrit  Date Value Ref Range Status  02/25/2022 37.3 34.0 - 46.6 % Final   MCHC  Date Value Ref Range Status  02/25/2022 33.8 31.5 - 35.7 g/dL Final  02/12/2022 33.1 30.0 - 36.0 g/dL Final   [facility]  Date Value Ref Range Status  02/25/2022 29.8 26.6 - 33.0 pg Final  02/12/2022 29.5 26.0 - 34.0 pg Final   MCV  Date Value Ref Range Status  02/25/2022 88 79 - 97 fL Final  06/09/2014 88 80 - 100 fL Final   No results found for: "PLTCOUNTKUC", "LABPLAT", "POCPLA" RDW  Date Value Ref Range Status  02/25/2022 12.2 11.7 - 15.4 % Final  06/09/2014 12.6 11.5 - 14.5 % Final

## 2022-04-01 NOTE — Telephone Encounter (Signed)
Pt calling to follow up on medication refills.   Please advise.

## 2022-04-01 NOTE — Telephone Encounter (Signed)
Called pharmacy and was advised needed new order for Atorvastatin, Meclizine, Zofran and Albuterol  Requested Prescriptions  Pending Prescriptions Disp Refills   atorvastatin (LIPITOR) 10 MG tablet [Pharmacy Med Name: ATORVASTATIN 10MG TABLETS] 90 tablet 3    Sig: TAKE 1 TABLET(10 MG) BY MOUTH DAILY     Cardiovascular:  Antilipid - Statins Failed - 04/01/2022 11:17 AM      Failed - Lipid Panel in normal range within the last 12 months    Cholesterol, Total  Date Value Ref Range Status  02/25/2022 171 100 - 199 mg/dL Final   Cholesterol Piccolo, Waived  Date Value Ref Range Status  07/28/2018 159 <200 mg/dL Final    Comment:                            Desirable                <200                         Borderline High      200- 239                         High                     >239    LDL Chol Calc (NIH)  Date Value Ref Range Status  02/25/2022 89 0 - 99 mg/dL Final   HDL  Date Value Ref Range Status  02/25/2022 42 >39 mg/dL Final   Triglycerides  Date Value Ref Range Status  02/25/2022 239 (H) 0 - 149 mg/dL Final   Triglycerides Piccolo,Waived  Date Value Ref Range Status  07/28/2018 308 (H) <150 mg/dL Final    Comment:                            Normal                   <150                         Borderline High     150 - 199                         High                200 - 499                         Very High                >499          Passed - Patient is not pregnant      Passed - Valid encounter within last 12 months    Recent Outpatient Visits           1 week ago Subacute cough   [place], [place] T, NP   3 weeks ago Influenza [place], [place] T, NP   1 month ago Poorly controlled type 2 diabetes mellitus ([place])   [place], Barbaraann Faster, NP   1 month ago Influenza [place], [place]  P, DO   1 month  ago Superficial injury of skin   [place] [facility] Mecum, Dani Gobble, PA-C       Future Appointments             In 1 month Cannady, Barbaraann Faster, NP [place], [facility]             albuterol (VENTOLIN HFA) 108 (90 Base) MCG/ACT inhaler [Pharmacy Med Name: ALBUTEROL HFA INH (200 PUFFS) 8.5GM] 8.5 g 0    Sig: INHALE 2 PUFFS INTO THE LUNGS EVERY 6 HOURS AS NEEDED FOR WHEEZING OR SHORTNESS OF BREATH     Pulmonology:  Beta Agonists 2 Passed - 04/01/2022 11:17 AM      Passed - Last BP in normal range    BP Readings from Last 1 Encounters:  03/19/22 116/73         Passed - Last Heart Rate in normal range    Pulse Readings from Last 1 Encounters:  03/19/22 91         Passed - Valid encounter within last 12 months    Recent Outpatient Visits           1 week ago Subacute cough   [place], Henrine Screws T, NP   3 weeks ago Influenza [place], [place] T, NP   1 month ago Poorly controlled type 2 diabetes mellitus ([place])   [place], Barbaraann Faster, NP   1 month ago Influenza [place], Megan P, DO   1 month ago Superficial injury of skin   [place], PA-C       Future Appointments             In 1 month Cannady, Barbaraann Faster, NP [place], [facility]             guaiFENesin-codeine 100-10 MG/5ML syrup [Pharmacy Med Name: CODEINE-GUAIFEN 10-100MG/5ML SOL] 118 mL     Sig: TAKE 5 ML BY MOUTH TWICE DAILY AS NEEDED FOR COUGH     Off-Protocol Failed - 04/01/2022 11:17 AM      Failed - Medication not assigned to a protocol, review manually.      Passed - Valid encounter within last 12 months    Recent Outpatient Visits           1 week ago Subacute cough   [place], [place] T, NP   3 weeks ago Influenza [place], [place] T, NP   1 month ago Poorly controlled type 2 diabetes mellitus ([place])   [place], Barbaraann Faster, NP   1 month ago Influenza A   [place], DO   1 month ago Superficial injury of skin   [place], PA-C       Future Appointments             In 1 month Cannady, Barbaraann Faster, NP [place], [facility]             metFORMIN (GLUCOPHAGE) 1000 MG tablet [Pharmacy Med Name: METFORMIN 1000MG TABLETS] 180 tablet 3    Sig: TAKE 1 TABLET(1000 MG) BY MOUTH TWICE DAILY WITH A MEAL  Endocrinology:  Diabetes - Biguanides Passed - 04/01/2022 11:17 AM      Passed - Cr in normal range and within 360 days    Creatinine  Date Value Ref Range Status  06/09/2014 0.73 mg/dL Final    Comment:    0.44-1.00 NOTE: New Reference Range  04/26/14    Creatinine, Ser  Date Value Ref Range Status  02/25/2022 0.63 0.57 - 1.00 mg/dL Final         Passed - HBA1C is between 0 and 7.9 and within 180 days    Hemoglobin A1C  Date Value Ref Range Status  10/17/2015 7.4%  Final   HB A1C (BAYER DCA - WAIVED)  Date Value Ref Range Status  02/25/2022 7.1 (H) 4.8 - 5.6 % Final    Comment:             Prediabetes: 5.7 - 6.4          Diabetes: >6.4          Glycemic control for adults with diabetes: <7.0          Passed - eGFR in normal range and within 360 days    EGFR (African American)  Date Value Ref Range Status  06/09/2014 >60  Final   GFR calc Af Amer  Date Value Ref Range Status  03/28/2020 103 >59 mL/min/1.73 Final    Comment:    **In accordance with recommendations from the NKF-ASN Task force,**   [facility] is in the process of updating its eGFR calculation to the   2021 CKD-EPI creatinine equation that estimates kidney function   without a race variable.    EGFR (Non-African Amer.)  Date Value Ref Range  Status  06/09/2014 >60  Final    Comment:    eGFR values <76m/min/1.73 m2 may be an indication of chronic kidney disease (CKD). Calculated eGFR is useful in patients with stable renal function. The eGFR calculation will not be reliable in acutely ill patients when serum creatinine is changing rapidly. It is not useful in patients on dialysis. The eGFR calculation may not be applicable to patients at the low and high extremes of body sizes, pregnant women, and vegetarians.    GFR, Estimated  Date Value Ref Range Status  02/12/2022 >60 >60 mL/min Final    Comment:    (NOTE) Calculated using the CKD-EPI Creatinine Equation (2021)    eGFR  Date Value Ref Range Status  02/25/2022 95 >59 mL/min/1.73 Final         Passed - B12 Level in normal range and within 720 days    Vitamin B-12  Date Value Ref Range Status  02/25/2022 1,074 232 - 1,245 pg/mL Final         Passed - Valid encounter within last 6 months    Recent Outpatient Visits           1 week ago Subacute cough   [place] JHenrine ScrewsT, NP   3 weeks ago Influenza [place], NP   1 month ago Poorly controlled type 2 diabetes mellitus ([place]   [place] JBarbaraann Faster NP   1 month ago Influenza A   [place] DO   1 month ago Superficial injury of skin   [place] EDani Gobble PA-C       Future Appointments  In 1 month Cannady, Barbaraann Faster, NP [place], [facility]            Passed - CBC within normal limits and completed in the last 12 months    WBC  Date Value Ref Range Status  02/25/2022 5.8 3.4 - 10.8 x10E3/uL Final  02/12/2022 4.4 4.0 - 10.5 K/uL Final   RBC  Date Value Ref Range Status  02/25/2022 4.23 3.77 - 5.28 x10E6/uL Final  02/12/2022 4.55 3.87 - 5.11 MIL/uL Final   Hemoglobin   Date Value Ref Range Status  02/25/2022 12.6 11.1 - 15.9 g/dL Final   Hematocrit  Date Value Ref Range Status  02/25/2022 37.3 34.0 - 46.6 % Final   MCHC  Date Value Ref Range Status  02/25/2022 33.8 31.5 - 35.7 g/dL Final  02/12/2022 33.1 30.0 - 36.0 g/dL Final   [facility]  Date Value Ref Range Status  02/25/2022 29.8 26.6 - 33.0 pg Final  02/12/2022 29.5 26.0 - 34.0 pg Final   MCV  Date Value Ref Range Status  02/25/2022 88 79 - 97 fL Final  06/09/2014 88 80 - 100 fL Final   No results found for: "PLTCOUNTKUC", "LABPLAT", "POCPLA" RDW  Date Value Ref Range Status  02/25/2022 12.2 11.7 - 15.4 % Final  06/09/2014 12.6 11.5 - 14.5 % Final          meclizine (ANTIVERT) 12.5 MG tablet [Pharmacy Med Name: MECLIZINE 12.5MG (RX) TABLETS] 30 tablet     Sig: TAKE 1 TABLET(12.5 MG) BY MOUTH THREE TIMES DAILY AS NEEDED FOR DIZZINESS     Not Delegated - Gastroenterology: Antiemetics Failed - 04/01/2022 11:17 AM      Failed - This refill cannot be delegated      Passed - Valid encounter within last 6 months    Recent Outpatient Visits           1 week ago Subacute cough   [place], Henrine Screws T, NP   3 weeks ago Influenza [place], [place] T, NP   1 month ago Poorly controlled type 2 diabetes mellitus ([place])   [place], Barbaraann Faster, NP   1 month ago Influenza A   [place] P, DO   1 month ago Superficial injury of skin   [place] Mecum, Dani Gobble, PA-C       Future Appointments             In 1 month Cannady, Barbaraann Faster, NP [place], [facility]

## 2022-04-01 NOTE — Telephone Encounter (Signed)
Pt returning missed call.   Please advise.

## 2022-04-01 NOTE — Telephone Encounter (Signed)
PA approved for Rybelsus 66m tab

## 2022-04-01 NOTE — Telephone Encounter (Signed)
Per pharmacy, Pt also wanting refill of Zofran.

## 2022-04-24 ENCOUNTER — Other Ambulatory Visit: Payer: Self-pay | Admitting: Nurse Practitioner

## 2022-04-25 NOTE — Telephone Encounter (Signed)
Requested medication (s) are due for refill today: yes  Requested medication (s) are on the active medication list: yes  Last refill:  04/01/22 #8.5 g 0 refills  Future visit scheduled: yes in 3 weeks   Notes to clinic:  no refills remain. Do you want to refill Rx?     Requested Prescriptions  Pending Prescriptions Disp Refills   albuterol (VENTOLIN HFA) 108 (90 Base) MCG/ACT inhaler [Pharmacy Med Name: ALBUTEROL HFA INH (200 PUFFS) 8.5GM] 8.5 g 0    Sig: INHALE 2 PUFFS INTO THE LUNGS EVERY 6 HOURS AS NEEDED FOR WHEEZING OR SHORTNESS OF BREATH     Pulmonology:  Beta Agonists 2 Passed - 04/24/2022  5:13 PM      Passed - Last BP in normal range    BP Readings from Last 1 Encounters:  03/19/22 116/73         Passed - Last Heart Rate in normal range    Pulse Readings from Last 1 Encounters:  03/19/22 91         Passed - Valid encounter within last 12 months    Recent Outpatient Visits           1 month ago Subacute cough   [place], Henrine Screws T, NP   1 month ago Influenza [place], [place] T, NP   1 month ago Poorly controlled type 2 diabetes mellitus ([place])   [place], Henrine Screws T, NP   2 months ago Influenza A   [place] P, DO   2 months ago Superficial injury of skin   [place], PA-C       Future Appointments             In 3 weeks Cannady, Barbaraann Faster, NP [place], [facility]

## 2022-04-30 ENCOUNTER — Other Ambulatory Visit (INDEPENDENT_AMBULATORY_CARE_PROVIDER_SITE_OTHER): Payer: Self-pay | Admitting: Nurse Practitioner

## 2022-04-30 DIAGNOSIS — I83812 Varicose veins of left lower extremities with pain: Secondary | ICD-10-CM

## 2022-05-01 ENCOUNTER — Encounter (INDEPENDENT_AMBULATORY_CARE_PROVIDER_SITE_OTHER): Payer: Self-pay | Admitting: Nurse Practitioner

## 2022-05-01 ENCOUNTER — Ambulatory Visit (INDEPENDENT_AMBULATORY_CARE_PROVIDER_SITE_OTHER): Payer: 59

## 2022-05-01 ENCOUNTER — Ambulatory Visit (INDEPENDENT_AMBULATORY_CARE_PROVIDER_SITE_OTHER): Payer: 59 | Admitting: Nurse Practitioner

## 2022-05-01 VITALS — BP 122/81 | HR 93 | Resp 16 | Wt 157.8 lb

## 2022-05-01 DIAGNOSIS — I83812 Varicose veins of left lower extremities with pain: Secondary | ICD-10-CM | POA: Diagnosis not present

## 2022-05-01 DIAGNOSIS — E1169 Type 2 diabetes mellitus with other specified complication: Secondary | ICD-10-CM | POA: Diagnosis not present

## 2022-05-01 DIAGNOSIS — E785 Hyperlipidemia, unspecified: Secondary | ICD-10-CM | POA: Diagnosis not present

## 2022-05-01 DIAGNOSIS — E1165 Type 2 diabetes mellitus with hyperglycemia: Secondary | ICD-10-CM

## 2022-05-14 ENCOUNTER — Encounter (INDEPENDENT_AMBULATORY_CARE_PROVIDER_SITE_OTHER): Payer: Self-pay | Admitting: Nurse Practitioner

## 2022-05-18 NOTE — Patient Instructions (Signed)
Diabetes Mellitus Basics  Diabetes mellitus, or diabetes, is a long-term (chronic) disease. It occurs when the body does not properly use sugar (glucose) that is released from food after you eat. Diabetes mellitus may be caused by one or both of these problems: Your pancreas does not make enough of a hormone called insulin. Your body does not react in a normal way to the insulin that it makes. Insulin lets glucose enter cells in your body. This gives you energy. If you have diabetes, glucose cannot get into cells. This causes high blood glucose (hyperglycemia). How to treat and manage diabetes You may need to take insulin or other diabetes medicines daily to keep your glucose in balance. If you are prescribed insulin, you will learn how to give yourself insulin by injection. You may need to adjust the amount of insulin you take based on the foods that you eat. You will need to check your blood glucose levels using a glucose monitor as told by your health care provider. The readings can help determine if you have low or high blood glucose. Generally, you should have these blood glucose levels: Before meals (preprandial): 80-130 mg/dL (4.4-7.2 mmol/L). After meals (postprandial): below 180 mg/dL (10 mmol/L). Hemoglobin A1c (HbA1c) level: less than 7%. Your health care provider will set treatment goals for you. Keep all follow-up visits. This is important. Follow these instructions at home: Diabetes medicines Take your diabetes medicines every day as told by your health care provider. List your diabetes medicines here: Name of medicine: ______________________________ Amount (dose): _______________ Time (a.m./p.m.): _______________ Notes: ___________________________________ Name of medicine: ______________________________ Amount (dose): _______________ Time (a.m./p.m.): _______________ Notes: ___________________________________ Name of medicine: ______________________________ Amount (dose):  _______________ Time (a.m./p.m.): _______________ Notes: ___________________________________ Insulin If you use insulin, list the types of insulin you use here: Insulin type: ______________________________ Amount (dose): _______________ Time (a.m./p.m.): _______________Notes: ___________________________________ Insulin type: ______________________________ Amount (dose): _______________ Time (a.m./p.m.): _______________ Notes: ___________________________________ Insulin type: ______________________________ Amount (dose): _______________ Time (a.m./p.m.): _______________ Notes: ___________________________________ Insulin type: ______________________________ Amount (dose): _______________ Time (a.m./p.m.): _______________ Notes: ___________________________________ Insulin type: ______________________________ Amount (dose): _______________ Time (a.m./p.m.): _______________ Notes: ___________________________________ Managing blood glucose  Check your blood glucose levels using a glucose monitor as told by your health care provider. Write down the times that you check your glucose levels here: Time: _______________ Notes: ___________________________________ Time: _______________ Notes: ___________________________________ Time: _______________ Notes: ___________________________________ Time: _______________ Notes: ___________________________________ Time: _______________ Notes: ___________________________________ Time: _______________ Notes: ___________________________________  Low blood glucose Low blood glucose (hypoglycemia) is when glucose is at or below 70 mg/dL (3.9 mmol/L). Symptoms may include: Feeling: Hungry. Sweaty and clammy. Irritable or easily upset. Dizzy. Sleepy. Having: A fast heartbeat. A headache. A change in your vision. Numbness around the mouth, lips, or tongue. Having trouble with: Moving (coordination). Sleeping. Treating low blood glucose To treat low blood  glucose, eat or drink something containing sugar right away. If you can think clearly and swallow safely, follow the 15:15 rule: Take 15 grams of a fast-acting carb (carbohydrate), as told by your health care provider. Some fast-acting carbs are: Glucose tablets: take 3-4 tablets. Hard candy: eat 3-5 pieces. Fruit juice: drink 4 oz (120 mL). Regular (not diet) soda: drink 4-6 oz (120-180 mL). Honey or sugar: eat 1 Tbsp (15 mL). Check your blood glucose levels 15 minutes after you take the carb. If your glucose is still at or below 70 mg/dL (3.9 mmol/L), take 15 grams of a carb again. If your glucose does not go above 70 mg/dL (3.9 mmol/L) after   3 tries, get help right away. After your glucose goes back to normal, eat a meal or a snack within 1 hour. Treating very low blood glucose If your glucose is at or below 54 mg/dL (3 mmol/L), you have very low blood glucose (severe hypoglycemia). This is an emergency. Do not wait to see if the symptoms will go away. Get medical help right away. Call your local emergency services (911 in the [place] Do not drive yourself to the hospital. Questions to ask your health care provider Should I talk with a diabetes educator? What equipment will I need to care for myself at home? What diabetes medicines do I need? When should I take them? How often do I need to check my blood glucose levels? What number can I call if I have questions? When is my follow-up visit? Where can I find a support group for people with diabetes? Where to find more information American Diabetes Association: www.diabetes.org Association of Diabetes Care and Education Specialists: www.diabeteseducator.org Contact a health care provider if: Your blood glucose is at or above 240 mg/dL (13.3 mmol/L) for 2 days in a row. You have been sick or have had a fever for 2 days or more, and you are not getting better. You have any of these problems for more than 6 hours: You cannot eat or  drink. You feel nauseous. You vomit. You have diarrhea. Get help right away if: Your blood glucose is lower than 54 mg/dL (3 mmol/L). You get confused. You have trouble thinking clearly. You have trouble breathing. These symptoms may represent a serious problem that is an emergency. Do not wait to see if the symptoms will go away. Get medical help right away. Call your local emergency services (911 in the [place] Do not drive yourself to the hospital. Summary Diabetes mellitus is a chronic disease that occurs when the body does not properly use sugar (glucose) that is released from food after you eat. Take insulin and diabetes medicines as told. Check your blood glucose every day, as often as told. Keep all follow-up visits. This is important. This information is not intended to replace advice given to you by your health care provider. Make sure you discuss any questions you have with your health care provider. Document Revised: 06/08/2019 Document Reviewed: 06/08/2019 Elsevier Patient Education  2023 [employer] Inc.  

## 2022-05-20 ENCOUNTER — Ambulatory Visit (INDEPENDENT_AMBULATORY_CARE_PROVIDER_SITE_OTHER): Payer: 59 | Admitting: Nurse Practitioner

## 2022-05-20 ENCOUNTER — Encounter: Payer: Self-pay | Admitting: Nurse Practitioner

## 2022-05-20 VITALS — BP 114/72 | HR 93 | Temp 97.8°F | Wt 159.6 lb

## 2022-05-20 DIAGNOSIS — I152 Hypertension secondary to endocrine disorders: Secondary | ICD-10-CM

## 2022-05-20 DIAGNOSIS — I83812 Varicose veins of left lower extremities with pain: Secondary | ICD-10-CM

## 2022-05-20 DIAGNOSIS — F5101 Primary insomnia: Secondary | ICD-10-CM | POA: Diagnosis not present

## 2022-05-20 DIAGNOSIS — E785 Hyperlipidemia, unspecified: Secondary | ICD-10-CM

## 2022-05-20 DIAGNOSIS — K219 Gastro-esophageal reflux disease without esophagitis: Secondary | ICD-10-CM

## 2022-05-20 DIAGNOSIS — E1169 Type 2 diabetes mellitus with other specified complication: Secondary | ICD-10-CM

## 2022-05-20 DIAGNOSIS — G4700 Insomnia, unspecified: Secondary | ICD-10-CM | POA: Insufficient documentation

## 2022-05-20 DIAGNOSIS — E1159 Type 2 diabetes mellitus with other circulatory complications: Secondary | ICD-10-CM | POA: Diagnosis not present

## 2022-05-20 DIAGNOSIS — E1165 Type 2 diabetes mellitus with hyperglycemia: Secondary | ICD-10-CM | POA: Diagnosis not present

## 2022-05-20 DIAGNOSIS — M1712 Unilateral primary osteoarthritis, left knee: Secondary | ICD-10-CM | POA: Diagnosis not present

## 2022-05-20 DIAGNOSIS — Z1283 Encounter for screening for malignant neoplasm of skin: Secondary | ICD-10-CM

## 2022-05-20 LAB — BAYER DCA HB A1C WAIVED: HB A1C (BAYER DCA - WAIVED): 6.8 % — ABNORMAL HIGH (ref 4.8–5.6)

## 2022-05-20 LAB — MICROALBUMIN, URINE WAIVED
Creatinine, Urine Waived: 50 mg/dL (ref 10–300)
Microalb, Ur Waived: 30 mg/L — ABNORMAL HIGH (ref 0–19)

## 2022-05-20 MED ORDER — SENNA-DOCUSATE SODIUM 8.6-50 MG PO TABS: 1.0000 | ORAL_TABLET | Freq: Two times a day (BID) | ORAL | 12 refills | Status: DC

## 2022-05-20 MED ORDER — TRAZODONE HCL 50 MG PO TABS: 25.0000 mg | ORAL_TABLET | Freq: Every evening | ORAL | 3 refills | Status: DC | PRN

## 2022-05-20 MED ORDER — TIZANIDINE HCL 4 MG PO TABS: 4.0000 mg | ORAL_TABLET | Freq: Four times a day (QID) | ORAL | 12 refills | Status: DC | PRN

## 2022-05-20 NOTE — Progress Notes (Signed)
BP 114/72   Pulse 93   Temp 97.8 F (36.6 C) (Oral)   Wt 159 lb 9.6 oz (72.4 kg)   LMP  (LMP Unknown) Comment: age 72  SpO2 96%   BMI 26.56 kg/m    Subjective:    Patient ID: Cheryl Mendez, female    DOB: Jan 23, 1951, 72 y.o.   MRN: [id number]  HPI: Cheryl Mendez is a 72 y.o. female  Chief Complaint  Patient presents with   Diabetes    Pt states she has only been taking 1 tablet of her Metformin, states she has not had a recent eye exam    Hyperlipidemia   Hypertension   Referral    Pt states she would like to go to Dermatology for have skin tags and moles removed. States they are catching on her clothes.    Knee Pain    Pt states she would like a prescription for a muscle relaxer for when her knee and shoulder are bothering her   Constipation    Pt states she would like to discuss issues with constipation she has been having. States she has tried taking fiber gummies and milk of magnesia. States that she has not been having regular bowel movements since starting the Rybelsus.    She is requesting dermatology referral today for overall skin exam and lesion removal.  DIABETES A1c 7.1% in January.  Currently taking Rybelsus 7 MG (can not tolerate higher doses due to elevation in pancreas levels with this) and Metformin 1000 MG once daily (not taking BID as ordered as prefers not to).  Has lost 14 pounds with Rybelsus.  Continues to take B12 daily for deficiency.  She endorses some bloating and occasional constipation with Rybelsus -- is taking occasional Metamucil gummies + Milk of Mag.  Is not eating much fiber rich food.  Drinking plenty of water.  In past took Trulicity with GI issues and Invokana stopped due to foot concerns + Jardiance made her sick. Hypoglycemic episodes:no Polydipsia/polyuria: no Visual disturbance: no Chest pain: no Paresthesias: no Glucose Monitoring: no  Accucheck frequency: occasional  Fasting glucose: 120 on average, occasional 140  Post  prandial:  Evening:  Before meals: Taking Insulin?: no  Long acting insulin:  Short acting insulin: Blood Pressure Monitoring: not checking Retinal Examination: Up to Date - Dr. Gloriann Loan in March Foot Exam: Up to Date Pneumovax: Up to Date Influenza: Up to Date Aspirin: no   HYPERTENSION / HYPERLIPIDEMIA Currently taking Lotensin 20-25 and Atorvastatin 10 MG.  Satisfied with current treatment? yes Duration of hypertension: chronic BP monitoring frequency: not checking BP range:  BP medication side effects: no Duration of hyperlipidemia: chronic Cholesterol medication side effects: no Cholesterol supplements: none Medication compliance: good compliance Aspirin: no Recent stressors: no Recurrent headaches: no Visual changes: no Palpitations: no Dyspnea: no Chest pain: no Lower extremity edema: no Dizzy/lightheaded: no The 10-year ASCVD risk score (Arnett DK, et al., 2019) is: 20.6%   Values used to calculate the score:     Age: 66 years     Sex: Female     Is Non-Hispanic African American: No     Diabetic: Yes     Tobacco smoker: No     Systolic Blood Pressure: 99991111 mmHg     Is BP treated: Yes     HDL Cholesterol: 42 mg/dL     Total Cholesterol: 171 mg/dL  GERD Continues on Omeprazole 40 MG PRN. GERD control status: stable Satisfied with current treatment? yes  Heartburn frequency: occasional Medication side effects: no  Medication compliance: stable Dysphagia: no Odynophagia:  no Hematemesis: no Blood in stool: no EGD: yes   KNEE PAIN (LEFT) Ongoing issue.  Provided knee steroid injection on 02/20/21.  Had imaging -- noted to have significant joint space loss, especially to medial aspect.  Pain improved with steroid injection for awhile.  Has seen [facility] in past. She requests refill on muscle relaxer for her occasional discomfort. Duration: chronic Involved knee: left Mechanism of injury: unknown Location: medial Onset: sudden Severity: moderate Quality:  2/10 Frequency: occasional Radiation: no Aggravating factors: moving, bending, picking things up, walking up hill Status: worsening Treatments attempted:  Tizanidine, Meloxicam, Aleeve, steroid injections Relief with NSAIDs?:   yes Weakness with weight bearing or walking: yes Sensation of giving way: yes Locking: yes Popping: yes Bruising: no Swelling: no Redness: no Paresthesias/decreased sensation: no Fevers: no    INSOMNIA Has tried Melatonin, but these do not help and can make her swimmy headed.  Tried OTC CBD and this did not offer much benefit.  Used to work 2nd shift and other hours in past.  Consistently wakes at 3 am due to this. Duration: years Satisfied with sleep quality: yes Difficulty falling asleep: yes Difficulty staying asleep: no Waking a few hours after sleep onset: no Early morning awakenings: no Daytime hypersomnolence: no Wakes feeling refreshed: yes Good sleep hygiene: yes Apnea: no Snoring: no Depressed/anxious mood: no Recent stress: no Restless legs/nocturnal leg cramps: no Chronic pain/arthritis: yes History of sleep study: no Treatments attempted: melatonin, CBD     Relevant past medical, surgical, family and social history reviewed and updated as indicated. Interim medical history since our last visit reviewed. Allergies and medications reviewed and updated.  Review of Systems  Constitutional:  Negative for activity change, appetite change, diaphoresis, fatigue and fever.  HENT: Negative.    Respiratory:  Negative for cough, chest tightness, shortness of breath and wheezing.   Cardiovascular:  Negative for chest pain, palpitations and leg swelling.  Gastrointestinal: Negative.   Endocrine: Negative for cold intolerance, heat intolerance, polydipsia, polyphagia and polyuria.  Musculoskeletal:  Positive for arthralgias.  Neurological: Negative.   Psychiatric/Behavioral:  Positive for sleep disturbance. Negative for decreased concentration,  self-injury and suicidal ideas. The patient is not nervous/anxious.    Per HPI unless specifically indicated above     Objective:    BP 114/72   Pulse 93   Temp 97.8 F (36.6 C) (Oral)   Wt 159 lb 9.6 oz (72.4 kg)   LMP  (LMP Unknown) Comment: age 1  SpO2 96%   BMI 26.56 kg/m   Wt Readings from Last 3 Encounters:  05/20/22 159 lb 9.6 oz (72.4 kg)  05/01/22 157 lb 12.8 oz (71.6 kg)  03/19/22 157 lb 9.6 oz (71.5 kg)    Physical Exam Vitals and nursing note reviewed.  Constitutional:      General: She is awake. She is not in acute distress.    Appearance: She is well-developed and overweight. She is not ill-appearing.  HENT:     Head: Normocephalic.     Right Ear: Hearing normal.     Left Ear: Hearing normal.  Eyes:     General: Lids are normal.        Right eye: No discharge.        Left eye: No discharge.     Conjunctiva/sclera: Conjunctivae normal.     Pupils: Pupils are equal, round, and reactive to light.  Neck:  Thyroid: No thyromegaly.     Vascular: No carotid bruit or JVD.  Cardiovascular:     Rate and Rhythm: Normal rate and regular rhythm.     Heart sounds: Normal heart sounds. No murmur heard.    No gallop.  Pulmonary:     Effort: Pulmonary effort is normal. No accessory muscle usage or respiratory distress.     Breath sounds: Normal breath sounds.  Abdominal:     General: Bowel sounds are normal. There is no distension.     Palpations: Abdomen is soft.  Musculoskeletal:     Cervical back: Normal range of motion and neck supple.     Right knee: Normal.     Left knee: Crepitus present. No swelling or bony tenderness. Normal range of motion. Tenderness present over the medial joint line.     Instability Tests: Anterior drawer test negative. Posterior drawer test negative. Medial McMurray test negative and lateral McMurray test negative.     Right lower leg: No edema.     Left lower leg: No edema.  Lymphadenopathy:     Cervical: No cervical  adenopathy.  Skin:    General: Skin is warm and dry.  Neurological:     Mental Status: She is alert and oriented to person, place, and time.     Motor: Motor function is intact.     Coordination: Coordination is intact.     Gait: Gait is intact.     Deep Tendon Reflexes: Reflexes are normal and symmetric.     Reflex Scores:      Brachioradialis reflexes are 2+ on the right side and 2+ on the left side.      Patellar reflexes are 2+ on the right side and 2+ on the left side. Psychiatric:        Attention and Perception: Attention normal.        Mood and Affect: Mood normal.        Behavior: Behavior normal. Behavior is cooperative.        Thought Content: Thought content normal.        Judgment: Judgment normal.    Results for orders placed or performed in visit on 05/20/22  Bayer DCA Hb A1c Waived  Result Value Ref Range   HB A1C (BAYER DCA - WAIVED) 6.8 (H) 4.8 - 5.6 %  Microalbumin, Urine Waived  Result Value Ref Range   Microalb, Ur Waived 30 (H) 0 - 19 mg/L   Creatinine, Urine Waived 50 10 - 300 mg/dL   Microalb/Creat Ratio 30-300 (H) <30 mg/g      Assessment & Plan:   Problem List Items Addressed This Visit       Cardiovascular and Mediastinum   Hypertension associated with diabetes    Chronic, stable.  BP at goal in office today.  Continue current medication regimen and adjust as needed -- Lotensin offering kidney protection.  Recommend checking BP at home three mornings a week and documenting for provider review.  LABS: CMP and urine ALB today.  Urine ALB 18 June 2022.  Return in 3 months.      Relevant Orders   Bayer DCA Hb A1c Waived (Completed)   Microalbumin, Urine Waived (Completed)   Comprehensive metabolic panel     Digestive   GERD (gastroesophageal reflux disease)    Chronic, ongoing.  Continue Prilosec 40 MG daily PRN at this time as is offering benefit.  Risks of PPI use were discussed with patient including bone loss, C. Diff diarrhea, pneumonia,  infections, CKD, electrolyte abnormalities.  Pt. Verbalizes understanding and chooses to continue the medication. Mag level up to date.       Relevant Medications   sennosides-docusate sodium (SENOKOT-S) 8.6-50 MG tablet     Endocrine   Hyperlipidemia associated with type 2 diabetes mellitus    Chronic, ongoing.  Continue current medication regimen and adjust as needed.  Lipid panel today.      Relevant Orders   Bayer DCA Hb A1c Waived (Completed)   Comprehensive metabolic panel   Lipid Panel w/o Chol/HDL Ratio   Poorly controlled type 2 diabetes mellitus - Primary    Chronic, ongoing.  A1c today 6.8% continued trend down. Urine ALB 18 June 2022 - on ACE. Continue Lotensin for kidney protection,  Metformin 1000 MG daily and Rybelsus 7 MG daily -- would like to avoid insulin, could consider SGLT2 in future and educated on this. Continue diet changes at home. Recommend to monitor BS daily at home and document daily with fasting BS goal of <130.  Monitor diet closely with focus on diabetic diet and regular exercise. - Eye and Foot exam up to date - On ACE and statin therapy - Vaccinations up to date      Relevant Orders   Bayer DCA Hb A1c Waived (Completed)   Microalbumin, Urine Waived (Completed)     Musculoskeletal and Integument   Osteoarthritis of left knee    Ongoing issue, significant arthritic changes present.  Continue at home regimen and return to ortho as needed.  Refills on Tizanidine sent.      Relevant Medications   tiZANidine (ZANAFLEX) 4 MG tablet     Other   Insomnia    Chronic, ongoing.  No benefit from Melatonin or CBD.  Discussed with her and will trial Trazodone at low dose 25 MG as needed nightly.  Educated her on this medication and use + side effects.  Recommend heavy focus on sleep hygiene techniques, discussed with her.      Other Visit Diagnoses     Skin exam for malignant neoplasm       Referral for skin exam placed.   Relevant Orders    Ambulatory referral to Dermatology        Follow up plan: Return in about 3 months (around 08/19/2022) for T2DM, HTN/HLD, INSOMNIA.

## 2022-05-20 NOTE — Assessment & Plan Note (Signed)
Ongoing issue, significant arthritic changes present.  Continue at home regimen and return to ortho as needed.  Refills on Tizanidine sent.

## 2022-05-20 NOTE — Assessment & Plan Note (Signed)
Chronic, ongoing.  No benefit from Melatonin or CBD.  Discussed with her and will trial Trazodone at low dose 25 MG as needed nightly.  Educated her on this medication and use + side effects.  Recommend heavy focus on sleep hygiene techniques, discussed with her.

## 2022-05-20 NOTE — Assessment & Plan Note (Signed)
Chronic, ongoing.  A1c today 6.8% continued trend down. Urine ALB 18 June 2022 - on ACE. Continue Lotensin for kidney protection,  Metformin 1000 MG daily and Rybelsus 7 MG daily -- would like to avoid insulin, could consider SGLT2 in future and educated on this. Continue diet changes at home. Recommend to monitor BS daily at home and document daily with fasting BS goal of <130.  Monitor diet closely with focus on diabetic diet and regular exercise. - Eye and Foot exam up to date - On ACE and statin therapy - Vaccinations up to date

## 2022-05-20 NOTE — Assessment & Plan Note (Signed)
Chronic, ongoing.  Continue Prilosec 40 MG daily PRN at this time as is offering benefit.  Risks of PPI use were discussed with patient including bone loss, C. Diff diarrhea, pneumonia, infections, CKD, electrolyte abnormalities.  Pt. Verbalizes understanding and chooses to continue the medication. Mag level up to date.

## 2022-05-20 NOTE — Assessment & Plan Note (Signed)
Chronic, ongoing.  Continue current medication regimen and adjust as needed. Lipid panel today. 

## 2022-05-20 NOTE — Assessment & Plan Note (Signed)
Chronic, stable.  BP at goal in office today.  Continue current medication regimen and adjust as needed -- Lotensin offering kidney protection.  Recommend checking BP at home three mornings a week and documenting for provider review.  LABS: CMP and urine ALB today.  Urine ALB 18 June 2022.  Return in 3 months.

## 2022-05-21 LAB — LIPID PANEL W/O CHOL/HDL RATIO
Cholesterol, Total: 186 mg/dL (ref 100–199)
HDL: 47 mg/dL (ref >39)
LDL Chol Calc (NIH): 105 mg/dL — ABNORMAL HIGH (ref 0–99)
Triglycerides: 199 mg/dL — ABNORMAL HIGH (ref 0–149)
VLDL Cholesterol Cal: 34 mg/dL (ref 5–40)

## 2022-05-21 LAB — COMPREHENSIVE METABOLIC PANEL
ALT: 18 IU/L (ref 0–32)
AST: 22 IU/L (ref 0–40)
Albumin/Globulin Ratio: 1.7 (ref 1.2–2.2)
Albumin: 4.5 g/dL (ref 3.8–4.8)
Alkaline Phosphatase: 48 IU/L (ref 44–121)
BUN/Creatinine Ratio: 25 (ref 12–28)
BUN: 16 mg/dL (ref 8–27)
Bilirubin Total: 0.4 mg/dL (ref 0.0–1.2)
CO2: 30 mmol/L — ABNORMAL HIGH (ref 20–29)
Calcium: 10.1 mg/dL (ref 8.7–10.3)
Chloride: 96 mmol/L (ref 96–106)
Creatinine, Ser: 0.63 mg/dL (ref 0.57–1.00)
Globulin, Total: 2.7 g/dL (ref 1.5–4.5)
Glucose: 130 mg/dL — ABNORMAL HIGH (ref 70–99)
Potassium: 4.1 mmol/L (ref 3.5–5.2)
Sodium: 139 mmol/L (ref 134–144)
Total Protein: 7.2 g/dL (ref 6.0–8.5)
eGFR: 95 mL/min/{1.73_m2} (ref >59)

## 2022-05-21 NOTE — Progress Notes (Signed)
Contacted via [place] - but please call as she does not check MyChart consistently:    Good afternoon Cheryl Mendez, your labs have returned: - Kidney function, creatinine and eGFR, remains normal, as is liver function, AST and ALT.  - Cholesterol labs show some elevation in LDL from previous checks, please ensure to come fasting to next visit.  Ensure you are taking Atorvastatin daily and we will adjust dose next visit if still remains elevated.  Any questions? Keep being awesome!!  Thank you for allowing me to participate in your care.  I appreciate you. Kindest regards, Kehaulani Fruin

## 2022-05-21 NOTE — Progress Notes (Signed)
Subjective:    Patient ID: Cheryl Mendez, female    DOB: 1950-11-28, 72 y.o.   MRN: [id number] Chief Complaint  Patient presents with   New Patient (Initial Visit)    Ref Mecum consult LLE varicose veins with pain    The patient is seen for evaluation of symptomatic varicose veins.  The patient was referred by Talitha Givens, PA in regards to concern for varicosities with venous ulcerations.  These wounds have since healed.  There is no history of DVT, PE or superficial thrombophlebitis. There is no history of  hemorrhage. The patient denies a significant family history of varicose veins.  The patient has not worn graduated compression in the past. At the present time the patient has not been using over-the-counter analgesics. There is no history of prior surgical intervention or sclerotherapy.  Today the patient has evidence of deep venous insufficiency and superficial venous reflux in the right lower extremity.  No evidence of DVT or superficial thrombophlebitis noted.    Review of Systems  All other systems reviewed and are negative.      Objective:   Physical Exam Vitals reviewed.  HENT:     Head: Normocephalic.  Cardiovascular:     Rate and Rhythm: Normal rate.  Pulmonary:     Effort: Pulmonary effort is normal.  Skin:    General: Skin is warm and dry.  Neurological:     Mental Status: She is alert and oriented to person, place, and time.  Psychiatric:        Mood and Affect: Mood normal.        Behavior: Behavior normal.        Thought Content: Thought content normal.        Judgment: Judgment normal.     BP 122/81 (BP Location: Left Arm)   Pulse 93   Resp 16   Wt 157 lb 12.8 oz (71.6 kg)   LMP  (LMP Unknown) Comment: age 72  BMI 26.26 kg/m   Past Medical History:  Diagnosis Date   Allergy    Arthritis    Diabetes mellitus without complication    GERD (gastroesophageal reflux disease)    Hyperlipidemia    Hypertension    Lumbago    Migraine      Social History   Socioeconomic History   Marital status: Widowed    Spouse name: Not on file   Number of children: Not on file   Years of education: Not on file   Highest education level: Not on file  Occupational History   Not on file  Tobacco Use   Smoking status: Never   Smokeless tobacco: Never  Vaping Use   Vaping Use: Never used  Substance and Sexual Activity   Alcohol use: No    Alcohol/week: 0.0 standard drinks of alcohol   Drug use: No   Sexual activity: Never  Other Topics Concern   Not on file  Social History Narrative   Not on file   Social Determinants of Health   Financial Resource Strain: Low Risk  (11/01/2021)   Overall Financial Resource Strain (CARDIA)    Difficulty of Paying Living Expenses: Not hard at all  Food Insecurity: No Food Insecurity (11/01/2021)   Hunger Vital Sign    Worried About Running Out of Food in the Last Year: Never true    Ran Out of Food in the Last Year: Never true  Transportation Needs: No Transportation Needs (10/31/2020)   [place] - Transportation  Lack of Transportation (Medical): No    Lack of Transportation (Non-Medical): No  Physical Activity: Inactive (11/01/2021)   Exercise Vital Sign    Days of Exercise per Week: 0 days    Minutes of Exercise per Session: 0 min  Stress: No Stress Concern Present (11/01/2021)   [place]    Feeling of Stress : Not at all  Social Connections: Unknown (11/01/2021)   Social Connection and Isolation Panel [NHANES]    Frequency of Communication with Friends and Family: More than three times a week    Frequency of Social Gatherings with Friends and Family: Twice a week    Attends Religious Services: 1 to 4 times per year    Active Member of [employer] or Organizations: No    Attends Archivist Meetings: Never    Marital Status: Not on file  Intimate Partner Violence: Not At Risk (11/01/2021)   Humiliation, Afraid,  Rape, and Kick questionnaire    Fear of Current or Ex-Partner: No    Emotionally Abused: No    Physically Abused: No    Sexually Abused: No    Past Surgical History:  Procedure Laterality Date   COLONOSCOPY WITH PROPOFOL N/A 06/25/2016   Procedure: COLONOSCOPY WITH PROPOFOL;  Surgeon: Jonathon Bellows, MD;  Location: [facility] ENDOSCOPY;  Service: Endoscopy;  Laterality: N/A;   ESOPHAGOGASTRODUODENOSCOPY (EGD) WITH PROPOFOL N/A 06/25/2016   Procedure: ESOPHAGOGASTRODUODENOSCOPY (EGD) WITH PROPOFOL;  Surgeon: Jonathon Bellows, MD;  Location: [facility] ENDOSCOPY;  Service: Endoscopy;  Laterality: N/A;   NO PAST SURGERIES      Family History  Problem Relation Age of Onset   Diabetes Mother    Heart disease Father    Hypertension Father    Eczema Sister    COPD Sister     Allergies  Allergen Reactions   Penicillin G Benzathine        Latest Ref Rng & Units 02/25/2022    9:39 AM 02/12/2022    7:14 PM 11/01/2020    4:29 PM  CBC  WBC 3.4 - 10.8 x10E3/uL 5.8  4.4  8.7   Hemoglobin 11.1 - 15.9 g/dL 12.6  13.4  13.6   Hematocrit 34.0 - 46.6 % 37.3  40.5  40.0   Platelets 150 - 450 x10E3/uL 491  299  355       CMP     Component Value Date/Time   NA 139 05/20/2022 1002   NA 136 06/09/2014 1620   K 4.1 05/20/2022 1002   K 3.3 (L) 06/09/2014 1620   CL 96 05/20/2022 1002   CL 95 (L) 06/09/2014 1620   CO2 30 (H) 05/20/2022 1002   CO2 32 06/09/2014 1620   GLUCOSE 130 (H) 05/20/2022 1002   GLUCOSE 132 (H) 02/12/2022 1914   GLUCOSE 269 (H) 06/09/2014 1620   BUN 16 05/20/2022 1002   BUN 19 06/09/2014 1620   CREATININE 0.63 05/20/2022 1002   CREATININE 0.73 06/09/2014 1620   CALCIUM 10.1 05/20/2022 1002   CALCIUM 9.6 06/09/2014 1620   PROT 7.2 05/20/2022 1002   PROT 8.4 (H) 06/09/2014 1620   ALBUMIN 4.5 05/20/2022 1002   ALBUMIN 4.5 06/09/2014 1620   AST 22 05/20/2022 1002   AST 105 (H) 06/09/2014 1620   ALT 18 05/20/2022 1002   ALT 103 (H) 06/09/2014 1620   ALKPHOS 48 05/20/2022 1002    ALKPHOS 52 06/09/2014 1620   BILITOT 0.4 05/20/2022 1002   BILITOT 0.6 06/09/2014 1620  GFRNONAA >60 02/12/2022 1914   GFRNONAA >60 06/09/2014 1620   GFRAA 103 03/28/2020 0822   GFRAA >60 06/09/2014 1620     No results found.     Assessment & Plan:   1. Varicose veins of left lower extremity with pain Previously professional ulcerations over the varicosities are resolved.  We discussed treatment of varicose veins ranging from conservative therapy to treatment with endovenous laser ablation.  Following this discussion patient will move forward with conservative therapy at this time including use of medical grade compression, elevation and activity.  However she begins to experience any issues she is advised to follow-up sooner.  We will plan to have her back in 6 months. - VAS [place] LOWER EXTREMITY VENOUS REFLUX  2. Poorly controlled type 2 diabetes mellitus ([place]) Continue hypoglycemic medications as already ordered, these medications have been reviewed and there are no changes at this time.  Hgb A1C to be monitored as already arranged by primary service  3. Hyperlipidemia associated with type 2 diabetes mellitus ([place]) Continue statin as ordered and reviewed, no changes at this time   Current Outpatient Medications on File Prior to Visit  Medication Sig Dispense Refill   albuterol (VENTOLIN HFA) 108 (90 Base) MCG/ACT inhaler INHALE 2 PUFFS INTO THE LUNGS EVERY 6 HOURS AS NEEDED FOR WHEEZING OR SHORTNESS OF BREATH 18 g 3   aspirin 81 MG chewable tablet Chew 81 mg by mouth daily.     atorvastatin (LIPITOR) 10 MG tablet TAKE 1 TABLET(10 MG) BY MOUTH DAILY 90 tablet 3   benazepril-hydrochlorthiazide (LOTENSIN HCT) 20-25 MG tablet Take 1 tablet by mouth daily. 90 tablet 4   Blood Glucose Monitoring Suppl (ONE TOUCH ULTRA 2) w/Device KIT Use to check blood sugar 3 times a day and document results, bring to appointments. Goal is <130 fasting blood sugar and <180 two hours after meals 1 kit 1    fluticasone (FLONASE) 50 MCG/ACT nasal spray Place 2 sprays into both nostrils daily. 16 g 6   loratadine (CLARITIN) 10 MG tablet TAKE 1 TABLET(10 MG) BY MOUTH DAILY 90 tablet 1   meclizine (ANTIVERT) 12.5 MG tablet Take 1 tablet (12.5 mg total) by mouth 3 (three) times daily as needed for dizziness. 30 tablet 0   metFORMIN (GLUCOPHAGE) 1000 MG tablet TAKE 1 TABLET(1000 MG) BY MOUTH TWICE DAILY WITH A MEAL 180 tablet 3   omeprazole (PRILOSEC) 20 MG capsule Take 40 mg by mouth daily.     ondansetron (ZOFRAN-ODT) 4 MG disintegrating tablet Take 1 tablet (4 mg total) by mouth every 6 (six) hours as needed for nausea or vomiting. 40 tablet 0   ONETOUCH DELICA LANCETS FINE MISC USE UP TO FOUR TIMES DAILY AS DIRECTED 100 each 0   ONETOUCH ULTRA test strip TEST FOUR TIMES DAILY 300 strip 1   Semaglutide (RYBELSUS) 7 MG TABS Take 7 mg by mouth daily. 30 tablet 12   No current facility-administered medications on file prior to visit.    There are no Patient Instructions on file for this visit. No follow-ups on file.   Kris Hartmann, NP

## 2022-05-28 ENCOUNTER — Telehealth: Payer: Self-pay | Admitting: Nurse Practitioner

## 2022-05-28 NOTE — Telephone Encounter (Signed)
Copied from CRM [id number]. Topic: Appointment Scheduling - Scheduling Inquiry for Clinic >> May 28, 2022  4:07 PM [name] wrote: Reason for CRM: Patient called stated the Dermatologist that she was referred to can not see her at least a year. She is requesting to see the physician that is behind the [employer] in [place] [address] [facility] [address] [facility]. She does not know the name but she has gone to them before about 45yrs ago. She is also willing to see a provider in [facility]

## 2022-06-03 NOTE — Telephone Encounter (Signed)
OK thank you Debbe Odea for looking

## 2022-06-03 NOTE — Telephone Encounter (Signed)
Is it possible to change her referral to [facility]. Please and thank you

## 2022-06-06 ENCOUNTER — Telehealth: Payer: Self-pay | Admitting: Nurse Practitioner

## 2022-06-06 ENCOUNTER — Encounter: Payer: Self-pay | Admitting: Nurse Practitioner

## 2022-06-06 NOTE — Telephone Encounter (Signed)
Pt is calling to request a letter to be written to her apt complex- [facility]. Pt is requesting a letter to state that the patient has diffculty with her shoulder and knee & foot swelling. Pt is on a ground floor currently. And patient is not able to walk up the stairs to the 2 nd floor apt. Please advise when the letter is ready [phone]  [phone]

## 2022-06-06 NOTE — Telephone Encounter (Signed)
Patient lives on ground floor and the Manager wants to move her to a different appt on the second floor and patient can not climb the stairs. Patient needs a note from PCP stating that she is not physically able to climb stairs and would need to stay on the ground level. With the letter from PCP the Apartment manager can keep her in the apartment that she is currently in.

## 2022-06-07 NOTE — Telephone Encounter (Signed)
Called and informed patient that the letter that she was requesting has been signed and is at the front desk waiting for pick up. Patient verbalized understanding and stated that she would be in to pick up her letter.

## 2022-06-19 ENCOUNTER — Telehealth: Payer: Self-pay | Admitting: Nurse Practitioner

## 2022-06-19 ENCOUNTER — Other Ambulatory Visit: Payer: Self-pay | Admitting: Nurse Practitioner

## 2022-06-19 MED ORDER — RYBELSUS 7 MG PO TABS: 7.0000 mg | ORAL_TABLET | Freq: Every day | ORAL | 12 refills | Status: DC

## 2022-06-19 MED ORDER — METFORMIN HCL 1000 MG PO TABS: ORAL_TABLET | ORAL | 2 refills | Status: DC

## 2022-06-19 MED ORDER — ATORVASTATIN CALCIUM 10 MG PO TABS: ORAL_TABLET | ORAL | 2 refills | Status: DC

## 2022-06-19 MED ORDER — OMEPRAZOLE 20 MG PO CPDR: 40.0000 mg | DELAYED_RELEASE_CAPSULE | Freq: Every day | ORAL | 4 refills | Status: DC

## 2022-06-19 MED ORDER — BENAZEPRIL-HYDROCHLOROTHIAZIDE 20-25 MG PO TABS: 1.0000 | ORAL_TABLET | Freq: Every day | ORAL | 3 refills | Status: DC

## 2022-06-19 NOTE — Telephone Encounter (Signed)
Requested medication (s) are due for refill today -no  Requested medication (s) are on the active medication list -yes  Future visit scheduled -yes  Last refill: omeprazole- listed as historical provider                  Semaglutide- 02/25/22 #30 12RF- off protocol                    Notes to clinic: Patient is requesting #100 for refills on these remaining Rx   Requested Prescriptions  Pending Prescriptions Disp Refills   omeprazole (PRILOSEC) 20 MG capsule      Sig: Take 2 capsules (40 mg total) by mouth daily.     Gastroenterology: Proton Pump Inhibitors Passed - 06/19/2022  9:59 AM      Passed - Valid encounter within last 12 months    Recent Outpatient Visits           1 month ago Poorly controlled type 2 diabetes mellitus (HCC)   [place] [place], [place] T, NP   3 months ago Subacute cough   [place] [facility] [place], [place] T, NP   3 months ago Influenza A   [place] [place], [place] T, NP   3 months ago Poorly controlled type 2 diabetes mellitus (HCC)   [facility] [place], Dorie Rank, NP   4 months ago Influenza A   [place] [facility] Dorcas Carrow, DO       Future Appointments             In 2 months Marjie Skiff, NP [facility]   In 10 months Deirdre Evener, MD [facility]             Semaglutide (RYBELSUS) 7 MG TABS 30 tablet 12    Sig: Take 1 tablet (7 mg total) by mouth daily.     Off-Protocol Failed - 06/19/2022  9:59 AM      Failed - Medication not assigned to a protocol, review manually.      Passed - Valid encounter within last 12 months    Recent Outpatient Visits           1 month ago Poorly controlled type 2 diabetes mellitus (HCC)   [place] [facility] [place], [place] T, NP   3 months ago Subacute cough   [facility] [place],  B and E T, NP   3 months ago Influenza A   [facility] [place] [facility] [place], [place] T, NP   3 months ago Poorly controlled type 2 diabetes mellitus (HCC)   [facility] [place], Dorie Rank, NP   4 months ago Influenza A    [facility] Dorcas Carrow, DO       Future Appointments             In 2 months Cannady, Dorie Rank, NP  [facility]   In 10 months Deirdre Evener, MD [facility]            Signed Prescriptions Disp Refills   atorvastatin (LIPITOR) 10 MG tablet 100 tablet 2    Sig: TAKE 1 TABLET(10 MG) BY MOUTH DAILY     Cardiovascular:  Antilipid - Statins Failed - 06/19/2022  9:59 AM      Failed - Lipid  Panel in normal range within the last 12 months    Cholesterol, Total  Date Value Ref Range Status  05/20/2022 186 100 - 199 mg/dL Final   Cholesterol [place], [place]  Date Value Ref Range Status  07/28/2018 159 <200 mg/dL Final    Comment:                            Desirable                <200                         Borderline High      200- 239                         High                     >239    LDL Chol Calc (NIH)  Date Value Ref Range Status  05/20/2022 105 (H) 0 - 99 mg/dL Final   HDL  Date Value Ref Range Status  05/20/2022 47 >39 mg/dL Final   Triglycerides  Date Value Ref Range Status  05/20/2022 199 (H) 0 - 149 mg/dL Final   Triglycerides Piccolo,Waived  Date Value Ref Range Status  07/28/2018 308 (H) <150 mg/dL Final    Comment:                            Normal                   <150                         Borderline High     150 - 199                         High                200 - 499                         Very High                >499          Passed - Patient is not pregnant      Passed - Valid encounter within last 12 months    Recent Outpatient Visits           1 month ago Poorly controlled type 2 diabetes  mellitus (HCC)   [place] [facility] [place], [place] T, NP   3 months ago Subacute cough   [place] [facility] [place], [place] T, NP   3 months ago Influenza A   [place] [facility] [place], [place] T, NP   3 months ago Poorly controlled type 2 diabetes mellitus (HCC)   [place] [facility] [place], Dorie Rank, NP   4 months ago Influenza A   [place] [place], Oralia Rud, DO       Future Appointments             In 2 months Cannady, Dorie Rank, NP [place] [facility]   In 10 months Deirdre Evener, MD [facility]             metFORMIN (GLUCOPHAGE) 1000 MG tablet 200 tablet 2    Sig: TAKE 1 TABLET(1000 MG) BY MOUTH TWICE DAILY WITH A MEAL     Endocrinology:  Diabetes - Biguanides Passed - 06/19/2022  9:59 AM      Passed - Cr in normal range and within 360 days    Creatinine  Date Value Ref Range Status  06/09/2014 0.73 mg/dL Final    Comment:    1.61-0.96 NOTE: New Reference Range  04/26/14    Creatinine, Ser  Date Value Ref Range Status  05/20/2022 0.63 0.57 - 1.00 mg/dL Final         Passed - HBA1C is between 0 and 7.9 and within 180 days    Hemoglobin A1C  Date Value Ref Range Status  10/17/2015 7.4%  Final   HB A1C (BAYER DCA - WAIVED)  Date Value Ref Range Status  05/20/2022 6.8 (H) 4.8 - 5.6 % Final    Comment:             Prediabetes: 5.7 - 6.4          Diabetes: >6.4          Glycemic control for adults with diabetes: <7.0          Passed - eGFR in normal range and within 360 days    EGFR (African American)  Date Value Ref Range Status  06/09/2014 >60  Final   GFR calc Af Amer  Date Value Ref Range Status  03/28/2020 103 >59 mL/min/1.73 Final    Comment:    **In accordance with recommendations from the NKF-ASN Task force,**   [facility] is in the process of updating its eGFR calculation to the   2021 CKD-EPI  creatinine equation that estimates kidney function   without a race variable.    EGFR (Non-African Amer.)  Date Value Ref Range Status  06/09/2014 >60  Final    Comment:    eGFR values <54mL/min/1.73 m2 may be an indication of chronic kidney disease (CKD). Calculated eGFR is useful in patients with stable renal function. The eGFR calculation will not be reliable in acutely ill patients when serum creatinine is changing rapidly. It is not useful in patients on dialysis. The eGFR calculation may not be applicable to patients at the low and high extremes of body sizes, pregnant women, and vegetarians.    GFR, Estimated  Date Value Ref Range Status  02/12/2022 >60 >60 mL/min Final    Comment:    (NOTE) Calculated using the CKD-EPI Creatinine Equation (2021)    eGFR  Date Value Ref Range Status  05/20/2022 95 >59 mL/min/1.73 Final         Passed - B12 Level in normal range and within 720 days    Vitamin B-12  Date Value Ref Range Status  02/25/2022 1,074 232 - 1,245 pg/mL Final         Passed - Valid encounter within last 6 months    Recent Outpatient Visits           1 month ago Poorly controlled type 2 diabetes mellitus (HCC)   [place] [facility] [place], [place] T, NP   3 months ago Subacute cough   [place] [facility] [place], [place] T, NP   3 months ago Influenza A   [facility] [place], Ali Chukson T, NP   3 months ago Poorly controlled type  2 diabetes mellitus (HCC)   [place] [facility] [place], [place] T, NP   4 months ago Influenza A   [facility] [place], [place], DO       Future Appointments             In 2 months Cannady, Dorie Rank, NP [place] [facility]   In 10 months Deirdre Evener, MD [place] [facility]            Passed - CBC within normal limits and completed in the last 12 months    WBC  Date Value Ref  Range Status  02/25/2022 5.8 3.4 - 10.8 x10E3/uL Final  02/12/2022 4.4 4.0 - 10.5 K/uL Final   RBC  Date Value Ref Range Status  02/25/2022 4.23 3.77 - 5.28 x10E6/uL Final  02/12/2022 4.55 3.87 - 5.11 MIL/uL Final   Hemoglobin  Date Value Ref Range Status  02/25/2022 12.6 11.1 - 15.9 g/dL Final   Hematocrit  Date Value Ref Range Status  02/25/2022 37.3 34.0 - 46.6 % Final   MCHC  Date Value Ref Range Status  02/25/2022 33.8 31.5 - 35.7 g/dL Final  16/11/9602 54.0 30.0 - 36.0 g/dL Final   [facility]  Date Value Ref Range Status  02/25/2022 29.8 26.6 - 33.0 pg Final  02/12/2022 29.5 26.0 - 34.0 pg Final   MCV  Date Value Ref Range Status  02/25/2022 88 79 - 97 fL Final  06/09/2014 88 80 - 100 fL Final   No results found for: "PLTCOUNTKUC", "LABPLAT", "POCPLA" RDW  Date Value Ref Range Status  02/25/2022 12.2 11.7 - 15.4 % Final  06/09/2014 12.6 11.5 - 14.5 % Final          benazepril-hydrochlorthiazide (LOTENSIN HCT) 20-25 MG tablet 100 tablet 3    Sig: Take 1 tablet by mouth daily.     Cardiovascular:  ACEI + Diuretic Combos Passed - 06/19/2022  9:59 AM      Passed - Na in normal range and within 180 days    Sodium  Date Value Ref Range Status  05/20/2022 139 134 - 144 mmol/L Final  06/09/2014 136 mmol/L Final    Comment:    135-145 NOTE: New Reference Range  04/26/14          Passed - K in normal range and within 180 days    Potassium  Date Value Ref Range Status  05/20/2022 4.1 3.5 - 5.2 mmol/L Final  06/09/2014 3.3 (L) mmol/L Final    Comment:    3.5-5.1 NOTE: New Reference Range  04/26/14          Passed - Cr in normal range and within 180 days    Creatinine  Date Value Ref Range Status  06/09/2014 0.73 mg/dL Final    Comment:    9.81-1.91 NOTE: New Reference Range  04/26/14    Creatinine, Ser  Date Value Ref Range Status  05/20/2022 0.63 0.57 - 1.00 mg/dL Final         Passed - eGFR is 30 or above and within 180 days    EGFR (African  American)  Date Value Ref Range Status  06/09/2014 >60  Final   GFR calc Af Amer  Date Value Ref Range Status  03/28/2020 103 >59 mL/min/1.73 Final    Comment:    **In accordance with recommendations from the NKF-ASN Task force,**   [facility] is in the process of updating its eGFR calculation  to the   2021 CKD-EPI creatinine equation that estimates kidney function   without a race variable.    EGFR (Non-African Amer.)  Date Value Ref Range Status  06/09/2014 >60  Final    Comment:    eGFR values <51mL/min/1.73 m2 may be an indication of chronic kidney disease (CKD). Calculated eGFR is useful in patients with stable renal function. The eGFR calculation will not be reliable in acutely ill patients when serum creatinine is changing rapidly. It is not useful in patients on dialysis. The eGFR calculation may not be applicable to patients at the low and high extremes of body sizes, pregnant women, and vegetarians.    GFR, Estimated  Date Value Ref Range Status  02/12/2022 >60 >60 mL/min Final    Comment:    (NOTE) Calculated using the CKD-EPI Creatinine Equation (2021)    eGFR  Date Value Ref Range Status  05/20/2022 95 >59 mL/min/1.73 Final         Passed - Patient is not pregnant      Passed - Last BP in normal range    BP Readings from Last 1 Encounters:  05/20/22 114/72         Passed - Valid encounter within last 6 months    Recent Outpatient Visits           1 month ago Poorly controlled type 2 diabetes mellitus (HCC)   [place] [facility] [place], [place] T, NP   3 months ago Subacute cough   [place] [facility] [place], Carroll T, NP   3 months ago Influenza A   [facility] [place], [place] T, NP   3 months ago Poorly controlled type 2 diabetes mellitus (HCC)   [place] [facility] [place], Dorie Rank, NP   4 months ago Influenza A   [place] [facility] [place], Oralia Rud,  DO       Future Appointments             In 2 months Cannady, Dorie Rank, NP [facility]   In 10 months Deirdre Evener, MD [place] [facility]               Requested Prescriptions  Pending Prescriptions Disp Refills   omeprazole (PRILOSEC) 20 MG capsule      Sig: Take 2 capsules (40 mg total) by mouth daily.     Gastroenterology: Proton Pump Inhibitors Passed - 06/19/2022  9:59 AM      Passed - Valid encounter within last 12 months    Recent Outpatient Visits           1 month ago Poorly controlled type 2 diabetes mellitus (HCC)   [place] [facility] [place], [place] T, NP   3 months ago Subacute cough   [place] [facility] [place], [place] T, NP   3 months ago Influenza A   [place] [facility] [place], [place] T, NP   3 months ago Poorly controlled type 2 diabetes mellitus (HCC)   [place] [facility] [place], Dorie Rank, NP   4 months ago Influenza A   [place] [facility] [place], Oralia Rud, DO       Future Appointments             In 2 months Cannady, Dorie Rank, NP  [facility]   In 10 months Armida Sans  C, MD [facility]             Semaglutide (RYBELSUS) 7 MG TABS 30 tablet 12    Sig: Take 1 tablet (7 mg total) by mouth daily.     Off-Protocol Failed - 06/19/2022  9:59 AM      Failed - Medication not assigned to a protocol, review manually.      Passed - Valid encounter within last 12 months    Recent Outpatient Visits           1 month ago Poorly controlled type 2 diabetes mellitus (HCC)   [place] [facility] [place], [place] T, NP   3 months ago Subacute cough   [place] [facility] [place], [place] T, NP   3 months ago Influenza A   [place] [place], [place] T, NP   3 months ago Poorly controlled type 2 diabetes  mellitus (HCC)   [place] [facility] [place], Dorie Rank, NP   4 months ago Influenza A   [place] [facility] [facility] [place], [place], DO       Future Appointments             In 2 months Cannady, Dorie Rank, NP [facility]   In 10 months Deirdre Evener, MD [place] [facility]            Signed Prescriptions Disp Refills   atorvastatin (LIPITOR) 10 MG tablet 100 tablet 2    Sig: TAKE 1 TABLET(10 MG) BY MOUTH DAILY     Cardiovascular:  Antilipid - Statins Failed - 06/19/2022  9:59 AM      Failed - Lipid Panel in normal range within the last 12 months    Cholesterol, Total  Date Value Ref Range Status  05/20/2022 186 100 - 199 mg/dL Final   Cholesterol Piccolo, Waived  Date Value Ref Range Status  07/28/2018 159 <200 mg/dL Final    Comment:                            Desirable                <200                         Borderline High      200- 239                         High                     >239    LDL Chol Calc (NIH)  Date Value Ref Range Status  05/20/2022 105 (H) 0 - 99 mg/dL Final   HDL  Date Value Ref Range Status  05/20/2022 47 >39 mg/dL Final   Triglycerides  Date Value Ref Range Status  05/20/2022 199 (H) 0 - 149 mg/dL Final   Triglycerides Piccolo,Waived  Date Value Ref Range Status  07/28/2018 308 (H) <150 mg/dL Final    Comment:                            Normal                   <150  Borderline High     150 - 199                         High                200 - 499                         Very High                >499          Passed - Patient is not pregnant      Passed - Valid encounter within last 12 months    Recent Outpatient Visits           1 month ago Poorly controlled type 2 diabetes mellitus (HCC)   [place] [facility] [place], [place] T, NP   3 months ago Subacute cough   [place] [facility]  [place], [place] T, NP   3 months ago Influenza A   [place] [facility] [place], [place] T, NP   3 months ago Poorly controlled type 2 diabetes mellitus (HCC)   [place] [facility] [place], [place] T, NP   4 months ago Influenza A   [place] [place], [place], DO       Future Appointments             In 2 months Cannady, Dorie Rank, NP [place] [facility]   In 10 months Deirdre Evener, MD [place] [facility]             metFORMIN (GLUCOPHAGE) 1000 MG tablet 200 tablet 2    Sig: TAKE 1 TABLET(1000 MG) BY MOUTH TWICE DAILY WITH A MEAL     Endocrinology:  Diabetes - Biguanides Passed - 06/19/2022  9:59 AM      Passed - Cr in normal range and within 360 days    Creatinine  Date Value Ref Range Status  06/09/2014 0.73 mg/dL Final    Comment:    1.61-0.96 NOTE: New Reference Range  04/26/14    Creatinine, Ser  Date Value Ref Range Status  05/20/2022 0.63 0.57 - 1.00 mg/dL Final         Passed - HBA1C is between 0 and 7.9 and within 180 days    Hemoglobin A1C  Date Value Ref Range Status  10/17/2015 7.4%  Final   HB A1C (BAYER DCA - WAIVED)  Date Value Ref Range Status  05/20/2022 6.8 (H) 4.8 - 5.6 % Final    Comment:             Prediabetes: 5.7 - 6.4          Diabetes: >6.4          Glycemic control for adults with diabetes: <7.0          Passed - eGFR in normal range and within 360 days    EGFR (African American)  Date Value Ref Range Status  06/09/2014 >60  Final   GFR calc Af Amer  Date Value Ref Range Status  03/28/2020 103 >59 mL/min/1.73 Final    Comment:    **In accordance with recommendations from the NKF-ASN Task force,**   [facility] is in the process of updating its eGFR calculation to the   2021 CKD-EPI creatinine equation that estimates kidney function   without  a race variable.    EGFR (Non-African Amer.)  Date Value Ref Range Status  06/09/2014  >60  Final    Comment:    eGFR values <96mL/min/1.73 m2 may be an indication of chronic kidney disease (CKD). Calculated eGFR is useful in patients with stable renal function. The eGFR calculation will not be reliable in acutely ill patients when serum creatinine is changing rapidly. It is not useful in patients on dialysis. The eGFR calculation may not be applicable to patients at the low and high extremes of body sizes, pregnant women, and vegetarians.    GFR, Estimated  Date Value Ref Range Status  02/12/2022 >60 >60 mL/min Final    Comment:    (NOTE) Calculated using the CKD-EPI Creatinine Equation (2021)    eGFR  Date Value Ref Range Status  05/20/2022 95 >59 mL/min/1.73 Final         Passed - B12 Level in normal range and within 720 days    Vitamin B-12  Date Value Ref Range Status  02/25/2022 1,074 232 - 1,245 pg/mL Final         Passed - Valid encounter within last 6 months    Recent Outpatient Visits           1 month ago Poorly controlled type 2 diabetes mellitus (HCC)   [place] [facility] [place], [place] T, NP   3 months ago Subacute cough   [place] [facility] [place], [place] T, NP   3 months ago Influenza A   [place] [facility] [employer], [place] T, NP   3 months ago Poorly controlled type 2 diabetes mellitus (HCC)   [place] [facility] [place], [place] T, NP   4 months ago Influenza A   [facility] [place], [place], DO       Future Appointments             In 2 months Cannady, Dorie Rank, NP [facility]   In 10 months Deirdre Evener, MD [place] [facility]            Passed - CBC within normal limits and completed in the last 12 months    WBC  Date Value Ref Range Status  02/25/2022 5.8 3.4 - 10.8 x10E3/uL Final  02/12/2022 4.4 4.0 - 10.5 K/uL Final   RBC  Date Value Ref Range Status  02/25/2022 4.23 3.77  - 5.28 x10E6/uL Final  02/12/2022 4.55 3.87 - 5.11 MIL/uL Final   Hemoglobin  Date Value Ref Range Status  02/25/2022 12.6 11.1 - 15.9 g/dL Final   Hematocrit  Date Value Ref Range Status  02/25/2022 37.3 34.0 - 46.6 % Final   MCHC  Date Value Ref Range Status  02/25/2022 33.8 31.5 - 35.7 g/dL Final  91/47/8295 62.1 30.0 - 36.0 g/dL Final   [facility]  Date Value Ref Range Status  02/25/2022 29.8 26.6 - 33.0 pg Final  02/12/2022 29.5 26.0 - 34.0 pg Final   MCV  Date Value Ref Range Status  02/25/2022 88 79 - 97 fL Final  06/09/2014 88 80 - 100 fL Final   No results found for: "PLTCOUNTKUC", "LABPLAT", "POCPLA" RDW  Date Value Ref Range Status  02/25/2022 12.2 11.7 - 15.4 % Final  06/09/2014 12.6 11.5 - 14.5 % Final          benazepril-hydrochlorthiazide (LOTENSIN HCT) 20-25 MG tablet 100 tablet 3    Sig:  Take 1 tablet by mouth daily.     Cardiovascular:  ACEI + Diuretic Combos Passed - 06/19/2022  9:59 AM      Passed - Na in normal range and within 180 days    Sodium  Date Value Ref Range Status  05/20/2022 139 134 - 144 mmol/L Final  06/09/2014 136 mmol/L Final    Comment:    135-145 NOTE: New Reference Range  04/26/14          Passed - K in normal range and within 180 days    Potassium  Date Value Ref Range Status  05/20/2022 4.1 3.5 - 5.2 mmol/L Final  06/09/2014 3.3 (L) mmol/L Final    Comment:    3.5-5.1 NOTE: New Reference Range  04/26/14          Passed - Cr in normal range and within 180 days    Creatinine  Date Value Ref Range Status  06/09/2014 0.73 mg/dL Final    Comment:    4.09-8.11 NOTE: New Reference Range  04/26/14    Creatinine, Ser  Date Value Ref Range Status  05/20/2022 0.63 0.57 - 1.00 mg/dL Final         Passed - eGFR is 30 or above and within 180 days    EGFR (African American)  Date Value Ref Range Status  06/09/2014 >60  Final   GFR calc Af Amer  Date Value Ref Range Status  03/28/2020 103 >59 mL/min/1.73 Final     Comment:    **In accordance with recommendations from the NKF-ASN Task force,**   [facility] is in the process of updating its eGFR calculation to the   2021 CKD-EPI creatinine equation that estimates kidney function   without a race variable.    EGFR (Non-African Amer.)  Date Value Ref Range Status  06/09/2014 >60  Final    Comment:    eGFR values <12mL/min/1.73 m2 may be an indication of chronic kidney disease (CKD). Calculated eGFR is useful in patients with stable renal function. The eGFR calculation will not be reliable in acutely ill patients when serum creatinine is changing rapidly. It is not useful in patients on dialysis. The eGFR calculation may not be applicable to patients at the low and high extremes of body sizes, pregnant women, and vegetarians.    GFR, Estimated  Date Value Ref Range Status  02/12/2022 >60 >60 mL/min Final    Comment:    (NOTE) Calculated using the CKD-EPI Creatinine Equation (2021)    eGFR  Date Value Ref Range Status  05/20/2022 95 >59 mL/min/1.73 Final         Passed - Patient is not pregnant      Passed - Last BP in normal range    BP Readings from Last 1 Encounters:  05/20/22 114/72         Passed - Valid encounter within last 6 months    Recent Outpatient Visits           1 month ago Poorly controlled type 2 diabetes mellitus (HCC)   [place] [facility] [place], [place] T, NP   3 months ago Subacute cough   [facility] [place], Stella T, NP   3 months ago Influenza A   [place] [facility] [place], [place] T, NP   3 months ago Poorly controlled type 2 diabetes mellitus (HCC)    [facility] [place], Corrie Dandy T, NP   4 months ago Influenza A  [facility] [place], Oralia Rud, DO       Future Appointments             In 2 months Cannady, Dorie Rank, NP [facility] [facility]   In 10 months Deirdre Evener, MD [facility]  [facility]

## 2022-06-19 NOTE — Telephone Encounter (Signed)
Medication Refill - Medication: Omeperazole 40 mg/ metformin/ 1000mg   Rybelsus/ 7mg  ATORVASTin 10mg   benazepril-hydrochlorthiazide / 10 mg (LOTENSIN HCT) 20-25 MG tablet  Pt is requesting a 100 day supply of these meds  Has the patient contacted their pharmacy? yes (Agent: If no, request that the patient contact the pharmacy for the refill. If patient does not wish to contact the pharmacy document the reason why and proceed with request.) (Agent: If yes, when and what did the pharmacy advise?)contact pcp  Preferred Pharmacy (with phone number or street name): benazepril-hydrochlorthiazide (LOTENSIN HCT) 20-25 MG tablet  Has the patient been seen for an appointment in the last year OR does the patient have an upcoming appointment? yes  Agent: Please be advised that RX refills may take up to 3 business days. We ask that you follow-up with your pharmacy.

## 2022-06-19 NOTE — Telephone Encounter (Signed)
Notes to clinic: Duplicate request- see previous  Requested Prescriptions  Pending Prescriptions Disp Refills   omeprazole (PRILOSEC) 20 MG capsule      Sig: Take 2 capsules (40 mg total) by mouth daily.     Gastroenterology: Proton Pump Inhibitors Passed - 06/19/2022  9:59 AM      Passed - Valid encounter within last 12 months    Recent Outpatient Visits           1 month ago Poorly controlled type 2 diabetes mellitus (HCC)   [place] [facility] [place], Sebew[place] T, NP   3 months ago Subacute cough   [place] [facility] [place], [place] T, NP   3 months ago Influenza A   [place] [facility] [place], [place] T, NP   3 months ago Poorly controlled type 2 diabetes mellitus (HCC)   [place] [facility] [place], Dorie Rank, NP   4 months ago Influenza A   [place] [facility] Dorcas Carrow, DO       Future Appointments             In 2 months Marjie Skiff, NP [place] [facility]   In 10 months Deirdre Evener, MD [place] [facility]             Semaglutide (RYBELSUS) 7 MG TABS 30 tablet 12    Sig: Take 1 tablet (7 mg total) by mouth daily.     Off-Protocol Failed - 06/19/2022  9:59 AM      Failed - Medication not assigned to a protocol, review manually.      Passed - Valid encounter within last 12 months    Recent Outpatient Visits           1 month ago Poorly controlled type 2 diabetes mellitus (HCC)   [place] [facility] [place], Roland T, NP   3 months ago Subacute cough   [place] [facility] [place], [place] T, NP   3 months ago Influenza A   [place] [facility] [place], [place] T, NP   3 months ago Poorly controlled type 2 diabetes mellitus (HCC)   [place] [facility] [place], Corrie Dandy T, NP   4 months ago Influenza A   [place] [facility] [place], Oralia Rud, DO        Future Appointments             In 2 months Cannady, Dorie Rank, NP [place] [facility], [facility]   In 10 months Deirdre Evener, MD [place] [facility]            Signed Prescriptions Disp Refills   atorvastatin (LIPITOR) 10 MG tablet 100 tablet 2    Sig: TAKE 1 TABLET(10 MG) BY MOUTH DAILY     Cardiovascular:  Antilipid - Statins Failed - 06/19/2022  9:59 AM      Failed - Lipid Panel in normal range within the last 12 months    Cholesterol, Total  Date Value Ref Range Status  05/20/2022 186 100 - 199 mg/dL Final   Cholesterol Piccolo, Waived  Date Value Ref Range Status  07/28/2018 159 <200 mg/dL Final    Comment:                            Desirable                <  200                         Borderline High      200- 239                         High                     >239    LDL Chol Calc (NIH)  Date Value Ref Range Status  05/20/2022 105 (H) 0 - 99 mg/dL Final   HDL  Date Value Ref Range Status  05/20/2022 47 >39 mg/dL Final   Triglycerides  Date Value Ref Range Status  05/20/2022 199 (H) 0 - 149 mg/dL Final   Triglycerides Piccolo,Waived  Date Value Ref Range Status  07/28/2018 308 (H) <150 mg/dL Final    Comment:                            Normal                   <150                         Borderline High     150 - 199                         High                200 - 499                         Very High                >499          Passed - Patient is not pregnant      Passed - Valid encounter within last 12 months    Recent Outpatient Visits           1 month ago Poorly controlled type 2 diabetes mellitus (HCC)   [place] [facility] [place], [place] T, NP   3 months ago Subacute cough   [place] [facility] [place], [place] T, NP   3 months ago Influenza A   [place] [facility] Lely, Upland T, NP   3 months ago Poorly controlled type 2 diabetes mellitus  (HCC)   [place] [facility] [place], Corrie Dandy T, NP   4 months ago Influenza A   [place] [facility] Baker, Oralia Rud, DO       Future Appointments             In 2 months Cannady, Dorie Rank, NP [facility]   In 10 months Deirdre Evener, MD [place] [facility]             metFORMIN (GLUCOPHAGE) 1000 MG tablet 200 tablet 2    Sig: TAKE 1 TABLET(1000 MG) BY MOUTH TWICE DAILY WITH A MEAL     Endocrinology:  Diabetes - Biguanides Passed - 06/19/2022  9:59 AM      Passed - Cr in normal range and within 360 days    Creatinine  Date Value Ref Range Status  06/09/2014 0.73 mg/dL Final    Comment:    4.54-0.98 NOTE:  New Reference Range  04/26/14    Creatinine, Ser  Date Value Ref Range Status  05/20/2022 0.63 0.57 - 1.00 mg/dL Final         Passed - HBA1C is between 0 and 7.9 and within 180 days    Hemoglobin A1C  Date Value Ref Range Status  10/17/2015 7.4%  Final   HB A1C (BAYER DCA - WAIVED)  Date Value Ref Range Status  05/20/2022 6.8 (H) 4.8 - 5.6 % Final    Comment:             Prediabetes: 5.7 - 6.4          Diabetes: >6.4          Glycemic control for adults with diabetes: <7.0          Passed - eGFR in normal range and within 360 days    EGFR (African American)  Date Value Ref Range Status  06/09/2014 >60  Final   GFR calc Af Amer  Date Value Ref Range Status  03/28/2020 103 >59 mL/min/1.73 Final    Comment:    **In accordance with recommendations from the NKF-ASN Task force,**   [facility] is in the process of updating its eGFR calculation to the   2021 CKD-EPI creatinine equation that estimates kidney function   without a race variable.    EGFR (Non-African Amer.)  Date Value Ref Range Status  06/09/2014 >60  Final    Comment:    eGFR values <84mL/min/1.73 m2 may be an indication of chronic kidney disease (CKD). Calculated eGFR is useful in patients with stable renal  function. The eGFR calculation will not be reliable in acutely ill patients when serum creatinine is changing rapidly. It is not useful in patients on dialysis. The eGFR calculation may not be applicable to patients at the low and high extremes of body sizes, pregnant women, and vegetarians.    GFR, Estimated  Date Value Ref Range Status  02/12/2022 >60 >60 mL/min Final    Comment:    (NOTE) Calculated using the CKD-EPI Creatinine Equation (2021)    eGFR  Date Value Ref Range Status  05/20/2022 95 >59 mL/min/1.73 Final         Passed - B12 Level in normal range and within 720 days    Vitamin B-12  Date Value Ref Range Status  02/25/2022 1,074 232 - 1,245 pg/mL Final         Passed - Valid encounter within last 6 months    Recent Outpatient Visits           1 month ago Poorly controlled type 2 diabetes mellitus (HCC)   [place] [facility] [place], Shepherd T, NP   3 months ago Subacute cough   [place] [facility] Hernandez, [place] T, NP   3 months ago Influenza A   [place] [facility] [place], [place]uma T, NP   3 months ago Poorly controlled type 2 diabetes mellitus (HCC)   [place] [facility] [place], Dorie Rank, NP   4 months ago Influenza A   [place] [facility] [place], Oralia Rud, DO       Future Appointments             In 2 months Cannady, Dorie Rank, NP [place] [facility]   In 10 months Deirdre Evener, MD [facility]            Passed -  CBC within normal limits and completed in the last 12 months    WBC  Date Value Ref Range Status  02/25/2022 5.8 3.4 - 10.8 x10E3/uL Final  02/12/2022 4.4 4.0 - 10.5 K/uL Final   RBC  Date Value Ref Range Status  02/25/2022 4.23 3.77 - 5.28 x10E6/uL Final  02/12/2022 4.55 3.87 - 5.11 MIL/uL Final   Hemoglobin  Date Value Ref Range Status  02/25/2022 12.6 11.1 - 15.9 g/dL Final   Hematocrit   Date Value Ref Range Status  02/25/2022 37.3 34.0 - 46.6 % Final   MCHC  Date Value Ref Range Status  02/25/2022 33.8 31.5 - 35.7 g/dL Final  40/98/1191 47.8 30.0 - 36.0 g/dL Final   [facility]  Date Value Ref Range Status  02/25/2022 29.8 26.6 - 33.0 pg Final  02/12/2022 29.5 26.0 - 34.0 pg Final   MCV  Date Value Ref Range Status  02/25/2022 88 79 - 97 fL Final  06/09/2014 88 80 - 100 fL Final   No results found for: "PLTCOUNTKUC", "LABPLAT", "POCPLA" RDW  Date Value Ref Range Status  02/25/2022 12.2 11.7 - 15.4 % Final  06/09/2014 12.6 11.5 - 14.5 % Final          benazepril-hydrochlorthiazide (LOTENSIN HCT) 20-25 MG tablet 100 tablet 3    Sig: Take 1 tablet by mouth daily.     Cardiovascular:  ACEI + Diuretic Combos Passed - 06/19/2022  9:59 AM      Passed - Na in normal range and within 180 days    Sodium  Date Value Ref Range Status  05/20/2022 139 134 - 144 mmol/L Final  06/09/2014 136 mmol/L Final    Comment:    135-145 NOTE: New Reference Range  04/26/14          Passed - K in normal range and within 180 days    Potassium  Date Value Ref Range Status  05/20/2022 4.1 3.5 - 5.2 mmol/L Final  06/09/2014 3.3 (L) mmol/L Final    Comment:    3.5-5.1 NOTE: New Reference Range  04/26/14          Passed - Cr in normal range and within 180 days    Creatinine  Date Value Ref Range Status  06/09/2014 0.73 mg/dL Final    Comment:    2.95-6.21 NOTE: New Reference Range  04/26/14    Creatinine, Ser  Date Value Ref Range Status  05/20/2022 0.63 0.57 - 1.00 mg/dL Final         Passed - eGFR is 30 or above and within 180 days    EGFR (African American)  Date Value Ref Range Status  06/09/2014 >60  Final   GFR calc Af Amer  Date Value Ref Range Status  03/28/2020 103 >59 mL/min/1.73 Final    Comment:    **In accordance with recommendations from the NKF-ASN Task force,**   [facility] is in the process of updating its eGFR calculation to the   2021 CKD-EPI  creatinine equation that estimates kidney function   without a race variable.    EGFR (Non-African Amer.)  Date Value Ref Range Status  06/09/2014 >60  Final    Comment:    eGFR values <82mL/min/1.73 m2 may be an indication of chronic kidney disease (CKD). Calculated eGFR is useful in patients with stable renal function. The eGFR calculation will not be reliable in acutely ill patients when serum creatinine is changing rapidly. It is not useful in patients on dialysis. The eGFR calculation  may not be applicable to patients at the low and high extremes of body sizes, pregnant women, and vegetarians.    GFR, Estimated  Date Value Ref Range Status  02/12/2022 >60 >60 mL/min Final    Comment:    (NOTE) Calculated using the CKD-EPI Creatinine Equation (2021)    eGFR  Date Value Ref Range Status  05/20/2022 95 >59 mL/min/1.73 Final         Passed - Patient is not pregnant      Passed - Last BP in normal range    BP Readings from Last 1 Encounters:  05/20/22 114/72         Passed - Valid encounter within last 6 months    Recent Outpatient Visits           1 month ago Poorly controlled type 2 diabetes mellitus (HCC)   [place] [facility] [place], [place] T, NP   3 months ago Subacute cough   [place] [facility] [place], [place] T, NP   3 months ago Influenza A   [place] [facility] Pink, [place] T, NP   3 months ago Poorly controlled type 2 diabetes mellitus (HCC)   [place] [facility] [place], Dorie Rank, NP   4 months ago Influenza A   [place] [facility] [place], Oralia Rud, DO       Future Appointments             In 2 months Cannady, Dorie Rank, NP [place] [facility]   In 10 months Deirdre Evener, MD [place] [facility]               Requested Prescriptions  Pending Prescriptions Disp Refills   omeprazole (PRILOSEC) 20 MG capsule       Sig: Take 2 capsules (40 mg total) by mouth daily.     Gastroenterology: Proton Pump Inhibitors Passed - 06/19/2022  9:59 AM      Passed - Valid encounter within last 12 months    Recent Outpatient Visits           1 month ago Poorly controlled type 2 diabetes mellitus (HCC)   [place] [facility] [place], [place] T, NP   3 months ago Subacute cough   [place] [facility] [place], Ripley T, NP   3 months ago Influenza A   [place] [facility] [place], [place] T, NP   3 months ago Poorly controlled type 2 diabetes mellitus (HCC)   [place] [facility] [place], Dorie Rank, NP   4 months ago Influenza A   [place] [facility] Dorcas Carrow, DO       Future Appointments             In 2 months Marjie Skiff, NP [place] [facility]   In 10 months Deirdre Evener, MD  [place]             Semaglutide (RYBELSUS) 7 MG TABS 30 tablet 12    Sig: Take 1 tablet (7 mg total) by mouth daily.     Off-Protocol Failed - 06/19/2022  9:59 AM      Failed - Medication not assigned to a protocol, review manually.      Passed - Valid encounter within last 12 months    Recent Outpatient Visits  1 month ago Poorly controlled type 2 diabetes mellitus (HCC)   [place] [facility] [place], Troy T, NP   3 months ago Subacute cough   [place] [facility] [place], Rosalia T, NP   3 months ago Influenza A   [place] [facility] [place], [place] T, NP   3 months ago Poorly controlled type 2 diabetes mellitus (HCC)   [place] [facility] [place], Perry T, NP   4 months ago Influenza A   [place] [facility] [place], [place], DO       Future Appointments             In 2 months Cannady, Dorie Rank, NP  [facility]   In 10 months Deirdre Evener, MD   [facility]            Signed Prescriptions Disp Refills   atorvastatin (LIPITOR) 10 MG tablet 100 tablet 2    Sig: TAKE 1 TABLET(10 MG) BY MOUTH DAILY     Cardiovascular:  Antilipid - Statins Failed - 06/19/2022  9:59 AM      Failed - Lipid Panel in normal range within the last 12 months    Cholesterol, Total  Date Value Ref Range Status  05/20/2022 186 100 - 199 mg/dL Final   Cholesterol Piccolo, Waived  Date Value Ref Range Status  07/28/2018 159 <200 mg/dL Final    Comment:                            Desirable                <200                         Borderline High      200- 239                         High                     >239    LDL Chol Calc (NIH)  Date Value Ref Range Status  05/20/2022 105 (H) 0 - 99 mg/dL Final   HDL  Date Value Ref Range Status  05/20/2022 47 >39 mg/dL Final   Triglycerides  Date Value Ref Range Status  05/20/2022 199 (H) 0 - 149 mg/dL Final   Triglycerides Piccolo,Waived  Date Value Ref Range Status  07/28/2018 308 (H) <150 mg/dL Final    Comment:                            Normal                   <150                         Borderline High     150 - 199                         High                200 - 499  Very High                >499          Passed - Patient is not pregnant      Passed - Valid encounter within last 12 months    Recent Outpatient Visits           1 month ago Poorly controlled type 2 diabetes mellitus (HCC)   [place] [facility] [place], W[place] T, NP   3 months ago Subacute cough   [place] [facility] [place], [place] T, NP   3 months ago Influenza A   [place] [facility] [place], [place] T, NP   3 months ago Poorly controlled type 2 diabetes mellitus (HCC)   [place] [facility] [place], [place] T, NP   4 months ago Influenza A   [place] [facility] Hickman, [place], DO        Future Appointments             In 2 months Cannady, Dorie Rank, NP [place] [facility]   In 10 months Deirdre Evener, MD [place] [facility]             metFORMIN (GLUCOPHAGE) 1000 MG tablet 200 tablet 2    Sig: TAKE 1 TABLET(1000 MG) BY MOUTH TWICE DAILY WITH A MEAL     Endocrinology:  Diabetes - Biguanides Passed - 06/19/2022  9:59 AM      Passed - Cr in normal range and within 360 days    Creatinine  Date Value Ref Range Status  06/09/2014 0.73 mg/dL Final    Comment:    1.61-0.96 NOTE: New Reference Range  04/26/14    Creatinine, Ser  Date Value Ref Range Status  05/20/2022 0.63 0.57 - 1.00 mg/dL Final         Passed - HBA1C is between 0 and 7.9 and within 180 days    Hemoglobin A1C  Date Value Ref Range Status  10/17/2015 7.4%  Final   HB A1C (BAYER DCA - WAIVED)  Date Value Ref Range Status  05/20/2022 6.8 (H) 4.8 - 5.6 % Final    Comment:             Prediabetes: 5.7 - 6.4          Diabetes: >6.4          Glycemic control for adults with diabetes: <7.0          Passed - eGFR in normal range and within 360 days    EGFR (African American)  Date Value Ref Range Status  06/09/2014 >60  Final   GFR calc Af Amer  Date Value Ref Range Status  03/28/2020 103 >59 mL/min/1.73 Final    Comment:    **In accordance with recommendations from the NKF-ASN Task force,**   [facility] is in the process of updating its eGFR calculation to the   2021 CKD-EPI creatinine equation that estimates kidney function   without a race variable.    EGFR (Non-African Amer.)  Date Value Ref Range Status  06/09/2014 >60  Final    Comment:    eGFR values <42mL/min/1.73 m2 may be an indication of chronic kidney disease (CKD). Calculated eGFR is useful in patients with stable renal function. The eGFR calculation will not be reliable in acutely ill patients when serum creatinine is changing rapidly. It is not useful in patients on  dialysis.  The eGFR calculation may not be applicable to patients at the low and high extremes of body sizes, pregnant women, and vegetarians.    GFR, Estimated  Date Value Ref Range Status  02/12/2022 >60 >60 mL/min Final    Comment:    (NOTE) Calculated using the CKD-EPI Creatinine Equation (2021)    eGFR  Date Value Ref Range Status  05/20/2022 95 >59 mL/min/1.73 Final         Passed - B12 Level in normal range and within 720 days    Vitamin B-12  Date Value Ref Range Status  02/25/2022 1,074 232 - 1,245 pg/mL Final         Passed - Valid encounter within last 6 months    Recent Outpatient Visits           1 month ago Poorly controlled type 2 diabetes mellitus (HCC)   [place] [facility] [place], [place] T, NP   3 months ago Subacute cough   [place] [facility] [place], Collins T, NP   3 months ago Influenza A   [place] [facility] [place], [place] T, NP   3 months ago Poorly controlled type 2 diabetes mellitus (HCC)   [place] [facility] [place], Pine[place] T, NP   4 months ago Influenza A   [place] [facility] [place], [place], DO       Future Appointments             In 2 months Cannady, Dorie Rank, NP [place] [facility]   In 10 months Deirdre Evener, MD  [place] [facility] [facility]            Passed - CBC within normal limits and completed in the last 12 months    WBC  Date Value Ref Range Status  02/25/2022 5.8 3.4 - 10.8 x10E3/uL Final  02/12/2022 4.4 4.0 - 10.5 K/uL Final   RBC  Date Value Ref Range Status  02/25/2022 4.23 3.77 - 5.28 x10E6/uL Final  02/12/2022 4.55 3.87 - 5.11 MIL/uL Final   Hemoglobin  Date Value Ref Range Status  02/25/2022 12.6 11.1 - 15.9 g/dL Final   Hematocrit  Date Value Ref Range Status  02/25/2022 37.3 34.0 - 46.6 % Final   MCHC  Date Value Ref Range Status  02/25/2022 33.8 31.5 - 35.7 g/dL Final   81/19/1478 29.5 30.0 - 36.0 g/dL Final   [facility]  Date Value Ref Range Status  02/25/2022 29.8 26.6 - 33.0 pg Final  02/12/2022 29.5 26.0 - 34.0 pg Final   MCV  Date Value Ref Range Status  02/25/2022 88 79 - 97 fL Final  06/09/2014 88 80 - 100 fL Final   No results found for: "PLTCOUNTKUC", "LABPLAT", "POCPLA" RDW  Date Value Ref Range Status  02/25/2022 12.2 11.7 - 15.4 % Final  06/09/2014 12.6 11.5 - 14.5 % Final          benazepril-hydrochlorthiazide (LOTENSIN HCT) 20-25 MG tablet 100 tablet 3    Sig: Take 1 tablet by mouth daily.     Cardiovascular:  ACEI + Diuretic Combos Passed - 06/19/2022  9:59 AM      Passed - Na in normal range and within 180 days    Sodium  Date Value Ref Range Status  05/20/2022 139 134 - 144 mmol/L Final  06/09/2014 136 mmol/L Final    Comment:    135-145 NOTE: New Reference Range  04/26/14  Passed - K in normal range and within 180 days    Potassium  Date Value Ref Range Status  05/20/2022 4.1 3.5 - 5.2 mmol/L Final  06/09/2014 3.3 (L) mmol/L Final    Comment:    3.5-5.1 NOTE: New Reference Range  04/26/14          Passed - Cr in normal range and within 180 days    Creatinine  Date Value Ref Range Status  06/09/2014 0.73 mg/dL Final    Comment:    1.61-0.96 NOTE: New Reference Range  04/26/14    Creatinine, Ser  Date Value Ref Range Status  05/20/2022 0.63 0.57 - 1.00 mg/dL Final         Passed - eGFR is 30 or above and within 180 days    EGFR (African American)  Date Value Ref Range Status  06/09/2014 >60  Final   GFR calc Af Amer  Date Value Ref Range Status  03/28/2020 103 >59 mL/min/1.73 Final    Comment:    **In accordance with recommendations from the NKF-ASN Task force,**   [facility] is in the process of updating its eGFR calculation to the   2021 CKD-EPI creatinine equation that estimates kidney function   without a race variable.    EGFR (Non-African Amer.)  Date Value Ref Range Status   06/09/2014 >60  Final    Comment:    eGFR values <25mL/min/1.73 m2 may be an indication of chronic kidney disease (CKD). Calculated eGFR is useful in patients with stable renal function. The eGFR calculation will not be reliable in acutely ill patients when serum creatinine is changing rapidly. It is not useful in patients on dialysis. The eGFR calculation may not be applicable to patients at the low and high extremes of body sizes, pregnant women, and vegetarians.    GFR, Estimated  Date Value Ref Range Status  02/12/2022 >60 >60 mL/min Final    Comment:    (NOTE) Calculated using the CKD-EPI Creatinine Equation (2021)    eGFR  Date Value Ref Range Status  05/20/2022 95 >59 mL/min/1.73 Final         Passed - Patient is not pregnant      Passed - Last BP in normal range    BP Readings from Last 1 Encounters:  05/20/22 114/72         Passed - Valid encounter within last 6 months    Recent Outpatient Visits           1 month ago Poorly controlled type 2 diabetes mellitus (HCC)   [place] [facility] [place], [place]vo T, NP   3 months ago Subacute cough   [place] [facility] [place], Hartley T, NP   3 months ago Influenza A   [place] [facility] [place], [place] T, NP   3 months ago Poorly controlled type 2 diabetes mellitus (HCC)   [place] [facility] [place], Dorie Rank, NP   4 months ago Influenza A   [place] [facility] [place], Oralia Rud, DO       Future Appointments             In 2 months Cannady, Dorie Rank, NP [place] [facility]   In 10 months Deirdre Evener, MD [facility]

## 2022-06-19 NOTE — Telephone Encounter (Signed)
Patient requesting #[phone] for better insurance benefit- Rx adjusted with remaining RF Requested Prescriptions  Pending Prescriptions Disp Refills   omeprazole (PRILOSEC) 20 MG capsule      Sig: Take 2 capsules (40 mg total) by mouth daily.     Gastroenterology: Proton Pump Inhibitors Passed - 06/19/2022  9:59 AM      Passed - Valid encounter within last 12 months    Recent Outpatient Visits           1 month ago Poorly controlled type 2 diabetes mellitus (HCC)   [facility] [facility] [facility] [place], Gray T, NP   3 months ago Subacute cough   [place] [facility] Kerby, Livermore T, NP   3 months ago Influenza A   [facility] [place], [place] T, NP   3 months ago Poorly controlled type 2 diabetes mellitus (HCC)   [place] [facility] [place], Corrie Dandy T, NP   4 months ago Influenza A   [facility] [place], [place], DO       Future Appointments             In 2 months Cannady, Dorie Rank, NP [place] [facility]   In 10 months Deirdre Evener, MD [facility]             atorvastatin (LIPITOR) 10 MG tablet 100 tablet 2    Sig: TAKE 1 TABLET(10 MG) BY MOUTH DAILY     Cardiovascular:  Antilipid - Statins Failed - 06/19/2022  9:59 AM      Failed - Lipid Panel in normal range within the last 12 months    Cholesterol, Total  Date Value Ref Range Status  05/20/2022 186 100 - 199 mg/dL Final   Cholesterol Piccolo, Waived  Date Value Ref Range Status  07/28/2018 159 <200 mg/dL Final    Comment:                            Desirable                <200                         Borderline High      200- 239                         High                     >239    LDL Chol Calc (NIH)  Date Value Ref Range Status  05/20/2022 105 (H) 0 - 99 mg/dL Final   HDL  Date Value Ref Range Status  05/20/2022 47 >39 mg/dL Final   Triglycerides  Date Value Ref  Range Status  05/20/2022 199 (H) 0 - 149 mg/dL Final   Triglycerides Piccolo,Waived  Date Value Ref Range Status  07/28/2018 308 (H) <150 mg/dL Final    Comment:                            Normal                   <150  Borderline High     150 - 199                         High                200 - 499                         Very High                >499          Passed - Patient is not pregnant      Passed - Valid encounter within last 12 months    Recent Outpatient Visits           1 month ago Poorly controlled type 2 diabetes mellitus (HCC)   [place] [facility] [place], [place] T, NP   3 months ago Subacute cough   [place] [facility] [place], [place] T, NP   3 months ago Influenza A   [place] [facility] Corn, [place] T, NP   3 months ago Poorly controlled type 2 diabetes mellitus (HCC)   [place] [facility] [place], Irondale T, NP   4 months ago Influenza A   [facility] [place], Claw[place], DO       Future Appointments             In 2 months Cannady, Dorie Rank, NP [facility]   In 10 months Deirdre Evener, MD  [facility]             metFORMIN (GLUCOPHAGE) 1000 MG tablet 200 tablet 2    Sig: TAKE 1 TABLET(1000 MG) BY MOUTH TWICE DAILY WITH A MEAL     Endocrinology:  Diabetes - Biguanides Passed - 06/19/2022  9:59 AM      Passed - Cr in normal range and within 360 days    Creatinine  Date Value Ref Range Status  06/09/2014 0.73 mg/dL Final    Comment:    1.61-0.96 NOTE: New Reference Range  04/26/14    Creatinine, Ser  Date Value Ref Range Status  05/20/2022 0.63 0.57 - 1.00 mg/dL Final         Passed - HBA1C is between 0 and 7.9 and within 180 days    Hemoglobin A1C  Date Value Ref Range Status  10/17/2015 7.4%  Final   HB A1C (BAYER DCA - WAIVED)  Date Value Ref Range Status   05/20/2022 6.8 (H) 4.8 - 5.6 % Final    Comment:             Prediabetes: 5.7 - 6.4          Diabetes: >6.4          Glycemic control for adults with diabetes: <7.0          Passed - eGFR in normal range and within 360 days    EGFR (African American)  Date Value Ref Range Status  06/09/2014 >60  Final   GFR calc Af Amer  Date Value Ref Range Status  03/28/2020 103 >59 mL/min/1.73 Final    Comment:    **In accordance with recommendations from the NKF-ASN Task force,**   [facility] is in the process of updating its eGFR calculation to the   2021 CKD-EPI creatinine equation that estimates kidney function   without  a race variable.    EGFR (Non-African Amer.)  Date Value Ref Range Status  06/09/2014 >60  Final    Comment:    eGFR values <21mL/min/1.73 m2 may be an indication of chronic kidney disease (CKD). Calculated eGFR is useful in patients with stable renal function. The eGFR calculation will not be reliable in acutely ill patients when serum creatinine is changing rapidly. It is not useful in patients on dialysis. The eGFR calculation may not be applicable to patients at the low and high extremes of body sizes, pregnant women, and vegetarians.    GFR, Estimated  Date Value Ref Range Status  02/12/2022 >60 >60 mL/min Final    Comment:    (NOTE) Calculated using the CKD-EPI Creatinine Equation (2021)    eGFR  Date Value Ref Range Status  05/20/2022 95 >59 mL/min/1.73 Final         Passed - B12 Level in normal range and within 720 days    Vitamin B-12  Date Value Ref Range Status  02/25/2022 1,074 232 - 1,245 pg/mL Final         Passed - Valid encounter within last 6 months    Recent Outpatient Visits           1 month ago Poorly controlled type 2 diabetes mellitus (HCC)   [place] [facility] [place], Orchard T, NP   3 months ago Subacute cough   [place] [facility] [place], [place] T, NP   3 months ago Influenza A   [facility] [place], Indian[place] T, NP   3 months ago Poorly controlled type 2 diabetes mellitus (HCC)   [place] [facility] Mountain, Strawberry T, NP   4 months ago Influenza A   [facility] [facility] [place], [place], DO       Future Appointments             In 2 months Cannady, Dorie Rank, NP [place] [facility]   In 10 months Deirdre Evener, MD [facility]            Passed - CBC within normal limits and completed in the last 12 months    WBC  Date Value Ref Range Status  02/25/2022 5.8 3.4 - 10.8 x10E3/uL Final  02/12/2022 4.4 4.0 - 10.5 K/uL Final   RBC  Date Value Ref Range Status  02/25/2022 4.23 3.77 - 5.28 x10E6/uL Final  02/12/2022 4.55 3.87 - 5.11 MIL/uL Final   Hemoglobin  Date Value Ref Range Status  02/25/2022 12.6 11.1 - 15.9 g/dL Final   Hematocrit  Date Value Ref Range Status  02/25/2022 37.3 34.0 - 46.6 % Final   MCHC  Date Value Ref Range Status  02/25/2022 33.8 31.5 - 35.7 g/dL Final  16/11/9602 54.0 30.0 - 36.0 g/dL Final   [facility]  Date Value Ref Range Status  02/25/2022 29.8 26.6 - 33.0 pg Final  02/12/2022 29.5 26.0 - 34.0 pg Final   MCV  Date Value Ref Range Status  02/25/2022 88 79 - 97 fL Final  06/09/2014 88 80 - 100 fL Final   No results found for: "PLTCOUNTKUC", "LABPLAT", "POCPLA" RDW  Date Value Ref Range Status  02/25/2022 12.2 11.7 - 15.4 % Final  06/09/2014 12.6 11.5 - 14.5 % Final          benazepril-hydrochlorthiazide (LOTENSIN HCT) 20-25 MG tablet 100 tablet 3    Sig: Take  1 tablet by mouth daily.     Cardiovascular:  ACEI + Diuretic Combos Passed - 06/19/2022  9:59 AM      Passed - Na in normal range and within 180 days    Sodium  Date Value Ref Range Status  05/20/2022 139 134 - 144 mmol/L Final  06/09/2014 136 mmol/L Final    Comment:    135-145 NOTE: New Reference Range  04/26/14          Passed - K in normal range  and within 180 days    Potassium  Date Value Ref Range Status  05/20/2022 4.1 3.5 - 5.2 mmol/L Final  06/09/2014 3.3 (L) mmol/L Final    Comment:    3.5-5.1 NOTE: New Reference Range  04/26/14          Passed - Cr in normal range and within 180 days    Creatinine  Date Value Ref Range Status  06/09/2014 0.73 mg/dL Final    Comment:    1.61-0.96 NOTE: New Reference Range  04/26/14    Creatinine, Ser  Date Value Ref Range Status  05/20/2022 0.63 0.57 - 1.00 mg/dL Final         Passed - eGFR is 30 or above and within 180 days    EGFR (African American)  Date Value Ref Range Status  06/09/2014 >60  Final   GFR calc Af Amer  Date Value Ref Range Status  03/28/2020 103 >59 mL/min/1.73 Final    Comment:    **In accordance with recommendations from the NKF-ASN Task force,**   [facility] is in the process of updating its eGFR calculation to the   2021 CKD-EPI creatinine equation that estimates kidney function   without a race variable.    EGFR (Non-African Amer.)  Date Value Ref Range Status  06/09/2014 >60  Final    Comment:    eGFR values <69mL/min/1.73 m2 may be an indication of chronic kidney disease (CKD). Calculated eGFR is useful in patients with stable renal function. The eGFR calculation will not be reliable in acutely ill patients when serum creatinine is changing rapidly. It is not useful in patients on dialysis. The eGFR calculation may not be applicable to patients at the low and high extremes of body sizes, pregnant women, and vegetarians.    GFR, Estimated  Date Value Ref Range Status  02/12/2022 >60 >60 mL/min Final    Comment:    (NOTE) Calculated using the CKD-EPI Creatinine Equation (2021)    eGFR  Date Value Ref Range Status  05/20/2022 95 >59 mL/min/1.73 Final         Passed - Patient is not pregnant      Passed - Last BP in normal range    BP Readings from Last 1 Encounters:  05/20/22 114/72         Passed - Valid encounter within  last 6 months    Recent Outpatient Visits           1 month ago Poorly controlled type 2 diabetes mellitus (HCC)   [place] [facility] [place], [place] T, NP   3 months ago Subacute cough   [place] [place], [place] T, NP   3 months ago Influenza A   [facility] [place], [place] T, NP   3 months ago Poorly controlled type 2 diabetes mellitus (HCC)   [facility] [place], Corrie Dandy T, NP   4 months ago Influenza A  [facility] Dorcas Carrow, DO       Future Appointments             In 2 months Marjie Skiff, NP [facility]   In 10 months Deirdre Evener, MD [facility]             Semaglutide (RYBELSUS) 7 MG TABS 30 tablet 12    Sig: Take 1 tablet (7 mg total) by mouth daily.     Off-Protocol Failed - 06/19/2022  9:59 AM      Failed - Medication not assigned to a protocol, review manually.      Passed - Valid encounter within last 12 months    Recent Outpatient Visits           1 month ago Poorly controlled type 2 diabetes mellitus (HCC)   [place] [facility] [place], Farmer T, NP   3 months ago Subacute cough   [place] [facility] [place], [place] T, NP   3 months ago Influenza A   [place] [place], [place] T, NP   3 months ago Poorly controlled type 2 diabetes mellitus (HCC)   [place] [facility] [place], Dorie Rank, NP   4 months ago Influenza A   [place] [facility] [facility] [place], Oralia Rud, DO       Future Appointments             In 2 months Cannady, Dorie Rank, NP  [facility]   In 10 months Deirdre Evener, MD [facility]

## 2022-07-01 DIAGNOSIS — E113393 Type 2 diabetes mellitus with moderate nonproliferative diabetic retinopathy without macular edema, bilateral: Secondary | ICD-10-CM | POA: Diagnosis not present

## 2022-07-08 ENCOUNTER — Ambulatory Visit
Admission: RE | Admit: 2022-07-08 | Discharge: 2022-07-08 | Disposition: A | Discharge status: Home / Self Care | Payer: 59 | Source: Ambulatory Visit | Attending: Nurse Practitioner | Admitting: Nurse Practitioner

## 2022-07-08 DIAGNOSIS — Z1231 Encounter for screening mammogram for malignant neoplasm of breast: Secondary | ICD-10-CM

## 2022-07-10 NOTE — Progress Notes (Signed)
Contacted via MyChart   Normal mammogram, may repeat in one year:)

## 2022-07-22 DIAGNOSIS — H43812 Vitreous degeneration, left eye: Secondary | ICD-10-CM | POA: Diagnosis not present

## 2022-08-06 ENCOUNTER — Other Ambulatory Visit: Payer: Self-pay | Admitting: Nurse Practitioner

## 2022-08-06 NOTE — Telephone Encounter (Signed)
No longer current dosing of this medication Requested Prescriptions  Pending Prescriptions Disp Refills   omeprazole (PRILOSEC) 40 MG capsule [Pharmacy Med Name: OMEPRAZOLE 40MG  CAPSULES] 90 capsule 4    Sig: TAKE 1 CAPSULE(40 MG) BY MOUTH DAILY     Gastroenterology: Proton Pump Inhibitors Passed - 08/06/2022 10:01 AM      Passed - Valid encounter within last 12 months    Recent Outpatient Visits           2 months ago Poorly controlled type 2 diabetes mellitus (HCC)   [place] [facility] [place], [place] T, NP   4 months ago Subacute cough   [facility] [place], [place] T, NP   4 months ago Influenza A   [place] [facility] [place], [place] T, NP   5 months ago Poorly controlled type 2 diabetes mellitus (HCC)   [place] [place], Dorie Rank, NP   5 months ago Influenza A   [place] [facility] [place], Oralia Rud, DO       Future Appointments             In 2 weeks Cannady, Dorie Rank, NP [facility]   In 9 months Deirdre Evener, MD [facility]

## 2022-08-17 NOTE — Patient Instructions (Signed)
Diabetes Mellitus Basics  Diabetes mellitus, or diabetes, is a long-term (chronic) disease. It occurs when the body does not properly use sugar (glucose) that is released from food after you eat. Diabetes mellitus may be caused by one or both of these problems: Your pancreas does not make enough of a hormone called insulin. Your body does not react in a normal way to the insulin that it makes. Insulin lets glucose enter cells in your body. This gives you energy. If you have diabetes, glucose cannot get into cells. This causes high blood glucose (hyperglycemia). How to treat and manage diabetes You may need to take insulin or other diabetes medicines daily to keep your glucose in balance. If you are prescribed insulin, you will learn how to give yourself insulin by injection. You may need to adjust the amount of insulin you take based on the foods that you eat. You will need to check your blood glucose levels using a glucose monitor as told by your health care provider. The readings can help determine if you have low or high blood glucose. Generally, you should have these blood glucose levels: Before meals (preprandial): 80-130 mg/dL (4.4-7.2 mmol/L). After meals (postprandial): below 180 mg/dL (10 mmol/L). Hemoglobin A1c (HbA1c) level: less than 7%. Your health care provider will set treatment goals for you. Keep all follow-up visits. This is important. Follow these instructions at home: Diabetes medicines Take your diabetes medicines every day as told by your health care provider. List your diabetes medicines here: Name of medicine: ______________________________ Amount (dose): _______________ Time (a.m./p.m.): _______________ Notes: ___________________________________ Name of medicine: ______________________________ Amount (dose): _______________ Time (a.m./p.m.): _______________ Notes: ___________________________________ Name of medicine: ______________________________ Amount (dose):  _______________ Time (a.m./p.m.): _______________ Notes: ___________________________________ Insulin If you use insulin, list the types of insulin you use here: Insulin type: ______________________________ Amount (dose): _______________ Time (a.m./p.m.): _______________Notes: ___________________________________ Insulin type: ______________________________ Amount (dose): _______________ Time (a.m./p.m.): _______________ Notes: ___________________________________ Insulin type: ______________________________ Amount (dose): _______________ Time (a.m./p.m.): _______________ Notes: ___________________________________ Insulin type: ______________________________ Amount (dose): _______________ Time (a.m./p.m.): _______________ Notes: ___________________________________ Insulin type: ______________________________ Amount (dose): _______________ Time (a.m./p.m.): _______________ Notes: ___________________________________ Managing blood glucose  Check your blood glucose levels using a glucose monitor as told by your health care provider. Write down the times that you check your glucose levels here: Time: _______________ Notes: ___________________________________ Time: _______________ Notes: ___________________________________ Time: _______________ Notes: ___________________________________ Time: _______________ Notes: ___________________________________ Time: _______________ Notes: ___________________________________ Time: _______________ Notes: ___________________________________  Low blood glucose Low blood glucose (hypoglycemia) is when glucose is at or below 70 mg/dL (3.9 mmol/L). Symptoms may include: Feeling: Hungry. Sweaty and clammy. Irritable or easily upset. Dizzy. Sleepy. Having: A fast heartbeat. A headache. A change in your vision. Numbness around the mouth, lips, or tongue. Having trouble with: Moving (coordination). Sleeping. Treating low blood glucose To treat low blood  glucose, eat or drink something containing sugar right away. If you can think clearly and swallow safely, follow the 15:15 rule: Take 15 grams of a fast-acting carb (carbohydrate), as told by your health care provider. Some fast-acting carbs are: Glucose tablets: take 3-4 tablets. Hard candy: eat 3-5 pieces. Fruit juice: drink 4 oz (120 mL). Regular (not diet) soda: drink 4-6 oz (120-180 mL). Honey or sugar: eat 1 Tbsp (15 mL). Check your blood glucose levels 15 minutes after you take the carb. If your glucose is still at or below 70 mg/dL (3.9 mmol/L), take 15 grams of a carb again. If your glucose does not go above 70 mg/dL (3.9 mmol/L) after   3 tries, get help right away. After your glucose goes back to normal, eat a meal or a snack within 1 hour. Treating very low blood glucose If your glucose is at or below 54 mg/dL (3 mmol/L), you have very low blood glucose (severe hypoglycemia). This is an emergency. Do not wait to see if the symptoms will go away. Get medical help right away. Call your local emergency services (911 in the [place] Do not drive yourself to the hospital. Questions to ask your health care provider Should I talk with a diabetes educator? What equipment will I need to care for myself at home? What diabetes medicines do I need? When should I take them? How often do I need to check my blood glucose levels? What number can I call if I have questions? When is my follow-up visit? Where can I find a support group for people with diabetes? Where to find more information American Diabetes Association: www.diabetes.org Association of Diabetes Care and Education Specialists: www.diabeteseducator.org Contact a health care provider if: Your blood glucose is at or above 240 mg/dL (13.3 mmol/L) for 2 days in a row. You have been sick or have had a fever for 2 days or more, and you are not getting better. You have any of these problems for more than 6 hours: You cannot eat or  drink. You feel nauseous. You vomit. You have diarrhea. Get help right away if: Your blood glucose is lower than 54 mg/dL (3 mmol/L). You get confused. You have trouble thinking clearly. You have trouble breathing. These symptoms may represent a serious problem that is an emergency. Do not wait to see if the symptoms will go away. Get medical help right away. Call your local emergency services (911 in the [place] Do not drive yourself to the hospital. Summary Diabetes mellitus is a chronic disease that occurs when the body does not properly use sugar (glucose) that is released from food after you eat. Take insulin and diabetes medicines as told. Check your blood glucose every day, as often as told. Keep all follow-up visits. This is important. This information is not intended to replace advice given to you by your health care provider. Make sure you discuss any questions you have with your health care provider. Document Revised: 06/08/2019 Document Reviewed: 06/08/2019 Elsevier Patient Education  2024 Elsevier Inc.  

## 2022-08-20 ENCOUNTER — Ambulatory Visit: Payer: 59 | Admitting: Nurse Practitioner

## 2022-08-21 ENCOUNTER — Ambulatory Visit (INDEPENDENT_AMBULATORY_CARE_PROVIDER_SITE_OTHER): Payer: 59 | Admitting: Nurse Practitioner

## 2022-08-21 ENCOUNTER — Encounter: Payer: Self-pay | Admitting: Nurse Practitioner

## 2022-08-21 VITALS — BP 120/79 | HR 84 | Temp 98.5°F | Ht 65.0 in | Wt 161.6 lb

## 2022-08-21 DIAGNOSIS — Z20822 Contact with and (suspected) exposure to covid-19: Secondary | ICD-10-CM

## 2022-08-21 DIAGNOSIS — F5101 Primary insomnia: Secondary | ICD-10-CM | POA: Diagnosis not present

## 2022-08-21 DIAGNOSIS — E785 Hyperlipidemia, unspecified: Secondary | ICD-10-CM | POA: Diagnosis not present

## 2022-08-21 DIAGNOSIS — M1712 Unilateral primary osteoarthritis, left knee: Secondary | ICD-10-CM | POA: Diagnosis not present

## 2022-08-21 DIAGNOSIS — E1159 Type 2 diabetes mellitus with other circulatory complications: Secondary | ICD-10-CM

## 2022-08-21 DIAGNOSIS — I152 Hypertension secondary to endocrine disorders: Secondary | ICD-10-CM

## 2022-08-21 DIAGNOSIS — E1165 Type 2 diabetes mellitus with hyperglycemia: Secondary | ICD-10-CM | POA: Diagnosis not present

## 2022-08-21 DIAGNOSIS — E1169 Type 2 diabetes mellitus with other specified complication: Secondary | ICD-10-CM | POA: Diagnosis not present

## 2022-08-21 LAB — BAYER DCA HB A1C WAIVED: HB A1C (BAYER DCA - WAIVED): 7.9 % — ABNORMAL HIGH (ref 4.8–5.6)

## 2022-08-21 MED ORDER — METFORMIN HCL 1000 MG PO TABS: ORAL_TABLET | ORAL | 4 refills | Status: DC

## 2022-08-21 MED ORDER — ATORVASTATIN CALCIUM 10 MG PO TABS: ORAL_TABLET | ORAL | 4 refills | Status: DC

## 2022-08-21 MED ORDER — FLUTICASONE PROPIONATE 50 MCG/ACT NA SUSP: 2.0000 | Freq: Every day | NASAL | 6 refills | Status: DC

## 2022-08-21 MED ORDER — TRAZODONE HCL 100 MG PO TABS: 100.0000 mg | ORAL_TABLET | Freq: Every day | ORAL | 4 refills | Status: DC

## 2022-08-21 MED ORDER — BENAZEPRIL-HYDROCHLOROTHIAZIDE 20-25 MG PO TABS: 1.0000 | ORAL_TABLET | Freq: Every day | ORAL | 4 refills | Status: DC

## 2022-08-21 NOTE — Assessment & Plan Note (Signed)
Chronic, stable.  BP at goal in office today.  Continue current medication regimen and adjust as needed -- Lotensin offering kidney protection.  Recommend checking BP at home three mornings a week and documenting for provider review.  LABS: BMP.  Urine ALB 18 June 2022.  Return in 3 months.

## 2022-08-21 NOTE — Assessment & Plan Note (Signed)
Chronic, ongoing.  No benefit from Melatonin or CBD.  Discussed with her and will increase Trazodone to 100 MG nightly as needed.  Educated her on this medication and use + side effects.  Recommend heavy focus on sleep hygiene techniques, discussed with her.

## 2022-08-21 NOTE — Addendum Note (Signed)
Addended by: Aura Dials T on: 08/21/2022 03:18 PM   Modules accepted: Level of Service

## 2022-08-21 NOTE — Assessment & Plan Note (Signed)
Chronic, ongoing.  Continue current medication regimen and adjust as needed. Lipid panel today. 

## 2022-08-21 NOTE — Assessment & Plan Note (Signed)
Chronic, ongoing.  A1c today 7.9% trend up due to dietary indiscretions. Urine ALB 18 June 2022 - on ACE. Continue Lotensin for kidney protection. - Recommend she take Metformin 1000 MG BID as ordered (not once daily) and Rybelsus 7 MG daily -- would like to avoid insulin. Did not tolerate SGLT2 or GLP1 injections in past. Continue diet changes at home. Recommend to monitor BS daily at home and document daily with fasting BS goal of <130.  Monitor diet closely with focus on diabetic diet and regular exercise. - Eye and Foot exam up to date - On ACE and statin therapy - Vaccinations up to date

## 2022-08-21 NOTE — Progress Notes (Signed)
BP 120/79   Pulse 84   Temp 98.5 F (36.9 C) (Oral)   Ht 5\' 5"  (1.651 m)   Wt 161 lb 9.6 oz (73.3 kg)   LMP  (LMP Unknown) Comment: age 72  SpO2 95%   BMI 26.89 kg/m    Subjective:    Patient ID: Cheryl Mendez, female    DOB: 1950-10-25, 72 y.o.   MRN: [id number]  HPI: Cheryl Mendez is a 72 y.o. female  Chief Complaint  Patient presents with   Diabetes   Hypertension   Hyperlipidemia   Insomnia   Knee Pain    Left knee pain    Covid Exposure    Helped someone take a covid test, came back positive, patient has not taken test.   DIABETES A1c 6.8% April.  Currently taking Rybelsus 7 MG (can not tolerate higher doses due to elevation in pancreas levels) and Metformin 1000 MG once daily (tolerates daily dosing).  Continues to take B12 daily for deficiency.  Some bloating and occasional constipation with Rybelsus -- takes occasional Metamucil gummies + Milk of Mag.  Has been eating more watermelon and cantaloupe recently + cake and ice cream on birthday.  In past took Trulicity with GI issues and Invokana stopped due to foot concerns + Jardiance made her sick. Hypoglycemic episodes:no Polydipsia/polyuria: no Visual disturbance: no Chest pain: no Paresthesias: no Glucose Monitoring: no  Accucheck frequency: occasional  Fasting glucose: 120 on average -- 200 at one time  Post prandial:  Evening:  Before meals: Taking Insulin?: no  Long acting insulin:  Short acting insulin: Blood Pressure Monitoring: not checking Retinal Examination: Up to Date - Dr. Alvester Morin in June Foot Exam: Up to Date Pneumovax: Up to Date Influenza: Up to Date Aspirin: no   HYPERTENSION / HYPERLIPIDEMIA Currently taking Lotensin 20-25 and Atorvastatin 10 MG.  Satisfied with current treatment? yes Duration of hypertension: chronic BP monitoring frequency: not checking BP range:  BP medication side effects: no Duration of hyperlipidemia: chronic Cholesterol medication side effects:  no Cholesterol supplements: none Medication compliance: good compliance Aspirin: no Recent stressors: no Recurrent headaches: no Visual changes: no Palpitations: no Dyspnea: no Chest pain: no Lower extremity edema: no Dizzy/lightheaded: no The 10-year ASCVD risk score (Arnett DK, et al., 2019) is: 24.9%   Values used to calculate the score:     Age: 39 years     Sex: Female     Is Non-Hispanic African American: No     Diabetic: Yes     Tobacco smoker: No     Systolic Blood Pressure: 120 mmHg     Is BP treated: Yes     HDL Cholesterol: 47 mg/dL     Total Cholesterol: 186 mg/dL  KNEE PAIN (LEFT) Ongoing issue.  Provided knee steroid injection on 02/20/21.  Had imaging -- noted to have significant joint space loss, especially to medial aspect.  Pain improved with steroid injection for about 6 months.  Has seen [facility] in past for this.  Last steroid injection with ortho September 2023.  She would like an injection today. Duration: chronic Involved knee: left Mechanism of injury: unknown Location: medial Onset: sudden Severity: moderate Quality: 7/10 Frequency: intermittent Radiation: no Aggravating factors: moving, bending, picking things up, walking up hill Status: worsening Treatments attempted:  Tizanidine, Meloxicam, Aleeve, steroid injections Relief with NSAIDs?:   yes Weakness with weight bearing or walking: yes Sensation of giving way: yes Locking: no Popping: no Bruising: no Swelling: no Redness:  no Paresthesias/decreased sensation: no Fevers: no    INSOMNIA Taking Trazodone nightly, she feels it is not strong enough.  Tried Melatonin, but did not help and made her swimmy headed.  Tried OTC CBD and did not offer much benefit.  Used to work 2nd shift in past.  Consistently wakes at 3 am due to this. Duration: years Satisfied with sleep quality: yes Difficulty falling asleep: yes Difficulty staying asleep: no Waking a few hours after sleep onset: no Early  morning awakenings: no Daytime hypersomnolence: no Wakes feeling refreshed: yes Good sleep hygiene: yes Apnea: no Snoring: no Depressed/anxious mood: no Recent stress: no Restless legs/nocturnal leg cramps: no Chronic pain/arthritis: yes History of sleep study: no Treatments attempted: melatonin, CBD, Trazodone  COVID EXPOSURE Exposed by her neighbor, helped her do Covid test this morning which was positive.  She was not wearing mask at the time, only around her a little bit yesterday outside.  Neighbor had felt bad since Sunday. Fever: no Cough: no Shortness of breath: no Wheezing: no Chest pain: no Chest tightness: no Chest congestion: no Nasal congestion: no Runny nose: no Post nasal drip: no Sneezing: no Sore throat: no Swollen glands: no Sinus pressure: no Headache: no Face pain: no Toothache: no Ear pain: none Ear pressure: none Eyes red/itching:no Eye drainage/crusting: no  Vomiting: no Rash: no Fatigue: no  Relevant past medical, surgical, family and social history reviewed and updated as indicated. Interim medical history since our last visit reviewed. Allergies and medications reviewed and updated.  Review of Systems  Constitutional:  Negative for activity change, appetite change, diaphoresis, fatigue and fever.  HENT: Negative.    Respiratory:  Negative for cough, chest tightness, shortness of breath and wheezing.   Cardiovascular:  Negative for chest pain, palpitations and leg swelling.  Gastrointestinal: Negative.   Endocrine: Negative for cold intolerance, heat intolerance, polydipsia, polyphagia and polyuria.  Musculoskeletal:  Positive for arthralgias.  Neurological: Negative.   Psychiatric/Behavioral:  Positive for sleep disturbance. Negative for decreased concentration, self-injury and suicidal ideas. The patient is not nervous/anxious.    Per HPI unless specifically indicated above     Objective:    BP 120/79   Pulse 84   Temp 98.5 F  (36.9 C) (Oral)   Ht 5\' 5"  (1.651 m)   Wt 161 lb 9.6 oz (73.3 kg)   LMP  (LMP Unknown) Comment: age 65  SpO2 95%   BMI 26.89 kg/m   Wt Readings from Last 3 Encounters:  08/21/22 161 lb 9.6 oz (73.3 kg)  05/20/22 159 lb 9.6 oz (72.4 kg)  05/01/22 157 lb 12.8 oz (71.6 kg)    Physical Exam Vitals and nursing note reviewed.  Constitutional:      General: She is awake. She is not in acute distress.    Appearance: She is well-developed and overweight. She is not ill-appearing.  HENT:     Head: Normocephalic.     Right Ear: Hearing, tympanic membrane, ear canal and external ear normal.     Left Ear: Hearing, tympanic membrane, ear canal and external ear normal.     Nose: Nose normal.     Right Sinus: No maxillary sinus tenderness or frontal sinus tenderness.     Left Sinus: No maxillary sinus tenderness or frontal sinus tenderness.     Mouth/Throat:     Mouth: Mucous membranes are moist.     Pharynx: No pharyngeal swelling, oropharyngeal exudate or posterior oropharyngeal erythema.  Eyes:     General: Lids are  normal.        Right eye: No discharge.        Left eye: No discharge.     Conjunctiva/sclera: Conjunctivae normal.     Pupils: Pupils are equal, round, and reactive to light.  Neck:     Thyroid: No thyromegaly.     Vascular: No carotid bruit or JVD.  Cardiovascular:     Rate and Rhythm: Normal rate and regular rhythm.     Heart sounds: Normal heart sounds. No murmur heard.    No gallop.  Pulmonary:     Effort: Pulmonary effort is normal. No accessory muscle usage or respiratory distress.     Breath sounds: Normal breath sounds.  Abdominal:     General: Bowel sounds are normal. There is no distension.     Palpations: Abdomen is soft.  Musculoskeletal:     Cervical back: Normal range of motion and neck supple.     Right knee: Normal.     Left knee: Crepitus present. No swelling or bony tenderness. Normal range of motion. Tenderness present over the medial joint line.      Instability Tests: Anterior drawer test negative. Posterior drawer test negative. Medial McMurray test negative and lateral McMurray test negative.     Right lower leg: No edema.     Left lower leg: No edema.  Lymphadenopathy:     Cervical: No cervical adenopathy.  Skin:    General: Skin is warm and dry.  Neurological:     Mental Status: She is alert and oriented to person, place, and time.     Motor: Motor function is intact.     Coordination: Coordination is intact.     Gait: Gait is intact.     Deep Tendon Reflexes: Reflexes are normal and symmetric.     Reflex Scores:      Brachioradialis reflexes are 2+ on the right side and 2+ on the left side.      Patellar reflexes are 2+ on the right side and 2+ on the left side. Psychiatric:        Attention and Perception: Attention normal.        Mood and Affect: Mood normal.        Behavior: Behavior normal. Behavior is cooperative.        Thought Content: Thought content normal.        Judgment: Judgment normal.    Diabetic Foot Exam - Simple   Simple Foot Form Visual Inspection No deformities, no ulcerations, no other skin breakdown bilaterally: Yes Sensation Testing Intact to touch and monofilament testing bilaterally: Yes Pulse Check Posterior Tibialis and Dorsalis pulse intact bilaterally: Yes Comments     STEROID INJECTION  Procedure: Left Knee Intraarticular Steroid Injection  Description: After verbal consent and patient education on procedure, area prepped and draped using semi-sterile technique. Using a anterior  approach, a mixture of 4 cc of  1% Marcaine & 1 cc of Kenalog 40 was injected into knee joint.  A bandage was then placed over the injection site. Complications:  none Post Procedure Instructions: To the ER if any symptoms of erythema or swelling.   Follow Up: PRN   Results for orders placed or performed in visit on 08/21/22  Bayer DCA Hb A1c Waived  Result Value Ref Range   HB A1C (BAYER DCA - WAIVED)  7.9 (H) 4.8 - 5.6 %      Assessment & Plan:   Problem List Items Addressed This Visit  Cardiovascular and Mediastinum   Hypertension associated with diabetes (HCC)    Chronic, stable.  BP at goal in office today.  Continue current medication regimen and adjust as needed -- Lotensin offering kidney protection.  Recommend checking BP at home three mornings a week and documenting for provider review.  LABS: BMP.  Urine ALB 18 June 2022.  Return in 3 months.      Relevant Medications   metFORMIN (GLUCOPHAGE) 1000 MG tablet   benazepril-hydrochlorthiazide (LOTENSIN HCT) 20-25 MG tablet   atorvastatin (LIPITOR) 10 MG tablet   Other Relevant Orders   Bayer DCA Hb A1c Waived (Completed)   Basic metabolic panel     Endocrine   Hyperlipidemia associated with type 2 diabetes mellitus (HCC)    Chronic, ongoing.  Continue current medication regimen and adjust as needed.  Lipid panel today.      Relevant Medications   metFORMIN (GLUCOPHAGE) 1000 MG tablet   benazepril-hydrochlorthiazide (LOTENSIN HCT) 20-25 MG tablet   atorvastatin (LIPITOR) 10 MG tablet   Other Relevant Orders   Bayer DCA Hb A1c Waived (Completed)   Lipid Panel w/o Chol/HDL Ratio   Poorly controlled type 2 diabetes mellitus (HCC) - Primary    Chronic, ongoing.  A1c today 7.9% trend up due to dietary indiscretions. Urine ALB 18 June 2022 - on ACE. Continue Lotensin for kidney protection. - Recommend she take Metformin 1000 MG BID as ordered (not once daily) and Rybelsus 7 MG daily -- would like to avoid insulin. Did not tolerate SGLT2 or GLP1 injections in past. Continue diet changes at home. Recommend to monitor BS daily at home and document daily with fasting BS goal of <130.  Monitor diet closely with focus on diabetic diet and regular exercise. - Eye and Foot exam up to date - On ACE and statin therapy - Vaccinations up to date      Relevant Medications   metFORMIN (GLUCOPHAGE) 1000 MG tablet    benazepril-hydrochlorthiazide (LOTENSIN HCT) 20-25 MG tablet   atorvastatin (LIPITOR) 10 MG tablet   Other Relevant Orders   Bayer DCA Hb A1c Waived (Completed)     Musculoskeletal and Integument   Osteoarthritis of left knee    Ongoing issue, significant arthritic changes present.  Continue at home regimen and return to ortho as needed.  Provided knee steroid injection today, tolerated well without issue.  Is aware of red flags to monitor for.  To ice knee every 4 hours for next 24 hours while awake + move around often.  Suspect will need replacement in future.        Other   Insomnia    Chronic, ongoing.  No benefit from Melatonin or CBD.  Discussed with her and will increase Trazodone to 100 MG nightly as needed.  Educated her on this medication and use + side effects.  Recommend heavy focus on sleep hygiene techniques, discussed with her.      Other Visit Diagnoses     Close exposure to COVID-19 virus       Currently no symptoms, recent exposure.  Aware to schedule visit or head to urgent care if symptoms present.        Follow up plan: Return in about 3 months (around 11/21/2022) for T2DM, HTN/HLD, INSOMNIA.

## 2022-08-21 NOTE — Assessment & Plan Note (Addendum)
Ongoing issue, significant arthritic changes present.  Continue at home regimen and return to ortho as needed.  Provided knee steroid injection today, tolerated well without issue.  Is aware of red flags to monitor for.  To ice knee every 4 hours for next 24 hours while awake + move around often.  Suspect will need replacement in future.

## 2022-08-22 ENCOUNTER — Other Ambulatory Visit: Payer: Self-pay | Admitting: Nurse Practitioner

## 2022-08-22 LAB — BASIC METABOLIC PANEL
BUN/Creatinine Ratio: 24 (ref 12–28)
BUN: 16 mg/dL (ref 8–27)
CO2: 27 mmol/L (ref 20–29)
Calcium: 9.4 mg/dL (ref 8.7–10.3)
Chloride: 98 mmol/L (ref 96–106)
Creatinine, Ser: 0.66 mg/dL (ref 0.57–1.00)
Glucose: 160 mg/dL — ABNORMAL HIGH (ref 70–99)
Potassium: 3.8 mmol/L (ref 3.5–5.2)
Sodium: 140 mmol/L (ref 134–144)
eGFR: 93 mL/min/{1.73_m2} (ref >59)

## 2022-08-22 LAB — LIPID PANEL W/O CHOL/HDL RATIO
Cholesterol, Total: 176 mg/dL (ref 100–199)
HDL: 41 mg/dL
LDL Chol Calc (NIH): 94 mg/dL (ref 0–99)
Triglycerides: 242 mg/dL — ABNORMAL HIGH (ref 0–149)
VLDL Cholesterol Cal: 41 mg/dL — ABNORMAL HIGH (ref 5–40)

## 2022-08-22 MED ORDER — ATORVASTATIN CALCIUM 20 MG PO TABS: 20.0000 mg | ORAL_TABLET | Freq: Every day | ORAL | 4 refills | Status: DC

## 2022-08-22 NOTE — Progress Notes (Signed)
Good morning, please let Cheryl Mendez know her labs have returned: - Kidney function, creatinine and eGFR, remains normal. - Cholesterol levels remain above goal.  I am going to increase your Atorvastatin to 20 MG daily and we will recheck next visit.  Please ensure you take this medication every day.  Stop Atorvastatin 10 MG dosing and start the 20 MG I send in.  Any questions? Keep being stellar!!  Thank you for allowing me to participate in your care.  I appreciate you. Kindest regards, Marilu Rylander

## 2022-09-19 ENCOUNTER — Telehealth: Payer: Self-pay | Admitting: Nurse Practitioner

## 2022-09-19 NOTE — Telephone Encounter (Signed)
Copied from CRM [id number]. Topic: General - Other >> Sep 19, 2022 10:37 AM Phill Myron wrote: Attn: Aura Dials    Ms Cheryl Mendez stated that she tried  her friends medicine Pregabalin and when she took it she got relief from her foot issues and she was able to sleep. She ask can you consider this medication for me to start taking. Thank you

## 2022-09-20 NOTE — Telephone Encounter (Signed)
Patient notified of Jolene's message.   °

## 2022-10-07 DIAGNOSIS — L821 Other seborrheic keratosis: Secondary | ICD-10-CM | POA: Diagnosis not present

## 2022-10-07 DIAGNOSIS — L570 Actinic keratosis: Secondary | ICD-10-CM | POA: Diagnosis not present

## 2022-10-07 DIAGNOSIS — L298 Other pruritus: Secondary | ICD-10-CM | POA: Diagnosis not present

## 2022-10-10 ENCOUNTER — Ambulatory Visit: Payer: Self-pay | Admitting: *Deleted

## 2022-10-10 NOTE — Telephone Encounter (Signed)
  Chief Complaint: upper abdominal pain taking more omeprazole than prescribed at times.  Concerned red skin where recent mole removed. Symptoms: c/o upper abdominal pain since taking rybelsus. Taking more prilosec than normal twice daily at times. Reports recent mole removed and skin was red and bleeding now stopped and skin less red. Frequency: 1 week and 1/2 but has been issues since starting rybelsus Pertinent Negatives: Patient denies chest pain no difficulty breathing no vomiting Disposition: [] ED /[] Urgent Care (no appt availability in office) / [x] Appointment(In office/virtual)/ []  [facility]/ [] Home Care/ [] Refused Recommended Disposition /[] [facility]/ []  Follow-up with PCP Additional Notes:   Earliest appt with PCP 10/17/22. Patient did not want to see another provider. Patient requesting if she continues to take prilosec will medication interact with her diabetic medication ? Recommended to contact pharmacy as well. Please advise.     Reason for Disposition  Abdominal pain is a chronic symptom (recurrent or ongoing AND present > 4 weeks)  Answer Assessment - Initial Assessment Questions 1. LOCATION: "Where does it hurt?"      Upper abdomen 2. RADIATION: "Does the pain shoot anywhere else?" (e.g., chest, back)     Did not answer 3. ONSET: "When did the pain begin?" (e.g., minutes, hours or days ago)      Since taking rybelsus 4. SUDDEN: "Gradual or sudden onset?"     When taking with rybelsus 5. PATTERN "Does the pain come and go, or is it constant?"    - If it comes and goes: "How long does it last?" "Do you have pain now?"     (Note: Comes and goes means the pain is intermittent. It goes away completely between bouts.)    - If constant: "Is it getting better, staying the same, or getting worse?"      (Note: Constant means the pain never goes away completely; most serious pain is constant and gets worse.)      Comes and goes  6. SEVERITY: "How bad is  the pain?"  (e.g., Scale 1-10; mild, moderate, or severe)    - MILD (1-3): Doesn't interfere with normal activities, abdomen soft and not tender to touch..     - MODERATE (4-7): Interferes with normal activities or awakens from sleep, abdomen tender to touch.     - SEVERE (8-10): Excruciating pain, doubled over, unable to do any normal activities.       na 7. RECURRENT SYMPTOM: "Have you ever had this type of stomach pain before?" If Yes, ask: "When was the last time?" and "What happened that time?"      Yes  decreased dose of rybelsus 8. AGGRAVATING FACTORS: "Does anything seem to cause this pain?" (e.g., foods, stress, alcohol)     na 9. CARDIAC SYMPTOMS: "Do you have any of the following symptoms: chest pain, difficulty breathing, sweating, nausea?"     Throbbing pain in stomach , nausea no other sx 10. OTHER SYMPTOMS: "Do you have any other symptoms?" (e.g., back pain, diarrhea, fever, urination pain, vomiting)      Recent mole removed and redness to area, better today but concerned healing  11. PREGNANCY: "Is there any chance you are pregnant?" "When was your last menstrual period?"       na  Protocols used: Abdominal Pain - Upper-A-AH

## 2022-10-10 NOTE — Telephone Encounter (Signed)
Called and notified of patient of Jolene's message.

## 2022-10-12 ENCOUNTER — Other Ambulatory Visit: Payer: Self-pay | Admitting: Nurse Practitioner

## 2022-10-13 NOTE — Patient Instructions (Signed)
Abdominal Pain, Adult  Many things can cause belly (abdominal) pain. In most cases, belly pain is not a serious problem and can be watched and treated at home. But in some cases, it can be serious. Your doctor will try to find the cause of your belly pain. Follow these instructions at home: Medicines Take over-the-counter and prescription medicines only as told by your doctor. Do not take medicines that help you poop (laxatives) unless told by your doctor. General instructions Watch your belly pain for any changes. Tell your doctor if the pain gets worse. Drink enough fluid to keep your pee (urine) pale yellow. Contact a doctor if: Your belly pain changes or gets worse. You have very bad cramping or bloating in your belly. You vomit. Your pain gets worse with meals, after eating, or with certain foods. You have trouble pooping or have watery poop for more than 2-3 days. You are not hungry, or you lose weight without trying. You have signs of not getting enough fluid or water (dehydration). These may include: Dark pee, very little pee, or no pee. Cracked lips or dry mouth. Feeling sleepy or weak. You have pain when you pee or poop. Your belly pain wakes you up at night. You have blood in your pee. You have a fever. Get help right away if: You cannot stop vomiting. Your pain is only in one part of your belly, like on the right side. You have bloody or black poop, or poop that looks like tar. You have trouble breathing. You have chest pain. These symptoms may be an emergency. Get help right away. Call 911. Do not wait to see if the symptoms will go away. Do not drive yourself to the hospital. This information is not intended to replace advice given to you by your health care provider. Make sure you discuss any questions you have with your health care provider. Document Revised: 11/21/2021 Document Reviewed: 11/21/2021 Elsevier Patient Education  2024 Elsevier Inc.  

## 2022-10-14 NOTE — Telephone Encounter (Signed)
Requested Prescriptions  Pending Prescriptions Disp Refills   loratadine (CLARITIN) 10 MG tablet [Pharmacy Med Name: ALLERGY RELF (LORATADINE) 10MG  TABS] 90 tablet 1    Sig: TAKE 1 TABLET(10 MG) BY MOUTH DAILY     Ear, Nose, and Throat:  Antihistamines 2 Passed - 10/12/2022  7:02 AM      Passed - Cr in normal range and within 360 days    Creatinine  Date Value Ref Range Status  06/09/2014 0.73 mg/dL Final    Comment:    1.61-0.96 NOTE: New Reference Range  04/26/14    Creatinine, Ser  Date Value Ref Range Status  08/21/2022 0.66 0.57 - 1.00 mg/dL Final         Passed - Valid encounter within last 12 months    Recent Outpatient Visits           1 month ago Poorly controlled type 2 diabetes mellitus (HCC)   [place] [facility] [place], [place] T, NP   4 months ago Poorly controlled type 2 diabetes mellitus (HCC)   [place] [facility] [place], Corrie Dandy T, NP   6 months ago Subacute cough   [place] [facility] [place], Catharine T, NP   7 months ago Influenza A   [place] [facility] [place], [place] T, NP   7 months ago Poorly controlled type 2 diabetes mellitus (HCC)   [place] [facility] [place], Dorie Rank, NP       Future Appointments             In 3 days Cannady, Dorie Rank, NP [place] [facility]   In 1 month [place], [place] T, NP [place] [facility]             traZODone (DESYREL) 50 MG tablet [Pharmacy Med Name: TRAZODONE 50MG  TABLETS] 30 tablet 3    Sig: TAKE 1/2 TO 1 TABLET(25 TO 50 MG) BY MOUTH AT BEDTIME AS NEEDED FOR SLEEP     Psychiatry: Antidepressants - Serotonin Modulator Passed - 10/12/2022  7:02 AM      Passed - Valid encounter within last 6 months    Recent Outpatient Visits           1 month ago Poorly controlled type 2 diabetes mellitus (HCC)   [place] [facility] [place], [place] T, NP   4 months ago Poorly  controlled type 2 diabetes mellitus (HCC)   [place] [facility] [place], Corrie Dandy T, NP   6 months ago Subacute cough   [place] [facility] [place], [place] T, NP   7 months ago Influenza A   [facility] [place], [place] T, NP   7 months ago Poorly controlled type 2 diabetes mellitus (HCC)   [place] [facility] [place], Dorie Rank, NP       Future Appointments             In 3 days Cannady, Dorie Rank, NP [place] [facility]   In 1 month [place], Dorie Rank, NP [place] [facility]

## 2022-10-17 ENCOUNTER — Encounter: Payer: Self-pay | Admitting: Nurse Practitioner

## 2022-10-17 ENCOUNTER — Ambulatory Visit (INDEPENDENT_AMBULATORY_CARE_PROVIDER_SITE_OTHER): Payer: 59 | Admitting: Nurse Practitioner

## 2022-10-17 VITALS — BP 125/75 | HR 87 | Temp 97.9°F | Wt 161.2 lb

## 2022-10-17 DIAGNOSIS — R8281 Pyuria: Secondary | ICD-10-CM | POA: Diagnosis not present

## 2022-10-17 DIAGNOSIS — L989 Disorder of the skin and subcutaneous tissue, unspecified: Secondary | ICD-10-CM

## 2022-10-17 DIAGNOSIS — R42 Dizziness and giddiness: Secondary | ICD-10-CM | POA: Diagnosis not present

## 2022-10-17 DIAGNOSIS — R1084 Generalized abdominal pain: Secondary | ICD-10-CM

## 2022-10-17 DIAGNOSIS — Z23 Encounter for immunization: Secondary | ICD-10-CM

## 2022-10-17 LAB — URINALYSIS, ROUTINE W REFLEX MICROSCOPIC
Bilirubin, UA: NEGATIVE
Glucose, UA: NEGATIVE
Ketones, UA: NEGATIVE
Nitrite, UA: NEGATIVE
Protein,UA: NEGATIVE
RBC, UA: NEGATIVE
Specific Gravity, UA: 1.02 (ref 1.005–1.030)
Urobilinogen, Ur: 1 mg/dL (ref 0.2–1.0)
pH, UA: 7 (ref 5.0–7.5)

## 2022-10-17 LAB — MICROSCOPIC EXAMINATION: Bacteria, UA: NONE SEEN

## 2022-10-17 LAB — WET PREP FOR TRICH, YEAST, CLUE
Clue Cell Exam: POSITIVE — AB
Trichomonas Exam: NEGATIVE
Yeast Exam: NEGATIVE

## 2022-10-17 MED ORDER — METRONIDAZOLE 500 MG PO TABS: 500.0000 mg | ORAL_TABLET | Freq: Two times a day (BID) | ORAL | 0 refills | Status: AC

## 2022-10-17 NOTE — Progress Notes (Signed)
Please call patient and let her know that her urine overall returned fairly normal, but her vaginal swab did show what I expected.  You have bacterial vaginosis, this is not sexually transmitted -- it is a build up of our normal bacteria in the vagina that keeps everything healthy.  I suspect this is where your symptoms are coming from, especially belly pain.  I am sending in Flagyl for you to take twice a day for 7 days.  You can take with food and I recommend using probiotic yogurt while taking to prevent belly upset.  Any questions?

## 2022-10-17 NOTE — Assessment & Plan Note (Signed)
Acute, but improving -- still having episodes at night.  UA noted some leuks, will send for culture.  Wet prep + clue cells - remainder negative.  Flagyl 500 MG BID for 7 days sent.  Educated her on this and side effects.  Suspect this is cause of discomfort as recently became sexually active again, she is aware this is not sexually transmitted.  Will obtain labs today as well for further assessment due to her history: CBC, CMP, Amylase, Lipase.

## 2022-10-17 NOTE — Progress Notes (Signed)
BP 125/75   Pulse 87   Temp 97.9 F (36.6 C) (Oral)   Wt 161 lb 3.2 oz (73.1 kg)   LMP  (LMP Unknown) Comment: age 72  SpO2 96%   BMI 26.83 kg/m    Subjective:    Patient ID: Cheryl Mendez, female    DOB: 1950-07-17, 72 y.o.   MRN: [id number]  HPI: Cheryl Mendez is a 72 y.o. female  Chief Complaint  Patient presents with   Abdominal Pain    Patient says she has had the pain for the past three weeks. Patient says she is not sure if it is here Rybelsus prescription, as it tends to happen to her. Patient says she hasn't had any issues recently with her Rybelsus in a while.    Skin Problem   ABDOMINAL PAIN  Started 3 weeks ago with abdominal pain, has a history of this with higher doses of Rybelsus -- currently taking 7 MG. It has improved, but still bothers her at night -- she does not feel like this is related to medication as this is different pain.  Gets up and takes a stomach pill -- .  Omeprazole, is to take 40 MG, but reports she is only taking 20 MG and sometimes a little more.  When initially had pain she had vomiting and diarrhea.  Recently started being sexually active again.  Does occasionally get dizzy with changing position and while swimming. Duration:weeks Onset: sudden Severity: 5/10 Quality: dull, aching, and throbbing Location:  diffuse  Episode duration: hours Radiation: no Frequency: intermittent Alleviating factors: Omeprazole -- recently had to take 3 of them Aggravating factors: unknown Status: improving Treatments attempted: Omeprazole Fever:  thinking she had a little Nausea: yes initially Vomiting: yes initially Weight loss: no Decreased appetite: yes Diarrhea: yes initially Constipation: occasional Blood in stool: no Heartburn: no Jaundice: no Rash: no Dysuria/urinary frequency: no Hematuria: no Recurrent NSAID use: no   SKIN INFECTION She is concerned for skin infection to right knee were lesion removed by dermatology. Reports this  has improved now. Duration: weeks Redness: yes Swelling: no Oozing: no Pus: no Fevers: no Nausea/vomiting:  as above Status: stable Treatments attempted: abx ointment   Relevant past medical, surgical, family and social history reviewed and updated as indicated. Interim medical history since our last visit reviewed. Allergies and medications reviewed and updated.  Review of Systems  Constitutional:  Negative for activity change, appetite change, diaphoresis, fatigue and fever.  Respiratory:  Negative for cough, chest tightness, shortness of breath and wheezing.   Cardiovascular:  Negative for chest pain, palpitations and leg swelling.  Gastrointestinal:  Positive for abdominal pain, nausea and vomiting. Negative for abdominal distention, constipation and diarrhea.  Neurological:  Positive for dizziness (occasional with changing position). Negative for syncope, weakness, light-headedness, numbness and headaches.  Psychiatric/Behavioral: Negative.      Per HPI unless specifically indicated above     Objective:    BP 125/75   Pulse 87   Temp 97.9 F (36.6 C) (Oral)   Wt 161 lb 3.2 oz (73.1 kg)   LMP  (LMP Unknown) Comment: age 82  SpO2 96%   BMI 26.83 kg/m   Wt Readings from Last 3 Encounters:  10/17/22 161 lb 3.2 oz (73.1 kg)  08/21/22 161 lb 9.6 oz (73.3 kg)  05/20/22 159 lb 9.6 oz (72.4 kg)    Physical Exam Vitals and nursing note reviewed.  Constitutional:      General: She is awake. She  is not in acute distress.    Appearance: She is well-developed and well-groomed. She is not ill-appearing or toxic-appearing.  HENT:     Head: Normocephalic.     Right Ear: Hearing and external ear normal.     Left Ear: Hearing and external ear normal.  Eyes:     General: Lids are normal.        Right eye: No discharge.        Left eye: No discharge.     Conjunctiva/sclera: Conjunctivae normal.     Pupils: Pupils are equal, round, and reactive to light.  Neck:     Thyroid:  No thyromegaly.     Vascular: No carotid bruit.  Cardiovascular:     Rate and Rhythm: Normal rate and regular rhythm.     Heart sounds: Normal heart sounds. No murmur heard.    No gallop.  Pulmonary:     Effort: Pulmonary effort is normal. No accessory muscle usage or respiratory distress.     Breath sounds: Normal breath sounds. No decreased breath sounds, wheezing or rhonchi.  Abdominal:     General: Bowel sounds are normal. There is no distension.     Palpations: Abdomen is soft. There is no hepatomegaly.     Tenderness: There is generalized abdominal tenderness and tenderness in the suprapubic area. There is no right CVA tenderness or left CVA tenderness.     Hernia: No hernia is present.  Musculoskeletal:     Cervical back: Normal range of motion and neck supple.     Right lower leg: No edema.     Left lower leg: No edema.  Lymphadenopathy:     Cervical: No cervical adenopathy.  Skin:    General: Skin is warm and dry.       Neurological:     Mental Status: She is alert and oriented to person, place, and time.     Deep Tendon Reflexes: Reflexes are normal and symmetric.     Reflex Scores:      Brachioradialis reflexes are 2+ on the right side and 2+ on the left side.      Patellar reflexes are 2+ on the right side and 2+ on the left side. Psychiatric:        Attention and Perception: Attention normal.        Mood and Affect: Mood normal.        Speech: Speech normal.        Behavior: Behavior normal. Behavior is cooperative.        Thought Content: Thought content normal.    Results for orders placed or performed in visit on 10/17/22  WET PREP FOR TRICH, YEAST, CLUE   Specimen: Urine   Urine  Result Value Ref Range   Trichomonas Exam Negative Negative   Yeast Exam Negative Negative   Clue Cell Exam Positive (A) Negative  Microscopic Examination   Urine  Result Value Ref Range   WBC, UA 6-10 (A) 0 - 5 /hpf   RBC, Urine 0-2 0 - 2 /hpf   Epithelial Cells (non  renal) 0-10 0 - 10 /hpf   Mucus, UA Present (A) Not Estab.   Bacteria, UA None seen None seen/Few  Urinalysis, Routine w reflex microscopic  Result Value Ref Range   Specific Gravity, UA 1.020 1.005 - 1.030   pH, UA 7.0 5.0 - 7.5   Color, UA Yellow Yellow   Appearance Ur Cloudy (A) Clear   Leukocytes,UA 1+ (A) Negative   Protein,UA  Negative Negative/Trace   Glucose, UA Negative Negative   Ketones, UA Negative Negative   RBC, UA Negative Negative   Bilirubin, UA Negative Negative   Urobilinogen, Ur 1.0 0.2 - 1.0 mg/dL   Nitrite, UA Negative Negative   Microscopic Examination See below:       Assessment & Plan:   Problem List Items Addressed This Visit       Other   Generalized abdominal pain - Primary    Acute, but improving -- still having episodes at night.  UA noted some leuks, will send for culture.  Wet prep + clue cells - remainder negative.  Flagyl 500 MG BID for 7 days sent.  Educated her on this and side effects.  Suspect this is cause of discomfort as recently became sexually active again, she is aware this is not sexually transmitted.  Will obtain labs today as well for further assessment due to her history: CBC, CMP, Amylase, Lipase.      Relevant Orders   WET PREP FOR TRICH, YEAST, CLUE (Completed)   Urinalysis, Routine w reflex microscopic (Completed)   Comprehensive metabolic panel   CBC with Differential/Platelet   Amylase   Lipase   Other Visit Diagnoses     Skin lesion       Area of lesion removal to right knee area has healed.   Dizziness       She is returning in one week, suspect orthostatic changes.  May need to reduce BP medications.   Pyuria       Urine sent for culture   Relevant Orders   Urine Culture   Flu vaccine need       High dose flu vaccine in office today per patient request.   Relevant Orders   Flu Vaccine Trivalent High Dose (Fluad) (Completed)        Follow up plan: Return in about 1 week (around 10/24/2022) for ABOMINAL PAIN  & ORTHOSTATIC DIZZINESS.

## 2022-10-18 LAB — COMPREHENSIVE METABOLIC PANEL
ALT: 16 IU/L (ref 0–32)
AST: 18 IU/L (ref 0–40)
Albumin: 4.9 g/dL — ABNORMAL HIGH (ref 3.8–4.8)
Alkaline Phosphatase: 45 IU/L (ref 44–121)
BUN/Creatinine Ratio: 21 (ref 12–28)
BUN: 16 mg/dL (ref 8–27)
Bilirubin Total: 0.6 mg/dL (ref 0.0–1.2)
CO2: 28 mmol/L (ref 20–29)
Calcium: 10.1 mg/dL (ref 8.7–10.3)
Chloride: 94 mmol/L — ABNORMAL LOW (ref 96–106)
Creatinine, Ser: 0.75 mg/dL (ref 0.57–1.00)
Globulin, Total: 2.6 g/dL (ref 1.5–4.5)
Glucose: 128 mg/dL — ABNORMAL HIGH (ref 70–99)
Potassium: 3.7 mmol/L (ref 3.5–5.2)
Sodium: 139 mmol/L (ref 134–144)
Total Protein: 7.5 g/dL (ref 6.0–8.5)
eGFR: 85 mL/min/{1.73_m2} (ref >59)

## 2022-10-18 LAB — CBC WITH DIFFERENTIAL/PLATELET
Basophils Absolute: 0 10*3/uL (ref 0.0–0.2)
Basos: 1 %
EOS (ABSOLUTE): 0.1 10*3/uL (ref 0.0–0.4)
Eos: 2 %
Hematocrit: 42.2 % (ref 34.0–46.6)
Hemoglobin: 14 g/dL (ref 11.1–15.9)
Immature Grans (Abs): 0 10*3/uL (ref 0.0–0.1)
Immature Granulocytes: 0 %
Lymphocytes Absolute: 2.3 10*3/uL (ref 0.7–3.1)
Lymphs: 35 %
MCH: 29.4 pg (ref 26.6–33.0)
MCHC: 33.2 g/dL (ref 31.5–35.7)
MCV: 89 fL (ref 79–97)
Monocytes Absolute: 0.5 10*3/uL (ref 0.1–0.9)
Monocytes: 7 %
Neutrophils Absolute: 3.6 10*3/uL (ref 1.4–7.0)
Neutrophils: 55 %
Platelets: 358 10*3/uL (ref 150–450)
RBC: 4.77 x10E6/uL (ref 3.77–5.28)
RDW: 12.9 % (ref 11.7–15.4)
WBC: 6.5 10*3/uL (ref 3.4–10.8)

## 2022-10-18 LAB — AMYLASE: Amylase: 50 U/L (ref 31–110)

## 2022-10-18 LAB — LIPASE: Lipase: 53 U/L (ref 14–85)

## 2022-10-18 NOTE — Progress Notes (Signed)
Good morning, please let Jaelyne know her labs have returned and these are overall stable with normal liver and pancreas levels.  CBC not showing elevations concerning for infection.  Suspect your pain was more from the bacterial vaginosis, so continued Flagyl until completed.  Any questions?  Have a wonderful day!!

## 2022-10-19 LAB — URINE CULTURE

## 2022-10-21 NOTE — Patient Instructions (Signed)

## 2022-10-22 NOTE — Progress Notes (Signed)
Routed note from patient to provider.

## 2022-10-24 ENCOUNTER — Ambulatory Visit (INDEPENDENT_AMBULATORY_CARE_PROVIDER_SITE_OTHER): Payer: 59 | Admitting: Nurse Practitioner

## 2022-10-24 ENCOUNTER — Encounter: Payer: Self-pay | Admitting: Nurse Practitioner

## 2022-10-24 VITALS — BP 124/73 | HR 89 | Temp 98.0°F | Ht 65.0 in | Wt 164.6 lb

## 2022-10-24 DIAGNOSIS — R1084 Generalized abdominal pain: Secondary | ICD-10-CM

## 2022-10-24 NOTE — Progress Notes (Signed)
BP 124/73   Pulse 89   Temp 98 F (36.7 C) (Oral)   Ht 5\' 5"  (1.651 m)   Wt 164 lb 9.6 oz (74.7 kg)   LMP  (LMP Unknown) Comment: age 72  SpO2 99%   BMI 27.39 kg/m    Subjective:    Patient ID: Cheryl Mendez, female    DOB: December 29, 1950, 72 y.o.   MRN: [id number]  HPI: Cheryl Mendez is a 72 y.o. female  Chief Complaint  Patient presents with   Abdominal Pain    1 week f/up- pt states her pain has gotten better from last week    ABDOMINAL PAIN  Follow-up for abdominal pain, was seen last week for this and wet prep did return noting BV present, started on Flagyl for this.  She reports pain is better at this time.  Did recently start becoming sexually active again.  Overall reports symptoms improved now.   Fever: no Nausea: no Vomiting: no Weight loss: no Decreased appetite: no Diarrhea: no Constipation: no Blood in stool: no Heartburn: no Jaundice: no Rash: no Dysuria/urinary frequency: no Hematuria: no  Relevant past medical, surgical, family and social history reviewed and updated as indicated. Interim medical history since our last visit reviewed. Allergies and medications reviewed and updated.  Review of Systems  Constitutional:  Negative for activity change, appetite change, diaphoresis, fatigue and fever.  Respiratory:  Negative for cough, chest tightness, shortness of breath and wheezing.   Cardiovascular:  Negative for chest pain, palpitations and leg swelling.  Gastrointestinal:  Negative for abdominal distention, abdominal pain, constipation, diarrhea, nausea and vomiting.  Neurological:  Negative for dizziness, syncope, weakness, light-headedness, numbness and headaches.  Psychiatric/Behavioral: Negative.      Per HPI unless specifically indicated above     Objective:    BP 124/73   Pulse 89   Temp 98 F (36.7 C) (Oral)   Ht 5\' 5"  (1.651 m)   Wt 164 lb 9.6 oz (74.7 kg)   LMP  (LMP Unknown) Comment: age 23  SpO2 99%   BMI 27.39 kg/m   Wt  Readings from Last 3 Encounters:  10/24/22 164 lb 9.6 oz (74.7 kg)  10/17/22 161 lb 3.2 oz (73.1 kg)  08/21/22 161 lb 9.6 oz (73.3 kg)    Physical Exam Vitals and nursing note reviewed.  Constitutional:      General: She is awake. She is not in acute distress.    Appearance: She is well-developed and well-groomed. She is not ill-appearing or toxic-appearing.  HENT:     Head: Normocephalic.     Right Ear: Hearing and external ear normal.     Left Ear: Hearing and external ear normal.  Eyes:     General: Lids are normal.        Right eye: No discharge.        Left eye: No discharge.     Conjunctiva/sclera: Conjunctivae normal.     Pupils: Pupils are equal, round, and reactive to light.  Neck:     Thyroid: No thyromegaly.     Vascular: No carotid bruit.  Cardiovascular:     Rate and Rhythm: Normal rate and regular rhythm.     Heart sounds: Normal heart sounds. No murmur heard.    No gallop.  Pulmonary:     Effort: Pulmonary effort is normal. No accessory muscle usage or respiratory distress.     Breath sounds: Normal breath sounds.  Abdominal:     General: Bowel sounds  are normal. There is no distension.     Palpations: Abdomen is soft. There is no hepatomegaly.     Tenderness: There is no abdominal tenderness.  Musculoskeletal:     Cervical back: Normal range of motion and neck supple.     Right lower leg: No edema.     Left lower leg: No edema.  Lymphadenopathy:     Cervical: No cervical adenopathy.  Skin:    General: Skin is warm and dry.  Neurological:     Mental Status: She is alert and oriented to person, place, and time.     Deep Tendon Reflexes: Reflexes are normal and symmetric.     Reflex Scores:      Brachioradialis reflexes are 2+ on the right side and 2+ on the left side.      Patellar reflexes are 2+ on the right side and 2+ on the left side. Psychiatric:        Attention and Perception: Attention normal.        Mood and Affect: Mood normal.         Speech: Speech normal.        Behavior: Behavior normal. Behavior is cooperative.        Thought Content: Thought content normal.    Results for orders placed or performed in visit on 10/17/22  WET PREP FOR TRICH, YEAST, CLUE   Specimen: Urine   Urine  Result Value Ref Range   Trichomonas Exam Negative Negative   Yeast Exam Negative Negative   Clue Cell Exam Positive (A) Negative  Urine Culture   Specimen: Urine   UR  Result Value Ref Range   Urine Culture, Routine Final report    Organism ID, Bacteria Comment   Microscopic Examination   Urine  Result Value Ref Range   WBC, UA 6-10 (A) 0 - 5 /hpf   RBC, Urine 0-2 0 - 2 /hpf   Epithelial Cells (non renal) 0-10 0 - 10 /hpf   Mucus, UA Present (A) Not Estab.   Bacteria, UA None seen None seen/Few  Urinalysis, Routine w reflex microscopic  Result Value Ref Range   Specific Gravity, UA 1.020 1.005 - 1.030   pH, UA 7.0 5.0 - 7.5   Color, UA Yellow Yellow   Appearance Ur Cloudy (A) Clear   Leukocytes,UA 1+ (A) Negative   Protein,UA Negative Negative/Trace   Glucose, UA Negative Negative   Ketones, UA Negative Negative   RBC, UA Negative Negative   Bilirubin, UA Negative Negative   Urobilinogen, Ur 1.0 0.2 - 1.0 mg/dL   Nitrite, UA Negative Negative   Microscopic Examination See below:   Comprehensive metabolic panel  Result Value Ref Range   Glucose 128 (H) 70 - 99 mg/dL   BUN 16 8 - 27 mg/dL   Creatinine, Ser 0.27 0.57 - 1.00 mg/dL   eGFR 85 >25 DG/UYQ/0.34   BUN/Creatinine Ratio 21 12 - 28   Sodium 139 134 - 144 mmol/L   Potassium 3.7 3.5 - 5.2 mmol/L   Chloride 94 (L) 96 - 106 mmol/L   CO2 28 20 - 29 mmol/L   Calcium 10.1 8.7 - 10.3 mg/dL   Total Protein 7.5 6.0 - 8.5 g/dL   Albumin 4.9 (H) 3.8 - 4.8 g/dL   Globulin, Total 2.6 1.5 - 4.5 g/dL   Bilirubin Total 0.6 0.0 - 1.2 mg/dL   Alkaline Phosphatase 45 44 - 121 IU/L   AST 18 0 - 40 IU/L   ALT  16 0 - 32 IU/L  CBC with Differential/Platelet  Result Value Ref  Range   WBC 6.5 3.4 - 10.8 x10E3/uL   RBC 4.77 3.77 - 5.28 x10E6/uL   Hemoglobin 14.0 11.1 - 15.9 g/dL   Hematocrit 41.3 24.4 - 46.6 %   MCV 89 79 - 97 fL   MCH 29.4 26.6 - 33.0 pg   MCHC 33.2 31.5 - 35.7 g/dL   RDW 01.0 27.2 - 53.6 %   Platelets 358 150 - 450 x10E3/uL   Neutrophils 55 Not Estab. %   Lymphs 35 Not Estab. %   Monocytes 7 Not Estab. %   Eos 2 Not Estab. %   Basos 1 Not Estab. %   Neutrophils Absolute 3.6 1.4 - 7.0 x10E3/uL   Lymphocytes Absolute 2.3 0.7 - 3.1 x10E3/uL   Monocytes Absolute 0.5 0.1 - 0.9 x10E3/uL   EOS (ABSOLUTE) 0.1 0.0 - 0.4 x10E3/uL   Basophils Absolute 0.0 0.0 - 0.2 x10E3/uL   Immature Granulocytes 0 Not Estab. %   Immature Grans (Abs) 0.0 0.0 - 0.1 x10E3/uL  Amylase  Result Value Ref Range   Amylase 50 31 - 110 U/L  Lipase  Result Value Ref Range   Lipase 53 14 - 85 U/L      Assessment & Plan:   Problem List Items Addressed This Visit       Other   Generalized abdominal pain - Primary    Acute and improved at this time.  Continue Flagyl until complete due to bacterial vaginosis.  Educated her on this diagnosis and discussed return to being sexually active.  Recommend she use lubrication when sexually active.        Follow up plan: Return for as scheduled October 3rd + needs Medicare wellness with nurse scheduled for after 11/02/22.

## 2022-10-24 NOTE — Assessment & Plan Note (Signed)
Acute and improved at this time.  Continue Flagyl until complete due to bacterial vaginosis.  Educated her on this diagnosis and discussed return to being sexually active.  Recommend she use lubrication when sexually active.

## 2022-11-01 ENCOUNTER — Ambulatory Visit (INDEPENDENT_AMBULATORY_CARE_PROVIDER_SITE_OTHER): Payer: 59 | Admitting: Nurse Practitioner

## 2022-11-01 ENCOUNTER — Encounter (INDEPENDENT_AMBULATORY_CARE_PROVIDER_SITE_OTHER): Payer: Self-pay | Admitting: Nurse Practitioner

## 2022-11-01 VITALS — BP 129/83 | HR 94 | Resp 16 | Wt 164.2 lb

## 2022-11-01 DIAGNOSIS — E1169 Type 2 diabetes mellitus with other specified complication: Secondary | ICD-10-CM

## 2022-11-01 DIAGNOSIS — E785 Hyperlipidemia, unspecified: Secondary | ICD-10-CM | POA: Diagnosis not present

## 2022-11-01 DIAGNOSIS — I83812 Varicose veins of left lower extremities with pain: Secondary | ICD-10-CM

## 2022-11-01 DIAGNOSIS — E1165 Type 2 diabetes mellitus with hyperglycemia: Secondary | ICD-10-CM

## 2022-11-04 ENCOUNTER — Encounter (INDEPENDENT_AMBULATORY_CARE_PROVIDER_SITE_OTHER): Payer: Self-pay | Admitting: Nurse Practitioner

## 2022-11-04 NOTE — Progress Notes (Signed)
Subjective:    Patient ID: Cheryl Mendez, female    DOB: [birth year], 72 y.o.   MRN: [id number] Chief Complaint  Patient presents with   Follow-up    6 month follow up    Today the patient returns regarding her bilateral lower extremity varicosities.  She notes that she has some leg pain and it is more so claudication in his description.  This includes pain with walking somewhat as well as worsening pain and discomfort during the evening.  She denies any open wounds or ulcerations.  She continues to have varicosities but no edema, open wounds or ulcerations.    Review of Systems  Musculoskeletal:  Positive for arthralgias and back pain.  Skin:  Negative for wound.  All other systems reviewed and are negative.      Objective:   Physical Exam Vitals reviewed.  HENT:     Head: Normocephalic.  Cardiovascular:     Rate and Rhythm: Normal rate.     Pulses: Normal pulses.  Pulmonary:     Effort: Pulmonary effort is normal.  Skin:    General: Skin is warm and dry.  Neurological:     Mental Status: She is alert and oriented to person, place, and time.  Psychiatric:        Mood and Affect: Mood normal.        Behavior: Behavior normal.        Thought Content: Thought content normal.        Judgment: Judgment normal.     BP 129/83 (BP Location: Right Arm)   Pulse 94   Resp 16   Wt 164 lb 3.2 oz (74.5 kg)   LMP  (LMP Unknown) Comment: age 50  BMI 27.32 kg/m   Past Medical History:  Diagnosis Date   Allergy    Arthritis    Diabetes mellitus without complication (HCC)    GERD (gastroesophageal reflux disease)    Hyperlipidemia    Hypertension    Lumbago    Migraine     Social History   Socioeconomic History   Marital status: Widowed    Spouse name: Not on file   Number of children: Not on file   Years of education: Not on file   Highest education level: Not on file  Occupational History   Not on file  Tobacco Use   Smoking status: Never   Smokeless  tobacco: Never  Vaping Use   Vaping status: Never Used  Substance and Sexual Activity   Alcohol use: No    Alcohol/week: 0.0 standard drinks of alcohol   Drug use: No   Sexual activity: Never  Other Topics Concern   Not on file  Social History Narrative   Not on file   Social Determinants of Health   Financial Resource Strain: Low Risk  (11/01/2021)   Overall Financial Resource Strain (CARDIA)    Difficulty of Paying Living Expenses: Not hard at all  Food Insecurity: No Food Insecurity (11/01/2021)   Hunger Vital Sign    Worried About Running Out of Food in the Last Year: Never true    Ran Out of Food in the Last Year: Never true  Transportation Needs: No Transportation Needs (10/31/2020)   PRAPARE - Administrator, Civil Service (Medical): No    Lack of Transportation (Non-Medical): No  Physical Activity: Inactive (11/01/2021)   Exercise Vital Sign    Days of Exercise per Week: 0 days    Minutes of Exercise per  Session: 0 min  Stress: No Stress Concern Present (11/01/2021)   [employer] of Occupational Health - Occupational Stress Questionnaire    Feeling of Stress : Not at all  Social Connections: Unknown (11/01/2021)   Social Connection and Isolation Panel [NHANES]    Frequency of Communication with Friends and Family: More than three times a week    Frequency of Social Gatherings with Friends and Family: Twice a week    Attends Religious Services: 1 to 4 times per year    Active Member of [employer] or Organizations: No    Attends Banker Meetings: Never    Marital Status: Not on file  Intimate Partner Violence: Not At Risk (11/01/2021)   Humiliation, Afraid, Rape, and Kick questionnaire    Fear of Current or Ex-Partner: No    Emotionally Abused: No    Physically Abused: No    Sexually Abused: No    Past Surgical History:  Procedure Laterality Date   COLONOSCOPY WITH PROPOFOL N/A 06/25/2016   Procedure: COLONOSCOPY WITH PROPOFOL;  Surgeon: Wyline Mood, MD;  Location: [facility] ENDOSCOPY;  Service: Endoscopy;  Laterality: N/A;   ESOPHAGOGASTRODUODENOSCOPY (EGD) WITH PROPOFOL N/A 06/25/2016   Procedure: ESOPHAGOGASTRODUODENOSCOPY (EGD) WITH PROPOFOL;  Surgeon: Wyline Mood, MD;  Location: [facility] ENDOSCOPY;  Service: Endoscopy;  Laterality: N/A;   NO PAST SURGERIES      Family History  Problem Relation Age of Onset   Diabetes Mother    Heart disease Father    Hypertension Father    Eczema Sister    COPD Sister     Allergies  Allergen Reactions   Penicillin G Benzathine        Latest Ref Rng & Units 10/17/2022   11:30 AM 02/25/2022    9:39 AM 02/12/2022    7:14 PM  CBC  WBC 3.4 - 10.8 x10E3/uL 6.5  5.8  4.4   Hemoglobin 11.1 - 15.9 g/dL 40.9  81.1  91.4   Hematocrit 34.0 - 46.6 % 42.2  37.3  40.5   Platelets 150 - 450 x10E3/uL 358  491  299       CMP     Component Value Date/Time   NA 139 10/17/2022 1130   NA 136 06/09/2014 1620   K 3.7 10/17/2022 1130   K 3.3 (L) 06/09/2014 1620   CL 94 (L) 10/17/2022 1130   CL 95 (L) 06/09/2014 1620   CO2 28 10/17/2022 1130   CO2 32 06/09/2014 1620   GLUCOSE 128 (H) 10/17/2022 1130   GLUCOSE 132 (H) 02/12/2022 1914   GLUCOSE 269 (H) 06/09/2014 1620   BUN 16 10/17/2022 1130   BUN 19 06/09/2014 1620   CREATININE 0.75 10/17/2022 1130   CREATININE 0.73 06/09/2014 1620   CALCIUM 10.1 10/17/2022 1130   CALCIUM 9.6 06/09/2014 1620   PROT 7.5 10/17/2022 1130   PROT 8.4 (H) 06/09/2014 1620   ALBUMIN 4.9 (H) 10/17/2022 1130   ALBUMIN 4.5 06/09/2014 1620   AST 18 10/17/2022 1130   AST 105 (H) 06/09/2014 1620   ALT 16 10/17/2022 1130   ALT 103 (H) 06/09/2014 1620   ALKPHOS 45 10/17/2022 1130   ALKPHOS 52 06/09/2014 1620   BILITOT 0.6 10/17/2022 1130   BILITOT 0.6 06/09/2014 1620   EGFR 85 10/17/2022 1130   GFRNONAA >60 02/12/2022 1914   GFRNONAA >60 06/09/2014 1620     No results found.     Assessment & Plan:   1. Varicose veins of left lower extremity with pain  We had a  discussion regarding the patient's discomfort in her lower extremities.  Based on her description it sounds as if she is having claudication-like symptoms.  I suspect this is actually more so neurogenic claudication but I have offered her noninvasive studies regarding her arterial just to ensure that she does not have any nonnoted arterial disease.  She would like to discuss with her PCP and following her discussion she will call us to let us know if she wishes to follow-up.  In regards to the patient's varicosities, I discussed symptoms which would make sense for ablation including tenderness of the veins heaviness, itching or burning and stinging in the area of the veins.  Additionally with treatment of the vein we can also treat some of the superficial varicosities she has.  This will also be under consideration by the patient.  2. Hyperlipidemia associated with type 2 diabetes mellitus (HCC) Continue statin as ordered and reviewed, no changes at this time  3. Poorly controlled type 2 diabetes mellitus (HCC) Continue hypoglycemic medications as already ordered, these medications have been reviewed and there are no changes at this time.  Hgb A1C to be monitored as already arranged by primary service   Current Outpatient Medications on File Prior to Visit  Medication Sig Dispense Refill   albuterol (VENTOLIN HFA) 108 (90 Base) MCG/ACT inhaler INHALE 2 PUFFS INTO THE LUNGS EVERY 6 HOURS AS NEEDED FOR WHEEZING OR SHORTNESS OF BREATH 18 g 3   aspirin 81 MG chewable tablet Chew 81 mg by mouth daily.     atorvastatin (LIPITOR) 20 MG tablet Take 1 tablet (20 mg total) by mouth daily. 90 tablet 4   benazepril-hydrochlorthiazide (LOTENSIN HCT) 20-25 MG tablet Take 1 tablet by mouth daily. 90 tablet 4   Blood Glucose Monitoring Suppl (ONE TOUCH ULTRA 2) w/Device KIT Use to check blood sugar 3 times a day and document results, bring to appointments. Goal is <130 fasting blood sugar and <180 two hours after  meals 1 kit 1   fluticasone (FLONASE) 50 MCG/ACT nasal spray Place 2 sprays into both nostrils daily. 16 g 6   loratadine (CLARITIN) 10 MG tablet TAKE 1 TABLET(10 MG) BY MOUTH DAILY 90 tablet 1   meclizine (ANTIVERT) 12.5 MG tablet Take 1 tablet (12.5 mg total) by mouth 3 (three) times daily as needed for dizziness. 30 tablet 0   metFORMIN (GLUCOPHAGE) 1000 MG tablet TAKE 1 TABLET(1000 MG) BY MOUTH TWICE DAILY WITH A MEAL 180 tablet 4   omeprazole (PRILOSEC) 20 MG capsule Take 2 capsules (40 mg total) by mouth daily. 180 capsule 4   ondansetron (ZOFRAN-ODT) 4 MG disintegrating tablet Take 1 tablet (4 mg total) by mouth every 6 (six) hours as needed for nausea or vomiting. 40 tablet 0   ONETOUCH DELICA LANCETS FINE MISC USE UP TO FOUR TIMES DAILY AS DIRECTED 100 each 0   ONETOUCH ULTRA test strip TEST FOUR TIMES DAILY 300 strip 1   Semaglutide (RYBELSUS) 7 MG TABS Take 1 tablet (7 mg total) by mouth daily. 30 tablet 12   sennosides-docusate sodium (SENOKOT-S) 8.6-50 MG tablet Take 1 tablet by mouth 2 (two) times daily. 60 tablet 12   tiZANidine (ZANAFLEX) 4 MG tablet Take 1 tablet (4 mg total) by mouth every 6 (six) hours as needed for muscle spasms. 30 tablet 12   traZODone (DESYREL) 100 MG tablet Take 1 tablet (100 mg total) by mouth at bedtime. 90 tablet 4   No current facility-administered medications on  file prior to visit.    There are no Patient Instructions on file for this visit. No follow-ups on file.   Georgiana Spinner, NP

## 2022-11-07 DIAGNOSIS — L821 Other seborrheic keratosis: Secondary | ICD-10-CM | POA: Diagnosis not present

## 2022-11-07 DIAGNOSIS — L298 Other pruritus: Secondary | ICD-10-CM | POA: Diagnosis not present

## 2022-11-08 ENCOUNTER — Ambulatory Visit: Payer: Self-pay | Admitting: *Deleted

## 2022-11-08 NOTE — Telephone Encounter (Signed)
Summary: blurry vision, arm pain   Patient states that she had some spots removed from her face yesterday and today she is having trouble focusing her eyes and has some pain in her right arm. Patient states that they had to numb multiple places on her face. Please advise.     Attempted to call patient- no answer- mail box is full- unable to leave call back message

## 2022-11-08 NOTE — Telephone Encounter (Signed)
      Chief Complaint: Had "places removed from my face yesterday and today in the dermatology office. My right cheek and forehead. She had to numb me." "I had blurred vision right eye and weakness afterward. They kept me awhile and watched me. I'm better now at home." Wants to get PCP advice. Symptoms: "I'm better now, but I want to know what Jolene thinks." Frequency: Yesterday and today. Pertinent Negatives: Patient denies  Disposition: [] ED /[] Urgent Care (no appt availability in office) / [] Appointment(In office/virtual)/ []   Virtual Care/ [] Home Care/ [] Refused Recommended Disposition /[]  Mobile Bus/ [x]  Follow-up with PCP Additional Notes: Please advise pt. States "if I need to I'll go to ED."  Reason for Disposition  [1] Blurred vision or visual changes AND [2] gradual onset (e.g., weeks, months)  Answer Assessment - Initial Assessment Questions 1. DESCRIPTION: "How has your vision changed?" (e.g., complete vision loss, blurred vision, double vision, floaters, etc.)     Blurred vision 2. LOCATION: "One or both eyes?" If one, ask: "Which eye?"     Right eye 3. SEVERITY: "Can you see anything?" If Yes, ask: "What can you see?" (e.g., fine print)     Can't read things 4. ONSET: "When did this begin?" "Did it start suddenly or has this been gradual?"     Yesterday 5. PATTERN: "Does this come and go, or has it been constant since it started?"     Constant 6. PAIN: "Is there any pain in your eye(s)?"  (Scale 1-10; or mild, moderate, severe)   - NONE (0): No pain.   - MILD (1-3): Doesn't interfere with normal activities.   - MODERATE (4-7): Interferes with normal activities or awakens from sleep.    - SEVERE (8-10): Excruciating pain, unable to do any normal activities.     None 7. CONTACTS-GLASSES: "Do you wear contacts or glasses?"     Glasses 8. CAUSE: "What do you think is causing this visual problem?"     The shots they put in my face to numb 9. OTHER  SYMPTOMS: "Do you have any other symptoms?" (e.g., confusion, headache, arm or leg weakness, speech problems)     Feels weak 10. PREGNANCY: "Is there any chance you are pregnant?" "When was your last menstrual period?"       No  Protocols used: Vision Loss or Change-A-AH

## 2022-11-11 NOTE — Telephone Encounter (Signed)
Called and spoke with patient. Advised patient of Jolene's message. Patient verbalized understanding and states she is doing better.

## 2022-11-17 NOTE — Patient Instructions (Incomplete)

## 2022-11-19 ENCOUNTER — Ambulatory Visit: Payer: 59 | Admitting: Emergency Medicine

## 2022-11-19 VITALS — Ht 65.0 in | Wt 162.0 lb

## 2022-11-19 DIAGNOSIS — Z0000 Encounter for general adult medical examination without abnormal findings: Secondary | ICD-10-CM

## 2022-11-19 DIAGNOSIS — Z780 Asymptomatic menopausal state: Secondary | ICD-10-CM | POA: Diagnosis not present

## 2022-11-19 DIAGNOSIS — Z1211 Encounter for screening for malignant neoplasm of colon: Secondary | ICD-10-CM

## 2022-11-19 NOTE — Progress Notes (Signed)
Subjective:   Cheryl Mendez is a 72 y.o. female who presents for Medicare Annual (Subsequent) preventive examination.  Visit Complete: Virtual  I connected with  Cheryl Mendez on 11/19/22 by a audio enabled telemedicine application and verified that I am speaking with the correct person using two identifiers.  Patient Location: Home  Provider Location: Office/Clinic  I discussed the limitations of evaluation and management by telemedicine. The patient expressed understanding and agreed to proceed.  Because this visit was a virtual/telehealth visit, some criteria may be missing or patient reported. Any vitals not documented were not able to be obtained and vitals that have been documented are patient reported.    Cardiac Risk Factors include: advanced age (>39men, >46 women);diabetes mellitus;hypertension;dyslipidemia     Objective:    Today's Vitals   11/19/22 1507  Weight: 162 lb (73.5 kg)  Height: 5\' 5"  (1.651 m)   Body mass index is 26.96 kg/m.     11/19/2022    3:23 PM 02/12/2022    7:27 PM 11/01/2021   12:37 PM 10/31/2020    5:58 PM 06/25/2016    9:26 AM 05/06/2016    8:10 AM  Advanced Directives  Does Patient Have a Medical Advance Directive? No No No Yes;No No No  Does patient want to make changes to medical advance directive?    No - Patient declined    Would patient like information on creating a medical advance directive? Yes (MAU/Ambulatory/Procedural Areas - Information given)  No - Patient declined No - Patient declined No - Patient declined Yes (MAU/Ambulatory/Procedural Areas - Information given)    Current Medications (verified) Outpatient Encounter Medications as of 11/19/2022  Medication Sig   aspirin 81 MG chewable tablet Chew 81 mg by mouth daily.   atorvastatin (LIPITOR) 20 MG tablet Take 1 tablet (20 mg total) by mouth daily.   benazepril-hydrochlorthiazide (LOTENSIN HCT) 20-25 MG tablet Take 1 tablet by mouth daily.   Blood Glucose Monitoring  Suppl (ONE TOUCH ULTRA 2) w/Device KIT Use to check blood sugar 3 times a day and document results, bring to appointments. Goal is <130 fasting blood sugar and <180 two hours after meals   fluticasone (FLONASE) 50 MCG/ACT nasal spray Place 2 sprays into both nostrils daily.   linaclotide (LINZESS) 72 MCG capsule Take 72 mcg by mouth daily as needed. 4 days per week. Got from a friend   loratadine (CLARITIN) 10 MG tablet TAKE 1 TABLET(10 MG) BY MOUTH DAILY   meclizine (ANTIVERT) 12.5 MG tablet Take 1 tablet (12.5 mg total) by mouth 3 (three) times daily as needed for dizziness.   metFORMIN (GLUCOPHAGE) 1000 MG tablet TAKE 1 TABLET(1000 MG) BY MOUTH TWICE DAILY WITH A MEAL   omeprazole (PRILOSEC) 20 MG capsule Take 2 capsules (40 mg total) by mouth daily.   ondansetron (ZOFRAN-ODT) 4 MG disintegrating tablet Take 1 tablet (4 mg total) by mouth every 6 (six) hours as needed for nausea or vomiting.   ONETOUCH DELICA LANCETS FINE MISC USE UP TO FOUR TIMES DAILY AS DIRECTED   ONETOUCH ULTRA test strip TEST FOUR TIMES DAILY   Semaglutide (RYBELSUS) 7 MG TABS Take 1 tablet (7 mg total) by mouth daily.   tiZANidine (ZANAFLEX) 4 MG tablet Take 1 tablet (4 mg total) by mouth every 6 (six) hours as needed for muscle spasms.   traZODone (DESYREL) 100 MG tablet Take 1 tablet (100 mg total) by mouth at bedtime.   albuterol (VENTOLIN HFA) 108 (90 Base) MCG/ACT inhaler INHALE  2 PUFFS INTO THE LUNGS EVERY 6 HOURS AS NEEDED FOR WHEEZING OR SHORTNESS OF BREATH (Patient not taking: Reported on 11/19/2022)   sennosides-docusate sodium (SENOKOT-S) 8.6-50 MG tablet Take 1 tablet by mouth 2 (two) times daily. (Patient not taking: Reported on 11/19/2022)   No facility-administered encounter medications on file as of 11/19/2022.    Allergies (verified) Penicillin g benzathine   History: Past Medical History:  Diagnosis Date   Allergy    Arthritis    Diabetes mellitus without complication (HCC)    GERD  (gastroesophageal reflux disease)    Hyperlipidemia    Hypertension    Lumbago    Migraine    Past Surgical History:  Procedure Laterality Date   COLONOSCOPY WITH PROPOFOL N/A 06/25/2016   Procedure: COLONOSCOPY WITH PROPOFOL;  Surgeon: Wyline Mood, MD;  Location: [facility] ENDOSCOPY;  Service: Endoscopy;  Laterality: N/A;   ESOPHAGOGASTRODUODENOSCOPY (EGD) WITH PROPOFOL N/A 06/25/2016   Procedure: ESOPHAGOGASTRODUODENOSCOPY (EGD) WITH PROPOFOL;  Surgeon: Wyline Mood, MD;  Location: [facility] ENDOSCOPY;  Service: Endoscopy;  Laterality: N/A;   NO PAST SURGERIES     Family History  Problem Relation Age of Onset   Diabetes Mother    Heart disease Father    Hypertension Father    Eczema Sister    COPD Sister    Social History   Socioeconomic History   Marital status: Widowed    Spouse name: Not on file   Number of children: 0   Years of education: Not on file   Highest education level: Not on file  Occupational History   Occupation: retired  Tobacco Use   Smoking status: Never   Smokeless tobacco: Never  Vaping Use   Vaping status: Never Used  Substance and Sexual Activity   Alcohol use: No    Alcohol/week: 0.0 standard drinks of alcohol   Drug use: No   Sexual activity: Never  Other Topics Concern   Not on file  Social History Narrative   Not on file   Social Determinants of Health   Financial Resource Strain: Low Risk  (11/19/2022)   Overall Financial Resource Strain (CARDIA)    Difficulty of Paying Living Expenses: Not hard at all  Food Insecurity: No Food Insecurity (11/19/2022)   Hunger Vital Sign    Worried About Running Out of Food in the Last Year: Never true    Ran Out of Food in the Last Year: Never true  Transportation Needs: No Transportation Needs (11/19/2022)   PRAPARE - Administrator, Civil Service (Medical): No    Lack of Transportation (Non-Medical): No  Physical Activity: Inactive (11/19/2022)   Exercise Vital Sign    Days of Exercise per Week: 0  days    Minutes of Exercise per Session: 0 min  Stress: No Stress Concern Present (11/19/2022)   [employer] of Occupational Health - Occupational Stress Questionnaire    Feeling of Stress : Not at all  Social Connections: Socially Isolated (11/19/2022)   Social Connection and Isolation Panel [NHANES]    Frequency of Communication with Friends and Family: More than three times a week    Frequency of Social Gatherings with Friends and Family: Three times a week    Attends Religious Services: Never    Active Member of Clubs or Organizations: No    Attends Banker Meetings: Never    Marital Status: Widowed    Tobacco Counseling Counseling given: Not Answered   Clinical Intake:  Pre-visit preparation completed: Yes  Pain :  No/denies pain     BMI - recorded: 26.96 Nutritional Status: BMI 25 -29 Overweight Nutritional Risks: None Diabetes: Yes CBG done?: No Did pt. bring in CBG monitor from home?: No  How often do you need to have someone help you when you read instructions, pamphlets, or other written materials from your doctor or pharmacy?: 1 - Never  Interpreter Needed?: No  Information entered by :: Tora Kindred, CMA   Activities of Daily Living    11/19/2022    3:11 PM  In your present state of health, do you have any difficulty performing the following activities:  Hearing? 0  Vision? 0  Difficulty concentrating or making decisions? 0  Walking or climbing stairs? 1  Comment arthritis  Dressing or bathing? 0  Doing errands, shopping? 0  Preparing Food and eating ? N  Using the Toilet? N  In the past six months, have you accidently leaked urine? N  Do you have problems with loss of bowel control? N  Managing your Medications? N  Managing your Finances? N  Housekeeping or managing your Housekeeping? N    Patient Care Team: Marjie Skiff, NP as PCP - General (Nurse Practitioner) Lajean Manes, [facility] (Inactive) (Pharmacist) Irene Limbo., MD (Ophthalmology)  Indicate any recent Medical Services you may have received from other than [facility] providers in the past year (date may be approximate).     Assessment:   This is a routine wellness examination for Hudsyn.  Hearing/Vision screen Hearing Screening - Comments:: Denies hearing loss Vision Screening - Comments:: Gets routine eye exams   Goals Addressed               This Visit's Progress     Patient Stated (pt-stated)        Maintain current health, and go back to the [facility]      Depression Screen    11/19/2022    3:21 PM 10/17/2022   10:43 AM 08/21/2022    9:47 AM 05/20/2022    9:58 AM 02/25/2022    9:35 AM 11/01/2021   10:16 AM 06/19/2021    2:44 PM  PHQ 2/9 Scores  PHQ - 2 Score 0 0 0 0 0 0 0  PHQ- 9 Score 0 4 2 1  0 0 1    Fall Risk    11/19/2022    3:25 PM 10/17/2022   10:42 AM 08/21/2022    9:46 AM 05/20/2022    9:58 AM 03/11/2022   10:04 AM  Fall Risk   Falls in the past year? 1 1 1 1  0  Number falls in past yr: 1 1 1 1  0  Injury with Fall? 0 0 0 0 0  Risk for fall due to : History of fall(s);Orthopedic patient History of fall(s) History of fall(s) History of fall(s) No Fall Risks  Follow up Education provided;Falls prevention discussed;Falls evaluation completed Falls evaluation completed Falls evaluation completed Falls evaluation completed Falls evaluation completed    MEDICARE RISK AT HOME: Medicare Risk at Home Any stairs in or around the home?: Yes If so, are there any without handrails?: No Home free of loose throw rugs in walkways, pet beds, electrical cords, etc?: Yes Adequate lighting in your home to reduce risk of falls?: Yes Life alert?: No Use of a cane, walker or w/c?: No Grab bars in the bathroom?: Yes Shower chair or bench in shower?: No Elevated toilet seat or a handicapped toilet?: No  TIMED UP AND GO:  Was the  test performed?  No    Cognitive Function:        11/19/2022    3:27 PM 11/01/2021   12:51 PM 10/31/2020    6:01  PM  6CIT Screen  What Year? 0 points 0 points 0 points  What month? 0 points 0 points 0 points  What time? 0 points 0 points 0 points  Count back from 20 0 points 0 points 0 points  Months in reverse 0 points 0 points 0 points  Repeat phrase 0 points 0 points 10 points  Total Score 0 points 0 points 10 points    Immunizations Immunization History  Administered Date(s) Administered   Fluad Quad(high Dose 65+) 03/29/2019, 11/25/2019, 11/22/2020   Fluad Trivalent(High Dose 65+) 10/17/2022   Influenza, High Dose Seasonal PF 01/19/2016, 11/08/2016, 11/07/2017, 11/04/2021   Influenza-Unspecified 11/07/2014   Moderna Sars-Covid-2 Vaccination 04/20/2020, 05/22/2020   PNEUMOCOCCAL CONJUGATE-20 11/04/2021   Pneumococcal Conjugate-13 10/17/2015   Pneumococcal Polysaccharide-23 09/03/2011, 05/07/2017   Td 11/01/2021   Tdap 05/08/2010   Zoster, Live 03/25/2014    TDAP status: Up to date  Flu Vaccine status: Up to date  Pneumococcal vaccine status: Up to date  Covid-19 vaccine status: Declined, Education has been provided regarding the importance of this vaccine but patient still declined. Advised may receive this vaccine at local pharmacy or Health Dept.or vaccine clinic. Aware to provide a copy of the vaccination record if obtained from local pharmacy or Health Dept. Verbalized acceptance and understanding.  Qualifies for Shingles Vaccine? Yes   Zostavax completed Yes   Shingrix Completed?: No.    Education has been provided regarding the importance of this vaccine. Patient has been advised to call insurance company to determine out of pocket expense if they have not yet received this vaccine. Advised may also receive vaccine at local pharmacy or Health Dept. Verbalized acceptance and understanding.  Screening Tests Health Maintenance  Topic Date Due   DEXA SCAN  06/01/2015   OPHTHALMOLOGY EXAM  01/25/2022   COVID-19 Vaccine (3 - 2023-24 season) 10/20/2022   Zoster Vaccines- Shingrix  (1 of 2) 11/21/2022 (Originally 05/31/2000)   Colonoscopy  08/21/2023 (Originally 06/26/2019)   HEMOGLOBIN A1C  02/21/2023   Diabetic kidney evaluation - Urine ACR  05/20/2023   FOOT EXAM  08/21/2023   Diabetic kidney evaluation - eGFR measurement  10/17/2023   Medicare Annual Wellness (AWV)  11/21/2023   MAMMOGRAM  07/07/2024   DTaP/Tdap/Td (3 - Td or Tdap) 11/02/2031   Pneumonia Vaccine 39+ Years old  Completed   INFLUENZA VACCINE  Completed   Hepatitis C Screening  Addressed   HPV VACCINES  Aged Out    Health Maintenance  Health Maintenance Due  Topic Date Due   DEXA SCAN  06/01/2015   OPHTHALMOLOGY EXAM  01/25/2022   COVID-19 Vaccine (3 - 2023-24 season) 10/20/2022    Colorectal cancer screening: Type of screening: Colonoscopy. Completed 06/25/16. Repeat every 3 years  Mammogram status: Completed 06/2022. Repeat every year. Repeat every 2 years.  Bone Density status: Ordered 11/19/22. Pt provided with contact info and advised to call to schedule appt.  Lung Cancer Screening: (Low Dose CT Chest recommended if Age 32-80 years, 20 pack-year currently smoking OR have quit w/in 15years.) does not qualify.   Lung Cancer Screening Referral: n/a  Additional Screening:  Hepatitis C Screening: does not qualify; Completed 02/19/05  Vision Screening: Recommended annual ophthalmology exams for early detection of glaucoma and other disorders of the eye. Is the patient up to  date with their annual eye exam?  Yes , records requested. Who is the provider or what is the name of the office in which the patient attends annual eye exams? Dr. Hulen Luster If pt is not established with a provider, would they like to be referred to a provider to establish care? No .   Dental Screening: Recommended annual dental exams for proper oral hygiene  Diabetic Foot Exam: Diabetic Foot Exam: Completed 08/21/22  Community Resource Referral / Chronic Care Management: CRR required this visit?  No   CCM required  this visit?  No     Plan:     I have personally reviewed and noted the following in the patient's chart:   Medical and social history Use of alcohol, tobacco or illicit drugs  Current medications and supplements including opioid prescriptions. Patient is not currently taking opioid prescriptions. Functional ability and status Nutritional status Physical activity Advanced directives List of other physicians Hospitalizations, surgeries, and ER visits in previous 12 months Vitals Screenings to include cognitive, depression, and falls Referrals and appointments  In addition, I have reviewed and discussed with patient certain preventive protocols, quality metrics, and best practice recommendations. A written personalized care plan for preventive services as well as general preventive health recommendations were provided to patient.     Tora Kindred, CMA   11/19/2022   After Visit Summary: (Pick Up) Due to this being a telephonic visit, with patients personalized plan was offered to patient and patient has requested to Pick up at office. Will pick up at [facility] 11/21/22  Nurse Notes:  Referral placed to GI for colonoscopy Order placed for DEXA scan Declined covid vaccine Needs shingles vaccine

## 2022-11-19 NOTE — Patient Instructions (Addendum)
Cheryl Mendez , Thank you for taking time to come for your Medicare Wellness Visit. I appreciate your ongoing commitment to your health goals. Please review the following plan we discussed and let me know if I can assist you in the future.   Referrals/Orders/Follow-Ups/Clinician Recommendations: I have placed a referral to GI for a colonoscopy. Someone should call you to get that scheduled. I placed an order for a bone density test. Call [facility] @ [phone] at your earliest convenience. Recommend getting the shingles vaccine at your local pharmacy at your earliest convenience.  This is a list of the screening recommended for you and due dates:  Health Maintenance  Topic Date Due   DEXA scan (bone density measurement)  06/01/2015   Eye exam for diabetics  01/25/2022   COVID-19 Vaccine (3 - 2023-24 season) 10/20/2022   Zoster (Shingles) Vaccine (1 of 2) 11/21/2022*   Colon Cancer Screening  08/21/2023*   Hemoglobin A1C  02/21/2023   Yearly kidney health urinalysis for diabetes  05/20/2023   Complete foot exam   08/21/2023   Yearly kidney function blood test for diabetes  10/17/2023   Medicare Annual Wellness Visit  11/21/2023   Mammogram  07/07/2024   DTaP/Tdap/Td vaccine (3 - Td or Tdap) 11/02/2031   Pneumonia Vaccine  Completed   Flu Shot  Completed   Hepatitis C Screening  Addressed   HPV Vaccine  Aged Out  *Topic was postponed. The date shown is not the original due date.    Advanced directives: (ACP Link)Information on Advanced Care Planning can be found at [facility] Advance Health Care Directives Advance Health Care Directives ([email or web address]   Please bring a copy of your health care power of attorney and living will to the office to be added to your chart at your convenience.   Next Medicare Annual Wellness Visit scheduled for next year: Yes, 12/02/23 @ 4:15pm  Fall Prevention in the Home, Adult Falls can cause injuries and affect people of  all ages. There are many simple things that you can do to make your home safe and to help prevent falls. If you need it, ask for help making these changes. What actions can I take to prevent falls? General information Use good lighting in all rooms. Make sure to: Replace any light bulbs that burn out. Turn on lights if it is dark and use night-lights. Keep items that you use often in easy-to-reach places. Lower the shelves around your home if needed. Move furniture so that there are clear paths around it. Do not keep throw rugs or other things on the floor that can make you trip. If any of your floors are uneven, fix them. Add color or contrast paint or tape to clearly mark and help you see: Grab bars or handrails. First and last steps of staircases. Where the edge of each step is. If you use a ladder or stepladder: Make sure that it is fully opened. Do not climb a closed ladder. Make sure the sides of the ladder are locked in place. Have someone hold the ladder while you use it. Know where your pets are as you move through your home. What can I do in the bathroom?     Keep the floor dry. Clean up any water that is on the floor right away. Remove soap buildup in the bathtub or shower. Buildup makes bathtubs and showers slippery. Use non-skid mats or decals on the floor of the bathtub or shower. Attach  bath mats securely with double-sided, non-slip rug tape. If you need to sit down while you are in the shower, use a non-slip stool. Install grab bars by the toilet and in the bathtub and shower. Do not use towel bars as grab bars. What can I do in the bedroom? Make sure that you have a light by your bed that is easy to reach. Do not use any sheets or blankets on your bed that hang to the floor. Have a firm bench or chair with side arms that you can use for support when you get dressed. What can I do in the kitchen? Clean up any spills right away. If you need to reach something above  you, use a sturdy step stool that has a grab bar. Keep electrical cables out of the way. Do not use floor polish or wax that makes floors slippery. What can I do with my stairs? Do not leave anything on the stairs. Make sure that you have a light switch at the top and the bottom of the stairs. Have them installed if you do not have them. Make sure that there are handrails on both sides of the stairs. Fix handrails that are broken or loose. Make sure that handrails are as long as the staircases. Install non-slip stair treads on all stairs in your home if they do not have carpet. Avoid having throw rugs at the top or bottom of stairs, or secure the rugs with carpet tape to prevent them from moving. Choose a carpet design that does not hide the edge of steps on the stairs. Make sure that carpet is firmly attached to the stairs. Fix any carpet that is loose or worn. What can I do on the outside of my home? Use bright outdoor lighting. Repair the edges of walkways and driveways and fix any cracks. Clear paths of anything that can make you trip, such as tools or rocks. Add color or contrast paint or tape to clearly mark and help you see high doorway thresholds. Trim any bushes or trees on the main path into your home. Check that handrails are securely fastened and in good repair. Both sides of all steps should have handrails. Install guardrails along the edges of any raised decks or porches. Have leaves, snow, and ice cleared regularly. Use sand, salt, or ice melt on walkways during winter months if you live where there is ice and snow. In the garage, clean up any spills right away, including grease or oil spills. What other actions can I take? Review your medicines with your health care provider. Some medicines can make you confused or feel dizzy. This can increase your chance of falling. Wear closed-toe shoes that fit well and support your feet. Wear shoes that have rubber soles and low heels. Use  a cane, walker, scooter, or crutches that help you move around if needed. Talk with your provider about other ways that you can decrease your risk of falls. This may include seeing a physical therapist to learn to do exercises to improve movement and strength. Where to find more information Centers for Disease Control and Prevention, STEADI: TonerPromos.no [employer] on Aging: BaseRingTones.pl National Institute on Aging: BaseRingTones.pl Contact a health care provider if: You are afraid of falling at home. You feel weak, drowsy, or dizzy at home. You fall at home. Get help right away if you: Lose consciousness or have trouble moving after a fall. Have a fall that causes a head injury. These symptoms may be  an emergency. Get help right away. Call 911. Do not wait to see if the symptoms will go away. Do not drive yourself to the hospital. This information is not intended to replace advice given to you by your health care provider. Make sure you discuss any questions you have with your health care provider. Document Revised: 10/08/2021 Document Reviewed: 10/08/2021 Elsevier Patient Education  2024 [employer].

## 2022-11-21 ENCOUNTER — Ambulatory Visit: Payer: 59 | Admitting: Nurse Practitioner

## 2022-11-21 ENCOUNTER — Encounter: Payer: Self-pay | Admitting: Nurse Practitioner

## 2022-11-21 ENCOUNTER — Telehealth: Payer: Self-pay | Admitting: Nurse Practitioner

## 2022-11-21 ENCOUNTER — Telehealth: Payer: Self-pay

## 2022-11-21 ENCOUNTER — Other Ambulatory Visit: Payer: Self-pay

## 2022-11-21 VITALS — BP 122/78 | HR 85 | Temp 97.9°F | Ht 65.0 in | Wt 161.4 lb

## 2022-11-21 DIAGNOSIS — I152 Hypertension secondary to endocrine disorders: Secondary | ICD-10-CM | POA: Diagnosis not present

## 2022-11-21 DIAGNOSIS — E1169 Type 2 diabetes mellitus with other specified complication: Secondary | ICD-10-CM | POA: Diagnosis not present

## 2022-11-21 DIAGNOSIS — E1165 Type 2 diabetes mellitus with hyperglycemia: Secondary | ICD-10-CM

## 2022-11-21 DIAGNOSIS — E538 Deficiency of other specified B group vitamins: Secondary | ICD-10-CM | POA: Diagnosis not present

## 2022-11-21 DIAGNOSIS — Z860100 Personal history of colon polyps, unspecified: Secondary | ICD-10-CM

## 2022-11-21 DIAGNOSIS — F5101 Primary insomnia: Secondary | ICD-10-CM | POA: Diagnosis not present

## 2022-11-21 DIAGNOSIS — E1159 Type 2 diabetes mellitus with other circulatory complications: Secondary | ICD-10-CM

## 2022-11-21 DIAGNOSIS — Z1211 Encounter for screening for malignant neoplasm of colon: Secondary | ICD-10-CM | POA: Diagnosis not present

## 2022-11-21 DIAGNOSIS — Z7984 Long term (current) use of oral hypoglycemic drugs: Secondary | ICD-10-CM

## 2022-11-21 DIAGNOSIS — E785 Hyperlipidemia, unspecified: Secondary | ICD-10-CM | POA: Diagnosis not present

## 2022-11-21 DIAGNOSIS — Z0000 Encounter for general adult medical examination without abnormal findings: Secondary | ICD-10-CM

## 2022-11-21 MED ORDER — NA SULFATE-K SULFATE-MG SULF 17.5-3.13-1.6 GM/177ML PO SOLN: 1.0000 | Freq: Once | ORAL | 0 refills | Status: AC

## 2022-11-21 NOTE — Assessment & Plan Note (Signed)
Chronic, ongoing.  No benefit from Melatonin or CBD.  Discussed with her and will continue Trazodone 100 MG nightly as needed.  Educated her on this medication and use + side effects.  Recommend heavy focus on sleep hygiene techniques, discussed with her.

## 2022-11-21 NOTE — Assessment & Plan Note (Signed)
Chronic, ongoing.  Continue current medication regimen and adjust as needed. Lipid panel today. 

## 2022-11-21 NOTE — Assessment & Plan Note (Signed)
Chronic, ongoing.  A1c 7.9% in July, trend up due to dietary indiscretions = recheck today. Urine ALB 18 June 2022 - on ACE. Continue Lotensin for kidney protection. - Recommend she take Metformin 1000 MG BID as ordered (not once daily) and Rybelsus 7 MG daily -- would like to avoid insulin. Did not tolerate SGLT2 or GLP1 injections in past. Continue diet changes at home. Recommend to monitor BS daily at home and document daily with fasting BS goal of <130.  Monitor diet closely with focus on diabetic diet and regular exercise. - Eye and Foot exam up to date - On ACE and statin therapy - Vaccinations up to date

## 2022-11-21 NOTE — Assessment & Plan Note (Signed)
Chronic, stable.  BP at goal in office today.  Continue current medication regimen and adjust as needed -- Lotensin offering kidney protection.  Recommend checking BP at home three mornings a week and documenting for provider review.  LABS: CMP.  Urine ALB 18 June 2022.  Return in 3 months.

## 2022-11-21 NOTE — Telephone Encounter (Signed)
When patient checked out she stated that she needed a letter for the apartment complex stating that she needs to stay on the ground floor  because of her knees. A letter was given last year. Please advise.

## 2022-11-21 NOTE — Assessment & Plan Note (Signed)
Noted low normal levels on labs in past.  Recommend she continue taking Vitamin B12 500 to 1000 MCG daily.  Recheck level today.  Explained to patient the benefit of annual B12 level testing while on daily Metformin.  Educated patient on the effect of Metformin on absorption of Vitamin B12 and symptoms of low B12 level, such as neuropathic pain.  "Reduced absorption of vitamin B12 is a known adverse effect of long-term use of Metformin" and "routine B12 monitoring may be considered in patients with poor dietary intake or absorption" Pollie Meyer, D. J., 2019 via UpToDate).

## 2022-11-21 NOTE — Progress Notes (Signed)
BP 122/78   Pulse 85   Temp 97.9 F (36.6 C) (Oral)   Ht 5\' 5"  (1.651 m)   Wt 161 lb 6.4 oz (73.2 kg)   LMP  (LMP Unknown) Comment: age 72  SpO2 98%   BMI 26.86 kg/m    Subjective:    Patient ID: Cheryl Mendez, female    DOB: 05-08-1950, 72 y.o.   MRN: [id number]  HPI: Cheryl Mendez is a 72 y.o. female  Chief Complaint  Patient presents with   Diabetes   Hyperlipidemia   Hypertension   Insomnia   Skin Problem    Patient says she went to have area cauterized off her face and says she was given four injections in her face to help with the area. Patient says since the injections in her face. Her vision has been blurry in the L eye. Patient says she can see perfectly fine out of her R eye.    DIABETES A1c 7.9% on 08/21/22.  Currently taking Rybelsus 7 MG (can not tolerate higher doses = elevation in pancreas levels) and Metformin 1000 MG once daily (tolerates daily dosing).  She feels the Rybelsus helps her appetite be less, has been losing weight. Taking B12 daily for deficiency.    Past history: Trulicity with GI issues and Invokana stopped due to foot concerns + Jardiance made her sick. Hypoglycemic episodes:no Polydipsia/polyuria: no Visual disturbance: no Chest pain: no Paresthesias: no Glucose Monitoring: no  Accucheck frequency: occasional  Fasting glucose: 140 Monday  Post prandial:  Evening:  Before meals: Taking Insulin?: no  Long acting insulin:  Short acting insulin: Blood Pressure Monitoring: not checking Retinal Examination: Not Up to Date - Dr. Alvester Morin (April 2024), has been having issues with vision since having injections in face with dermatology = left eye blurry, but right eye stable Foot Exam: Up to Date Pneumovax: Up to Date Influenza: Up to Date Aspirin: no   HYPERTENSION / HYPERLIPIDEMIA Currently taking Lotensin 20-25 and Atorvastatin 10 MG.  Satisfied with current treatment? yes Duration of hypertension: chronic BP monitoring frequency: not  checking BP range:  BP medication side effects: no Duration of hyperlipidemia: chronic Cholesterol medication side effects: no Cholesterol supplements: none Medication compliance: good compliance Aspirin: no Recent stressors: no Recurrent headaches: no Visual changes: no Palpitations: no Dyspnea: no Chest pain: no Lower extremity edema: no Dizzy/lightheaded: no The 10-year ASCVD risk score (Arnett DK, et al., 2019) is: 25.7%   Values used to calculate the score:     Age: 31 years     Sex: Female     Is Non-Hispanic African American: No     Diabetic: Yes     Tobacco smoker: No     Systolic Blood Pressure: 122 mmHg     Is BP treated: Yes     HDL Cholesterol: 41 mg/dL     Total Cholesterol: 176 mg/dL  INSOMNIA Taking Trazodone nightly, we increased to 100 MG last visit -- still can not sleep completely through night.  Tried Melatonin, made her swimmy headed.  Tried OTC CBD, did not offer much benefit.  Used to work 2nd shift in past.  Consistently wakes at 3 am due to this past history. Duration: years Satisfied with sleep quality: yes Difficulty falling asleep: yes Difficulty staying asleep: no Waking a few hours after sleep onset: no Early morning awakenings: no Daytime hypersomnolence: no Wakes feeling refreshed: yes Good sleep hygiene: yes Apnea: no Snoring: no Depressed/anxious mood: no Recent stress: no Restless  legs/nocturnal leg cramps: no Chronic pain/arthritis: yes History of sleep study: no Treatments attempted: melatonin, CBD, Trazodone  Relevant past medical, surgical, family and social history reviewed and updated as indicated. Interim medical history since our last visit reviewed. Allergies and medications reviewed and updated.  Review of Systems  Constitutional:  Negative for activity change, appetite change, diaphoresis, fatigue and fever.  HENT: Negative.    Eyes:  Positive for visual disturbance.  Respiratory:  Negative for cough, chest  tightness, shortness of breath and wheezing.   Cardiovascular:  Negative for chest pain, palpitations and leg swelling.  Gastrointestinal: Negative.   Endocrine: Negative for cold intolerance, heat intolerance, polydipsia, polyphagia and polyuria.  Neurological: Negative.   Psychiatric/Behavioral:  Positive for sleep disturbance. Negative for decreased concentration, self-injury and suicidal ideas. The patient is not nervous/anxious.    Per HPI unless specifically indicated above     Objective:    BP 122/78   Pulse 85   Temp 97.9 F (36.6 C) (Oral)   Ht 5\' 5"  (1.651 m)   Wt 161 lb 6.4 oz (73.2 kg)   LMP  (LMP Unknown) Comment: age 63  SpO2 98%   BMI 26.86 kg/m   Wt Readings from Last 3 Encounters:  11/21/22 161 lb 6.4 oz (73.2 kg)  11/19/22 162 lb (73.5 kg)  11/01/22 164 lb 3.2 oz (74.5 kg)    Physical Exam Vitals and nursing note reviewed.  Constitutional:      General: She is awake. She is not in acute distress.    Appearance: She is well-developed and well-groomed. She is not ill-appearing or toxic-appearing.  HENT:     Head: Normocephalic.     Right Ear: Hearing and external ear normal.     Left Ear: Hearing and external ear normal.  Eyes:     General: Lids are normal.        Right eye: No discharge.        Left eye: No discharge.     Extraocular Movements: Extraocular movements intact.     Conjunctiva/sclera: Conjunctivae normal.     Pupils: Pupils are equal, round, and reactive to light.     Visual Fields: Right eye visual fields normal and left eye visual fields normal.  Neck:     Thyroid: No thyromegaly.     Vascular: No carotid bruit.  Cardiovascular:     Rate and Rhythm: Normal rate and regular rhythm.     Heart sounds: Normal heart sounds. No murmur heard.    No gallop.  Pulmonary:     Effort: Pulmonary effort is normal. No accessory muscle usage or respiratory distress.     Breath sounds: Normal breath sounds.  Abdominal:     General: Bowel sounds  are normal. There is no distension.     Palpations: Abdomen is soft.     Tenderness: There is no abdominal tenderness.  Musculoskeletal:     Cervical back: Normal range of motion and neck supple.     Right lower leg: No edema.     Left lower leg: No edema.  Lymphadenopathy:     Cervical: No cervical adenopathy.  Skin:    General: Skin is warm and dry.  Neurological:     Mental Status: She is alert and oriented to person, place, and time.     Deep Tendon Reflexes: Reflexes are normal and symmetric.     Reflex Scores:      Brachioradialis reflexes are 2+ on the right side and 2+ on the  left side.      Patellar reflexes are 2+ on the right side and 2+ on the left side. Psychiatric:        Attention and Perception: Attention normal.        Mood and Affect: Mood normal.        Speech: Speech normal.        Behavior: Behavior normal. Behavior is cooperative.        Thought Content: Thought content normal.    Results for orders placed or performed in visit on 10/17/22  WET PREP FOR TRICH, YEAST, CLUE   Specimen: Urine   Urine  Result Value Ref Range   Trichomonas Exam Negative Negative   Yeast Exam Negative Negative   Clue Cell Exam Positive (A) Negative  Urine Culture   Specimen: Urine   UR  Result Value Ref Range   Urine Culture, Routine Final report    Organism ID, Bacteria Comment   Microscopic Examination   Urine  Result Value Ref Range   WBC, UA 6-10 (A) 0 - 5 /hpf   RBC, Urine 0-2 0 - 2 /hpf   Epithelial Cells (non renal) 0-10 0 - 10 /hpf   Mucus, UA Present (A) Not Estab.   Bacteria, UA None seen None seen/Few  Urinalysis, Routine w reflex microscopic  Result Value Ref Range   Specific Gravity, UA 1.020 1.005 - 1.030   pH, UA 7.0 5.0 - 7.5   Color, UA Yellow Yellow   Appearance Ur Cloudy (A) Clear   Leukocytes,UA 1+ (A) Negative   Protein,UA Negative Negative/Trace   Glucose, UA Negative Negative   Ketones, UA Negative Negative   RBC, UA Negative Negative    Bilirubin, UA Negative Negative   Urobilinogen, Ur 1.0 0.2 - 1.0 mg/dL   Nitrite, UA Negative Negative   Microscopic Examination See below:   Comprehensive metabolic panel  Result Value Ref Range   Glucose 128 (H) 70 - 99 mg/dL   BUN 16 8 - 27 mg/dL   Creatinine, Ser 7.84 0.57 - 1.00 mg/dL   eGFR 85 >69 GE/XBM/8.41   BUN/Creatinine Ratio 21 12 - 28   Sodium 139 134 - 144 mmol/L   Potassium 3.7 3.5 - 5.2 mmol/L   Chloride 94 (L) 96 - 106 mmol/L   CO2 28 20 - 29 mmol/L   Calcium 10.1 8.7 - 10.3 mg/dL   Total Protein 7.5 6.0 - 8.5 g/dL   Albumin 4.9 (H) 3.8 - 4.8 g/dL   Globulin, Total 2.6 1.5 - 4.5 g/dL   Bilirubin Total 0.6 0.0 - 1.2 mg/dL   Alkaline Phosphatase 45 44 - 121 IU/L   AST 18 0 - 40 IU/L   ALT 16 0 - 32 IU/L  CBC with Differential/Platelet  Result Value Ref Range   WBC 6.5 3.4 - 10.8 x10E3/uL   RBC 4.77 3.77 - 5.28 x10E6/uL   Hemoglobin 14.0 11.1 - 15.9 g/dL   Hematocrit 32.4 40.1 - 46.6 %   MCV 89 79 - 97 fL   MCH 29.4 26.6 - 33.0 pg   MCHC 33.2 31.5 - 35.7 g/dL   RDW 02.7 25.3 - 66.4 %   Platelets 358 150 - 450 x10E3/uL   Neutrophils 55 Not Estab. %   Lymphs 35 Not Estab. %   Monocytes 7 Not Estab. %   Eos 2 Not Estab. %   Basos 1 Not Estab. %   Neutrophils Absolute 3.6 1.4 - 7.0 x10E3/uL   Lymphocytes Absolute 2.3  0.7 - 3.1 x10E3/uL   Monocytes Absolute 0.5 0.1 - 0.9 x10E3/uL   EOS (ABSOLUTE) 0.1 0.0 - 0.4 x10E3/uL   Basophils Absolute 0.0 0.0 - 0.2 x10E3/uL   Immature Granulocytes 0 Not Estab. %   Immature Grans (Abs) 0.0 0.0 - 0.1 x10E3/uL  Amylase  Result Value Ref Range   Amylase 50 31 - 110 U/L  Lipase  Result Value Ref Range   Lipase 53 14 - 85 U/L      Assessment & Plan:   Problem List Items Addressed This Visit       Cardiovascular and Mediastinum   Hypertension associated with diabetes (HCC)    Chronic, stable.  BP at goal in office today.  Continue current medication regimen and adjust as needed -- Lotensin offering kidney  protection.  Recommend checking BP at home three mornings a week and documenting for provider review.  LABS: CMP.  Urine ALB 18 June 2022.  Return in 3 months.      Relevant Orders   Comprehensive metabolic panel   HgB A1c     Endocrine   Hyperlipidemia associated with type 2 diabetes mellitus (HCC)    Chronic, ongoing.  Continue current medication regimen and adjust as needed.  Lipid panel today.      Relevant Orders   Comprehensive metabolic panel   Lipid Panel w/o Chol/HDL Ratio   HgB A1c   Poorly controlled type 2 diabetes mellitus (HCC) - Primary    Chronic, ongoing.  A1c 7.9% in July, trend up due to dietary indiscretions = recheck today. Urine ALB 18 June 2022 - on ACE. Continue Lotensin for kidney protection. - Recommend she take Metformin 1000 MG BID as ordered (not once daily) and Rybelsus 7 MG daily -- would like to avoid insulin. Did not tolerate SGLT2 or GLP1 injections in past. Continue diet changes at home. Recommend to monitor BS daily at home and document daily with fasting BS goal of <130.  Monitor diet closely with focus on diabetic diet and regular exercise. - Eye and Foot exam up to date - On ACE and statin therapy - Vaccinations up to date      Relevant Orders   Comprehensive metabolic panel   HgB A1c     Other   B12 deficiency    Noted low normal levels on labs in past.  Recommend she continue taking Vitamin B12 500 to 1000 MCG daily.  Recheck level today.  Explained to patient the benefit of annual B12 level testing while on daily Metformin.  Educated patient on the effect of Metformin on absorption of Vitamin B12 and symptoms of low B12 level, such as neuropathic pain.  "Reduced absorption of vitamin B12 is a known adverse effect of long-term use of Metformin" and "routine B12 monitoring may be considered in patients with poor dietary intake or absorption" Izell [place], D. J., 2019 via UpToDate).         Relevant Orders   Vitamin B12   Insomnia    Chronic,  ongoing.  No benefit from Melatonin or CBD.  Discussed with her and will continue Trazodone 100 MG nightly as needed.  Educated her on this medication and use + side effects.  Recommend heavy focus on sleep hygiene techniques, discussed with her.      Other Visit Diagnoses     Colon cancer screening       GI referral placed.   Relevant Orders   Ambulatory referral to Gastroenterology  Follow up plan: Return in about 3 months (around 02/21/2023) for T2DM, HTN/hLD, INSOMNIA.

## 2022-11-21 NOTE — Telephone Encounter (Signed)
Gastroenterology Pre-Procedure Review  Request Date: 12/24/22 Requesting Physician: Dr. Tobi Bastos  PATIENT REVIEW QUESTIONS: The patient responded to the following health history questions as indicated:    1. Are you having any GI issues? no 2. Do you have a personal history of Polyps? yes (last colonoscopy 06/25/2016 recommended repeat 3 years) 3. Do you have a family history of Colon Cancer or Polyps? no 4. Diabetes Mellitus? yes (has been advised to stop rybelsus 7 days before colonoscopy. Stop metformin 2 days before colonoscopy) 5. Joint replacements in the past 12 months?no 6. Major health problems in the past 3 months?no 7. Any artificial heart valves, MVP, or defibrillator?no    MEDICATIONS & ALLERGIES:    Patient reports the following regarding taking any anticoagulation/antiplatelet therapy:   Plavix, Coumadin, Eliquis, Xarelto, Lovenox, Pradaxa, Brilinta, or Effient? no Aspirin? no  Patient confirms/reports the following medications:  Current Outpatient Medications  Medication Sig Dispense Refill   albuterol (VENTOLIN HFA) 108 (90 Base) MCG/ACT inhaler INHALE 2 PUFFS INTO THE LUNGS EVERY 6 HOURS AS NEEDED FOR WHEEZING OR SHORTNESS OF BREATH 18 g 3   aspirin 81 MG chewable tablet Chew 81 mg by mouth daily.     atorvastatin (LIPITOR) 20 MG tablet Take 1 tablet (20 mg total) by mouth daily. 90 tablet 4   benazepril-hydrochlorthiazide (LOTENSIN HCT) 20-25 MG tablet Take 1 tablet by mouth daily. 90 tablet 4   Blood Glucose Monitoring Suppl (ONE TOUCH ULTRA 2) w/Device KIT Use to check blood sugar 3 times a day and document results, bring to appointments. Goal is <130 fasting blood sugar and <180 two hours after meals 1 kit 1   fluticasone (FLONASE) 50 MCG/ACT nasal spray Place 2 sprays into both nostrils daily. 16 g 6   linaclotide (LINZESS) 72 MCG capsule Take 72 mcg by mouth daily as needed. 4 days per week. Got from a friend     loratadine (CLARITIN) 10 MG tablet TAKE 1 TABLET(10  MG) BY MOUTH DAILY 90 tablet 1   meclizine (ANTIVERT) 12.5 MG tablet Take 1 tablet (12.5 mg total) by mouth 3 (three) times daily as needed for dizziness. 30 tablet 0   metFORMIN (GLUCOPHAGE) 1000 MG tablet TAKE 1 TABLET(1000 MG) BY MOUTH TWICE DAILY WITH A MEAL 180 tablet 4   omeprazole (PRILOSEC) 20 MG capsule Take 2 capsules (40 mg total) by mouth daily. 180 capsule 4   ondansetron (ZOFRAN-ODT) 4 MG disintegrating tablet Take 1 tablet (4 mg total) by mouth every 6 (six) hours as needed for nausea or vomiting. 40 tablet 0   ONETOUCH DELICA LANCETS FINE MISC USE UP TO FOUR TIMES DAILY AS DIRECTED 100 each 0   ONETOUCH ULTRA test strip TEST FOUR TIMES DAILY 300 strip 1   Semaglutide (RYBELSUS) 7 MG TABS Take 1 tablet (7 mg total) by mouth daily. 30 tablet 12   sennosides-docusate sodium (SENOKOT-S) 8.6-50 MG tablet Take 1 tablet by mouth 2 (two) times daily. 60 tablet 12   tiZANidine (ZANAFLEX) 4 MG tablet Take 1 tablet (4 mg total) by mouth every 6 (six) hours as needed for muscle spasms. 30 tablet 12   traZODone (DESYREL) 100 MG tablet Take 1 tablet (100 mg total) by mouth at bedtime. 90 tablet 4   No current facility-administered medications for this visit.    Patient confirms/reports the following allergies:  Allergies  Allergen Reactions   Penicillin G Benzathine     No orders of the defined types were placed in this encounter.   AUTHORIZATION  INFORMATION Primary Insurance: 1D#: Group #:  Secondary Insurance: 1D#: Group #:  SCHEDULE INFORMATION: Date: 12/24/22 Time: Location: [facility]

## 2022-11-21 NOTE — Telephone Encounter (Signed)
I will print it and give patient a call to pick it up.

## 2022-11-22 ENCOUNTER — Other Ambulatory Visit: Payer: Self-pay | Admitting: Nurse Practitioner

## 2022-11-22 LAB — LIPID PANEL W/O CHOL/HDL RATIO
Cholesterol, Total: 187 mg/dL (ref 100–199)
HDL: 45 mg/dL (ref >39)
LDL Chol Calc (NIH): 108 mg/dL — ABNORMAL HIGH (ref 0–99)
Triglycerides: 197 mg/dL — ABNORMAL HIGH (ref 0–149)
VLDL Cholesterol Cal: 34 mg/dL (ref 5–40)

## 2022-11-22 LAB — COMPREHENSIVE METABOLIC PANEL
ALT: 16 [IU]/L (ref 0–32)
AST: 17 [IU]/L (ref 0–40)
Albumin: 4.6 g/dL (ref 3.8–4.8)
Alkaline Phosphatase: 55 [IU]/L (ref 44–121)
BUN/Creatinine Ratio: 18 (ref 12–28)
BUN: 13 mg/dL (ref 8–27)
Bilirubin Total: 0.5 mg/dL (ref 0.0–1.2)
CO2: 27 mmol/L (ref 20–29)
Calcium: 10 mg/dL (ref 8.7–10.3)
Chloride: 95 mmol/L — ABNORMAL LOW (ref 96–106)
Creatinine, Ser: 0.72 mg/dL (ref 0.57–1.00)
Globulin, Total: 2.8 g/dL (ref 1.5–4.5)
Glucose: 156 mg/dL — ABNORMAL HIGH (ref 70–99)
Potassium: 3.7 mmol/L (ref 3.5–5.2)
Sodium: 138 mmol/L (ref 134–144)
Total Protein: 7.4 g/dL (ref 6.0–8.5)
eGFR: 89 mL/min/{1.73_m2} (ref >59)

## 2022-11-22 LAB — HEMOGLOBIN A1C
Est. average glucose Bld gHb Est-mCnc: 171 mg/dL
Hgb A1c MFr Bld: 7.6 % — ABNORMAL HIGH (ref 4.8–5.6)

## 2022-11-22 LAB — VITAMIN B12: Vitamin B-12: 548 pg/mL (ref 232–1245)

## 2022-11-22 MED ORDER — ATORVASTATIN CALCIUM 40 MG PO TABS: 40.0000 mg | ORAL_TABLET | Freq: Every day | ORAL | 4 refills | Status: DC

## 2022-11-22 NOTE — Progress Notes (Signed)
Good morning, please let Matina know her labs have returned + needs 4 weeks follow-up visit - Cholesterol levels have increased this check, we need to increase your Atorvastatin to 40 MG daily.  I will send this in.   - A1c is trending down, but not quite at goal = 7.6%.  I am a little concerned about this since there are many medications we can not use due to side effects you have had.  I am going to send in a very small dose of Glipizide for you to take every morning, if this drops your sugar too much let me know.  I would like you to return in 4 weeks to see how you are tolerating. My staff will schedule this visit with you. - Kidney and liver function remain stable.  Any questions? Keep being amazing!!  Thank you for allowing me to participate in your care.  I appreciate you. Kindest regards, Alexandria Shiflett

## 2022-11-26 ENCOUNTER — Telehealth: Payer: Self-pay | Admitting: Nurse Practitioner

## 2022-11-26 NOTE — Telephone Encounter (Signed)
Called and spoke with patient, looked in her chart and advised which location her colonoscopy is and where it is located.  Also gave her the time that I seen and the telephone number to call, advised her to call and get the correct time they may want her to come in earlier.  She verbalized understanding.

## 2022-11-26 NOTE — Telephone Encounter (Signed)
Patient is calling in regards to her colonoscopy that was scheduled. Patient states it has been scheduled however her PCP asked her which location she wants to go to, and patient states she would like to go to the [facility] location but don't know how to get in contact with them. Please follow back up with patient @ # [phone] to make sure this is scheduled for 12/24/2022 at [facility].

## 2022-12-19 ENCOUNTER — Telehealth: Payer: Self-pay

## 2022-12-19 NOTE — Telephone Encounter (Signed)
Copay too high for patient.  She has requested to cancel 12/23/22 colonoscopy at this time.  Robyn in Endo notified.  Thanks,  [place], [place]

## 2022-12-19 NOTE — Telephone Encounter (Signed)
Pt requesting call back to  reschedule  procedure

## 2022-12-20 NOTE — Telephone Encounter (Signed)
okay

## 2022-12-22 NOTE — Patient Instructions (Signed)

## 2022-12-24 ENCOUNTER — Ambulatory Visit: Admission: RE | Admit: 2022-12-24 | Payer: 59 | Source: Home / Self Care | Admitting: Gastroenterology

## 2022-12-24 SURGERY — COLONOSCOPY WITH PROPOFOL
Anesthesia: General

## 2022-12-27 ENCOUNTER — Encounter: Payer: Self-pay | Admitting: Nurse Practitioner

## 2022-12-27 ENCOUNTER — Ambulatory Visit (INDEPENDENT_AMBULATORY_CARE_PROVIDER_SITE_OTHER): Payer: 59 | Admitting: Nurse Practitioner

## 2022-12-27 VITALS — BP 124/80 | HR 87 | Temp 98.0°F | Ht 65.0 in | Wt 163.4 lb

## 2022-12-27 DIAGNOSIS — M19011 Primary osteoarthritis, right shoulder: Secondary | ICD-10-CM

## 2022-12-27 MED ORDER — GLIPIZIDE 5 MG PO TABS: 2.5000 mg | ORAL_TABLET | Freq: Every day | ORAL | 4 refills | Status: DC

## 2022-12-27 NOTE — Addendum Note (Signed)
Addended by: Aura Dials T on: 12/27/2022 10:23 AM   Modules accepted: Orders

## 2022-12-27 NOTE — Addendum Note (Signed)
Addended by: Aura Dials T on: 12/27/2022 10:54 AM   Modules accepted: Level of Service

## 2022-12-27 NOTE — Progress Notes (Signed)
BP 124/80   Pulse 87   Temp 98 F (36.7 C) (Oral)   Ht 5\' 5"  (1.651 m)   Wt 163 lb 6.4 oz (74.1 kg)   LMP  (LMP Unknown) Comment: age 72  SpO2 100%   BMI 27.19 kg/m    Subjective:    Patient ID: Cheryl Mendez, female    DOB: 20-Feb-1950, 72 y.o.   MRN: [id number]  HPI: SIMRAN GRZYBEK is a 72 y.o. female  Chief Complaint  Patient presents with   Shoulder Pain   SHOULDER PAIN (RIGHT) Right shoulder has been hurting more for a few months, history of steroid injection to this shoulder.  Has known arthritis in this shoulder. Duration: months Involved shoulder: right Mechanism of injury: unknown Location: posterior Onset:gradual Severity: 6/10  Quality:  dull, aching, and throbbing Frequency: intermittent Radiation: no Aggravating factors: lifting and movement  Alleviating factors: APAP and rest  Status: worse Treatments attempted: rest, ice, and APAP  Relief with NSAIDs?:  No NSAIDs Taken Weakness: no Numbness: no Decreased grip strength: no Redness: no Swelling: no Bruising: no Fevers: no   Relevant past medical, surgical, family and social history reviewed and updated as indicated. Interim medical history since our last visit reviewed. Allergies and medications reviewed and updated.  Review of Systems  Constitutional:  Negative for activity change, appetite change, diaphoresis, fatigue and fever.  Respiratory:  Negative for cough, chest tightness, shortness of breath and wheezing.   Cardiovascular:  Negative for chest pain, palpitations and leg swelling.  Gastrointestinal: Negative.   Musculoskeletal:  Positive for arthralgias.  Neurological: Negative.   Psychiatric/Behavioral: Negative.      Per HPI unless specifically indicated above     Objective:    BP 124/80   Pulse 87   Temp 98 F (36.7 C) (Oral)   Ht 5\' 5"  (1.651 m)   Wt 163 lb 6.4 oz (74.1 kg)   LMP  (LMP Unknown) Comment: age 47  SpO2 100%   BMI 27.19 kg/m   Wt Readings from Last 3  Encounters:  12/27/22 163 lb 6.4 oz (74.1 kg)  11/21/22 161 lb 6.4 oz (73.2 kg)  11/19/22 162 lb (73.5 kg)    Physical Exam Vitals and nursing note reviewed.  Constitutional:      General: She is awake. She is not in acute distress.    Appearance: Normal appearance. She is well-developed and well-groomed. She is not ill-appearing or toxic-appearing.  HENT:     Head: Normocephalic.     Right Ear: Hearing and external ear normal.     Left Ear: Hearing and external ear normal.  Eyes:     General: Lids are normal.        Right eye: No discharge.        Left eye: No discharge.     Conjunctiva/sclera: Conjunctivae normal.     Pupils: Pupils are equal, round, and reactive to light.  Neck:     Thyroid: No thyromegaly.     Vascular: No carotid bruit.  Cardiovascular:     Rate and Rhythm: Normal rate and regular rhythm.     Heart sounds: Normal heart sounds. No murmur heard.    No gallop.  Pulmonary:     Effort: Pulmonary effort is normal. No accessory muscle usage or respiratory distress.     Breath sounds: Normal breath sounds.  Abdominal:     General: Bowel sounds are normal. There is no distension.     Palpations: Abdomen is soft.  Tenderness: There is no abdominal tenderness.  Musculoskeletal:     Right shoulder: Tenderness (posterior, upper, lateral aspect) and crepitus present. No swelling, effusion or bony tenderness. Decreased range of motion (some reduction in flexion). Normal strength. Normal pulse.     Left shoulder: Normal.     Cervical back: Normal range of motion and neck supple.     Right lower leg: No edema.     Left lower leg: No edema.  Lymphadenopathy:     Cervical: No cervical adenopathy.  Skin:    General: Skin is warm and dry.  Neurological:     Mental Status: She is alert and oriented to person, place, and time.     Deep Tendon Reflexes: Reflexes are normal and symmetric.     Reflex Scores:      Brachioradialis reflexes are 2+ on the right side and 2+  on the left side.      Patellar reflexes are 2+ on the right side and 2+ on the left side. Psychiatric:        Attention and Perception: Attention normal.        Mood and Affect: Mood normal.        Speech: Speech normal.        Behavior: Behavior normal. Behavior is cooperative.        Thought Content: Thought content normal.   STEROID INJECTION Procedure: Posterior Right Steroid Injection  Description: After verbal consent and patient education on procedure, area prepped and draped using semi-sterile technique. Using a posterior approach, a mixture of 4 cc of  1% Marcaine & 1 cc of Kenalog 40 was injected into right shoulder joint -- injection went in smoothly.  A bandage was then placed over the injection site. Complications:  none Post Procedure Instructions: To the ER if any symptoms of erythema or swelling.   Follow Up: PRN  Results for orders placed or performed in visit on 11/21/22  Comprehensive metabolic panel  Result Value Ref Range   Glucose 156 (H) 70 - 99 mg/dL   BUN 13 8 - 27 mg/dL   Creatinine, Ser 4.54 0.57 - 1.00 mg/dL   eGFR 89 >09 WJ/XBJ/4.78   BUN/Creatinine Ratio 18 12 - 28   Sodium 138 134 - 144 mmol/L   Potassium 3.7 3.5 - 5.2 mmol/L   Chloride 95 (L) 96 - 106 mmol/L   CO2 27 20 - 29 mmol/L   Calcium 10.0 8.7 - 10.3 mg/dL   Total Protein 7.4 6.0 - 8.5 g/dL   Albumin 4.6 3.8 - 4.8 g/dL   Globulin, Total 2.8 1.5 - 4.5 g/dL   Bilirubin Total 0.5 0.0 - 1.2 mg/dL   Alkaline Phosphatase 55 44 - 121 IU/L   AST 17 0 - 40 IU/L   ALT 16 0 - 32 IU/L  Lipid Panel w/o Chol/HDL Ratio  Result Value Ref Range   Cholesterol, Total 187 100 - 199 mg/dL   Triglycerides 295 (H) 0 - 149 mg/dL   HDL 45 >62 mg/dL   VLDL Cholesterol Cal 34 5 - 40 mg/dL   LDL Chol Calc (NIH) 130 (H) 0 - 99 mg/dL  HgB Q6V  Result Value Ref Range   Hgb A1c MFr Bld 7.6 (H) 4.8 - 5.6 %   Est. average glucose Bld gHb Est-mCnc 171 mg/dL  Vitamin H84  Result Value Ref Range   Vitamin B-12 548  232 - 1,245 pg/mL      Assessment & Plan:   Problem List Items  Addressed This Visit       Musculoskeletal and Integument   Primary osteoarthritis of right shoulder - Primary    Chronic, with current acute flare. Has known OA to the shoulder based on past imaging.  Has had shoulder injections before, but not in awhile.  Would like one today.  Discussed procedure with her and obtained verbal consent.  Educated on signs and symptoms to go to ER for.  Ensure to apply ice to shoulder every 4 hours for 24 hours and move shoulder as often as possible.  If ongoing pain may need to return to ortho or go to PT.        Follow up plan: Return in about 4 weeks (around 01/24/2023) for T2DM -- started Glipizide.

## 2022-12-27 NOTE — Assessment & Plan Note (Signed)
Chronic, with current acute flare. Has known OA to the shoulder based on past imaging.  Has had shoulder injections before, but not in awhile.  Would like one today.  Discussed procedure with her and obtained verbal consent.  Educated on signs and symptoms to go to ER for.  Ensure to apply ice to shoulder every 4 hours for 24 hours and move shoulder as often as possible.  If ongoing pain may need to return to ortho or go to PT.

## 2023-01-18 NOTE — Patient Instructions (Signed)
Shoulder Pain Many things can cause shoulder pain, including: An injury. Moving the shoulder in the same way again and again (overuse). Joint pain (arthritis). Pain can come from: Swelling and irritation (inflammation) of any part of the shoulder. An injury to: The shoulder joint. Tissues that connect muscle to bone (tendons). Tissues that connect bones to each other (ligaments). Bones. Follow these instructions at home: Watch for changes in your symptoms. Let your doctor know about them. Follow these instructions to help with your pain. If you have a sling that can be taken off: Wear the sling as told by your doctor. Take it off only as told by your doctor. Check the skin around the sling every day. Tell your doctor if you see problems. Loosen the sling if your fingers: Tingle. Become numb. Become cold. Keep the sling clean. If the sling is not waterproof: Do not let it get wet. Take the sling off when you shower or bathe. Managing pain, stiffness, and swelling  If told, put ice on the painful area. Put ice in a plastic bag. Place a towel between your skin and the bag. Leave the ice on for 20 minutes, 2-3 times a day. Stop putting ice on if it does not help with the pain. If your skin turns bright red, take off the ice right away to prevent skin damage. The risk of damage is higher if you cannot feel pain, heat, or cold. Squeeze a soft ball or a foam pad as much as possible. This prevents swelling in the shoulder. It also helps to strengthen the arm. General instructions Take over-the-counter and prescription medicines only as told by your doctor. Keep all follow-up visits. This will help you avoid any type of permanent shoulder problems. Contact a doctor if: Your pain gets worse. Medicine does not help your pain. You have new pain in your arm, hand, or fingers. You loosen your sling and your arm, hand, or fingers: Tingle. Are numb. Are swollen. Get help right away  if: Your arm, hand, or fingers turn white or blue. This information is not intended to replace advice given to you by your health care provider. Make sure you discuss any questions you have with your health care provider. Document Revised: 09/07/2021 Document Reviewed: 09/07/2021 Elsevier Patient Education  2024 [employer]  

## 2023-01-24 ENCOUNTER — Ambulatory Visit: Payer: 59 | Admitting: Nurse Practitioner

## 2023-01-24 ENCOUNTER — Encounter: Payer: Self-pay | Admitting: Nurse Practitioner

## 2023-01-24 VITALS — BP 133/81 | HR 89 | Temp 97.5°F | Ht 65.0 in | Wt 163.6 lb

## 2023-01-24 DIAGNOSIS — Z7984 Long term (current) use of oral hypoglycemic drugs: Secondary | ICD-10-CM | POA: Diagnosis not present

## 2023-01-24 DIAGNOSIS — E1165 Type 2 diabetes mellitus with hyperglycemia: Secondary | ICD-10-CM | POA: Diagnosis not present

## 2023-01-24 NOTE — Progress Notes (Addendum)
BP 133/81 (BP Location: Right Arm, Patient Position: Sitting, Cuff Size: Normal)   Pulse 89   Temp (!) 97.5 F (36.4 C) (Oral)   Ht 5\' 5"  (1.651 m)   Wt 163 lb 9.6 oz (74.2 kg)   LMP  (LMP Unknown) Comment: age 72  SpO2 98%   BMI 27.22 kg/m    Subjective:    Patient ID: Cheryl Mendez, female    DOB: 1950/10/28, 72 y.o.   MRN: [id number]  HPI: Cheryl Mendez is a 72 y.o. female  Chief Complaint  Patient presents with   1 month follow up    Patient stated that the glipizide makes her feel unsteady, today blood sugar was 124 and yesterday was 110   DIABETES Presents today for follow-up with adding low dose Glipizide on 11/21/22.  A1c was 7.6%. Currently taking Rybelsus 7 MG (can not tolerate higher doses = elevation in pancreas levels) and Metformin 1000 MG once daily (tolerates daily dosing only).  She reports the Glipizide is making her sugars too low in 110 to 120 range, which is too low for her.  Makes her feel loopy and off.  She would like to take Lyrica for her neuropathy, tried a friends and this worked well for her.   Past history: Trulicity with GI issues and Invokana stopped due to foot concerns + Jardiance made her sick.  Hypoglycemic episodes: too low for her 110 Polydipsia/polyuria: no Visual disturbance: no Chest pain: no Paresthesias: no Glucose Monitoring: yes  Accucheck frequency: Daily  Fasting glucose: 110 to 120  Post prandial:  Evening:  Before meals: Taking Insulin?: no  Long acting insulin:  Short acting insulin: Blood Pressure Monitoring: not checking Retinal Examination: Not up to Date Foot Exam: Up to Date Diabetic Education: Not Completed Pneumovax: Up to Date Influenza: Up to Date Aspirin: no   Relevant past medical, surgical, family and social history reviewed and updated as indicated. Interim medical history since our last visit reviewed. Allergies and medications reviewed and updated.  Review of Systems  Constitutional:  Negative  for activity change, appetite change, diaphoresis, fatigue and fever.  Respiratory:  Negative for cough, chest tightness, shortness of breath and wheezing.   Cardiovascular:  Negative for chest pain, palpitations and leg swelling.  Gastrointestinal: Negative.   Endocrine: Negative for polydipsia, polyphagia and polyuria.  Neurological: Negative.   Psychiatric/Behavioral: Negative.     Per HPI unless specifically indicated above     Objective:    BP 133/81 (BP Location: Right Arm, Patient Position: Sitting, Cuff Size: Normal)   Pulse 89   Temp (!) 97.5 F (36.4 C) (Oral)   Ht 5\' 5"  (1.651 m)   Wt 163 lb 9.6 oz (74.2 kg)   LMP  (LMP Unknown) Comment: age 57  SpO2 98%   BMI 27.22 kg/m   Wt Readings from Last 3 Encounters:  01/24/23 163 lb 9.6 oz (74.2 kg)  12/27/22 163 lb 6.4 oz (74.1 kg)  11/21/22 161 lb 6.4 oz (73.2 kg)    Physical Exam Vitals and nursing note reviewed.  Constitutional:      General: She is awake. She is not in acute distress.    Appearance: She is well-developed and well-groomed. She is not ill-appearing or toxic-appearing.  HENT:     Head: Normocephalic.     Right Ear: Hearing and external ear normal.     Left Ear: Hearing and external ear normal.  Eyes:     General: Lids are normal.  Right eye: No discharge.        Left eye: No discharge.     Conjunctiva/sclera: Conjunctivae normal.     Pupils: Pupils are equal, round, and reactive to light.  Neck:     Thyroid: No thyromegaly.     Vascular: No carotid bruit.  Cardiovascular:     Rate and Rhythm: Normal rate and regular rhythm.     Heart sounds: Normal heart sounds. No murmur heard.    No gallop.  Pulmonary:     Effort: Pulmonary effort is normal. No accessory muscle usage or respiratory distress.     Breath sounds: Normal breath sounds.  Abdominal:     General: Bowel sounds are normal. There is no distension.     Palpations: Abdomen is soft.     Tenderness: There is no abdominal  tenderness.  Musculoskeletal:     Cervical back: Normal range of motion and neck supple.     Right lower leg: No edema.     Left lower leg: No edema.  Lymphadenopathy:     Cervical: No cervical adenopathy.  Skin:    General: Skin is warm and dry.  Neurological:     Mental Status: She is alert and oriented to person, place, and time.     Deep Tendon Reflexes: Reflexes are normal and symmetric.     Reflex Scores:      Brachioradialis reflexes are 2+ on the right side and 2+ on the left side.      Patellar reflexes are 2+ on the right side and 2+ on the left side. Psychiatric:        Attention and Perception: Attention normal.        Mood and Affect: Mood normal.        Speech: Speech normal.        Behavior: Behavior normal. Behavior is cooperative.        Thought Content: Thought content normal.    Results for orders placed or performed in visit on 11/21/22  Comprehensive metabolic panel  Result Value Ref Range   Glucose 156 (H) 70 - 99 mg/dL   BUN 13 8 - 27 mg/dL   Creatinine, Ser 9.81 0.57 - 1.00 mg/dL   eGFR 89 >19 JY/NWG/9.56   BUN/Creatinine Ratio 18 12 - 28   Sodium 138 134 - 144 mmol/L   Potassium 3.7 3.5 - 5.2 mmol/L   Chloride 95 (L) 96 - 106 mmol/L   CO2 27 20 - 29 mmol/L   Calcium 10.0 8.7 - 10.3 mg/dL   Total Protein 7.4 6.0 - 8.5 g/dL   Albumin 4.6 3.8 - 4.8 g/dL   Globulin, Total 2.8 1.5 - 4.5 g/dL   Bilirubin Total 0.5 0.0 - 1.2 mg/dL   Alkaline Phosphatase 55 44 - 121 IU/L   AST 17 0 - 40 IU/L   ALT 16 0 - 32 IU/L  Lipid Panel w/o Chol/HDL Ratio  Result Value Ref Range   Cholesterol, Total 187 100 - 199 mg/dL   Triglycerides 213 (H) 0 - 149 mg/dL   HDL 45 >08 mg/dL   VLDL Cholesterol Cal 34 5 - 40 mg/dL   LDL Chol Calc (NIH) 657 (H) 0 - 99 mg/dL  HgB Q4O  Result Value Ref Range   Hgb A1c MFr Bld 7.6 (H) 4.8 - 5.6 %   Est. average glucose Bld gHb Est-mCnc 171 mg/dL  Vitamin N62  Result Value Ref Range   Vitamin B-12 548 232 - 1,245 pg/mL  Assessment & Plan:   Problem List Items Addressed This Visit       Endocrine   Poorly controlled type 2 diabetes mellitus (HCC) - Primary    Chronic, ongoing.  A1c 7.6% October, due to dietary indiscretions = recheck today. Urine ALB 18 June 2022 - on ACE. Continue Lotensin for kidney protection. - Recommend she take Metformin 1000 MG BID as ordered (not once daily) and Rybelsus 7 MG daily -- would like to avoid insulin. Did not tolerate SGLT2 or GLP1 injections in past. Continue diet changes at home. Recommend to monitor BS daily at home and document daily with fasting BS goal of <130.  Monitor diet closely with focus on diabetic diet and regular exercise. Have her stop Glipizide due to feeling bad with this. - Consider adding on Lyrica at next visit. - Eye and Foot exam up to date - On ACE and statin therapy - Vaccinations up to date - Will further discuss Lyrica at next visit        Follow up plan: Return in about 1 month (around 02/24/2023) for T2DM, HTN/HLD.

## 2023-01-24 NOTE — Assessment & Plan Note (Addendum)
Chronic, ongoing.  A1c 7.6% October, due to dietary indiscretions = recheck today. Urine ALB 18 June 2022 - on ACE. Continue Lotensin for kidney protection. - Recommend she take Metformin 1000 MG BID as ordered (not once daily) and Rybelsus 7 MG daily -- would like to avoid insulin. Did not tolerate SGLT2 or GLP1 injections in past. Continue diet changes at home. Recommend to monitor BS daily at home and document daily with fasting BS goal of <130.  Monitor diet closely with focus on diabetic diet and regular exercise. Have her stop Glipizide due to feeling bad with this. - Consider adding on Lyrica at next visit. - Eye and Foot exam up to date - On ACE and statin therapy - Vaccinations up to date - Will further discuss Lyrica at next visit

## 2023-01-28 DIAGNOSIS — E113393 Type 2 diabetes mellitus with moderate nonproliferative diabetic retinopathy without macular edema, bilateral: Secondary | ICD-10-CM | POA: Diagnosis not present

## 2023-01-31 ENCOUNTER — Ambulatory Visit: Payer: Self-pay

## 2023-01-31 NOTE — Telephone Encounter (Signed)
Routing to provider to advise. Was Lyrica supposed to be called in for the patient?

## 2023-01-31 NOTE — Telephone Encounter (Signed)
Summary: Medication   Pt called today to see why she wasn't given the Lyric at her pharmacy, she said that she was suppose to start it. I looked in the notes and the dr stated that they would discuss at her next dr appt on 01/03. Pt still thinks that she was suppose to start it it, that she was calling it in right then. Please advise. Please and thank you.       Chief Complaint: Pt. States she thought Jolene "was going to call in Lyrica."  Symptoms: n/a Frequency: n/a Pertinent Negatives: Patient denies  Disposition: [] ED /[] Urgent Care (no appt availability in office) / [] Appointment(In office/virtual)/ []  [facility]/ [] Home Care/ [] Refused Recommended Disposition /[] [facility]/ [x]  Follow-up with PCP Additional Notes: Please advise pt.  Reason for Disposition  [1] Caller has URGENT medicine question about med that PCP or specialist prescribed AND [2] triager unable to answer question  Answer Assessment - Initial Assessment Questions 1. NAME of MEDICINE: "What medicine(s) are you calling about?"     Lyrica 2. QUESTION: "What is your question?" (e.g., double dose of medicine, side effect)     Was she going to start her on it? 3. PRESCRIBER: "Who prescribed the medicine?" Reason: if prescribed by specialist, call should be referred to that group.     N/a 4. SYMPTOMS: "Do you have any symptoms?" If Yes, ask: "What symptoms are you having?"  "How bad are the symptoms (e.g., mild, moderate, severe)     N/a 5. PREGNANCY:  "Is there any chance that you are pregnant?" "When was your last menstrual period?"     No  Protocols used: Medication Question Call-A-AH

## 2023-02-01 MED ORDER — PREGABALIN 25 MG PO CAPS: 25.0000 mg | ORAL_CAPSULE | Freq: Two times a day (BID) | ORAL | 2 refills | Status: DC

## 2023-02-01 NOTE — Addendum Note (Signed)
Addended by: Aura Dials T on: 02/01/2023 06:21 AM   Modules accepted: Orders

## 2023-02-11 DIAGNOSIS — M7541 Impingement syndrome of right shoulder: Secondary | ICD-10-CM | POA: Diagnosis not present

## 2023-02-11 DIAGNOSIS — M25511 Pain in right shoulder: Secondary | ICD-10-CM | POA: Diagnosis not present

## 2023-02-18 ENCOUNTER — Ambulatory Visit: Payer: 59 | Admitting: Nurse Practitioner

## 2023-02-21 ENCOUNTER — Ambulatory Visit: Payer: 59 | Admitting: Nurse Practitioner

## 2023-02-21 DIAGNOSIS — E1165 Type 2 diabetes mellitus with hyperglycemia: Secondary | ICD-10-CM

## 2023-02-21 DIAGNOSIS — E538 Deficiency of other specified B group vitamins: Secondary | ICD-10-CM

## 2023-02-21 DIAGNOSIS — F5101 Primary insomnia: Secondary | ICD-10-CM

## 2023-02-21 DIAGNOSIS — E1169 Type 2 diabetes mellitus with other specified complication: Secondary | ICD-10-CM

## 2023-02-21 DIAGNOSIS — I152 Hypertension secondary to endocrine disorders: Secondary | ICD-10-CM

## 2023-02-24 ENCOUNTER — Ambulatory Visit: Payer: 59 | Admitting: Nurse Practitioner

## 2023-02-24 DIAGNOSIS — E1165 Type 2 diabetes mellitus with hyperglycemia: Secondary | ICD-10-CM

## 2023-02-24 DIAGNOSIS — E538 Deficiency of other specified B group vitamins: Secondary | ICD-10-CM

## 2023-02-24 DIAGNOSIS — M19011 Primary osteoarthritis, right shoulder: Secondary | ICD-10-CM

## 2023-02-24 DIAGNOSIS — F5101 Primary insomnia: Secondary | ICD-10-CM

## 2023-02-24 DIAGNOSIS — E1169 Type 2 diabetes mellitus with other specified complication: Secondary | ICD-10-CM

## 2023-02-24 DIAGNOSIS — E1159 Type 2 diabetes mellitus with other circulatory complications: Secondary | ICD-10-CM

## 2023-02-26 ENCOUNTER — Encounter: Payer: Self-pay | Admitting: Nurse Practitioner

## 2023-02-26 ENCOUNTER — Ambulatory Visit (INDEPENDENT_AMBULATORY_CARE_PROVIDER_SITE_OTHER): Payer: 59 | Admitting: Nurse Practitioner

## 2023-02-26 VITALS — BP 128/80 | HR 85 | Temp 97.9°F | Wt 162.6 lb

## 2023-02-26 DIAGNOSIS — I152 Hypertension secondary to endocrine disorders: Secondary | ICD-10-CM | POA: Diagnosis not present

## 2023-02-26 DIAGNOSIS — E1169 Type 2 diabetes mellitus with other specified complication: Secondary | ICD-10-CM

## 2023-02-26 DIAGNOSIS — E1159 Type 2 diabetes mellitus with other circulatory complications: Secondary | ICD-10-CM

## 2023-02-26 DIAGNOSIS — E538 Deficiency of other specified B group vitamins: Secondary | ICD-10-CM

## 2023-02-26 DIAGNOSIS — E1165 Type 2 diabetes mellitus with hyperglycemia: Secondary | ICD-10-CM

## 2023-02-26 DIAGNOSIS — Z7984 Long term (current) use of oral hypoglycemic drugs: Secondary | ICD-10-CM | POA: Diagnosis not present

## 2023-02-26 DIAGNOSIS — E785 Hyperlipidemia, unspecified: Secondary | ICD-10-CM

## 2023-02-26 LAB — MICROALBUMIN, URINE WAIVED
Creatinine, Urine Waived: 300 mg/dL (ref 10–300)
Microalb, Ur Waived: 30 mg/L — ABNORMAL HIGH (ref 0–19)
Microalb/Creat Ratio: <30 mg/g (ref <30)

## 2023-02-26 LAB — BAYER DCA HB A1C WAIVED: HB A1C (BAYER DCA - WAIVED): 7.2 % — ABNORMAL HIGH (ref 4.8–5.6)

## 2023-02-26 MED ORDER — PREGABALIN 25 MG PO CAPS: 25.0000 mg | ORAL_CAPSULE | Freq: Two times a day (BID) | ORAL | 2 refills | Status: DC

## 2023-02-26 NOTE — Assessment & Plan Note (Signed)
 Chronic, ongoing.  A1c 7.2% today, trend down. Urine ALB 20 March 2023 - on ACE. Continue Lotensin  for kidney protection. - Recommend she take Metformin  1000 MG BID as ordered (not once daily) and Rybelsus  7 MG daily -- would like to avoid insulin. Did not tolerate SGLT2, Glipizide , or GLP1 injections in past. Continue diet changes at home. Recommend to monitor BS daily at home and document daily with fasting BS goal of <130.  Monitor diet closely with focus on diabetic diet and regular exercise.  - Continue Lyrica  for neuropathy which she is finding benefit from - Eye and Foot exam up to date - On ACE and statin therapy - Vaccinations up to date

## 2023-02-26 NOTE — Progress Notes (Signed)
 BP 128/80 (BP Location: Left Arm, Patient Position: Sitting)   Pulse 85   Temp 97.9 F (36.6 C) (Oral)   Wt 162 lb 9.6 oz (73.8 kg)   LMP  (LMP Unknown) Comment: age 73  SpO2 98%   BMI 27.06 kg/m    Subjective:    Patient ID: Cheryl Mendez, female    DOB: 08-Apr-1950, 73 y.o.   MRN: [id number]  HPI: Cheryl Mendez is a 73 y.o. female  Chief Complaint  Patient presents with   Diabetes   Hyperlipidemia   Hypertension   DIABETES 7.6% A1c in October. Taking Rybelsus  7 MG (can not tolerate higher doses = elevation in pancreas levels) and Metformin  1000 MG once daily (tolerates daily dosing only).  She feels the Rybelsus  helps her appetite be less, has been losing weight. Taking B12 daily for deficiency and Lyrica  25 MG BID -- she reports this is helping her rest and helping foot discomfort.  No longer using Trazodone .  Past history: Trulicity  with GI issues and Invokana  stopped due to foot concerns + Jardiance  made her sick.  Glipizide  made her feel unwell and sugars too low. Hypoglycemic episodes:no Polydipsia/polyuria: no Visual disturbance: no Chest pain: no Paresthesias: no Glucose Monitoring: no  Accucheck frequency: occasional  Fasting glucose: 140 Monday  Post prandial:  Evening:  Before meals: Taking Insulin?: no  Long acting insulin:  Short acting insulin: Blood Pressure Monitoring: not checking Retinal Examination: Not Up to Date - Dr. Carolee (April 2024) Foot Exam: Up to Date Pneumovax: Up to Date Influenza: Up to Date Aspirin: no   HYPERTENSION / HYPERLIPIDEMIA Currently taking Lotensin  20-25 and Atorvastatin  10 MG.  Satisfied with current treatment? yes Duration of hypertension: chronic BP monitoring frequency: not checking BP range:  BP medication side effects: no Duration of hyperlipidemia: chronic Cholesterol medication side effects: no Cholesterol supplements: none Medication compliance: good compliance Aspirin: no Recent stressors:  no Recurrent headaches: no Visual changes: no Palpitations: no Dyspnea: no Chest pain: no Lower extremity edema: no Dizzy/lightheaded: no The 10-year ASCVD risk score (Arnett DK, et al., 2019) is: 28%   Values used to calculate the score:     Age: 9 years     Sex: Female     Is Non-Hispanic African American: No     Diabetic: Yes     Tobacco smoker: No     Systolic Blood Pressure: 128 mmHg     Is BP treated: Yes     HDL Cholesterol: 45 mg/dL     Total Cholesterol: 187 mg/dL  Relevant past medical, surgical, family and social history reviewed and updated as indicated. Interim medical history since our last visit reviewed. Allergies and medications reviewed and updated.  Review of Systems  Constitutional:  Negative for activity change, appetite change, diaphoresis, fatigue and fever.  HENT: Negative.    Respiratory:  Negative for cough, chest tightness, shortness of breath and wheezing.   Cardiovascular:  Negative for chest pain, palpitations and leg swelling.  Gastrointestinal: Negative.   Endocrine: Negative for cold intolerance, heat intolerance, polydipsia, polyphagia and polyuria.  Neurological: Negative.   Psychiatric/Behavioral:  Negative for decreased concentration, self-injury, sleep disturbance and suicidal ideas. The patient is not nervous/anxious.    Per HPI unless specifically indicated above     Objective:    BP 128/80 (BP Location: Left Arm, Patient Position: Sitting)   Pulse 85   Temp 97.9 F (36.6 C) (Oral)   Wt 162 lb 9.6 oz (73.8 kg)  LMP  (LMP Unknown) Comment: age 25  SpO2 98%   BMI 27.06 kg/m   Wt Readings from Last 3 Encounters:  02/26/23 162 lb 9.6 oz (73.8 kg)  01/24/23 163 lb 9.6 oz (74.2 kg)  12/27/22 163 lb 6.4 oz (74.1 kg)    Physical Exam Vitals and nursing note reviewed.  Constitutional:      General: She is awake. She is not in acute distress.    Appearance: She is well-developed and well-groomed. She is not ill-appearing or  toxic-appearing.  HENT:     Head: Normocephalic.     Right Ear: Hearing and external ear normal.     Left Ear: Hearing and external ear normal.  Eyes:     General: Lids are normal.        Right eye: No discharge.        Left eye: No discharge.     Extraocular Movements: Extraocular movements intact.     Conjunctiva/sclera: Conjunctivae normal.     Pupils: Pupils are equal, round, and reactive to light.     Visual Fields: Right eye visual fields normal and left eye visual fields normal.  Neck:     Thyroid : No thyromegaly.     Vascular: No carotid bruit.  Cardiovascular:     Rate and Rhythm: Normal rate and regular rhythm.     Heart sounds: Normal heart sounds. No murmur heard.    No gallop.  Pulmonary:     Effort: Pulmonary effort is normal. No accessory muscle usage or respiratory distress.     Breath sounds: Normal breath sounds.  Abdominal:     General: Bowel sounds are normal. There is no distension.     Palpations: Abdomen is soft.     Tenderness: There is no abdominal tenderness.  Musculoskeletal:     Cervical back: Normal range of motion and neck supple.     Right lower leg: No edema.     Left lower leg: No edema.  Lymphadenopathy:     Cervical: No cervical adenopathy.  Skin:    General: Skin is warm and dry.  Neurological:     Mental Status: She is alert and oriented to person, place, and time.     Deep Tendon Reflexes: Reflexes are normal and symmetric.     Reflex Scores:      Brachioradialis reflexes are 2+ on the right side and 2+ on the left side.      Patellar reflexes are 2+ on the right side and 2+ on the left side. Psychiatric:        Attention and Perception: Attention normal.        Mood and Affect: Mood normal.        Speech: Speech normal.        Behavior: Behavior normal. Behavior is cooperative.        Thought Content: Thought content normal.    Results for orders placed or performed in visit on 02/26/23  Bayer DCA Hb A1c Waived (STAT)    Collection Time: 02/26/23 11:10 AM  Result Value Ref Range   HB A1C (BAYER DCA - WAIVED) 7.2 (H) 4.8 - 5.6 %  Microalbumin, Urine Waived (STAT)   Collection Time: 02/26/23 11:10 AM  Result Value Ref Range   Microalb, Ur Waived 30 (H) 0 - 19 mg/L   Creatinine, Urine Waived 300 10 - 300 mg/dL   Microalb/Creat Ratio <30 <30 mg/g      Assessment & Plan:   Problem List Items Addressed  This Visit       Cardiovascular and Mediastinum   Hypertension associated with diabetes (HCC)   Chronic, stable.  BP at goal in office today.  Continue current medication regimen and adjust as needed -- Lotensin  offering kidney protection.  Recommend checking BP at home three mornings a week and documenting for provider review.  LABS: CMP.  Urine ALB 20 March 2023.  Return in 3 months.      Relevant Orders   Lipid Panel w/o Chol/HDL Ratio   Comp Met (CMET)     Endocrine   Hyperlipidemia associated with type 2 diabetes mellitus (HCC)   Chronic, ongoing.  Continue current medication regimen and adjust as needed.  Lipid panel today.      Relevant Orders   Lipid Panel w/o Chol/HDL Ratio   Comp Met (CMET)   Poorly controlled type 2 diabetes mellitus (HCC) - Primary   Chronic, ongoing.  A1c 7.2% today, trend down. Urine ALB 20 March 2023 - on ACE. Continue Lotensin  for kidney protection. - Recommend she take Metformin  1000 MG BID as ordered (not once daily) and Rybelsus  7 MG daily -- would like to avoid insulin. Did not tolerate SGLT2, Glipizide , or GLP1 injections in past. Continue diet changes at home. Recommend to monitor BS daily at home and document daily with fasting BS goal of <130.  Monitor diet closely with focus on diabetic diet and regular exercise.  - Continue Lyrica  for neuropathy which she is finding benefit from - Eye and Foot exam up to date - On ACE and statin therapy - Vaccinations up to date      Relevant Orders   Bayer DCA Hb A1c Waived (STAT) (Completed)   Microalbumin, Urine  Waived (STAT) (Completed)     Other   B12 deficiency   Noted low normal levels on labs in past.  Recommend she continue taking Vitamin B12 500 to 1000 MCG daily.  Recheck level today.  Explained to patient the benefit of annual B12 level testing while on daily Metformin .  Educated patient on the effect of Metformin  on absorption of Vitamin B12 and symptoms of low B12 level, such as neuropathic pain.  Reduced absorption of vitamin B12 is a known adverse effect of long-term use of Metformin  and routine B12 monitoring may be considered in patients with poor dietary intake or absorption Cliffton, D. J., 2019 via UpToDate).         Relevant Orders   Vitamin B12     Follow up plan: Return in about 3 months (around 05/27/2023) for T2DM, HTN/HLD, GERD, B12.

## 2023-02-26 NOTE — Assessment & Plan Note (Signed)
Noted low normal levels on labs in past.  Recommend she continue taking Vitamin B12 500 to 1000 MCG daily.  Recheck level today.  Explained to patient the benefit of annual B12 level testing while on daily Metformin.  Educated patient on the effect of Metformin on absorption of Vitamin B12 and symptoms of low B12 level, such as neuropathic pain.  "Reduced absorption of vitamin B12 is a known adverse effect of long-term use of Metformin" and "routine B12 monitoring may be considered in patients with poor dietary intake or absorption" Pollie Meyer, D. J., 2019 via UpToDate).

## 2023-02-26 NOTE — Assessment & Plan Note (Signed)
 Chronic, stable.  BP at goal in office today.  Continue current medication regimen and adjust as needed -- Lotensin  offering kidney protection.  Recommend checking BP at home three mornings a week and documenting for provider review.  LABS: CMP.  Urine ALB 20 March 2023.  Return in 3 months.

## 2023-02-26 NOTE — Patient Instructions (Signed)

## 2023-02-26 NOTE — Assessment & Plan Note (Signed)
 Chronic, ongoing.  Continue current medication regimen and adjust as needed. Lipid panel today.

## 2023-02-27 ENCOUNTER — Telehealth: Payer: Self-pay

## 2023-02-27 LAB — COMPREHENSIVE METABOLIC PANEL
ALT: 18 [IU]/L (ref 0–32)
AST: 14 [IU]/L (ref 0–40)
Albumin: 4.1 g/dL (ref 3.8–4.8)
Alkaline Phosphatase: 42 [IU]/L — ABNORMAL LOW (ref 44–121)
BUN/Creatinine Ratio: 23 (ref 12–28)
BUN: 14 mg/dL (ref 8–27)
Bilirubin Total: 0.6 mg/dL (ref 0.0–1.2)
CO2: 26 mmol/L (ref 20–29)
Calcium: 9.4 mg/dL (ref 8.7–10.3)
Chloride: 99 mmol/L (ref 96–106)
Creatinine, Ser: 0.62 mg/dL (ref 0.57–1.00)
Globulin, Total: 2.5 g/dL (ref 1.5–4.5)
Glucose: 114 mg/dL — ABNORMAL HIGH (ref 70–99)
Potassium: 3.8 mmol/L (ref 3.5–5.2)
Sodium: 140 mmol/L (ref 134–144)
Total Protein: 6.6 g/dL (ref 6.0–8.5)
eGFR: 95 mL/min/{1.73_m2} (ref >59)

## 2023-02-27 LAB — LIPID PANEL W/O CHOL/HDL RATIO
Cholesterol, Total: 157 mg/dL (ref 100–199)
HDL: 44 mg/dL (ref >39)
LDL Chol Calc (NIH): 81 mg/dL (ref 0–99)
Triglycerides: 192 mg/dL — ABNORMAL HIGH (ref 0–149)
VLDL Cholesterol Cal: 32 mg/dL (ref 5–40)

## 2023-02-27 LAB — VITAMIN B12: Vitamin B-12: 482 pg/mL (ref 232–1245)

## 2023-02-27 NOTE — Progress Notes (Signed)
 Good afternoon, please let Cheryl Mendez know her labs have returned and are overall stable.  I would like to see LDL, bad cholesterol, a little lower and would recommend increasing Atorvastatin  to 80 MG.  If she is okay with this let me know and I will send in this dose and she will stop Atorvastatin  40 MG daily.  Any questions? Keep being stellar!!  Thank you for allowing me to participate in your care.  I appreciate you. Kindest regards, Antonietta Lansdowne

## 2023-02-27 NOTE — Telephone Encounter (Signed)
 PA for Rybelsus initiated and submitted via Cover My Meds. Key: [id number]

## 2023-03-05 NOTE — Telephone Encounter (Signed)
 PA cancelled as medication is covered by the patient's insurance per Cover My Meds.

## 2023-03-24 ENCOUNTER — Ambulatory Visit: Payer: Self-pay

## 2023-03-24 NOTE — Telephone Encounter (Signed)
Summary: stomach discomfot   The patient shares that they are continuing to experience stomach discomfort and flu like symptoms  The patient shares that they first began to experience discomfort on 03/19/23  Please contact the patient further when possible     Called pt back pt is not calling about herself she is calling regarding another pt.Dannielle Huh AllredMRN: [id number]

## 2023-04-08 ENCOUNTER — Other Ambulatory Visit: Payer: 59

## 2023-04-12 ENCOUNTER — Other Ambulatory Visit: Payer: Self-pay | Admitting: Nurse Practitioner

## 2023-04-14 NOTE — Telephone Encounter (Signed)
 Requested Prescriptions  Pending Prescriptions Disp Refills   atorvastatin (LIPITOR) 10 MG tablet [Pharmacy Med Name: ATORVASTATIN 10MG  TABLETS] 90 tablet 3    Sig: TAKE 1 TABLET(10 MG) BY MOUTH DAILY     Cardiovascular:  Antilipid - Statins Failed - 04/14/2023  2:25 PM      Failed - Lipid Panel in normal range within the last 12 months    Cholesterol, Total  Date Value Ref Range Status  02/26/2023 157 100 - 199 mg/dL Final   Cholesterol Piccolo, Waived  Date Value Ref Range Status  07/28/2018 159 <200 mg/dL Final    Comment:                            Desirable                <200                         Borderline High      200- 239                         High                     >239    LDL Chol Calc (NIH)  Date Value Ref Range Status  02/26/2023 81 0 - 99 mg/dL Final   HDL  Date Value Ref Range Status  02/26/2023 44 >39 mg/dL Final   Triglycerides  Date Value Ref Range Status  02/26/2023 192 (H) 0 - 149 mg/dL Final   Triglycerides Piccolo,Waived  Date Value Ref Range Status  07/28/2018 308 (H) <150 mg/dL Final    Comment:                            Normal                   <150                         Borderline High     150 - 199                         High                200 - 499                         Very High                >499          Passed - Patient is not pregnant      Passed - Valid encounter within last 12 months    Recent Outpatient Visits           1 month ago Poorly controlled type 2 diabetes mellitus (HCC)   [place] [facility] [place], [place] T, NP   2 months ago Poorly controlled type 2 diabetes mellitus (HCC)   [place] [facility] [place], [place] T, NP   3 months ago Primary osteoarthritis of right shoulder   [place] [facility] [place], [place] T, NP   4 months ago Poorly controlled type 2 diabetes mellitus [facility])   [place] [facility] [place], [place] T,  NP   5  months ago Generalized abdominal pain   [place] [facility] [place], Corrie Dandy T, NP       Future Appointments             In 1 month [place], [place] T, NP [place] [facility]             loratadine (CLARITIN) 10 MG tablet [Pharmacy Med Name: ALLERGY RELF (LORATADINE) 10MG  TABS] 90 tablet 0    Sig: TAKE 1 TABLET(10 MG) BY MOUTH DAILY     Ear, Nose, and Throat:  Antihistamines 2 Passed - 04/14/2023  2:25 PM      Passed - Cr in normal range and within 360 days    Creatinine  Date Value Ref Range Status  06/09/2014 0.73 mg/dL Final    Comment:    1.61-0.96 NOTE: New Reference Range  04/26/14    Creatinine, Ser  Date Value Ref Range Status  02/26/2023 0.62 0.57 - 1.00 mg/dL Final         Passed - Valid encounter within last 12 months    Recent Outpatient Visits           1 month ago Poorly controlled type 2 diabetes mellitus (HCC)   [place] [facility] [place], [place] T, NP   2 months ago Poorly controlled type 2 diabetes mellitus (HCC)   [place] [facility] [place], Corrie Dandy T, NP   3 months ago Primary osteoarthritis of right shoulder   [place] [facility] [place], [place] T, NP   4 months ago Poorly controlled type 2 diabetes mellitus (HCC)   [place] [facility] [place], Corrie Dandy T, NP   5 months ago Generalized abdominal pain   [place] [facility] [place], Dorie Rank, NP       Future Appointments             In 1 month [place], Dorie Rank, NP [place] [facility], [facility]

## 2023-04-16 ENCOUNTER — Other Ambulatory Visit: Payer: Self-pay | Admitting: Nurse Practitioner

## 2023-04-16 NOTE — Telephone Encounter (Signed)
 Requested medication (s) are due for refill today: Yes  Requested medication (s) are on the active medication list: Yes  Last refill:  06/19/22  Future visit scheduled: No  Notes to clinic:  Unable to refill due to no refill protocol for this medication.      Requested Prescriptions  Pending Prescriptions Disp Refills   RYBELSUS 7 MG TABS [Pharmacy Med Name: RYBELSUS 7MG  TABLETS] 30 tablet 12    Sig: TAKE 1 TABLET BY MOUTH DAILY     Off-Protocol Failed - 04/16/2023 11:55 AM      Failed - Medication not assigned to a protocol, review manually.      Passed - Valid encounter within last 12 months    Recent Outpatient Visits           1 month ago Poorly controlled type 2 diabetes mellitus (HCC)   [place] [facility] [place], [place] T, NP   2 months ago Poorly controlled type 2 diabetes mellitus (HCC)   [place] [facility] [place], Corrie Dandy T, NP   3 months ago Primary osteoarthritis of right shoulder   [place] [facility] [place], [place] T, NP   4 months ago Poorly controlled type 2 diabetes mellitus (HCC)   [place] [facility] [place], Corrie Dandy T, NP   5 months ago Generalized abdominal pain   [place] [facility] [place], Dorie Rank, NP       Future Appointments             In 1 month Cannady, Dorie Rank, NP  [facility], [facility]

## 2023-04-18 ENCOUNTER — Ambulatory Visit: Payer: Self-pay | Admitting: Nurse Practitioner

## 2023-04-18 NOTE — Telephone Encounter (Signed)
 Chief Complaint: Nausea, dizziness with standing Symptoms: dizziness/lightheadedness, fever, nausea Frequency: this morning Pertinent Negatives: Patient denies SOB, CMS changes, speech changes, vision changes,   Disposition: [] ED /[] Urgent Care (no appt availability in office) / [x] Appointment(In office/virtual)/ []  [place] [facility]/ [] Home Care/ [x] Refused Recommended Disposition /[] [place] [facility]/ []  Follow-up with PCP  Additional Notes: pt would like the medication that she was given a few months ago, for a virus. Pt is sure she has a fever, but does not have a thermometer. Pt states she is nauseous, but only vomited once today. Pt states that she has minimal dizziness, she believes "it is because my belly is so upset". Pt has zofran at home and is taking a dose during the call. Pt advised in 20-30 min that she could attempt to drink fluids/eat at that time. Pt states that she would just like her PCP to send her "something in like the last time I had this stomach virus". Pt only wanting to schedule with her PCP, next appt 3/10. Pt refusing all/other appt options. Pt denies CMS changes, vision changes, also denies speech changes. Pt is Aox4/4. Pt advised if dizziness gets worse to seek ED treatment, states she is concerned about the nausea and not the dizziness.  Copied from CRM [id number]. Topic: Clinical - Red Word Triage >> Apr 18, 2023  3:25 PM Higinio Roger wrote: Red Word that prompted transfer to Nurse Triage: Patient would like ondansetron (ZOFRAN-ODT) 4 MG disintegrating tablet due to feeling nausea. She also has a fever and states she feels dizzy. Patient also has weakness in joints and leans on the wall to walk due to feeling as if she would fall. Reason for Disposition . Unexplained nausea . [1] MILD dizziness (e.g., walking normally) AND [2] has NOT been evaluated by doctor (or NP/PA) for this  (Exception: Dizziness caused by heat exposure, sudden standing, or poor fluid  intake.)  Answer Assessment - Initial Assessment Questions 1. NAUSEA SEVERITY: "How bad is the nausea?" (e.g., mild, moderate, severe; dehydration, weight loss)   - MILD: loss of appetite without change in eating habits   - MODERATE: decreased oral intake without significant weight loss, dehydration, or malnutrition   - SEVERE: inadequate caloric or fluid intake, significant weight loss, symptoms of dehydration     Mild, thrown up once 2. ONSET: "When did the nausea begin?"     Started this morning 3. VOMITING: "Any vomiting?" If Yes, ask: "How many times today?"     Once  4. RECURRENT SYMPTOM: "Have you had nausea before?" If Yes, ask: "When was the last time?" "What happened that time?"     Has had this before, was told it was a stomach virus 5. CAUSE: "What do you think is causing the nausea?"     Stomach virus  Answer Assessment - Initial Assessment Questions 1. DESCRIPTION: "Describe your dizziness."     lightheadedness 2. LIGHTHEADED: "Do you feel lightheaded?" (e.g., somewhat faint, woozy, weak upon standing)     weak 3. VERTIGO: "Do you feel like either you or the room is spinning or tilting?" (i.e. vertigo)     States this does not feel like her prior vertigo 4. SEVERITY: "How bad is it?"  "Do you feel like you are going to faint?" "Can you stand and walk?"   - MILD: Feels slightly dizzy, but walking normally.   - MODERATE: Feels unsteady when walking, but not falling; interferes with normal activities (e.g., school, work).   - SEVERE: Unable  to walk without falling, or requires assistance to walk without falling; feels like passing out now.      Mild, states she can still walk normally 5. ONSET:  "When did the dizziness begin?"     This morning when she woke 8. CAUSE: "What do you think is causing the dizziness?"     Nausea/stomach 10. OTHER SYMPTOMS: "Do you have any other symptoms?" (e.g., fever, chest pain, vomiting, diarrhea, bleeding)       States fever-unknown as no  thermometer, vomited x1,  Protocols used: Nausea-A-AH, Dizziness - Lightheadedness-A-AH

## 2023-04-30 ENCOUNTER — Ambulatory Visit: Payer: Self-pay | Admitting: Nurse Practitioner

## 2023-04-30 NOTE — Telephone Encounter (Signed)
 Patient will need an appointment before medication can be sent in. Please call to schedule.

## 2023-04-30 NOTE — Telephone Encounter (Signed)
 Copied from CRM [id number]. Topic: Clinical - Red Word Triage >> Apr 30, 2023 12:31 PM Cheryl Mendez wrote: Red Word that prompted transfer to Nurse Triage: Patient has had deep cough, runny nose for about a week and a half. Now having chest pain.   Chief Complaint: cough Symptoms: chest pain and moderate shortness of breath Frequency: constant Pertinent Negatives: Patient denies fever Disposition: [] ED /[] Urgent Care (no appt availability in office) / [] Appointment(In office/virtual)/ []  [place] [facility]/ [] Home Care/ [] Refused Recommended Disposition /[] [place] [facility]/ []  Follow-up with PCP Additional Notes: Pt endorsing productive cough x 1 week, unrelieved with OTC meds. Reports chest pain last night, worse with deep breath. Pt tried taking deep breath while on call and went into a coughing fit.  Pt also reports shortness of breath rest.  Stated "I feel winded just sitting here".  Pt under impression a z-pack can be "just called in". RN advising ED. Pt refused. Still requesting a zpack prescription. Advised pt that someone will contact her from the office with further direction.   Reason for Disposition  Chest pain  (Exception: MILD central chest pain, present only when coughing.)  Answer Assessment - Initial Assessment Questions 1. ONSET: "When did the cough begin?"      A week and a cough  2. SEVERITY: "How bad is the cough today?"      Getting worse,   3. SPUTUM: "Describe the color of your sputum" (none, dry cough; clear, white, yellow, green)     Yellowish-green  4. HEMOPTYSIS: "Are you coughing up any blood?" If so ask: "How much?" (flecks, streaks, tablespoons, etc.)     No  5. DIFFICULTY BREATHING: "Are you having difficulty breathing?" If Yes, ask: "How bad is it?" (e.g., mild, moderate, severe)    - MILD: No SOB at rest, mild SOB with walking, speaks normally in sentences, can lie down, no retractions, pulse < 100.    - MODERATE: SOB at rest, SOB with minimal  exertion and prefers to sit, cannot lie down flat, speaks in phrases, mild retractions, audible wheezing, pulse 100-120.    - SEVERE: Very SOB at rest, speaks in single words, struggling to breathe, sitting hunched forward, retractions, pulse > 120      Moderate shortness of breath  6. FEVER: "Do you have a fever?" If Yes, ask: "What is your temperature, how was it measured, and when did it start?"     Low grade fever  7. CARDIAC HISTORY: "Do you have any history of heart disease?" (e.g., heart attack, congestive heart failure)      HLD and HTN  8. LUNG HISTORY: "Do you have any history of lung disease?"  (e.g., pulmonary embolus, asthma, emphysema)     No  9. PE RISK FACTORS: "Do you have a history of blood clots?" (or: recent major surgery, recent prolonged travel, bedridden)     No  10. OTHER SYMPTOMS: "Do you have any other symptoms?" (e.g., runny nose, wheezing, chest pain)       Chest pain, congestion  11. PREGNANCY: "Is there any chance you are pregnant?" "When was your last menstrual period?"       No  12. TRAVEL: "Have you traveled out of the country in the last month?" (e.g., travel history, exposures)       No  Protocols used: Cough - Acute Productive-A-AH

## 2023-05-01 NOTE — Telephone Encounter (Signed)
 Appointment has been made

## 2023-05-02 ENCOUNTER — Telehealth: Admitting: Nurse Practitioner

## 2023-05-02 ENCOUNTER — Encounter: Payer: Self-pay | Admitting: Nurse Practitioner

## 2023-05-02 DIAGNOSIS — J0110 Acute frontal sinusitis, unspecified: Secondary | ICD-10-CM | POA: Diagnosis not present

## 2023-05-02 DIAGNOSIS — J321 Chronic frontal sinusitis: Secondary | ICD-10-CM | POA: Insufficient documentation

## 2023-05-02 DIAGNOSIS — J329 Chronic sinusitis, unspecified: Secondary | ICD-10-CM | POA: Insufficient documentation

## 2023-05-02 MED ORDER — SENNA-DOCUSATE SODIUM 8.6-50 MG PO TABS: 1.0000 | ORAL_TABLET | Freq: Two times a day (BID) | ORAL | 12 refills | Status: AC

## 2023-05-02 MED ORDER — PREDNISONE 20 MG PO TABS: 40.0000 mg | ORAL_TABLET | Freq: Every day | ORAL | 0 refills | Status: AC

## 2023-05-02 MED ORDER — ALBUTEROL SULFATE HFA 108 (90 BASE) MCG/ACT IN AERS: 2.0000 | INHALATION_SPRAY | Freq: Four times a day (QID) | RESPIRATORY_TRACT | 3 refills | Status: DC | PRN

## 2023-05-02 MED ORDER — DOXYCYCLINE HYCLATE 100 MG PO TABS
100.0000 mg | ORAL_TABLET | Freq: Two times a day (BID) | ORAL | 0 refills | Status: AC
Start: 2023-05-02 — End: 2023-05-09

## 2023-05-02 NOTE — Progress Notes (Signed)
 LMP  (LMP Unknown) Comment: age 73   Subjective:    Patient ID: Cheryl Mendez Cheryl Mendez, female    DOB: 09-06-1950, 73 y.o.   MRN: [id number]  HPI: Cheryl Mendez is a 73 y.o. female  Chief Complaint  Patient presents with   URI    Patient states she has been having a cough, nasal and chest congestion, yellow phlegm, runny nose and since pressure. States she has been taking some OTC cold medications. States her chest is hurting from coughing so much. States she has been having these symptoms for a week and a half.    Virtual Visit via Video Note  I connected with Cheryl Mendez on 05/02/23 at  4:20 PM EDT by a video enabled telemedicine application and verified that I am speaking with the correct person using two identifiers.  Location: Patient: home Provider: work   I discussed the limitations of evaluation and management by telemedicine and the availability of in person appointments. The patient expressed understanding and agreed to proceed.  I discussed the assessment and treatment plan with the patient. The patient was provided an opportunity to ask questions and all were answered. The patient agreed with the plan and demonstrated an understanding of the instructions.   The patient was advised to call back or seek an in-person evaluation if the symptoms worsen or if the condition fails to improve as anticipated.  I provided 25 minutes of non-face-to-face time during this encounter.   Marjie Skiff, NP   UPPER RESPIRATORY TRACT INFECTION Started with symptoms 1 1/2 weeks ago.  Cough and congestion. Fever: low grade only Cough: yes Shortness of breath:  occasional Wheezing: yes Chest pain: no Chest tightness: no Chest congestion: yes Nasal congestion: yes Runny nose: yes Post nasal drip: yes Sneezing: no Sore throat: no Swollen glands: no Sinus pressure: yes Headache: no Face pain: yes Toothache: no Ear pain: none Ear pressure: yes bilateral Eyes  red/itching:no Eye drainage/crusting: no  Vomiting: nausea with cough drops Rash: no Fatigue: yes Sick contacts: yes Strep contacts: no  Context: worse Recurrent sinusitis: no Relief with OTC cold/cough medications: no  Treatments attempted: Alka Seltzer cough,  cough drops  Relevant past medical, surgical, family and social history reviewed and updated as indicated. Interim medical history since our last visit reviewed. Allergies and medications reviewed and updated.  Review of Systems  Constitutional:  Positive for fatigue and fever. Negative for activity change, appetite change and chills.  HENT:  Positive for congestion, postnasal drip, rhinorrhea and sinus pressure. Negative for ear discharge, ear pain, facial swelling, sinus pain, sneezing, sore throat and voice change.   Respiratory:  Positive for cough, chest tightness, shortness of breath and wheezing.   Cardiovascular:  Negative for chest pain, palpitations and leg swelling.  Gastrointestinal:  Positive for nausea and vomiting (with cough drops).  Neurological:  Positive for headaches. Negative for dizziness and numbness.  Psychiatric/Behavioral: Negative.      Per HPI unless specifically indicated above     Objective:    LMP  (LMP Unknown) Comment: age 75  Wt Readings from Last 3 Encounters:  02/26/23 162 lb 9.6 oz (73.8 kg)  01/24/23 163 lb 9.6 oz (74.2 kg)  12/27/22 163 lb 6.4 oz (74.1 kg)    Physical Exam Vitals and nursing note reviewed.  Constitutional:      General: She is awake. She is not in acute distress.    Appearance: She is well-developed. She is ill-appearing. She is not  toxic-appearing.  HENT:     Head: Normocephalic.     Right Ear: Hearing normal.     Left Ear: Hearing normal.     Ears:     Comments: Reports pressure/pain frontal sinus area. Eyes:     General: Lids are normal.        Right eye: No discharge.        Left eye: No discharge.     Conjunctiva/sclera: Conjunctivae normal.   Pulmonary:     Effort: Pulmonary effort is normal. No accessory muscle usage or respiratory distress.     Comments: Slight hoarseness and coarse cough present. Musculoskeletal:     Cervical back: Normal range of motion.  Neurological:     Mental Status: She is alert and oriented to person, place, and time.  Psychiatric:        Attention and Perception: Attention normal.        Mood and Affect: Mood normal.        Behavior: Behavior normal. Behavior is cooperative.        Thought Content: Thought content normal.        Judgment: Judgment normal.    Results for orders placed or performed in visit on 02/26/23  Lipid Panel w/o Chol/HDL Ratio   Collection Time: 02/26/23 11:09 AM  Result Value Ref Range   Cholesterol, Total 157 100 - 199 mg/dL   Triglycerides 696 (H) 0 - 149 mg/dL   HDL 44 >29 mg/dL   VLDL Cholesterol Cal 32 5 - 40 mg/dL   LDL Chol Calc (NIH) 81 0 - 99 mg/dL  Comp Met (CMET)   Collection Time: 02/26/23 11:09 AM  Result Value Ref Range   Glucose 114 (H) 70 - 99 mg/dL   BUN 14 8 - 27 mg/dL   Creatinine, Ser 5.28 0.57 - 1.00 mg/dL   eGFR 95 >41 LK/GMW/1.02   BUN/Creatinine Ratio 23 12 - 28   Sodium 140 134 - 144 mmol/L   Potassium 3.8 3.5 - 5.2 mmol/L   Chloride 99 96 - 106 mmol/L   CO2 26 20 - 29 mmol/L   Calcium 9.4 8.7 - 10.3 mg/dL   Total Protein 6.6 6.0 - 8.5 g/dL   Albumin 4.1 3.8 - 4.8 g/dL   Globulin, Total 2.5 1.5 - 4.5 g/dL   Bilirubin Total 0.6 0.0 - 1.2 mg/dL   Alkaline Phosphatase 42 (L) 44 - 121 IU/L   AST 14 0 - 40 IU/L   ALT 18 0 - 32 IU/L  Vitamin B12   Collection Time: 02/26/23 11:09 AM  Result Value Ref Range   Vitamin B-12 482 232 - 1,245 pg/mL  Bayer DCA Hb A1c Waived (STAT)   Collection Time: 02/26/23 11:10 AM  Result Value Ref Range   HB A1C (BAYER DCA - WAIVED) 7.2 (H) 4.8 - 5.6 %  Microalbumin, Urine Waived (STAT)   Collection Time: 02/26/23 11:10 AM  Result Value Ref Range   Microalb, Ur Waived 30 (H) 0 - 19 mg/L    Creatinine, Urine Waived 300 10 - 300 mg/dL   Microalb/Creat Ratio <30 <30 mg/g      Assessment & Plan:   Problem List Items Addressed This Visit       Respiratory   Sinusitis - Primary   Acute for 1 1/2 weeks and worsening.  Will start Doxycyline 100 MG BID for 7 days.  Prednisone 40 MG for 5 days, she is to monitor sugar closely with this.  Albuterol inhaler refills  sent.  Recommend: - Increased rest - Increasing Fluids - Acetaminophen as needed for fever/pain.  - Salt water gargling, chloraseptic spray and throat lozenges - Mucinex.  - Humidifying the air.  Return if worsening or ongoing symptoms.      Relevant Medications   doxycycline (VIBRA-TABS) 100 MG tablet   predniSONE (DELTASONE) 20 MG tablet     Follow up plan: Return if symptoms worsen or fail to improve.

## 2023-05-02 NOTE — Assessment & Plan Note (Signed)
 Acute for 1 1/2 weeks and worsening.  Will start Doxycyline 100 MG BID for 7 days.  Prednisone 40 MG for 5 days, she is to monitor sugar closely with this.  Albuterol inhaler refills sent.  Recommend: - Increased rest - Increasing Fluids - Acetaminophen as needed for fever/pain.  - Salt water gargling, chloraseptic spray and throat lozenges - Mucinex.  - Humidifying the air.  Return if worsening or ongoing symptoms.

## 2023-05-02 NOTE — Patient Instructions (Signed)

## 2023-05-14 ENCOUNTER — Other Ambulatory Visit: Payer: Self-pay | Admitting: Nurse Practitioner

## 2023-05-14 ENCOUNTER — Ambulatory Visit: Payer: 59 | Admitting: Dermatology

## 2023-05-14 NOTE — Telephone Encounter (Unsigned)
 Copied from CRM [id number]. Topic: Clinical - Medication Refill >> May 14, 2023  2:17 PM Carlatta H wrote: Most Recent Primary Care Visit:  Provider: Aura Dials T  Department: [facility]  Visit Type: ACUTE  Date: 05/02/2023  Medication: prednisone 20 mg doxycycline 100 mg   Has the patient contacted their pharmacy? No (Agent: If no, request that the patient contact the pharmacy for the refill. If patient does not wish to contact the pharmacy document the reason why and proceed with request.) (Agent: If yes, when and what did the pharmacy advise?)  Is this the correct pharmacy for this prescription? Yes If no, delete pharmacy and type the correct one.  This is the patient's preferred pharmacy:  [id number] - [place], [place] - [address] AT [facility] OF [address] [place] [address] [place] [address] Phone: [phone] Fax: [phone]   Has the prescription been filled recently? No  Is the patient out of the medication? Yes  Has the patient been seen for an appointment in the last year OR does the patient have an upcoming appointment? Yes  Can we respond through MyChart? Yes  Agent: Please be advised that Rx refills may take up to 3 business days. We ask that you follow-up with your pharmacy.

## 2023-05-16 NOTE — Telephone Encounter (Signed)
 Requested medications are due for refill today.  unsure  Requested medications are on the active medications list.  Yes - as historical  Last refill. unsure  Future visit scheduled.   yes  Notes to clinic.  Refill not delegated. Medications are historical.    Requested Prescriptions  Pending Prescriptions Disp Refills   doxycycline (ADOXA) 100 MG tablet      Sig: Take 1 tablet (100 mg total) by mouth 2 (two) times daily.     Off-Protocol Failed - 05/16/2023  7:59 AM      Failed - Medication not assigned to a protocol, review manually.      Failed - Valid encounter within last 12 months    Recent Outpatient Visits           2 weeks ago Acute non-recurrent frontal sinusitis   [facility] [place], Dorie Rank, NP       Future Appointments             In 2 weeks Marjie Skiff, NP [place] [facility] [place]           Off-Protocol Failed - 05/16/2023  7:59 AM      Failed - Medication not assigned to a protocol, review manually.      Failed - Valid encounter within last 12 months    Recent Outpatient Visits           2 weeks ago Acute non-recurrent frontal sinusitis   [facility] [place], Corrie Dandy T, NP       Future Appointments             In 2 weeks [place], Dorie Rank, NP [facility], [facility]             predniSONE (DELTASONE) 20 MG tablet      Sig: Take 1 tablet (20 mg total) by mouth daily with breakfast.     Not Delegated - Endocrinology:  Oral Corticosteroids Failed - 05/16/2023  7:59 AM      Failed - This refill cannot be delegated      Failed - Manual Review: Eye exam for IOP if prolonged treatment      Failed - Glucose (serum) in normal range and within 180 days    Glucose  Date Value Ref Range Status  02/26/2023 114 (H) 70 - 99 mg/dL Final  40/34/7425 956 (H) mg/dL Final    Comment:    38-75 NOTE: New Reference Range  04/26/14    Glucose, Bld  Date  Value Ref Range Status  02/12/2022 132 (H) 70 - 99 mg/dL Final    Comment:    Glucose reference range applies only to samples taken after fasting for at least 8 hours.   Glucose-Capillary  Date Value Ref Range Status  06/25/2016 182 (H) 65 - 99 mg/dL Final         Failed - Valid encounter within last 6 months    Recent Outpatient Visits           2 weeks ago Acute non-recurrent frontal sinusitis   [place] [facility] [place], Dorie Rank, NP       Future Appointments             In 2 weeks [place], Dorie Rank, NP [place], [facility]            Failed - Bone Mineral Density or Dexa Scan completed in the last 2  years      Passed - K in normal range and within 180 days    Potassium  Date Value Ref Range Status  02/26/2023 3.8 3.5 - 5.2 mmol/L Final  06/09/2014 3.3 (L) mmol/L Final    Comment:    3.5-5.1 NOTE: New Reference Range  04/26/14          Passed - Na in normal range and within 180 days    Sodium  Date Value Ref Range Status  02/26/2023 140 134 - 144 mmol/L Final  06/09/2014 136 mmol/L Final    Comment:    135-145 NOTE: New Reference Range  04/26/14          Passed - Last BP in normal range    BP Readings from Last 1 Encounters:  02/26/23 128/80         Signed Prescriptions Disp Refills   doxycycline (ADOXA) 100 MG tablet      Sig: Take 100 mg by mouth 2 (two) times daily.     There is no refill protocol information for this order     predniSONE (DELTASONE) 20 MG tablet      Sig: Take 20 mg by mouth daily with breakfast.     There is no refill protocol information for this order    Refused Prescriptions Disp Refills   loratadine (CLARITIN) 10 MG tablet 90 tablet 0    Sig: TAKE 1 TABLET(10 MG) BY MOUTH DAILY     Ear, Nose, and Throat:  Antihistamines 2 Failed - 05/16/2023  7:59 AM      Failed - Valid encounter within last 12 months    Recent Outpatient Visits           2 weeks ago Acute non-recurrent  frontal sinusitis   [place] [facility] [place], Dorie Rank, NP       Future Appointments             In 2 weeks [place], Dorie Rank, NP [facility], [facility]            Passed - Cr in normal range and within 360 days    Creatinine  Date Value Ref Range Status  06/09/2014 0.73 mg/dL Final    Comment:    1.19-1.47 NOTE: New Reference Range  04/26/14    Creatinine, Ser  Date Value Ref Range Status  02/26/2023 0.62 0.57 - 1.00 mg/dL Final

## 2023-05-16 NOTE — Telephone Encounter (Signed)
 Requested Prescriptions  Pending Prescriptions Disp Refills   doxycycline (ADOXA) 100 MG tablet      Sig: Take 1 tablet (100 mg total) by mouth 2 (two) times daily.     Off-Protocol Failed - 05/16/2023  7:59 AM      Failed - Medication not assigned to a protocol, review manually.      Failed - Valid encounter within last 12 months    Recent Outpatient Visits           2 weeks ago Acute non-recurrent frontal sinusitis   [place] [facility] [place], Dorie Rank, NP       Future Appointments             In 2 weeks Marjie Skiff, NP [facility], [facility]           Off-Protocol Failed - 05/16/2023  7:59 AM      Failed - Medication not assigned to a protocol, review manually.      Failed - Valid encounter within last 12 months    Recent Outpatient Visits           2 weeks ago Acute non-recurrent frontal sinusitis   [place] [facility] [place], Corrie Dandy T, NP       Future Appointments             In 2 weeks [place], Dorie Rank, NP [facility], [facility]             predniSONE (DELTASONE) 20 MG tablet      Sig: Take 1 tablet (20 mg total) by mouth daily with breakfast.     Not Delegated - Endocrinology:  Oral Corticosteroids Failed - 05/16/2023  7:59 AM      Failed - This refill cannot be delegated      Failed - Manual Review: Eye exam for IOP if prolonged treatment      Failed - Glucose (serum) in normal range and within 180 days    Glucose  Date Value Ref Range Status  02/26/2023 114 (H) 70 - 99 mg/dL Final  51/76/1607 371 (H) mg/dL Final    Comment:    06-26 NOTE: New Reference Range  04/26/14    Glucose, Bld  Date Value Ref Range Status  02/12/2022 132 (H) 70 - 99 mg/dL Final    Comment:    Glucose reference range applies only to samples taken after fasting for at least 8 hours.   Glucose-Capillary  Date Value Ref Range Status  06/25/2016 182 (H) 65 - 99 mg/dL Final          Failed - Valid encounter within last 6 months    Recent Outpatient Visits           2 weeks ago Acute non-recurrent frontal sinusitis   [place] [facility] [place], Corrie Dandy T, NP       Future Appointments             In 2 weeks [place], Dorie Rank, NP [place] [facility]            Failed - Bone Mineral Density or Dexa Scan completed in the last 2 years      Passed - K in normal range and within 180 days    Potassium  Date Value Ref Range Status  02/26/2023 3.8 3.5 - 5.2 mmol/L Final  06/09/2014 3.3 (L) mmol/L Final    Comment:    3.5-5.1  NOTE: New Reference Range  04/26/14          Passed - Na in normal range and within 180 days    Sodium  Date Value Ref Range Status  02/26/2023 140 134 - 144 mmol/L Final  06/09/2014 136 mmol/L Final    Comment:    135-145 NOTE: New Reference Range  04/26/14          Passed - Last BP in normal range    BP Readings from Last 1 Encounters:  02/26/23 128/80         Signed Prescriptions Disp Refills   doxycycline (ADOXA) 100 MG tablet      Sig: Take 100 mg by mouth 2 (two) times daily.     There is no refill protocol information for this order     predniSONE (DELTASONE) 20 MG tablet      Sig: Take 20 mg by mouth daily with breakfast.     There is no refill protocol information for this order    Refused Prescriptions Disp Refills   loratadine (CLARITIN) 10 MG tablet 90 tablet 0    Sig: TAKE 1 TABLET(10 MG) BY MOUTH DAILY     Ear, Nose, and Throat:  Antihistamines 2 Failed - 05/16/2023  7:59 AM      Failed - Valid encounter within last 12 months    Recent Outpatient Visits           2 weeks ago Acute non-recurrent frontal sinusitis   [place] [facility] [place], Dorie Rank, NP       Future Appointments             In 2 weeks [place], Dorie Rank, NP [facility], [facility]            Passed - Cr in normal range and within 360 days     Creatinine  Date Value Ref Range Status  06/09/2014 0.73 mg/dL Final    Comment:    9.60-4.54 NOTE: New Reference Range  04/26/14    Creatinine, Ser  Date Value Ref Range Status  02/26/2023 0.62 0.57 - 1.00 mg/dL Final

## 2023-05-31 DIAGNOSIS — E119 Type 2 diabetes mellitus without complications: Secondary | ICD-10-CM | POA: Insufficient documentation

## 2023-05-31 NOTE — Patient Instructions (Signed)

## 2023-06-03 ENCOUNTER — Encounter: Payer: Self-pay | Admitting: Nurse Practitioner

## 2023-06-03 ENCOUNTER — Ambulatory Visit: Payer: Self-pay | Admitting: Nurse Practitioner

## 2023-06-03 VITALS — BP 120/65 | HR 97 | Temp 98.3°F | Ht 65.0 in | Wt 160.2 lb

## 2023-06-03 DIAGNOSIS — E785 Hyperlipidemia, unspecified: Secondary | ICD-10-CM | POA: Diagnosis not present

## 2023-06-03 DIAGNOSIS — E1165 Type 2 diabetes mellitus with hyperglycemia: Secondary | ICD-10-CM

## 2023-06-03 DIAGNOSIS — I152 Hypertension secondary to endocrine disorders: Secondary | ICD-10-CM

## 2023-06-03 DIAGNOSIS — F5101 Primary insomnia: Secondary | ICD-10-CM | POA: Diagnosis not present

## 2023-06-03 DIAGNOSIS — E1159 Type 2 diabetes mellitus with other circulatory complications: Secondary | ICD-10-CM

## 2023-06-03 DIAGNOSIS — L84 Corns and callosities: Secondary | ICD-10-CM | POA: Insufficient documentation

## 2023-06-03 DIAGNOSIS — E119 Type 2 diabetes mellitus without complications: Secondary | ICD-10-CM

## 2023-06-03 DIAGNOSIS — Z7984 Long term (current) use of oral hypoglycemic drugs: Secondary | ICD-10-CM

## 2023-06-03 DIAGNOSIS — E1169 Type 2 diabetes mellitus with other specified complication: Secondary | ICD-10-CM

## 2023-06-03 DIAGNOSIS — K219 Gastro-esophageal reflux disease without esophagitis: Secondary | ICD-10-CM | POA: Diagnosis not present

## 2023-06-03 LAB — BAYER DCA HB A1C WAIVED: HB A1C (BAYER DCA - WAIVED): 8.1 % — ABNORMAL HIGH (ref 4.8–5.6)

## 2023-06-03 NOTE — Assessment & Plan Note (Signed)
 Chronic, ongoing. Continue current medication regimen Trazodone 100 mg at bedtime as needed. Continue to focus on sleep hygiene techniques. Notify provider of any adverse reactions with this medication. Return in 3 months.

## 2023-06-03 NOTE — Assessment & Plan Note (Addendum)
 Chronic, ongoing. Current A1c 8.1%. Continue current medication regimen Rybelsus 7 mg and Metformin 1000 mg. Discussed with patient the importance of diet and exercise. Recommend eating smaller higher protein, low in fat meals more frequently and exercising at least 5 times per week for at least 30 min per day. Explained to patient will recheck A1c in 3 months and if continues to trend up will likely have to go on long acting insulin. Patient verbalized understanding and stated that she will begin taking the Metformin BID again. Return in 3 months.

## 2023-06-03 NOTE — Assessment & Plan Note (Signed)
 Chronic, ongoing. Continue current medication regimen Atorvastatin 40 mg. Will adjust as needed. Lipid panel today. Return in 3 months.

## 2023-06-03 NOTE — Assessment & Plan Note (Signed)
 Chronic, ongoing. Continue current medication regimen Prilosec 40 mg PRN. Patient understands the risk involved with the use of a PPI including bone loss, C-Diff diarrhea, pneumonia, infections, CKD, and electrolyte abnormalities. Magnesium level checked today.

## 2023-06-03 NOTE — Progress Notes (Deleted)
 BP 120/65   Pulse 97   Temp 98.3 F (36.8 C) (Oral)   Ht 5\' 5"  (1.651 m)   Wt 160 lb 3.2 oz (72.7 kg)   LMP  (LMP Unknown) Comment: age 72  SpO2 97%   BMI 26.66 kg/m    Subjective:    Patient ID: Cheryl Mendez, female    DOB: May 23, 1950, 73 y.o.   MRN: [id number]  HPI: Cheryl Mendez is a 73 y.o. female  Chief Complaint  Patient presents with   Diabetes    Eye exam requested from Dr. Alvester Morin    Gastroesophageal Reflux   Hyperlipidemia   Hypertension   DIABETES A1c in January was 7.2%.  Takes Rybelsus 7 MG (higher doses made her nauseous and amylase/lipase increased) and Metformin 1000 MG daily (only tolerates daily dosing).  Takes B12 for low levels in past and Lyrica for neuropathy discomfort which offers benefit.  History: Trulicity with GI issues and Invokana stopped due to foot concerns + Jardiance made her sick.  Glipizide made her feel unwell and sugars too low.  Hypoglycemic episodes:{Blank single:19197::"yes","no"} Polydipsia/polyuria: {Blank single:19197::"yes","no"} Visual disturbance: {Blank single:19197::"yes","no"} Chest pain: {Blank single:19197::"yes","no"} Paresthesias: {Blank single:19197::"yes","no"} Glucose Monitoring: {Blank single:19197::"yes","no"}  Accucheck frequency: {Blank single:19197::"Not Checking","Daily","BID","TID"}  Fasting glucose:  Post prandial:  Evening:  Before meals: Taking Insulin?: {Blank single:19197::"yes","no"}  Long acting insulin:  Short acting insulin: Blood Pressure Monitoring: {Blank single:19197::"not checking","rarely","daily","weekly","monthly","a few times a day","a few times a week","a few times a month"} Retinal Examination: {Blank single:19197::"Up to Date","Not up to Date"} Foot Exam: {Blank single:19197::"Up to Date","Not up to Date"} Diabetic Education: {Blank single:19197::"Completed","Not Completed"} Pneumovax: {Blank single:19197::"Up to Date","Not up to Date","unknown"} Influenza: {Blank single:19197::"Up  to Date","Not up to Date","unknown"} Aspirin: {Blank single:19197::"yes","no"}   HYPERTENSION / HYPERLIPIDEMIA Currently taking Lotensin 20-25 and Atorvastatin 10 MG.  Satisfied with current treatment? {Blank single:19197::"yes","no"} Duration of hypertension: {Blank single:19197::"chronic","months","years"} BP monitoring frequency: {Blank single:19197::"not checking","rarely","daily","weekly","monthly","a few times a day","a few times a week","a few times a month"} BP range:  BP medication side effects: {Blank single:19197::"yes","no"} Duration of hyperlipidemia: {Blank single:19197::"chronic","months","years"} Cholesterol medication side effects: {Blank single:19197::"yes","no"} Cholesterol supplements: {Blank multiple:19196::"none","fish oil","niacin","red yeast rice"} Medication compliance: {Blank single:19197::"excellent compliance","good compliance","fair compliance","poor compliance"} Aspirin: {Blank single:19197::"yes","no"} Recent stressors: {Blank single:19197::"yes","no"} Recurrent headaches: {Blank single:19197::"yes","no"} Visual changes: {Blank single:19197::"yes","no"} Palpitations: {Blank single:19197::"yes","no"} Dyspnea: {Blank single:19197::"yes","no"} Chest pain: {Blank single:19197::"yes","no"} Lower extremity edema: {Blank single:19197::"yes","no"} Dizzy/lightheaded: {Blank single:19197::"yes","no"}   GERD Continues Omeprazole for heart burn due Rybelsus and Linzess for constipation. GERD control status: {Blank single:19197::"controlled","uncontrolled","better","worse","exacerbated","stable"} Satisfied with current treatment? {Blank single:19197::"yes","no"} Heartburn frequency:  Medication side effects: {Blank single:19197::"yes","no"}  Medication compliance: {Blank multiple:19196::"better","worse","stable","fluctuating"} Previous GERD medications: Antacid use frequency:   Duration:  Nature:  Location:  Heartburn duration:  Alleviatiating factors:    Aggravating factors:  Dysphagia: {Blank single:19197::"yes","no"} Odynophagia:  {Blank single:19197::"yes","no"} Hematemesis: {Blank single:19197::"yes","no"} Blood in stool: {Blank single:19197::"yes","no"} EGD: {Blank single:19197::"yes","no"}   Relevant past medical, surgical, family and social history reviewed and updated as indicated. Interim medical history since our last visit reviewed. Allergies and medications reviewed and updated.  Review of Systems  Per HPI unless specifically indicated above     Objective:    BP 120/65   Pulse 97   Temp 98.3 F (36.8 C) (Oral)   Ht 5\' 5"  (1.651 m)   Wt 160 lb 3.2 oz (72.7 kg)   LMP  (LMP Unknown) Comment: age 48  SpO2 97%   BMI 26.66 kg/m   Wt Readings from Last 3 Encounters:  06/03/23 160 lb 3.2 oz (72.7  kg)  02/26/23 162 lb 9.6 oz (73.8 kg)  01/24/23 163 lb 9.6 oz (74.2 kg)    Physical Exam  Results for orders placed or performed in visit on 02/26/23  Lipid Panel w/o Chol/HDL Ratio   Collection Time: 02/26/23 11:09 AM  Result Value Ref Range   Cholesterol, Total 157 100 - 199 mg/dL   Triglycerides 829 (H) 0 - 149 mg/dL   HDL 44 >56 mg/dL   VLDL Cholesterol Cal 32 5 - 40 mg/dL   LDL Chol Calc (NIH) 81 0 - 99 mg/dL  Comp Met (CMET)   Collection Time: 02/26/23 11:09 AM  Result Value Ref Range   Glucose 114 (H) 70 - 99 mg/dL   BUN 14 8 - 27 mg/dL   Creatinine, Ser 2.13 0.57 - 1.00 mg/dL   eGFR 95 >08 MV/HQI/6.96   BUN/Creatinine Ratio 23 12 - 28   Sodium 140 134 - 144 mmol/L   Potassium 3.8 3.5 - 5.2 mmol/L   Chloride 99 96 - 106 mmol/L   CO2 26 20 - 29 mmol/L   Calcium 9.4 8.7 - 10.3 mg/dL   Total Protein 6.6 6.0 - 8.5 g/dL   Albumin 4.1 3.8 - 4.8 g/dL   Globulin, Total 2.5 1.5 - 4.5 g/dL   Bilirubin Total 0.6 0.0 - 1.2 mg/dL   Alkaline Phosphatase 42 (L) 44 - 121 IU/L   AST 14 0 - 40 IU/L   ALT 18 0 - 32 IU/L  Vitamin B12   Collection Time: 02/26/23 11:09 AM  Result Value Ref Range   Vitamin B-12 482 232  - 1,245 pg/mL  Bayer DCA Hb A1c Waived (STAT)   Collection Time: 02/26/23 11:10 AM  Result Value Ref Range   HB A1C (BAYER DCA - WAIVED) 7.2 (H) 4.8 - 5.6 %  Microalbumin, Urine Waived (STAT)   Collection Time: 02/26/23 11:10 AM  Result Value Ref Range   Microalb, Ur Waived 30 (H) 0 - 19 mg/L   Creatinine, Urine Waived 300 10 - 300 mg/dL   Microalb/Creat Ratio <30 <30 mg/g      Assessment & Plan:   Problem List Items Addressed This Visit       Cardiovascular and Mediastinum   Hypertension associated with diabetes (HCC)   Relevant Orders   Bayer DCA Hb A1c Waived   Comprehensive metabolic panel with GFR   TSH     Digestive   GERD (gastroesophageal reflux disease)   Relevant Orders   Magnesium     Endocrine   Diabetes mellitus treated with oral medication (HCC)   Hyperlipidemia associated with type 2 diabetes mellitus (HCC)   Relevant Orders   Bayer DCA Hb A1c Waived   Comprehensive metabolic panel with GFR   Lipid Panel w/o Chol/HDL Ratio   Poorly controlled type 2 diabetes mellitus (HCC) - Primary   Relevant Orders   Bayer DCA Hb A1c Waived   Comprehensive metabolic panel with GFR     Other   Insomnia     Follow up plan: Return in about 3 months (around 09/02/2023) for T2DM, HTN/HLD.

## 2023-06-03 NOTE — Progress Notes (Addendum)
 BP 120/65   Pulse 97   Temp 98.3 F (36.8 C) (Oral)   Ht 5\' 5"  (1.651 m)   Wt 160 lb 3.2 oz (72.7 kg)   LMP  (LMP Unknown) Comment: age 73  SpO2 97%   BMI 26.66 kg/m    Subjective:    Patient ID: Cheryl Mendez, female    DOB: [birth year], 73 y.o.   MRN: [id number]  HPI: Cheryl Mendez is a 73 y.o. female   Chief Complaint  Patient presents with   Diabetes    Eye exam requested from Dr. Alvester Morin    Gastroesophageal Reflux   Hyperlipidemia   Hypertension   NOTE WRITTEN BY DNP STUDENT.  ASSESSMENT AND PLAN OF CARE REVIEWED WITH STUDENT, AGREE WITH ABOVE FINDINGS AND PLAN.   DIABETES A1c in January was 7.2%.  Takes Rybelsus 7 MG (higher doses made her nauseous and amylase/lipase increased) and Metformin 1000 MG daily (only tolerates daily dosing, but would like to increase if needed).  Takes B12 for low levels in past and Lyrica for neuropathy discomfort which offers benefit.  Does have a small area on the bottom of her right foot that she reports is painful to walk on.  History: Trulicity with GI issues and Invokana stopped due to foot concerns + Jardiance made her sick.  Glipizide made her feel unwell and sugars too low.  Hypoglycemic episodes:no Polydipsia/polyuria: no Visual disturbance: no Chest pain: no Paresthesias: no Glucose Monitoring: yes  Accucheck frequency: Daily  Fasting glucose: 159  Before meals: 180 Taking Insulin?: no Blood Pressure Monitoring: not checking Retinal Examination: Up to Date Foot Exam: Up to Date Diabetic Education: Completed Pneumovax: Up to Date Influenza: Up to Date Aspirin: yes   HYPERTENSION / HYPERLIPIDEMIA Currently taking Lotensin 20-25 and Atorvastatin 10 MG.  Satisfied with current treatment? yes Duration of hypertension: years BP monitoring frequency: not checking BP medication side effects: no Duration of hyperlipidemia: years Cholesterol medication side effects: no Cholesterol supplements: none Medication compliance:  good compliance Aspirin: yes Recent stressors: no Recurrent headaches: no Visual changes: no Palpitations: no Dyspnea: no Chest pain: no Lower extremity edema: no Dizzy/lightheaded: no   GERD Continues Omeprazole for heart burn due Rybelsus and Linzess for constipation. GERD control status: stable Satisfied with current treatment? yes Medication side effects: no  Medication compliance: stable Dysphagia: no Odynophagia:  no Hematemesis: no Blood in stool: no EGD:  in past    Relevant past medical, surgical, family and social history reviewed and updated as indicated. Interim medical history since our last visit reviewed. Allergies and medications reviewed and updated.  Review of Systems  Constitutional:  Negative for activity change, appetite change, diaphoresis, fatigue and fever.  HENT: Negative.    Respiratory:  Negative for cough, chest tightness, shortness of breath and wheezing.   Cardiovascular:  Negative for chest pain, palpitations and leg swelling.  Gastrointestinal: Negative.   Endocrine: Negative for cold intolerance, heat intolerance, polydipsia, polyphagia and polyuria.  Neurological: Negative.   Psychiatric/Behavioral:  Negative for decreased concentration, self-injury, sleep disturbance and suicidal ideas. The patient is not nervous/anxious.     Per HPI unless specifically indicated above     Objective:    BP 120/65   Pulse 97   Temp 98.3 F (36.8 C) (Oral)   Ht 5\' 5"  (1.651 m)   Wt 160 lb 3.2 oz (72.7 kg)   LMP  (LMP Unknown) Comment: age 26  SpO2 97%   BMI 26.66 kg/m   Wt Readings  from Last 3 Encounters:  06/03/23 160 lb 3.2 oz (72.7 kg)  02/26/23 162 lb 9.6 oz (73.8 kg)  01/24/23 163 lb 9.6 oz (74.2 kg)    Physical Exam Vitals and nursing note reviewed.  Constitutional:      General: She is awake. She is not in acute distress.    Appearance: She is well-developed and well-groomed. She is not ill-appearing or toxic-appearing.  HENT:      Head: Normocephalic.     Right Ear: Hearing and external ear normal.     Left Ear: Hearing and external ear normal.  Eyes:     General: Lids are normal.        Right eye: No discharge.        Left eye: No discharge.     Extraocular Movements: Extraocular movements intact.     Conjunctiva/sclera: Conjunctivae normal.     Pupils: Pupils are equal, round, and reactive to light.     Visual Fields: Right eye visual fields normal and left eye visual fields normal.  Neck:     Thyroid: No thyromegaly.     Vascular: No carotid bruit.  Cardiovascular:     Rate and Rhythm: Normal rate and regular rhythm.     Heart sounds: Normal heart sounds. No murmur heard.    No gallop.  Pulmonary:     Effort: Pulmonary effort is normal. No accessory muscle usage or respiratory distress.     Breath sounds: Normal breath sounds.  Abdominal:     General: Bowel sounds are normal. There is no distension.     Palpations: Abdomen is soft.     Tenderness: There is no abdominal tenderness.  Musculoskeletal:     Cervical back: Normal range of motion and neck supple.     Right lower leg: No edema.     Left lower leg: No edema.       Feet:  Lymphadenopathy:     Cervical: No cervical adenopathy.  Skin:    General: Skin is warm and dry.  Neurological:     Mental Status: She is alert and oriented to person, place, and time.     Deep Tendon Reflexes: Reflexes are normal and symmetric.     Reflex Scores:      Brachioradialis reflexes are 2+ on the right side and 2+ on the left side.      Patellar reflexes are 2+ on the right side and 2+ on the left side. Psychiatric:        Attention and Perception: Attention normal.        Mood and Affect: Mood normal.        Speech: Speech normal.        Behavior: Behavior normal. Behavior is cooperative.        Thought Content: Thought content normal.     Results for orders placed or performed in visit on 02/26/23  Lipid Panel w/o Chol/HDL Ratio   Collection Time:  02/26/23 11:09 AM  Result Value Ref Range   Cholesterol, Total 157 100 - 199 mg/dL   Triglycerides 161 (H) 0 - 149 mg/dL   HDL 44 >09 mg/dL   VLDL Cholesterol Cal 32 5 - 40 mg/dL   LDL Chol Calc (NIH) 81 0 - 99 mg/dL  Comp Met (CMET)   Collection Time: 02/26/23 11:09 AM  Result Value Ref Range   Glucose 114 (H) 70 - 99 mg/dL   BUN 14 8 - 27 mg/dL   Creatinine, Ser 6.04 0.57 -  1.00 mg/dL   eGFR 95 >30 QM/VHQ/4.69   BUN/Creatinine Ratio 23 12 - 28   Sodium 140 134 - 144 mmol/L   Potassium 3.8 3.5 - 5.2 mmol/L   Chloride 99 96 - 106 mmol/L   CO2 26 20 - 29 mmol/L   Calcium 9.4 8.7 - 10.3 mg/dL   Total Protein 6.6 6.0 - 8.5 g/dL   Albumin 4.1 3.8 - 4.8 g/dL   Globulin, Total 2.5 1.5 - 4.5 g/dL   Bilirubin Total 0.6 0.0 - 1.2 mg/dL   Alkaline Phosphatase 42 (L) 44 - 121 IU/L   AST 14 0 - 40 IU/L   ALT 18 0 - 32 IU/L  Vitamin B12   Collection Time: 02/26/23 11:09 AM  Result Value Ref Range   Vitamin B-12 482 232 - 1,245 pg/mL  Bayer DCA Hb A1c Waived (STAT)   Collection Time: 02/26/23 11:10 AM  Result Value Ref Range   HB A1C (BAYER DCA - WAIVED) 7.2 (H) 4.8 - 5.6 %  Microalbumin, Urine Waived (STAT)   Collection Time: 02/26/23 11:10 AM  Result Value Ref Range   Microalb, Ur Waived 30 (H) 0 - 19 mg/L   Creatinine, Urine Waived 300 10 - 300 mg/dL   Microalb/Creat Ratio <30 <30 mg/g      Assessment & Plan:   Problem List Items Addressed This Visit       Cardiovascular and Mediastinum   Hypertension associated with diabetes (HCC)   Chronic, stable. BP at goal today in the office. Continue current medication regimen Lotensin, this offers kidney protection. Recommend checking BP at home at least 3 mornings each week and documenting for provider to review. Labs: CMP. Return in 3 months.      Relevant Orders   Bayer DCA Hb A1c Waived   Comprehensive metabolic panel with GFR   TSH     Digestive   GERD (gastroesophageal reflux disease)   Chronic, ongoing. Continue current  medication regimen Prilosec 40 mg PRN. Patient understands the risk involved with the use of a PPI including bone loss, C-Diff diarrhea, pneumonia, infections, CKD, and electrolyte abnormalities. Magnesium level checked today.      Relevant Orders   Magnesium     Endocrine   Diabetes mellitus treated with oral medication (HCC)   Chronic, ongoing. Current A1c 8.1%. Continue current medication regimen Rybelsus 7 mg and Metformin 1000 mg. Discussed with patient the importance of diet and exercise. Recommend eating smaller higher protein, low in fat meals more frequently and exercising at least 5 times per week for at least 30 min per day. Explained to patient will recheck A1c in 3 months and if continues to trend up will likely have to go on long acting insulin. Patient verbalized understanding and stated that she will begin taking the Metformin BID again. Return in 3 months.      Hyperlipidemia associated with type 2 diabetes mellitus (HCC)   Chronic, ongoing. Continue current medication regimen Atorvastatin 40 mg. Will adjust as needed. Lipid panel today. Return in 3 months.      Relevant Orders   Bayer DCA Hb A1c Waived   Comprehensive metabolic panel with GFR   Lipid Panel w/o Chol/HDL Ratio   Poorly controlled type 2 diabetes mellitus (HCC) - Primary   Chronic, ongoing. A1c trending up 8.1% today. Continue current medication regimen Rybelsus 7 mg and Metformin 1000 mg. Goal of BS 120-130. Discussed with patient the importance of diet and exercise. Recommend eating smaller higher protein, low  in fat meals more frequently and exercising at least 5 times per week for at least 30 min per day. Explained to patient will recheck A1c in 3 months and if continues to trend up will likely have to go on long acting insulin. Patient verbalized understanding and stated that she will begin taking the Metformin BID again. She is aware if ongoing elevations may need insulin. - Referral to podiatry placed to  assess callus      Relevant Orders   Bayer DCA Hb A1c Waived   Comprehensive metabolic panel with GFR   Ambulatory referral to Podiatry     Other   Insomnia   Chronic, ongoing. Continue current medication regimen Trazodone 100 mg at bedtime as needed. Continue to focus on sleep hygiene techniques. Notify provider of any adverse reactions with this medication. Return in 3 months.        Follow up plan: Return in about 3 months (around 09/02/2023) for T2DM, HTN/HLD.

## 2023-06-03 NOTE — Assessment & Plan Note (Signed)
 Patient reports pain to the bottom lateral side of right foot. Pain worse when walking. On examination appears to be a Corn. Referral sent to Podiatry. Educated patient on the importance of daily foot exams due to diabetes. Patient verbalized understanding of this education.

## 2023-06-03 NOTE — Assessment & Plan Note (Addendum)
 Chronic, ongoing. A1c trending up 8.1% today. Continue current medication regimen Rybelsus 7 mg and Metformin 1000 mg. Goal of BS 120-130. Discussed with patient the importance of diet and exercise. Recommend eating smaller higher protein, low in fat meals more frequently and exercising at least 5 times per week for at least 30 min per day. Explained to patient will recheck A1c in 3 months and if continues to trend up will likely have to go on long acting insulin. Patient verbalized understanding and stated that she will begin taking the Metformin BID again. She is aware if ongoing elevations may need insulin. - Referral to podiatry placed to assess callus

## 2023-06-03 NOTE — Assessment & Plan Note (Signed)
 Chronic, stable. BP at goal today in the office. Continue current medication regimen Lotensin, this offers kidney protection. Recommend checking BP at home at least 3 mornings each week and documenting for provider to review. Labs: CMP. Return in 3 months.

## 2023-06-04 LAB — COMPREHENSIVE METABOLIC PANEL WITH GFR
ALT: 15 IU/L (ref 0–32)
AST: 17 IU/L (ref 0–40)
Albumin: 4.3 g/dL (ref 3.8–4.8)
Alkaline Phosphatase: 47 IU/L (ref 44–121)
BUN/Creatinine Ratio: 29 — ABNORMAL HIGH (ref 12–28)
BUN: 20 mg/dL (ref 8–27)
Bilirubin Total: 0.5 mg/dL (ref 0.0–1.2)
CO2: 24 mmol/L (ref 20–29)
Calcium: 9.7 mg/dL (ref 8.7–10.3)
Chloride: 97 mmol/L (ref 96–106)
Creatinine, Ser: 0.68 mg/dL (ref 0.57–1.00)
Globulin, Total: 2.3 g/dL (ref 1.5–4.5)
Glucose: 150 mg/dL — ABNORMAL HIGH (ref 70–99)
Potassium: 3.9 mmol/L (ref 3.5–5.2)
Sodium: 139 mmol/L (ref 134–144)
Total Protein: 6.6 g/dL (ref 6.0–8.5)
eGFR: 92 mL/min/{1.73_m2} (ref >59)

## 2023-06-04 LAB — TSH: TSH: 1.47 u[IU]/mL (ref 0.450–4.500)

## 2023-06-04 LAB — LIPID PANEL W/O CHOL/HDL RATIO
Cholesterol, Total: 140 mg/dL (ref 100–199)
HDL: 41 mg/dL (ref >39)
LDL Chol Calc (NIH): 69 mg/dL (ref 0–99)
Triglycerides: 179 mg/dL — ABNORMAL HIGH (ref 0–149)
VLDL Cholesterol Cal: 30 mg/dL (ref 5–40)

## 2023-06-04 LAB — MAGNESIUM: Magnesium: 1.5 mg/dL — ABNORMAL LOW (ref 1.6–2.3)

## 2023-06-04 NOTE — Progress Notes (Signed)
 Good afternoon, please let Yohana know her labs have returned and overall remain stable with exception of magnesium which is a little low.  I recommend increasing magnesium rich foods like nuts and sugar free peanut butter.  We will recheck next visit.  Any questions? Keep being stellar!!  Thank you for allowing me to participate in your care.  I appreciate you. Kindest regards, Nayson Traweek

## 2023-06-05 ENCOUNTER — Telehealth: Payer: Self-pay | Admitting: Nurse Practitioner

## 2023-06-05 DIAGNOSIS — L989 Disorder of the skin and subcutaneous tissue, unspecified: Secondary | ICD-10-CM

## 2023-06-05 NOTE — Telephone Encounter (Signed)
 Copied from CRM [id number]. Topic: Referral - Request for Referral >> Jun 04, 2023 10:47 AM Baldemar Lev wrote: Pt has a message she wants to leave for her PCP:  "Yesterday I was there and I forgot to tell her that I need to get an appt to see [facility]. She knows what it is all about. Last year I went to the one in [place] and I did not like it, I want to go to [place]."   Did the patient discuss referral with their provider in the last year? Yes (If No - schedule appointment) (If Yes - send message)  Appointment offered? No  Type of order/referral and detailed reason for visit: Dermatology   Preference of office, provider, location: [facility]   If referral order, have you been seen by this specialty before? Yes. [place] (If Yes, this issue or another issue? When? Where?  Can we respond through MyChart? Yes   Pt is requesting a call back for updates

## 2023-06-05 NOTE — Telephone Encounter (Signed)
 Can we refer the patient to dermatology?

## 2023-06-13 ENCOUNTER — Telehealth: Payer: Self-pay

## 2023-06-13 NOTE — Telephone Encounter (Signed)
 Copied from CRM [id number]. Topic: Clinical - Request for Lab/Test Order >> Jun 13, 2023 10:07 AM Baldemar Lev wrote: Reason for CRM: Pt wants to have a covid test, no appts available today. Pt has had a known positive exposure. Seeking test today   Best contact:[phone]

## 2023-06-16 ENCOUNTER — Encounter: Payer: Self-pay | Admitting: Nurse Practitioner

## 2023-06-16 ENCOUNTER — Ambulatory Visit (INDEPENDENT_AMBULATORY_CARE_PROVIDER_SITE_OTHER): Admitting: Nurse Practitioner

## 2023-06-16 VITALS — BP 109/72 | HR 98 | Temp 99.0°F | Ht 65.0 in | Wt 155.6 lb

## 2023-06-16 DIAGNOSIS — Z20822 Contact with and (suspected) exposure to covid-19: Secondary | ICD-10-CM

## 2023-06-16 DIAGNOSIS — R000 Tachycardia, unspecified: Secondary | ICD-10-CM | POA: Insufficient documentation

## 2023-06-16 NOTE — Assessment & Plan Note (Addendum)
 Noted on exam today, ?related to illness.  EKG obtained and overall reassuring with sinus tachy noted but no other findings.  Will continue to monitor, elevation possibly related to illness recently.  If ongoing will obtain labs and consider BB if symptomatic.  Currently no symptoms with this.

## 2023-06-16 NOTE — Patient Instructions (Signed)

## 2023-06-16 NOTE — Progress Notes (Signed)
 BP 109/72   Pulse 98   Temp 99 F (37.2 C) (Oral)   Ht 5\' 5"  (1.651 m)   Wt 155 lb 9.6 oz (70.6 kg)   LMP  (LMP Unknown) Comment: age 73  SpO2 98%   BMI 25.89 kg/m    Subjective:    Patient ID: ALIXIS KUTCH, female    DOB: 07-30-1950, 73 y.o.   MRN: [id number]  HPI: Cheryl Mendez is a 73 y.o. female  Chief Complaint  Patient presents with   Covid Exposure    Patient states she was exposed to her sister, who tested positive for Covid about a week and half ago. States she was around her twice. States at the end of last week, she started not feeling well. Denies coughing, congestion, sore throat, etc.    COVID EXPOSURE Exposed from sister who was positive about a week ago.  Started with symptoms about 5-6 days ago.  Her sister was in hospital for Covid. Recent A1c was 8.1%. States only felt bad, some weakness, but symptoms are getting better. Fever: no Cough: yes only a little Shortness of breath: no Wheezing: no Chest pain: no Chest tightness: no Chest congestion: no Nasal congestion: yes Runny nose: yes Post nasal drip: yes Sneezing: no Sore throat: no Swollen glands: no Sinus pressure: no Headache: no Face pain: no Toothache: no Ear pain: none Ear pressure: none Eyes red/itching:no Eye drainage/crusting: no  Vomiting: no Rash: no Fatigue: yes Sick contacts: yes Strep contacts: no  Context: stable Recurrent sinusitis: no Relief with OTC cold/cough medications: yes  Treatments attempted: Claritin  and Tylenol    Relevant past medical, surgical, family and social history reviewed and updated as indicated. Interim medical history since our last visit reviewed. Allergies and medications reviewed and updated.  Review of Systems  Constitutional:  Positive for fatigue. Negative for activity change, appetite change and fever.  HENT:  Positive for congestion, postnasal drip and rhinorrhea. Negative for ear discharge, ear pain, facial swelling, sinus pressure,  sinus pain, sneezing and voice change.   Respiratory:  Positive for cough. Negative for chest tightness, shortness of breath and wheezing.   Cardiovascular:  Negative for chest pain, palpitations and leg swelling.  Gastrointestinal: Negative.   Neurological:  Negative for dizziness, numbness and headaches.  Psychiatric/Behavioral: Negative.      Per HPI unless specifically indicated above     Objective:    BP 109/72   Pulse 98   Temp 99 F (37.2 C) (Oral)   Ht 5\' 5"  (1.651 m)   Wt 155 lb 9.6 oz (70.6 kg)   LMP  (LMP Unknown) Comment: age 75  SpO2 98%   BMI 25.89 kg/m   Wt Readings from Last 3 Encounters:  06/16/23 155 lb 9.6 oz (70.6 kg)  06/03/23 160 lb 3.2 oz (72.7 kg)  02/26/23 162 lb 9.6 oz (73.8 kg)    Physical Exam Vitals and nursing note reviewed.  Constitutional:      General: She is awake. She is not in acute distress.    Appearance: She is well-developed and well-groomed. She is not ill-appearing or toxic-appearing.  HENT:     Head: Normocephalic.     Right Ear: Hearing, ear canal and external ear normal. A middle ear effusion is present. Tympanic membrane is not injected or perforated.     Left Ear: Hearing, ear canal and external ear normal. A middle ear effusion is present. Tympanic membrane is not injected or perforated.  Nose: No rhinorrhea.     Right Sinus: No maxillary sinus tenderness or frontal sinus tenderness.     Left Sinus: No maxillary sinus tenderness or frontal sinus tenderness.     Mouth/Throat:     Mouth: Mucous membranes are moist.     Pharynx: No pharyngeal swelling, oropharyngeal exudate or posterior oropharyngeal erythema.  Eyes:     General: Lids are normal.        Right eye: No discharge.        Left eye: No discharge.     Conjunctiva/sclera: Conjunctivae normal.     Pupils: Pupils are equal, round, and reactive to light.  Neck:     Thyroid : No thyromegaly.     Vascular: No carotid bruit.  Cardiovascular:     Rate and Rhythm:  Regular rhythm. Tachycardia present.     Heart sounds: Normal heart sounds. No murmur heard.    No gallop.  Pulmonary:     Effort: Pulmonary effort is normal. No accessory muscle usage or respiratory distress.     Breath sounds: Normal breath sounds. No decreased breath sounds, wheezing or rhonchi.  Abdominal:     General: Bowel sounds are normal.     Palpations: Abdomen is soft. There is no hepatomegaly or splenomegaly.  Musculoskeletal:     Cervical back: Normal range of motion and neck supple.     Right lower leg: No edema.     Left lower leg: No edema.  Lymphadenopathy:     Head:     Right side of head: No submental, submandibular, tonsillar, preauricular or posterior auricular adenopathy.     Left side of head: No submental, submandibular, tonsillar, preauricular or posterior auricular adenopathy.     Cervical: No cervical adenopathy.  Skin:    General: Skin is warm and dry.  Neurological:     Mental Status: She is alert and oriented to person, place, and time.  Psychiatric:        Attention and Perception: Attention normal.        Mood and Affect: Mood normal.        Speech: Speech normal.        Behavior: Behavior normal. Behavior is cooperative.        Thought Content: Thought content normal.   EKG My review and personal interpretation at Time: 1515 Indication: tachycardia Rate: 100  Rhythm: sinus tachy Axis: normal Other: No nonspecific st abn, no stemi, no lvh   Results for orders placed or performed in visit on 06/03/23  Bayer DCA Hb A1c Waived   Collection Time: 06/03/23  9:14 AM  Result Value Ref Range   HB A1C (BAYER DCA - WAIVED) 8.1 (H) 4.8 - 5.6 %  Comprehensive metabolic panel with GFR   Collection Time: 06/03/23  9:14 AM  Result Value Ref Range   Glucose 150 (H) 70 - 99 mg/dL   BUN 20 8 - 27 mg/dL   Creatinine, Ser 3.24 0.57 - 1.00 mg/dL   eGFR 92 >40 NU/UVO/5.36   BUN/Creatinine Ratio 29 (H) 12 - 28   Sodium 139 134 - 144 mmol/L   Potassium 3.9 3.5  - 5.2 mmol/L   Chloride 97 96 - 106 mmol/L   CO2 24 20 - 29 mmol/L   Calcium  9.7 8.7 - 10.3 mg/dL   Total Protein 6.6 6.0 - 8.5 g/dL   Albumin 4.3 3.8 - 4.8 g/dL   Globulin, Total 2.3 1.5 - 4.5 g/dL   Bilirubin Total 0.5 0.0 - 1.2  mg/dL   Alkaline Phosphatase 47 44 - 121 IU/L   AST 17 0 - 40 IU/L   ALT 15 0 - 32 IU/L  Lipid Panel w/o Chol/HDL Ratio   Collection Time: 06/03/23  9:14 AM  Result Value Ref Range   Cholesterol, Total 140 100 - 199 mg/dL   Triglycerides 409 (H) 0 - 149 mg/dL   HDL 41 >81 mg/dL   VLDL Cholesterol Cal 30 5 - 40 mg/dL   LDL Chol Calc (NIH) 69 0 - 99 mg/dL  TSH   Collection Time: 06/03/23  9:14 AM  Result Value Ref Range   TSH 1.470 0.450 - 4.500 uIU/mL  Magnesium   Collection Time: 06/03/23  9:14 AM  Result Value Ref Range   Magnesium 1.5 (L) 1.6 - 2.3 mg/dL      Assessment & Plan:   Problem List Items Addressed This Visit       Other   Tachycardia - Primary   Noted on exam today, ?related to illness.  EKG obtained and overall reassuring with sinus tachy noted but no other findings.  Will continue to monitor, elevation possibly related to illness recently.  If ongoing will obtain labs and consider BB if symptomatic.  Currently no symptoms with this.      Other Visit Diagnoses       Close exposure to COVID-19 virus       Minimal symptoms, will test for Covid.   Relevant Orders   Novel Coronavirus, NAA ([facility])        Follow up plan: Return for as scheduled July 16th.

## 2023-06-17 NOTE — Addendum Note (Signed)
 Addended by: Fronia Depass M on: 06/17/2023 10:47 AM   Modules accepted: Orders

## 2023-06-18 LAB — NOVEL CORONAVIRUS, NAA: SARS-CoV-2, NAA: NOT DETECTED

## 2023-06-25 ENCOUNTER — Encounter: Payer: Self-pay | Admitting: Podiatry

## 2023-06-25 ENCOUNTER — Ambulatory Visit: Admitting: Podiatry

## 2023-06-25 DIAGNOSIS — E1142 Type 2 diabetes mellitus with diabetic polyneuropathy: Secondary | ICD-10-CM | POA: Diagnosis not present

## 2023-06-25 DIAGNOSIS — L84 Corns and callosities: Secondary | ICD-10-CM

## 2023-06-25 DIAGNOSIS — Z7984 Long term (current) use of oral hypoglycemic drugs: Secondary | ICD-10-CM | POA: Diagnosis not present

## 2023-06-25 DIAGNOSIS — E119 Type 2 diabetes mellitus without complications: Secondary | ICD-10-CM

## 2023-06-25 NOTE — Patient Instructions (Addendum)
  VISIT SUMMARY: Today, we discussed the painful corn on your right foot and your diabetes management, particularly focusing on the neuropathy in your feet.  YOUR PLAN: -PAINFUL POROKERATOSIS: Porokeratosis is a skin condition that causes thickened, painful areas on the skin, often due to pressure or friction. You should apply salicylic acid cream to the affected area for 24 hours, then wash it off with soap and water. Be aware that this condition may recur, and you should return for further treatment if necessary.  -DIABETES WITH NEUROPATHY: Diabetic neuropathy is nerve damage caused by high blood sugar levels, leading to burning and tingling sensations in your feet. You should take pregabalin  (Lyrica ) consistently twice a day to manage the pain. We discussed the importance of gradually increasing the dose for better effectiveness and reassured you that severe side effects are rare, though you may experience drowsiness and dizziness.  INSTRUCTIONS: Please follow the treatment plan for your foot and take your medication as prescribed. If the pain in your foot persists or worsens, or if you experience any severe side effects from your medication, please schedule a follow-up appointment.                      Contains text generated by [employer]bridge.                                 Contains text generated by [employer]bridge.

## 2023-06-26 NOTE — Progress Notes (Signed)
 Subjective:  Patient ID: Cheryl Mendez, female    DOB: 04/23/1950,  MRN: [id number]  Chief Complaint  Patient presents with   Callouses    "This came up on my right foot, it hurts my whole foot." (5th met)   Foot Pain    "I get burning and tingling in my feet."  Diabetic  Dr. Javier Meter - saw 1.5 weeks ago, A1c - 7.2    Discussed the use of AI scribe software for clinical note transcription with the patient, who gave verbal consent to proceed.  History of Present Illness Cheryl Mendez is a 73 year old female with diabetes who presents with a painful corn on her right foot.  She describes the corn on her right foot as particularly deep and painful, causing discomfort regardless of footwear. She has a history of corns and bunions, which she has managed herself in the past, but this one is more severe.  She has diabetes, which she states is well controlled. She experiences burning and tingling sensations in her feet, attributed to diabetic neuropathy, which have worsened over the past year, especially since retiring from a job that involved extensive walking.  She recently started taking Lyrica  (pregabalin ) for neuropathic pain at a dose of 25 mg twice a day, but only takes it when the pain is severe, particularly before bed. She also takes trazodone  to help with numbness and sleep. She is concerned about potential side effects of Lyrica , such as seizures, but has not experienced these.  Her past work involved long hours on her feet, and she used to wear compression stockings provided by her employer, although she found them difficult to remove and did not wear them often. She maintains good circulation in her feet, as confirmed by previous checks, and generally manages her diabetes well, although she indulged in sweets during her birthday in [birth year].      Objective:    Physical Exam VASCULAR: DP and PT pulse palpable. Foot is warm and well-perfused. Capillary fill time is brisk.  Good circulation in toes. DERMATOLOGIC: Deep poriokeratosis on right foot, painful to pressure. Normal skin turgor, texture, and temperature. No open lesions, rashes, or ulcerations. NEUROLOGIC: Normal sensation to light touch and pressure. No paresthesias on examination. ORTHOPEDIC: Smooth pain-free range of motion of all examined joints. No ecchymosis or bruising. No gross deformity. No pain to palpation.   No images are attached to the encounter.    Results Procedure: Shaving of plantar wart Description: Shaving of the plantar wart on the bottom of the foot. Medicated cream (salicylic acid) applied to the area.  PATHOLOGY Dermatological examination: Deep and voluting porokeratosis on the right foot, painful to pressure (06/25/2023)   Assessment:   1. Corn of foot   2. Diabetes mellitus treated with oral medication (HCC)   3. Diabetic peripheral neuropathy (HCC)      Plan:  Patient was evaluated and treated and all questions answered.  Assessment and Plan Assessment & Plan Painful porokeratosis Presents with a painful, deep, involuting porokeratosis on the right foot, causing significant discomfort and impacting daily activities. Differential diagnosis includes a wart, but clinical presentation is consistent with porokeratosis. The lesion is painful to pressure. - Apply salicylic acid cream to the affected area for 24 hours. - Wash the area with soap and water after 24 hours. - Advise on potential recurrence and to return for further treatment if necessary.  Diabetes with neuropathy Diabetes with associated neuropathy, presenting with burning and tingling sensations  in the feet. Neuropathy has worsened over the past year. Currently on a low dose of pregabalin  (Lyrica ) used intermittently. Concerns about potential side effects, including rare seizures, with common side effects of drowsiness and dizziness. Neuropathy likely to progress with diabetes duration. - Advise consistent  use of pregabalin  (Lyrica ) twice daily for neuropathic pain management.  She will discuss with PCP - Discuss importance of dose titration for effectiveness. - Reassure about rarity of severe side effects and discuss common side effects such as drowsiness and dizziness.      Return if symptoms worsen or fail to improve.

## 2023-07-01 ENCOUNTER — Ambulatory Visit: Payer: Self-pay

## 2023-07-01 NOTE — Telephone Encounter (Signed)
 Patient will need an appointment to discuss with provider. Usually imaging of the area is done first before an injection is given. Please call to schedule patient.

## 2023-07-01 NOTE — Telephone Encounter (Signed)
 Chief Complaint: Knee pain, left Symptoms: difficulty ambulating, Frequency: worsening x 2 weeks Pertinent Negatives: Patient denies fever, CP, SOB Disposition: [] ED /[] Urgent Care (no appt availability in office) / [x] Appointment(In office/virtual)/ []  [facility]/ [] Home Care/ [] Refused Recommended Disposition /[] [facility]/ []  Follow-up with PCP Additional Notes: Pt reports she has been experiencing worsening pain in L knee x 2 weeks due to exacerbation of chronic pain. Pt notes pain at 10/10 with ambulation/stairs. No OV available within timeframe. Pt requesting OV for knee injection with PCP. This RN educated pt on home care, new-worsening symptoms, when to call back/seek emergent care. Pt verbalized understanding and agrees to plan.    Copied from CRM [id number]. Topic: Clinical - Red Word Triage >> Jul 01, 2023  2:45 PM Rachelle R wrote: Red Word that prompted transfer to Nurse Triage: Pain in her left knee. Feels like at times it is going to give out. States NP Alton Au has given her an injection for it before. Says the pain level now is the worst it has been. Reason for Disposition  [1] MODERATE pain (e.g., interferes with normal activities, limping) AND [2] present > 3 days  Answer Assessment - Initial Assessment Questions 1. LOCATION and RADIATION: "Where is the pain located?"      Knee pain, left 2. QUALITY: "What does the pain feel like?"  (e.g., sharp, dull, aching, burning)     "Everything" 3. SEVERITY: "How bad is the pain?" "What does it keep you from doing?"   (Scale 1-10; or mild, moderate, severe)   -  MILD (1-3): doesn't interfere with normal activities    -  MODERATE (4-7): interferes with normal activities (e.g., work or school) or awakens from sleep, limping    -  SEVERE (8-10): excruciating pain, unable to do any normal activities, unable to walk     10/10 with amulation 4. ONSET: "When did the pain start?" "Does it come and go, or is it there  all the time?"     Worsening last 2 weeks 5. RECURRENT: "Have you had this pain before?" If Yes, ask: "When, and what happened then?"     Chronic pain, exacerbation 7. AGGRAVATING FACTORS: "What makes the knee pain worse?" (e.g., walking, climbing stairs, running)     Walking stairs, ambulation 8. ASSOCIATED SYMPTOMS: "Is there any swelling or redness of the knee?"     None 9. OTHER SYMPTOMS: "Do you have any other symptoms?" (e.g., chest pain, difficulty breathing, fever, calf pain)     None  Protocols used: Knee Pain-A-AH

## 2023-07-02 NOTE — Telephone Encounter (Signed)
 Appointment has been made

## 2023-07-06 DIAGNOSIS — E119 Type 2 diabetes mellitus without complications: Secondary | ICD-10-CM | POA: Diagnosis not present

## 2023-07-06 DIAGNOSIS — M1712 Unilateral primary osteoarthritis, left knee: Secondary | ICD-10-CM | POA: Diagnosis not present

## 2023-07-08 ENCOUNTER — Encounter (INDEPENDENT_AMBULATORY_CARE_PROVIDER_SITE_OTHER): Payer: Self-pay

## 2023-07-10 DIAGNOSIS — H40153 Residual stage of open-angle glaucoma, bilateral: Secondary | ICD-10-CM | POA: Diagnosis not present

## 2023-07-10 DIAGNOSIS — E113393 Type 2 diabetes mellitus with moderate nonproliferative diabetic retinopathy without macular edema, bilateral: Secondary | ICD-10-CM | POA: Diagnosis not present

## 2023-07-13 NOTE — Patient Instructions (Incomplete)
 Be Involved in Caring For Your Health:  Taking Medications When medications are taken as directed, they can greatly improve your health. But if they are not taken as prescribed, they may not work. In some cases, not taking them correctly can be harmful. To help ensure your treatment remains effective and safe, understand your medications and how to take them. Bring your medications to each visit for review by your provider.  Your lab results, notes, and after visit summary will be available on My Chart. We strongly encourage you to use this feature. If lab results are abnormal the clinic will contact you with the appropriate steps. If the clinic does not contact you assume the results are satisfactory. You can always view your results on My Chart. If you have questions regarding your health or results, please contact the clinic during office hours. You can also ask questions on My Chart.  We at [facility] are grateful that you chose us  to provide your care. We strive to provide evidence-based and compassionate care and are always looking for feedback. If you get a survey from the clinic please complete this so we can hear your opinions.  Knee Injection A knee injection is a procedure to get medicine in your knee joint to help relieve the pain, swelling, and stiffness that is caused by arthritis. The medicine may also cushion your knee joint. Your health care provider will use a needle to inject the medicine. You may need more than one injection. Tell a health care provider about: Any allergies you have. All medicines you take. These include vitamins, herbs, eye drops, and creams. Any problems you or family members have had with anesthesia. Any bleeding problems you have. Any surgeries you've had. Any medical conditions you have. Whether you're pregnant or may be pregnant. What are the risks? Your provider will talk with you about risks. These may  include: Infection. Bleeding. Symptoms that get worse. Damage to the area around your knee. Allergic reaction to the medicines. Skin reactions from having many injections. What happens before the procedure? Ask about changing or stopping: Any medicines you take. Any vitamins, herbs, or supplements you take. Do not take aspirin or ibuprofen unless you're told to. Plan to have a responsible adult drive you home from the hospital or clinic. You won't be allowed to drive. What happens during the procedure?  You will sit or lie down in a position for your knee to be treated. The skin over your kneecap will be cleaned with a soap that kills germs. You will be given a medicine that numbs the area. You may feel some stinging. The medicine will be injected into your knee. The needle is placed between your kneecap and your knee. The medicine is injected into the joint space. The needle will be taken out. A bandage may be placed over the injection site. The procedure may vary among providers and hospitals. What can I expect after the procedure? Your blood pressure, heart rate, breathing rate, and blood oxygen level will be monitored until you leave the hospital or clinic. You may have to move your knee through its full range of motion. This helps to get the medicine into the joint space. You will be watched to make sure that you do not have a reaction to the injection. You may feel more pain, swelling, and warmth than you did before the injection. This reaction may last about 1-2 days. Follow these instructions at home: Medicines Take over-the-counter and prescription medicines  only as told by your provider. Ask your provider if the medicine prescribed to you requires you to avoid driving or using machinery. Do not take medicines such as aspirin and ibuprofen unless your provider tells you to. Injection site care Follow instructions from your provider about: How to take care of your injection  site. When and how you should change your bandage. When you should take off your bandage. Check your injection area every day for signs of infection. Check for: More redness, swelling, or pain after 2 days. Fluid or blood. Warmth. Pus or a bad smell. Managing pain, stiffness, and swelling  Use ice or an ice pack as told. Place a towel between your skin and the ice. Leave the ice on for 20 minutes, 2-3 times a day. If your skin turns red, take off the ice right away to prevent skin damage. The risk of damage is higher if you can't feel pain, heat, or cold. Do not put heat to your knee. Raise your knee above the level of your heart while you're sitting or lying down. General instructions If you have a bandage, keep it dry until your provider says it can be taken off. Ask your provider when you can start taking showers and baths. Avoid activities that take a lot of effort for as long as told by your provider. Ask what things are safe for you to do at home. Ask when you can go back to work or school. Keep all follow-up visits. You may need more injections. Contact a health care provider if: You have a fever. You have any signs of infection. Your symptoms at the injection site last longer than 2 days after your procedure. Get help right away if: Your knee turns very red. Your knee becomes very swollen. You have very bad knee pain. This information is not intended to replace advice given to you by your health care provider. Make sure you discuss any questions you have with your health care provider. Document Revised: 06/04/2022 Document Reviewed: 06/04/2022 Elsevier Patient Education  2024 [employer].

## 2023-07-15 ENCOUNTER — Ambulatory Visit: Admitting: Nurse Practitioner

## 2023-07-28 ENCOUNTER — Other Ambulatory Visit: Payer: Self-pay | Admitting: Nurse Practitioner

## 2023-07-29 NOTE — Telephone Encounter (Signed)
 Requested Prescriptions  Pending Prescriptions Disp Refills   benazepril -hydrochlorthiazide (LOTENSIN  HCT) 20-25 MG tablet [Pharmacy Med Name: BENAZEPRIL /HCTZ 20/25MG  TABLETS] 100 tablet     Sig: TAKE 1 TABLET BY MOUTH DAILY     Cardiovascular:  ACEI + Diuretic Combos Passed - 07/29/2023 11:46 AM      Passed - Na in normal range and within 180 days    Sodium  Date Value Ref Range Status  06/03/2023 139 134 - 144 mmol/L Final  06/09/2014 136 mmol/L Final    Comment:    135-145 NOTE: New Reference Range  04/26/14          Passed - K in normal range and within 180 days    Potassium  Date Value Ref Range Status  06/03/2023 3.9 3.5 - 5.2 mmol/L Final  06/09/2014 3.3 (L) mmol/L Final    Comment:    3.5-5.1 NOTE: New Reference Range  04/26/14          Passed - Cr in normal range and within 180 days    Creatinine  Date Value Ref Range Status  06/09/2014 0.73 mg/dL Final    Comment:    0.98-1.19 NOTE: New Reference Range  04/26/14    Creatinine, Ser  Date Value Ref Range Status  06/03/2023 0.68 0.57 - 1.00 mg/dL Final         Passed - eGFR is 30 or above and within 180 days    EGFR (African American)  Date Value Ref Range Status  06/09/2014 >60  Final   GFR calc Af Amer  Date Value Ref Range Status  03/28/2020 103 >59 mL/min/1.73 Final    Comment:    **In accordance with recommendations from the NKF-ASN Task force,**   [facility] is in the process of updating its eGFR calculation to the   2021 CKD-EPI creatinine equation that estimates kidney function   without a race variable.    EGFR (Non-African Amer.)  Date Value Ref Range Status  06/09/2014 >60  Final    Comment:    eGFR values <69mL/min/1.73 m2 may be an indication of chronic kidney disease (CKD). Calculated eGFR is useful in patients with stable renal function. The eGFR calculation will not be reliable in acutely ill patients when serum creatinine is changing rapidly. It is not useful in patients on  dialysis. The eGFR calculation may not be applicable to patients at the low and high extremes of body sizes, pregnant women, and vegetarians.    GFR, Estimated  Date Value Ref Range Status  02/12/2022 >60 >60 mL/min Final    Comment:    (NOTE) Calculated using the CKD-EPI Creatinine Equation (2021)    eGFR  Date Value Ref Range Status  06/03/2023 92 >59 mL/min/1.73 Final         Passed - Patient is not pregnant      Passed - Last BP in normal range    BP Readings from Last 1 Encounters:  06/16/23 109/72         Passed - Valid encounter within last 6 months    Recent Outpatient Visits           1 month ago Tachycardia   [place] [facility] [place], [place] T, NP   1 month ago Poorly controlled type 2 diabetes mellitus (HCC)   [facility] [place], Sanjuan Crumbly T, NP   2 months ago Acute non-recurrent frontal sinusitis   [place] [facility] [place], Lavelle Posey, NP

## 2023-08-05 ENCOUNTER — Other Ambulatory Visit: Payer: Self-pay | Admitting: Nurse Practitioner

## 2023-08-07 NOTE — Telephone Encounter (Signed)
 Requested Prescriptions  Pending Prescriptions Disp Refills   loratadine  (CLARITIN ) 10 MG tablet [Pharmacy Med Name: ALLERGY RELF (LORATADINE ) 10MG  TABS] 90 tablet 1    Sig: TAKE 1 TABLET(10 MG) BY MOUTH DAILY     Ear, Nose, and Throat:  Antihistamines 2 Passed - 08/07/2023  2:39 PM      Passed - Cr in normal range and within 360 days    Creatinine  Date Value Ref Range Status  06/09/2014 0.73 mg/dL Final    Comment:    5.40-9.81 NOTE: New Reference Range  04/26/14    Creatinine, Ser  Date Value Ref Range Status  06/03/2023 0.68 0.57 - 1.00 mg/dL Final         Passed - Valid encounter within last 12 months    Recent Outpatient Visits           1 month ago Tachycardia   [place] [facility] [place], Sanjuan Crumbly T, NP   2 months ago Poorly controlled type 2 diabetes mellitus (HCC)   [place] [facility] [place], [place] T, NP   3 months ago Acute non-recurrent frontal sinusitis   [place] [place], Lavelle Posey, NP

## 2023-08-23 ENCOUNTER — Other Ambulatory Visit: Payer: Self-pay | Admitting: Nurse Practitioner

## 2023-08-26 NOTE — Telephone Encounter (Signed)
 Requested Prescriptions  Pending Prescriptions Disp Refills   benazepril -hydrochlorthiazide (LOTENSIN  HCT) 20-25 MG tablet [Pharmacy Med Name: BENAZEPRIL /HCTZ 20/25MG  TABLETS] 90 tablet 0    Sig: TAKE 1 TABLET BY MOUTH DAILY     Cardiovascular:  ACEI + Diuretic Combos Passed - 08/26/2023 12:37 PM      Passed - Na in normal range and within 180 days    Sodium  Date Value Ref Range Status  06/03/2023 139 134 - 144 mmol/L Final  06/09/2014 136 mmol/L Final    Comment:    135-145 NOTE: New Reference Range  04/26/14          Passed - K in normal range and within 180 days    Potassium  Date Value Ref Range Status  06/03/2023 3.9 3.5 - 5.2 mmol/L Final  06/09/2014 3.3 (L) mmol/L Final    Comment:    3.5-5.1 NOTE: New Reference Range  04/26/14          Passed - Cr in normal range and within 180 days    Creatinine  Date Value Ref Range Status  06/09/2014 0.73 mg/dL Final    Comment:    9.55-8.99 NOTE: New Reference Range  04/26/14    Creatinine, Ser  Date Value Ref Range Status  06/03/2023 0.68 0.57 - 1.00 mg/dL Final         Passed - eGFR is 30 or above and within 180 days    EGFR (African American)  Date Value Ref Range Status  06/09/2014 >60  Final   GFR calc Af Amer  Date Value Ref Range Status  03/28/2020 103 >59 mL/min/1.73 Final    Comment:    **In accordance with recommendations from the NKF-ASN Task force,**   [facility] is in the process of updating its eGFR calculation to the   2021 CKD-EPI creatinine equation that estimates kidney function   without a race variable.    EGFR (Non-African Amer.)  Date Value Ref Range Status  06/09/2014 >60  Final    Comment:    eGFR values <50mL/min/1.73 m2 may be an indication of chronic kidney disease (CKD). Calculated eGFR is useful in patients with stable renal function. The eGFR calculation will not be reliable in acutely ill patients when serum creatinine is changing rapidly. It is not useful in patients on  dialysis. The eGFR calculation may not be applicable to patients at the low and high extremes of body sizes, pregnant women, and vegetarians.    GFR, Estimated  Date Value Ref Range Status  02/12/2022 >60 >60 mL/min Final    Comment:    (NOTE) Calculated using the CKD-EPI Creatinine Equation (2021)    eGFR  Date Value Ref Range Status  06/03/2023 92 >59 mL/min/1.73 Final         Passed - Patient is not pregnant      Passed - Last BP in normal range    BP Readings from Last 1 Encounters:  06/16/23 109/72         Passed - Valid encounter within last 6 months    Recent Outpatient Visits           2 months ago Tachycardia   [place] [facility] [place], [place] T, NP   2 months ago Poorly controlled type 2 diabetes mellitus (HCC)   [place] [facility] [place], [place] T, NP   3 months ago Acute non-recurrent frontal sinusitis   [place] [facility] [place], Melanie DASEN, NP

## 2023-08-30 ENCOUNTER — Other Ambulatory Visit: Payer: Self-pay | Admitting: Nurse Practitioner

## 2023-08-31 NOTE — Patient Instructions (Incomplete)

## 2023-09-01 ENCOUNTER — Other Ambulatory Visit: Payer: Self-pay | Admitting: Nurse Practitioner

## 2023-09-01 NOTE — Telephone Encounter (Signed)
 Requested Prescriptions  Refused Prescriptions Disp Refills   benazepril -hydrochlorthiazide (LOTENSIN  HCT) 20-25 MG tablet [Pharmacy Med Name: BENAZEPRIL /HCTZ 20/25MG  TABLETS] 90 tablet 0    Sig: TAKE 1 TABLET BY MOUTH DAILY     Cardiovascular:  ACEI + Diuretic Combos Passed - 09/01/2023  4:48 PM      Passed - Na in normal range and within 180 days    Sodium  Date Value Ref Range Status  06/03/2023 139 134 - 144 mmol/L Final  06/09/2014 136 mmol/L Final    Comment:    135-145 NOTE: New Reference Range  04/26/14          Passed - K in normal range and within 180 days    Potassium  Date Value Ref Range Status  06/03/2023 3.9 3.5 - 5.2 mmol/L Final  06/09/2014 3.3 (L) mmol/L Final    Comment:    3.5-5.1 NOTE: New Reference Range  04/26/14          Passed - Cr in normal range and within 180 days    Creatinine  Date Value Ref Range Status  06/09/2014 0.73 mg/dL Final    Comment:    9.55-8.99 NOTE: New Reference Range  04/26/14    Creatinine, Ser  Date Value Ref Range Status  06/03/2023 0.68 0.57 - 1.00 mg/dL Final         Passed - eGFR is 30 or above and within 180 days    EGFR (African American)  Date Value Ref Range Status  06/09/2014 >60  Final   GFR calc Af Amer  Date Value Ref Range Status  03/28/2020 103 >59 mL/min/1.73 Final    Comment:    **In accordance with recommendations from the NKF-ASN Task force,**   [facility] is in the process of updating its eGFR calculation to the   2021 CKD-EPI creatinine equation that estimates kidney function   without a race variable.    EGFR (Non-African Amer.)  Date Value Ref Range Status  06/09/2014 >60  Final    Comment:    eGFR values <52mL/min/1.73 m2 may be an indication of chronic kidney disease (CKD). Calculated eGFR is useful in patients with stable renal function. The eGFR calculation will not be reliable in acutely ill patients when serum creatinine is changing rapidly. It is not useful in patients on  dialysis. The eGFR calculation may not be applicable to patients at the low and high extremes of body sizes, pregnant women, and vegetarians.    GFR, Estimated  Date Value Ref Range Status  02/12/2022 >60 >60 mL/min Final    Comment:    (NOTE) Calculated using the CKD-EPI Creatinine Equation (2021)    eGFR  Date Value Ref Range Status  06/03/2023 92 >59 mL/min/1.73 Final         Passed - Patient is not pregnant      Passed - Last BP in normal range    BP Readings from Last 1 Encounters:  06/16/23 109/72         Passed - Valid encounter within last 6 months    Recent Outpatient Visits           2 months ago Tachycardia   [place] [facility] [place], [place] T, NP   3 months ago Poorly controlled type 2 diabetes mellitus (HCC)   [place] [facility] [place], [place] T, NP   4 months ago Acute non-recurrent frontal sinusitis   [place] [facility] [place], Melanie DASEN, NP  albuterol  (VENTOLIN  HFA) 108 (90 Base) MCG/ACT inhaler [Pharmacy Med Name: ALBUTEROL  HFA INH (200 PUFFS) 18GM] 18 g 3    Sig: INHALE 2 PUFFS INTO THE LUNGS EVERY 6 HOURS AS NEEDED FOR WHEEZING OR SHORTNESS OF BREATH     Pulmonology:  Beta Agonists 2 Passed - 09/01/2023  4:48 PM      Passed - Last BP in normal range    BP Readings from Last 1 Encounters:  06/16/23 109/72         Passed - Last Heart Rate in normal range    Pulse Readings from Last 1 Encounters:  06/16/23 98         Passed - Valid encounter within last 12 months    Recent Outpatient Visits           2 months ago Tachycardia   [place] [facility] [place], [place] T, NP   3 months ago Poorly controlled type 2 diabetes mellitus (HCC)   [place] [facility] [place], [place] T, NP   4 months ago Acute non-recurrent frontal sinusitis   [place] [facility] [place], Melanie DASEN, NP

## 2023-09-01 NOTE — Telephone Encounter (Signed)
 Copied from CRM [id number]. Topic: Clinical - Medication Refill >> Sep 01, 2023  9:20 AM Tiffany B wrote: Medication: benazepril -hydrochlorthiazide (LOTENSIN  HCT) 20-25 MG tablet and albuterol  (VENTOLIN  HFA) 108 (90 Base) MCG/ACT inhaler   Has the patient contacted their pharmacy? Yes on Friday 08/22/2023  (Agent: If yes, when and what did the pharmacy advise?) Contact PCP, pharmacy gave patient 3 pills of the benazepril   This is the patient's preferred pharmacy:  [id number] [place], [place] - [address] AT [facility] [address] [place] [address] [place] [address] Phone: [phone] Fax: [phone]  Is this the correct pharmacy for this prescription? Yes   Has the prescription been filled recently? Yes  Is the patient out of the medication? Yes  Has the patient been seen for an appointment in the last year OR does the patient have an upcoming appointment? Yes  Can we respond through MyChart? No  Agent: Please be advised that Rx refills may take up to 3 business days. We ask that you follow-up with your pharmacy.

## 2023-09-02 DIAGNOSIS — L570 Actinic keratosis: Secondary | ICD-10-CM | POA: Diagnosis not present

## 2023-09-02 DIAGNOSIS — L821 Other seborrheic keratosis: Secondary | ICD-10-CM | POA: Diagnosis not present

## 2023-09-02 MED ORDER — ALBUTEROL SULFATE HFA 108 (90 BASE) MCG/ACT IN AERS: 2.0000 | INHALATION_SPRAY | Freq: Four times a day (QID) | RESPIRATORY_TRACT | 3 refills | Status: AC | PRN

## 2023-09-02 NOTE — Telephone Encounter (Signed)
 Benazepril -hydrochlorothiazide - 08/26/23 #90 Requested Prescriptions  Pending Prescriptions Disp Refills   albuterol  (VENTOLIN  HFA) 108 (90 Base) MCG/ACT inhaler 18 g 3    Sig: Inhale 2 puffs into the lungs every 6 (six) hours as needed for wheezing or shortness of breath.     Pulmonology:  Beta Agonists 2 Passed - 09/02/2023  3:53 PM      Passed - Last BP in normal range    BP Readings from Last 1 Encounters:  06/16/23 109/72         Passed - Last Heart Rate in normal range    Pulse Readings from Last 1 Encounters:  06/16/23 98         Passed - Valid encounter within last 12 months    Recent Outpatient Visits           2 months ago Tachycardia   [place] [facility] [place], [place] T, NP   3 months ago Poorly controlled type 2 diabetes mellitus (HCC)   [place] [facility] [place], Irena T, NP   4 months ago Acute non-recurrent frontal sinusitis   [place] [facility] [place], Jolene T, NP              Refused Prescriptions Disp Refills   benazepril -hydrochlorthiazide (LOTENSIN  HCT) 20-25 MG tablet 90 tablet 0    Sig: Take 1 tablet by mouth daily.     Cardiovascular:  ACEI + Diuretic Combos Passed - 09/02/2023  3:53 PM      Passed - Na in normal range and within 180 days    Sodium  Date Value Ref Range Status  06/03/2023 139 134 - 144 mmol/L Final  06/09/2014 136 mmol/L Final    Comment:    135-145 NOTE: New Reference Range  04/26/14          Passed - K in normal range and within 180 days    Potassium  Date Value Ref Range Status  06/03/2023 3.9 3.5 - 5.2 mmol/L Final  06/09/2014 3.3 (L) mmol/L Final    Comment:    3.5-5.1 NOTE: New Reference Range  04/26/14          Passed - Cr in normal range and within 180 days    Creatinine  Date Value Ref Range Status  06/09/2014 0.73 mg/dL Final    Comment:    9.55-8.99 NOTE: New Reference Range  04/26/14    Creatinine, Ser  Date Value Ref Range Status   06/03/2023 0.68 0.57 - 1.00 mg/dL Final         Passed - eGFR is 30 or above and within 180 days    EGFR (African American)  Date Value Ref Range Status  06/09/2014 >60  Final   GFR calc Af Amer  Date Value Ref Range Status  03/28/2020 103 >59 mL/min/1.73 Final    Comment:    **In accordance with recommendations from the NKF-ASN Task force,**   [facility] is in the process of updating its eGFR calculation to the   2021 CKD-EPI creatinine equation that estimates kidney function   without a race variable.    EGFR (Non-African Amer.)  Date Value Ref Range Status  06/09/2014 >60  Final    Comment:    eGFR values <18mL/min/1.73 m2 may be an indication of chronic kidney disease (CKD). Calculated eGFR is useful in patients with stable renal function. The eGFR calculation will not be reliable in acutely ill patients when serum creatinine is changing rapidly. It is not  useful in patients on dialysis. The eGFR calculation may not be applicable to patients at the low and high extremes of body sizes, pregnant women, and vegetarians.    GFR, Estimated  Date Value Ref Range Status  02/12/2022 >60 >60 mL/min Final    Comment:    (NOTE) Calculated using the CKD-EPI Creatinine Equation (2021)    eGFR  Date Value Ref Range Status  06/03/2023 92 >59 mL/min/1.73 Final         Passed - Patient is not pregnant      Passed - Last BP in normal range    BP Readings from Last 1 Encounters:  06/16/23 109/72         Passed - Valid encounter within last 6 months    Recent Outpatient Visits           2 months ago Tachycardia   [place] [facility] [place], [place] T, NP   3 months ago Poorly controlled type 2 diabetes mellitus (HCC)   [place] [facility] [place], [place] T, NP   4 months ago Acute non-recurrent frontal sinusitis   [place] [facility] [place], Melanie DASEN, NP

## 2023-09-03 ENCOUNTER — Encounter: Payer: Self-pay | Admitting: Nurse Practitioner

## 2023-09-03 ENCOUNTER — Ambulatory Visit (INDEPENDENT_AMBULATORY_CARE_PROVIDER_SITE_OTHER): Admitting: Nurse Practitioner

## 2023-09-03 VITALS — BP 109/71 | HR 93 | Temp 98.1°F | Ht 65.0 in | Wt 157.6 lb

## 2023-09-03 DIAGNOSIS — Z7984 Long term (current) use of oral hypoglycemic drugs: Secondary | ICD-10-CM | POA: Diagnosis not present

## 2023-09-03 DIAGNOSIS — E119 Type 2 diabetes mellitus without complications: Secondary | ICD-10-CM | POA: Diagnosis not present

## 2023-09-03 DIAGNOSIS — E1169 Type 2 diabetes mellitus with other specified complication: Secondary | ICD-10-CM

## 2023-09-03 DIAGNOSIS — E785 Hyperlipidemia, unspecified: Secondary | ICD-10-CM | POA: Diagnosis not present

## 2023-09-03 DIAGNOSIS — E1165 Type 2 diabetes mellitus with hyperglycemia: Secondary | ICD-10-CM

## 2023-09-03 DIAGNOSIS — E1159 Type 2 diabetes mellitus with other circulatory complications: Secondary | ICD-10-CM | POA: Diagnosis not present

## 2023-09-03 DIAGNOSIS — I152 Hypertension secondary to endocrine disorders: Secondary | ICD-10-CM | POA: Diagnosis not present

## 2023-09-03 DIAGNOSIS — F5101 Primary insomnia: Secondary | ICD-10-CM | POA: Diagnosis not present

## 2023-09-03 DIAGNOSIS — Z1231 Encounter for screening mammogram for malignant neoplasm of breast: Secondary | ICD-10-CM | POA: Diagnosis not present

## 2023-09-03 LAB — BAYER DCA HB A1C WAIVED: HB A1C (BAYER DCA - WAIVED): 7.4 % — ABNORMAL HIGH (ref 4.8–5.6)

## 2023-09-03 MED ORDER — FLUTICASONE PROPIONATE 50 MCG/ACT NA SUSP: 2.0000 | Freq: Every day | NASAL | 6 refills | Status: AC

## 2023-09-03 MED ORDER — OMEPRAZOLE 20 MG PO CPDR: 40.0000 mg | DELAYED_RELEASE_CAPSULE | Freq: Every day | ORAL | 4 refills | Status: AC

## 2023-09-03 MED ORDER — TIZANIDINE HCL 4 MG PO TABS: 4.0000 mg | ORAL_TABLET | Freq: Four times a day (QID) | ORAL | 12 refills | Status: AC | PRN

## 2023-09-03 NOTE — Assessment & Plan Note (Signed)
 Chronic, stable.  BP at goal in office today.  Continue current medication regimen and adjust as needed -- Lotensin  offering kidney protection.  Recommend checking BP at home three mornings a week and documenting for provider review.  LABS: CMP.  Urine ALB 20 March 2023.  Return in 3 months.

## 2023-09-03 NOTE — Assessment & Plan Note (Signed)
 Chronic, ongoing.  Continue current medication regimen and adjust as needed. Lipid panel today.

## 2023-09-03 NOTE — Assessment & Plan Note (Addendum)
 Chronic, ongoing.  A1c 7.4% today, trend down from 8.1%. Urine ALB 20 March 2023 - on ACE. Continue Lotensin  for kidney protection. Will not add or adjust medications at this time as she is bringing it down with diet focus. - Recommend she take Metformin  1000 MG BID as ordered and Rybelsus  7 MG daily -- would like to avoid insulin. Did not tolerate SGLT2, Glipizide , or GLP1 injections in past. Continue diet changes at home. Recommend to monitor BS daily at home and document daily with fasting BS goal of <130.  Monitor diet closely with focus on diabetic diet and regular exercise.  - Continue Lyrica  for neuropathy which she is finding benefit from - Eye and Foot exam up to date - On ACE and statin therapy - Vaccinations up to date

## 2023-09-03 NOTE — Assessment & Plan Note (Signed)
Refer to poorly controlled diabetes plan of care. 

## 2023-09-03 NOTE — Progress Notes (Addendum)
 BP 109/71   Pulse 93   Temp 98.1 F (36.7 C) (Oral)   Ht 5' 5 (1.651 m)   Wt 157 lb 9.6 oz (71.5 kg)   LMP  (LMP Unknown) Comment: age 73  SpO2 95%   BMI 26.23 kg/m    Subjective:    Patient ID: Cheryl Mendez, female    DOB: 12/18/1950, 73 y.o.   MRN: [id number]  HPI: Cheryl Mendez is a 73 y.o. female  Chief Complaint  Patient presents with   Diabetes    Eye exam requested from Dr. Carolee   Hyperlipidemia   Hypertension   DIABETES April A1c was 8.1%, a trend up for her.  Takes Rybelsus  7 MG (higher doses made her nauseous and amylase/lipase increased) and Metformin  1000 MG BID (increased in April to BID dosing).  Continues B12 for low levels and Lyrica  for neuropathy discomfort which offers benefit. Recently worked for a couple weeks and was doing a lot of lifting. Saw podiatry last 06/25/23, he advised her to take Lyrica  as instructed and not as needed.   History: Trulicity  with GI issues and Invokana  stopped due to foot concerns + Jardiance  made her sick.  Glipizide  made her feel unwell and sugars too low.  Hypoglycemic episodes:no Polydipsia/polyuria: no Visual disturbance: no Chest pain: no Paresthesias: no Glucose Monitoring: yes  Accucheck frequency: a few days a week  Fasting glucose: 150 something on Monday  Post prandial:  Evening:  Before meals: Taking Insulin?: no  Long acting insulin:  Short acting insulin: Blood Pressure Monitoring: not checking Retinal Examination: Up to Date - Dr. Carolee New Exam: Up to Date Diabetic Education: Completed Pneumovax: Up to Date Influenza: Up to Date Aspirin: yes   HYPERTENSION / HYPERLIPIDEMIA Currently taking Lotensin  20-25 and Atorvastatin  10 MG. Continues to take Trazodone  to help with sleep nightly, but uses her muscle relaxer for often which offers benefit due to underlying arthritis pain to both knees and hips. Had injection to knee on 07/06/23. Satisfied with current treatment? yes Duration of hypertension:  chronic BP monitoring frequency: not checking BP range:  BP medication side effects: no Duration of hyperlipidemia: chronic Cholesterol medication side effects: no Cholesterol supplements: none Medication compliance: good compliance Aspirin: yes Recent stressors: no Recurrent headaches: no Visual changes: no Palpitations: no Dyspnea: no Chest pain: no Lower extremity edema: no Dizzy/lightheaded: no   Relevant past medical, surgical, family and social history reviewed and updated as indicated. Interim medical history since our last visit reviewed. Allergies and medications reviewed and updated.  Review of Systems  Constitutional:  Negative for activity change, appetite change, diaphoresis, fatigue and fever.  HENT: Negative.    Respiratory:  Negative for cough, chest tightness, shortness of breath and wheezing.   Cardiovascular:  Negative for chest pain, palpitations and leg swelling.  Gastrointestinal: Negative.   Endocrine: Negative for cold intolerance, heat intolerance, polydipsia, polyphagia and polyuria.  Neurological: Negative.   Psychiatric/Behavioral:  Negative for decreased concentration, self-injury, sleep disturbance and suicidal ideas. The patient is not nervous/anxious.     Per HPI unless specifically indicated above     Objective:    BP 109/71   Pulse 93   Temp 98.1 F (36.7 C) (Oral)   Ht 5' 5 (1.651 m)   Wt 157 lb 9.6 oz (71.5 kg)   LMP  (LMP Unknown) Comment: age 73  SpO2 95%   BMI 26.23 kg/m   Wt Readings from Last 3 Encounters:  09/03/23 157 lb 9.6 oz (  71.5 kg)  06/16/23 155 lb 9.6 oz (70.6 kg)  06/03/23 160 lb 3.2 oz (72.7 kg)    Physical Exam Vitals and nursing note reviewed.  Constitutional:      General: She is awake. She is not in acute distress.    Appearance: She is well-developed and well-groomed. She is not ill-appearing or toxic-appearing.  HENT:     Head: Normocephalic.     Right Ear: Hearing and external ear normal.     Left  Ear: Hearing and external ear normal.  Eyes:     General: Lids are normal.        Right eye: No discharge.        Left eye: No discharge.     Extraocular Movements: Extraocular movements intact.     Conjunctiva/sclera: Conjunctivae normal.     Pupils: Pupils are equal, round, and reactive to light.     Visual Fields: Right eye visual fields normal and left eye visual fields normal.  Neck:     Thyroid : No thyromegaly.     Vascular: No carotid bruit.  Cardiovascular:     Rate and Rhythm: Normal rate and regular rhythm.     Heart sounds: Normal heart sounds. No murmur heard.    No gallop.  Pulmonary:     Effort: Pulmonary effort is normal. No accessory muscle usage or respiratory distress.     Breath sounds: Normal breath sounds.  Abdominal:     General: Bowel sounds are normal. There is no distension.     Palpations: Abdomen is soft.     Tenderness: There is no abdominal tenderness.  Musculoskeletal:     Cervical back: Normal range of motion and neck supple.     Right lower leg: No edema.     Left lower leg: No edema.  Lymphadenopathy:     Cervical: No cervical adenopathy.  Skin:    General: Skin is warm and dry.  Neurological:     Mental Status: She is alert and oriented to person, place, and time.     Deep Tendon Reflexes: Reflexes are normal and symmetric.     Reflex Scores:      Brachioradialis reflexes are 2+ on the right side and 2+ on the left side.      Patellar reflexes are 2+ on the right side and 2+ on the left side. Psychiatric:        Attention and Perception: Attention normal.        Mood and Affect: Mood normal.        Speech: Speech normal.        Behavior: Behavior normal. Behavior is cooperative.        Thought Content: Thought content normal.    Diabetic Foot Exam - Simple   Simple Foot Form Visual Inspection See comments: Yes Sensation Testing Intact to touch and monofilament testing bilaterally: Yes Pulse Check Posterior Tibialis and Dorsalis  pulse intact bilaterally: Yes Comments Multiple spider veins, dry skin and thickened toenails to both feet.     Results for orders placed or performed in visit on 06/16/23  Novel Coronavirus, NAA ([facility])   Collection Time: 06/16/23  2:45 PM   Specimen: Nasopharyngeal(NP) swabs in vial transport medium  Result Value Ref Range   SARS-CoV-2, NAA Not Detected Not Detected      Assessment & Plan:   Problem List Items Addressed This Visit       Cardiovascular and Mediastinum   Hypertension associated with diabetes (HCC)   Chronic,  stable.  BP at goal in office today.  Continue current medication regimen and adjust as needed -- Lotensin  offering kidney protection.  Recommend checking BP at home three mornings a week and documenting for provider review.  LABS: CMP.  Urine ALB 20 March 2023.  Return in 3 months.      Relevant Orders   Bayer DCA Hb A1c Waived     Endocrine   Poorly controlled type 2 diabetes mellitus (HCC) - Primary   Chronic, ongoing.  A1c 7.4% today, trend down from 8.1%. Urine ALB 20 March 2023 - on ACE. Continue Lotensin  for kidney protection. Will not add or adjust medications at this time as she is bringing it down with diet focus. - Recommend she take Metformin  1000 MG BID as ordered and Rybelsus  7 MG daily -- would like to avoid insulin. Did not tolerate SGLT2, Glipizide , or GLP1 injections in past. Continue diet changes at home. Recommend to monitor BS daily at home and document daily with fasting BS goal of <130.  Monitor diet closely with focus on diabetic diet and regular exercise.  - Continue Lyrica  for neuropathy which she is finding benefit from - Eye and Foot exam up to date - On ACE and statin therapy - Vaccinations up to date      Relevant Orders   Bayer DCA Hb A1c Waived   Hyperlipidemia associated with type 2 diabetes mellitus (HCC)   Chronic, ongoing.  Continue current medication regimen and adjust as needed.  Lipid panel today.       Relevant Orders   Bayer DCA Hb A1c Waived   Comprehensive metabolic panel with GFR   Lipid Panel w/o Chol/HDL Ratio   Diabetes mellitus treated with oral medication (HCC)   Refer to poorly controlled diabetes plan of care.      Relevant Orders   Bayer DCA Hb A1c Waived     Other   Insomnia   Chronic, ongoing.  No benefit from Melatonin or CBD.  Discussed with her and will continue Trazodone  100 MG nightly as needed and Tizanidine  as needed.  Educated her on this medication and use + side effects.  Recommend heavy focus on sleep hygiene techniques, discussed with her.      Other Visit Diagnoses       Encounter for screening mammogram for malignant neoplasm of breast       Mammogram ordered and instructed how to schedule   Relevant Orders   MM 3D SCREENING MAMMOGRAM BILATERAL BREAST        Follow up plan: Return in about 3 months (around 12/04/2023) for T2DM, HTN/HLD, GERD, B12.

## 2023-09-03 NOTE — Assessment & Plan Note (Signed)
 Chronic, ongoing.  No benefit from Melatonin or CBD.  Discussed with her and will continue Trazodone  100 MG nightly as needed and Tizanidine  as needed.  Educated her on this medication and use + side effects.  Recommend heavy focus on sleep hygiene techniques, discussed with her.

## 2023-09-04 ENCOUNTER — Telehealth: Payer: Self-pay

## 2023-09-04 ENCOUNTER — Ambulatory Visit: Payer: Self-pay | Admitting: Nurse Practitioner

## 2023-09-04 LAB — LIPID PANEL W/O CHOL/HDL RATIO
Cholesterol, Total: 145 mg/dL (ref 100–199)
HDL: 38 mg/dL — ABNORMAL LOW
LDL Chol Calc (NIH): 76 mg/dL (ref 0–99)
Triglycerides: 180 mg/dL — ABNORMAL HIGH (ref 0–149)
VLDL Cholesterol Cal: 31 mg/dL (ref 5–40)

## 2023-09-04 LAB — COMPREHENSIVE METABOLIC PANEL WITH GFR
ALT: 15 IU/L (ref 0–32)
AST: 20 IU/L (ref 0–40)
Albumin: 4.3 g/dL (ref 3.8–4.8)
Alkaline Phosphatase: 47 IU/L (ref 44–121)
BUN/Creatinine Ratio: 27 (ref 12–28)
BUN: 17 mg/dL (ref 8–27)
Bilirubin Total: 0.5 mg/dL (ref 0.0–1.2)
CO2: 24 mmol/L (ref 20–29)
Calcium: 9.4 mg/dL (ref 8.7–10.3)
Chloride: 94 mmol/L — ABNORMAL LOW (ref 96–106)
Creatinine, Ser: 0.64 mg/dL (ref 0.57–1.00)
Globulin, Total: 2.5 g/dL (ref 1.5–4.5)
Glucose: 110 mg/dL — ABNORMAL HIGH (ref 70–99)
Potassium: 3.5 mmol/L (ref 3.5–5.2)
Sodium: 136 mmol/L (ref 134–144)
Total Protein: 6.8 g/dL (ref 6.0–8.5)
eGFR: 93 mL/min/1.73 (ref >59)

## 2023-09-04 NOTE — Telephone Encounter (Signed)
 Copied from CRM [id number]. Topic: Clinical - Prescription Issue >> Sep 04, 2023  9:44 AM Powell HERO wrote: Reason for CRM: tiZANidine  (ZANAFLEX ) 4 MG tablet, benazepril -hydrochlorthiazide (LOTENSIN  HCT) 20-25 MG tablet. Patient is requesting a call to know when these are sent. States she has called 3x to have these filled and it has still not been sent to the pharmacy, she doesn't know what the issue is.

## 2023-09-04 NOTE — Progress Notes (Signed)
 Please let Cheryl Mendez know her labs have returned and remain overall stable and at baseline for her.  No medication changes needed at this time. Any questions? Keep being amazing!!  Thank you for allowing me to participate in your care.  I appreciate you. Kindest regards, Zeta Bucy

## 2023-09-04 NOTE — Telephone Encounter (Signed)
 Called [facility]. Tizanidine  is there and ready to be picked up. Benazepril  hydrochlorothiazide  was not received on 08/26/23 so I called it in verbally for the patient.   Called and notified patient of the above.

## 2023-09-13 ENCOUNTER — Other Ambulatory Visit: Payer: Self-pay | Admitting: Nurse Practitioner

## 2023-09-15 NOTE — Telephone Encounter (Signed)
 Requested Prescriptions  Refused Prescriptions Disp Refills   omeprazole  (PRILOSEC) 20 MG capsule [Pharmacy Med Name: OMEPRAZOLE  20MG  CAPSULES] 180 capsule 4    Sig: TAKE 2 CAPSULES(40 MG) BY MOUTH DAILY     Gastroenterology: Proton Pump Inhibitors Passed - 09/15/2023  2:15 PM      Passed - Valid encounter within last 12 months    Recent Outpatient Visits           1 week ago Poorly controlled type 2 diabetes mellitus (HCC)   [place] [facility] [place], Harrington T, NP   3 months ago Tachycardia   [place] [facility] [place], Cheverly T, NP   3 months ago Poorly controlled type 2 diabetes mellitus (HCC)   [place] [facility] [place], Depew T, NP   4 months ago Acute non-recurrent frontal sinusitis   [place] [facility] [place], Melanie DASEN, NP

## 2023-09-24 DIAGNOSIS — M7541 Impingement syndrome of right shoulder: Secondary | ICD-10-CM | POA: Diagnosis not present

## 2023-10-06 ENCOUNTER — Ambulatory Visit
Admission: EM | Admit: 2023-10-06 | Discharge: 2023-10-06 | Disposition: A | Discharge status: Home / Self Care | Attending: Family Medicine | Admitting: Family Medicine

## 2023-10-06 ENCOUNTER — Ambulatory Visit: Payer: Self-pay | Admitting: Nurse Practitioner

## 2023-10-06 DIAGNOSIS — J069 Acute upper respiratory infection, unspecified: Secondary | ICD-10-CM | POA: Diagnosis not present

## 2023-10-06 LAB — GROUP A STREP BY PCR: Group A Strep by PCR: NOT DETECTED

## 2023-10-06 LAB — RESP PANEL BY RT-PCR (FLU A&B, COVID) ARPGX2
Influenza A by PCR: NEGATIVE
Influenza B by PCR: NEGATIVE
SARS Coronavirus 2 by RT PCR: NEGATIVE

## 2023-10-06 MED ORDER — PROMETHAZINE-DM 6.25-15 MG/5ML PO SYRP: 5.0000 mL | ORAL_SOLUTION | Freq: Four times a day (QID) | ORAL | 0 refills | Status: DC | PRN

## 2023-10-06 NOTE — Telephone Encounter (Signed)
 FYI Only or Action Required?: FYI only for provider.  Patient was last seen in primary care on 09/03/2023 by Valerio Melanie DASEN, NP.  Called Nurse Triage reporting Sore Throat.  Symptoms began several days ago.  Interventions attempted: OTC medications: Cough drops, Tylenol.  Symptoms are: gradually worsening.  Triage Disposition: Go to ED Now (Notify PCP)  Patient/caregiver understands and will follow disposition?:                               Copied from CRM [id number]. Topic: Clinical - Red Word Triage >> Oct 06, 2023  8:50 AM Selinda RAMAN wrote: Red Word that prompted transfer to Nurse Triage: The patient called in stating she has a sore throat, cough, congestion and fever. She says it feels like her throat is closing up and she cannot get a full breath. I will transfer her to E2C2 NT Reason for Disposition  SEVERE PAIN WITH DYSPHAGIA (e.g., can't swallow any liquids, or drooling)  Answer Assessment - Initial Assessment Questions This RN recommends pt goes to ED and has another adult drive but pt refuses stating she has not had a good experience at the ED before. Pt is going to [facility] right now to be seen.   ONSET: When did the throat start hurting? (Hours or days ago)      Saturday  SEVERITY: How bad is the sore throat? (Scale 1-10; mild, moderate or severe)   10/10  STREP EXPOSURE: Has there been any exposure to strep within the past week? If Yes, ask: What type of contact occurred?        No but pt states she has been at the hospital visiting a family member  VIRAL SYMPTOMS: Are there any symptoms of a cold, such as a runny nose, cough, hoarse voice or red eyes?      Productive cough, runny nose, hoarse voice  FEVER: Do you have a fever? If Yes, ask: What is your temperature, how was it measured, and when did it start?     Pt is not sure; denies body aches, chills, sweats   OTHER SYMPTOMS: Do you have any  other symptoms? (e.g., difficulty breathing, headache, rash)     Difficulty breathing started this morning like its closing in, denies headache  Protocols used: Sore Throat-A-AH

## 2023-10-06 NOTE — ED Triage Notes (Signed)
 Pt c/o sore throat,cough & congestion x2 days. Has tried tylenol w/o relief.

## 2023-10-06 NOTE — Discharge Instructions (Addendum)
 Cheryl Mendez's strep,  influenza and COVID are all negative. You have a viral respiratory infection that will gradually improve over the next 7-10 days. Cough may last up to 3 weeks.    You can take Tylenol and/or Ibuprofen as needed for fever reduction and pain relief.    For cough: honey 1/2 to 1 teaspoon (you can dilute the honey in water or another fluid).  You can also use guaifenesin  and dextromethorphan for cough. You can use a humidifier for chest congestion and cough.  If you don't have a humidifier, you can sit in the bathroom with the hot shower running.      For sore throat: try warm salt water gargles, Mucinex  sore throat cough drops or cepacol lozenges, throat spray, warm tea or water with lemon/honey, popsicles or ice, or OTC cold relief medicine for throat discomfort. You can also purchase chloraseptic spray at the pharmacy or dollar store.   For congestion: take a daily anti-histamine like Zyrtec, Claritin , and a oral decongestant, such as pseudoephedrine.  You can also use Flonase  1-2 sprays in each nostril daily. Afrin is also a good option, if you do not have high blood pressure.    It is important to stay hydrated: drink plenty of fluids (water, gatorade/powerade/pedialyte, juices, or teas) to keep your throat moisturized and help further relieve irritation/discomfort.    Return or go to the Emergency Department if symptoms worsen or do not improve in the next few days

## 2023-10-06 NOTE — ED Provider Notes (Signed)
 [facility]    CSN: [id number] Arrival date & time: 10/06/23  9070      History   Chief Complaint Chief Complaint  Patient presents with   Sore Throat   Nasal Congestion    HPI Cheryl Mendez is a 73 y.o. female.   HPI  History obtained from the patient. [name] presents for 2 days of sore throat, nasal congestion, cough and fatigue. Took Tylenol and Mucinex  cough drops without relief of symptoms. Denies fever, vomiting, nausea, diarrhea, ear pain. Endorses slight abdominal pain.  Headaches resolved.   She has been going to the hospital to see her sister and thinks she picked something up at the hospital.      Past Medical History:  Diagnosis Date   Allergy    Arthritis    Diabetes mellitus without complication (HCC)    GERD (gastroesophageal reflux disease)    Hyperlipidemia    Hypertension    Lumbago    Migraine     Patient Active Problem List   Diagnosis Date Noted   Diabetes mellitus treated with oral medication (HCC) 05/31/2023   Insomnia 05/20/2022   Inguinal hernia 03/11/2022   Varicose veins of left lower extremity with pain 02/07/2022   Primary osteoarthritis of right shoulder 11/01/2021   Osteoarthritis of left knee 12/13/2020   B12 deficiency 03/28/2020   GERD (gastroesophageal reflux disease) 07/28/2018   Allergic rhinitis 11/01/2014   Hyperlipidemia associated with type 2 diabetes mellitus (HCC) 11/01/2014   Poorly controlled type 2 diabetes mellitus (HCC) 11/01/2014   Arthritis 11/01/2014   Hypertension associated with diabetes (HCC) 11/01/2014    Past Surgical History:  Procedure Laterality Date   COLONOSCOPY WITH PROPOFOL  N/A 06/25/2016   Procedure: COLONOSCOPY WITH PROPOFOL ;  Surgeon: Therisa Bi, MD;  Location: [facility] ENDOSCOPY;  Service: Endoscopy;  Laterality: N/A;   ESOPHAGOGASTRODUODENOSCOPY (EGD) WITH PROPOFOL  N/A 06/25/2016   Procedure: ESOPHAGOGASTRODUODENOSCOPY (EGD) WITH PROPOFOL ;  Surgeon: Therisa Bi, MD;  Location: [facility]  ENDOSCOPY;  Service: Endoscopy;  Laterality: N/A;   NO PAST SURGERIES      OB History   No obstetric history on file.      Home Medications    Prior to Admission medications   Medication Sig Start Date End Date Taking? Authorizing Provider  promethazine -dextromethorphan (PROMETHAZINE -DM) 6.25-15 MG/5ML syrup Take 5 mLs by mouth 4 (four) times daily as needed. 10/06/23  Yes Rishit Burkhalter, DO  albuterol  (VENTOLIN  HFA) 108 (90 Base) MCG/ACT inhaler Inhale 2 puffs into the lungs every 6 (six) hours as needed for wheezing or shortness of breath. 09/02/23   Cannady, Jolene T, NP  aspirin 81 MG chewable tablet Chew 81 mg by mouth daily.    [provider]  atorvastatin  (LIPITOR) 40 MG tablet Take 1 tablet (40 mg total) by mouth daily. 11/22/22   Cannady, Jolene T, NP  benazepril -hydrochlorthiazide (LOTENSIN  HCT) 20-25 MG tablet TAKE 1 TABLET BY MOUTH DAILY 08/26/23   Cannady, Jolene T, NP  Blood Glucose Monitoring Suppl (ONE TOUCH ULTRA 2) w/Device KIT Use to check blood sugar 3 times a day and document results, bring to appointments. Goal is <130 fasting blood sugar and <180 two hours after meals 11/01/21   Cannady, Jolene T, NP  fluticasone  (FLONASE ) 50 MCG/ACT nasal spray Place 2 sprays into both nostrils daily. 09/03/23   Cannady, Jolene T, NP  latanoprost (XALATAN) 0.005 % ophthalmic solution Place 1 drop into both eyes at bedtime. 01/28/23   [provider]  linaclotide LARUE) 72 MCG capsule Take  72 mcg by mouth daily as needed. 4 days per week. Got from a friend    [provider]  loratadine  (CLARITIN ) 10 MG tablet TAKE 1 TABLET(10 MG) BY MOUTH DAILY 08/07/23   Cannady, Jolene T, NP  meclizine  (ANTIVERT ) 12.5 MG tablet Take 1 tablet (12.5 mg total) by mouth 3 (three) times daily as needed for dizziness. 02/25/22   Cannady, Jolene T, NP  metFORMIN  (GLUCOPHAGE ) 1000 MG tablet TAKE 1 TABLET(1000 MG) BY MOUTH TWICE DAILY WITH A MEAL 08/21/22   Cannady, Jolene T, NP   omeprazole  (PRILOSEC) 20 MG capsule Take 2 capsules (40 mg total) by mouth daily. 09/03/23   Cannady, Jolene T, NP  ondansetron  (ZOFRAN -ODT) 4 MG disintegrating tablet Take 1 tablet (4 mg total) by mouth every 6 (six) hours as needed for nausea or vomiting. 03/19/22   Valerio Melanie DASEN, NP  ONETOUCH DELICA LANCETS FINE MISC USE UP TO FOUR TIMES DAILY AS DIRECTED 09/02/17   Daphane Rosella, NP  [facility] ULTRA test strip TEST FOUR TIMES DAILY 03/29/19   Cannady, Jolene T, NP  pregabalin  (LYRICA ) 25 MG capsule Take 1 capsule (25 mg total) by mouth 2 (two) times daily. 02/26/23   Cannady, Melanie T, NP  RYBELSUS  7 MG TABS TAKE 1 TABLET BY MOUTH DAILY 04/16/23   Cannady, Jolene T, NP  sennosides-docusate sodium  (SENOKOT-S) 8.6-50 MG tablet Take 1 tablet by mouth 2 (two) times daily. 05/02/23   Cannady, Jolene T, NP  tiZANidine  (ZANAFLEX ) 4 MG tablet Take 1 tablet (4 mg total) by mouth every 6 (six) hours as needed for muscle spasms. 09/03/23   Cannady, Jolene T, NP  traZODone  (DESYREL ) 100 MG tablet Take 1 tablet (100 mg total) by mouth at bedtime. 08/21/22   Cannady, Jolene T, NP    Family History Family History  Problem Relation Age of Onset   Diabetes Mother    Heart disease Father    Hypertension Father    Eczema Sister    COPD Sister     Social History Social History   Tobacco Use   Smoking status: Never   Smokeless tobacco: Never  Vaping Use   Vaping status: Never Used  Substance Use Topics   Alcohol use: No    Alcohol/week: 0.0 standard drinks of alcohol   Drug use: No     Allergies   Penicillin g benzathine   Review of Systems Review of Systems: negative unless otherwise stated in HPI.      Physical Exam Triage Vital Signs ED Triage Vitals  Encounter Vitals Group     BP 10/06/23 1021 118/76     Girls Systolic BP Percentile --      Girls Diastolic BP Percentile --      Boys Systolic BP Percentile --      Boys Diastolic BP Percentile --      Pulse Rate 10/06/23 1021 85      Resp 10/06/23 1021 16     Temp 10/06/23 1021 98 F (36.7 C)     Temp Source 10/06/23 1021 Oral     SpO2 10/06/23 1021 98 %     Weight 10/06/23 1021 163 lb 9.6 oz (74.2 kg)     Height --      Head Circumference --      Peak Flow --      Pain Score 10/06/23 1026 9     Pain Loc --      Pain Education --      Exclude from [employer]  Chart --    No data found.  Updated Vital Signs BP 118/76 (BP Location: Left Arm)   Pulse 85   Temp 98 F (36.7 C) (Oral)   Resp 16   Wt 74.2 kg   LMP  (LMP Unknown) Comment: age 21  SpO2 98%   BMI 27.22 kg/m   Visual Acuity Right Eye Distance:   Left Eye Distance:   Bilateral Distance:    Right Eye Near:   Left Eye Near:    Bilateral Near:     Physical Exam GEN:     alert, non-toxic appearing female in no distress    HENT:  mucus membranes moist, oropharyngeal without lesions or erythema, no tonsillar hypertrophy or exudates,  clear nasal discharge EYES:   no scleral injection or discharge NECK:  normal ROM, +lymphadenopathy RESP:  no increased work of breathing, clear to auscultation bilaterally CVS:   regular rate and rhythm Skin:   warm and dry    UC Treatments / Results  Labs (all labs ordered are listed, but only abnormal results are displayed) Labs Reviewed  GROUP A STREP BY PCR  RESP PANEL BY RT-PCR (FLU A&B, COVID) [id number]    EKG   Radiology No results found.  Procedures Procedures (including critical care time)  Medications Ordered in UC Medications - No data to display  Initial Impression / Assessment and Plan / UC Course  I have reviewed the triage vital signs and the nursing notes.  Pertinent labs & imaging results that were available during my care of the patient were reviewed by me and considered in my medical decision making (see chart for details).       Pt is a 73 y.o. female who presents for 2 days of respiratory symptoms. Jevaeh is afebrile here without recent antipyretics. Satting well on room air.  Overall pt is non-toxic appearing, well hydrated, without respiratory distress. Pulmonary exam is unremarkable.  COVID and influenza panel obtained and was negative. Strep PCR is negative.   Suspect viral respiratory illness. Discussed symptomatic treatment.  Explained lack of efficacy of antibiotics in viral disease. Promethazine  DM for cough. Continue home nasal spray and OTC medications. Typical duration of symptoms discussed.   Return and ED precautions given and voiced understanding. Discussed MDM, treatment plan and plan for follow-up with patient who agrees with plan.     Final Clinical Impressions(s) / UC Diagnoses   Final diagnoses:  Viral URI with cough     Discharge Instructions      Tatym Schermer Sendejo's strep,  influenza and COVID are all negative. You have a viral respiratory infection that will gradually improve over the next 7-10 days. Cough may last up to 3 weeks.    You can take Tylenol and/or Ibuprofen as needed for fever reduction and pain relief.    For cough: honey 1/2 to 1 teaspoon (you can dilute the honey in water or another fluid).  You can also use guaifenesin  and dextromethorphan for cough. You can use a humidifier for chest congestion and cough.  If you don't have a humidifier, you can sit in the bathroom with the hot shower running.      For sore throat: try warm salt water gargles, Mucinex  sore throat cough drops or cepacol lozenges, throat spray, warm tea or water with lemon/honey, popsicles or ice, or OTC cold relief medicine for throat discomfort. You can also purchase chloraseptic spray at the pharmacy or dollar store.   For congestion: take a daily anti-histamine like  Zyrtec, Claritin , and a oral decongestant, such as pseudoephedrine.  You can also use Flonase  1-2 sprays in each nostril daily. Afrin is also a good option, if you do not have high blood pressure.    It is important to stay hydrated: drink plenty of fluids (water, gatorade/powerade/pedialyte,  juices, or teas) to keep your throat moisturized and help further relieve irritation/discomfort.    Return or go to the Emergency Department if symptoms worsen or do not improve in the next few days      ED Prescriptions     Medication Sig Dispense Auth. Provider   promethazine -dextromethorphan (PROMETHAZINE -DM) 6.25-15 MG/5ML syrup Take 5 mLs by mouth 4 (four) times daily as needed. 118 mL Jonelle Bann, DO      PDMP not reviewed this encounter.   Betzabeth Derringer, DO 10/06/23 1133

## 2023-10-07 ENCOUNTER — Other Ambulatory Visit: Payer: Self-pay | Admitting: Nurse Practitioner

## 2023-10-07 NOTE — Telephone Encounter (Signed)
 Copied from CRM #[id number]. Topic: General - Other >> Oct 07, 2023  1:10 PM Zebedee SAUNDERS wrote: Reason for CRM: Pt went to urgent care regarding sore throat, congestion in sinus and chest. But is having trouble breathing. Urgent care did not give her anything. Pt would like something sent to pharmacy [id number] - [address] [place] [address] Phone: [phone] Fax: [phone]

## 2023-10-09 ENCOUNTER — Other Ambulatory Visit: Payer: Self-pay | Admitting: Nurse Practitioner

## 2023-10-10 ENCOUNTER — Ambulatory Visit: Payer: Self-pay

## 2023-10-10 ENCOUNTER — Telehealth

## 2023-10-10 NOTE — Telephone Encounter (Signed)
 Unable to refill per protocol, Rx expired.discontinued 08/22/22, dose change.  Requested Prescriptions  Pending Prescriptions Disp Refills   atorvastatin  (LIPITOR) 10 MG tablet [Pharmacy Med Name: ATORVASTATIN  10MG  TABLETS] 90 tablet 4    Sig: TAKE 1 TABLET(10 MG) BY MOUTH DAILY     Cardiovascular:  Antilipid - Statins Failed - 10/10/2023  9:27 AM      Failed - Lipid Panel in normal range within the last 12 months    Cholesterol, Total  Date Value Ref Range Status  09/03/2023 145 100 - 199 mg/dL Final   Cholesterol Piccolo, Waived  Date Value Ref Range Status  07/28/2018 159 <200 mg/dL Final    Comment:                            Desirable                <200                         Borderline High      200- 239                         High                     >239    LDL Chol Calc (NIH)  Date Value Ref Range Status  09/03/2023 76 0 - 99 mg/dL Final   HDL  Date Value Ref Range Status  09/03/2023 38 (L) >39 mg/dL Final   Triglycerides  Date Value Ref Range Status  09/03/2023 180 (H) 0 - 149 mg/dL Final   Triglycerides Piccolo,Waived  Date Value Ref Range Status  07/28/2018 308 (H) <150 mg/dL Final    Comment:                            Normal                   <150                         Borderline High     150 - 199                         High                200 - 499                         Very High                >499          Passed - Patient is not pregnant      Passed - Valid encounter within last 12 months    Recent Outpatient Visits           1 month ago Poorly controlled type 2 diabetes mellitus (HCC)   [place] [facility] [place], [place] T, NP   3 months ago Tachycardia   [place] [facility] [place], [place] T, NP   4 months ago Poorly controlled type 2 diabetes mellitus (HCC)   [place] [facility] [place], [place] T, NP   5 months ago Acute non-recurrent frontal sinusitis    [facility] [place], Jolene T, NP

## 2023-10-10 NOTE — Telephone Encounter (Signed)
 FYI Only or Action Required?: Action required by provider: request for appointment. Pt states that Cheryl Mendez told her she would work her in when needed. Wants ABX.  Patient was last seen in primary care on 09/03/2023 by Valerio Melanie DASEN, NP.  Called Nurse Triage reporting Cough - wet, near constant  Symptoms began a week ago.  Interventions attempted: Other: went to UC.  Symptoms are: gradually worsening.  Triage Disposition: See HCP Within 4 Hours (Or PCP Triage)  Patient/caregiver understands and will follow disposition?: No - pt wants to be worked in for today. Will take a virtual visit with Cheryl Mendez. Pt refuses UC and MyChart vv.                   Copied from CRM [id number]. Topic: Clinical - Red Word Triage >> Oct 10, 2023 11:07 AM Marissa P wrote: Red Word that prompted transfer to Nurse Triage: Patient is having a sore throat, had some cough meds and its not getting better. Went to urgent care they said she has a bad sinus infection, using inhalers, cannot breathe good etc. Painful when swallowing, talk etc. Would like a antibiotic and to be seen. Reason for Disposition  [1] MILD difficulty breathing (e.g., minimal/no SOB at rest, SOB with walking, pulse < 100) AND [2] still present when not coughing  Answer Assessment - Initial Assessment Questions 1. ONSET: When did the cough begin?      Prior to 8/18 2. SEVERITY: How bad is the cough today?      Near constant 3. SPUTUM: Describe the color of your sputum (e.g., none, dry cough; clear, white, yellow, green)     Not producing much , but sounds very wet  5. DIFFICULTY BREATHING: Are you having difficulty breathing? If Yes, ask: How bad is it? (e.g., mild, moderate, severe)      Mild - if any 6. FEVER: Do you have a fever? If Yes, ask: What is your temperature, how was it measured, and when did it start?     no  10. OTHER SYMPTOMS: Do you have any other symptoms? (e.g., runny nose, wheezing, chest  pain)       Sore throat  Protocols used: Cough - Acute Non-Productive-A-AH

## 2023-10-10 NOTE — Telephone Encounter (Signed)
 Called and spoke to patient. Advised her of Jolene's message. Patient states she was just seen at [facility] and doesn't want to go back. Scheduled patient an appointment with us  for Tuesday. Advised patient to please go back to UC over the weekend if her symptoms worsen. Patient verbalized understanding.

## 2023-10-11 NOTE — Patient Instructions (Incomplete)
 Try Diabetic Tussin and Delsym  Cough, Adult A cough helps to clear your throat and lungs. It may be a sign of an illness or another condition. A short-term (acute) cough may last 2-3 weeks. A long-term (chronic) cough may last 8 or more weeks. Many things can cause a cough. They include: Illnesses such as: An infection in your throat or lungs. Asthma or other heart or lung problems. Gastroesophageal reflux. This is when acid comes back up from your stomach. Breathing in things that bother (irritate) your lungs. Allergies. Postnasal drip. This is when mucus runs down the back of your throat. Smoking. Some medicines. Follow these instructions at home: Medicines Take over-the-counter and prescription medicines only as told by your doctor. Talk with your doctor before you take cough medicine (cough suppressants). Eating and drinking Do not drink alcohol. Do not drink caffeine. Drink enough fluid to keep your pee (urine) pale yellow. Lifestyle Stay away from cigarette smoke. Do not smoke or use any products that contain nicotine or tobacco. If you need help quitting, ask your doctor. Stay away from things that make you cough. These may include perfume, candles, cleaning products, or campfire smoke. General instructions  Watch for any changes to your cough. Tell your doctor about them. Always cover your mouth when you cough. If the air is dry in your home, use a cool mist vaporizer or humidifier. If your cough is worse at night, try using extra pillows to raise your head up higher while you sleep. Rest as needed. Contact a doctor if: You have new symptoms. Your symptoms get worse. You cough up pus. You have a fever that does not go away. Your cough does not get better after 2-3 weeks. Cough medicine does not help, and you are not sleeping well. You have pain that gets worse or is not helped with medicine. You are losing weight and do not know why. You have night sweats. Get  help right away if: You cough up blood. You have trouble breathing. Your heart is beating very fast. These symptoms may be an emergency. Get help right away. Call 911. Do not wait to see if the symptoms will go away. Do not drive yourself to the hospital. This information is not intended to replace advice given to you by your health care provider. Make sure you discuss any questions you have with your health care provider. Document Revised: 10/05/2021 Document Reviewed: 10/05/2021 Elsevier Patient Education  2024 [employer].

## 2023-10-14 ENCOUNTER — Ambulatory Visit (INDEPENDENT_AMBULATORY_CARE_PROVIDER_SITE_OTHER): Admitting: Nurse Practitioner

## 2023-10-14 ENCOUNTER — Encounter: Payer: Self-pay | Admitting: Nurse Practitioner

## 2023-10-14 VITALS — BP 140/86 | HR 88 | Temp 98.8°F | Ht 65.0 in | Wt 166.6 lb

## 2023-10-14 DIAGNOSIS — J0110 Acute frontal sinusitis, unspecified: Secondary | ICD-10-CM

## 2023-10-14 MED ORDER — AZITHROMYCIN 250 MG PO TABS: ORAL_TABLET | ORAL | 0 refills | Status: AC

## 2023-10-14 NOTE — Assessment & Plan Note (Signed)
 Acute for 2 weeks. Start Azithromycin , has tolerated this in the past.  Avoid Prednisone  due to diabetes.  Recommend: - Increased rest - Increasing Fluids - Acetaminophen as needed for fever/pain.  - Salt water gargling, chloraseptic spray and throat lozenges - OTC Diabetic Tussin or Delysm - Mucinex .  - Humidifying the air.

## 2023-10-14 NOTE — Progress Notes (Signed)
 BP (!) 140/86 (BP Location: Left Arm, Patient Position: Sitting, Cuff Size: Normal)   Pulse 88   Temp 98.8 F (37.1 C) (Oral)   Ht 5' 5 (1.651 m)   Wt 166 lb 9.6 oz (75.6 kg)   LMP  (LMP Unknown) Comment: age 73  SpO2 98%   BMI 27.72 kg/m    Subjective:    Patient ID: Cheryl Mendez, female    DOB: 18-Jun-1950, 73 y.o.   MRN: [id number]  HPI: Cheryl Mendez is a 73 y.o. female  Chief Complaint  Patient presents with   URI    Patient states she has been having a cough, congestion, sinus pressure and achy for the last 10 days. States her cough has been a little bit productive.   UPPER RESPIRATORY TRACT INFECTION Has been feeling bad for almost 2 weeks.  Started out with sinus symptoms and now is more in chest.  Covid and strep negative at urgent care on the 18th.  Cough has been productive.  Had been in hospital visiting sister prior to it starting. Fever: no Cough: yes Shortness of breath: yes - using Albuterol  Wheezing: yes -- using Albuterol  Chest pain: no Chest tightness: yes Chest congestion: yes Nasal congestion: yes Runny nose: yes Post nasal drip: yes Sneezing: no Sore throat: yes Swollen glands: no Sinus pressure: no Headache: no Face pain: no Toothache: no Ear pain: none Ear pressure: none Eyes red/itching: no Eye drainage/crusting: no  Vomiting: no Rash: no Fatigue: yes Sick contacts: yes probably in hospital Strep contacts: no  Context: fluctuating Recurrent sinusitis: no Relief with OTC cold/cough medications: no  Treatments attempted: cold/sinus, mucinex , and cough syrup    Relevant past medical, surgical, family and social history reviewed and updated as indicated. Interim medical history since our last visit reviewed. Allergies and medications reviewed and updated.  Review of Systems  Constitutional:  Positive for fatigue. Negative for activity change, appetite change, chills and fever.  HENT:  Positive for congestion, postnasal drip,  rhinorrhea and sore throat. Negative for ear discharge, ear pain, facial swelling, sinus pressure, sinus pain, sneezing and voice change.   Respiratory:  Positive for cough, chest tightness, shortness of breath and wheezing.   Cardiovascular:  Negative for chest pain, palpitations and leg swelling.  Gastrointestinal: Negative.   Endocrine: Negative.   Neurological: Negative.   Psychiatric/Behavioral: Negative.     Per HPI unless specifically indicated above     Objective:    BP (!) 140/86 (BP Location: Left Arm, Patient Position: Sitting, Cuff Size: Normal)   Pulse 88   Temp 98.8 F (37.1 C) (Oral)   Ht 5' 5 (1.651 m)   Wt 166 lb 9.6 oz (75.6 kg)   LMP  (LMP Unknown) Comment: age 51  SpO2 98%   BMI 27.72 kg/m   Wt Readings from Last 3 Encounters:  10/14/23 166 lb 9.6 oz (75.6 kg)  10/06/23 163 lb 9.6 oz (74.2 kg)  09/03/23 157 lb 9.6 oz (71.5 kg)    Physical Exam Vitals and nursing note reviewed.  Constitutional:      General: She is awake. She is not in acute distress.    Appearance: She is well-developed and well-groomed. She is obese. She is ill-appearing. She is not toxic-appearing.  HENT:     Head: Normocephalic.     Right Ear: Hearing, ear canal and external ear normal. A middle ear effusion is present. Tympanic membrane is not injected or perforated.     Left Ear:  Hearing, ear canal and external ear normal. A middle ear effusion is present. Tympanic membrane is not injected or perforated.     Nose: Rhinorrhea present. Rhinorrhea is clear.     Right Sinus: Frontal sinus tenderness present. No maxillary sinus tenderness.     Left Sinus: Frontal sinus tenderness present. No maxillary sinus tenderness.     Mouth/Throat:     Mouth: Mucous membranes are moist.     Pharynx: Posterior oropharyngeal erythema (mild) present. No pharyngeal swelling or oropharyngeal exudate.  Eyes:     General: Lids are normal.        Right eye: No discharge.        Left eye: No discharge.      Conjunctiva/sclera: Conjunctivae normal.     Pupils: Pupils are equal, round, and reactive to light.  Neck:     Thyroid : No thyromegaly.     Vascular: No carotid bruit.  Cardiovascular:     Rate and Rhythm: Normal rate and regular rhythm.     Heart sounds: Normal heart sounds. No murmur heard.    No gallop.  Pulmonary:     Effort: Pulmonary effort is normal. No accessory muscle usage or respiratory distress.     Breath sounds: Wheezing present. No decreased breath sounds or rales.     Comments: Intermittent expiratory wheezes throughout. Abdominal:     General: Bowel sounds are normal.     Palpations: Abdomen is soft. There is no hepatomegaly or splenomegaly.  Musculoskeletal:     Cervical back: Normal range of motion and neck supple.     Right lower leg: No edema.     Left lower leg: No edema.  Lymphadenopathy:     Head:     Right side of head: Submandibular and tonsillar adenopathy present. No submental, preauricular or posterior auricular adenopathy.     Left side of head: Submandibular and tonsillar adenopathy present. No submental, preauricular or posterior auricular adenopathy.     Cervical: No cervical adenopathy.  Skin:    General: Skin is warm and dry.  Neurological:     Mental Status: She is alert and oriented to person, place, and time.  Psychiatric:        Attention and Perception: Attention normal.        Mood and Affect: Mood normal.        Speech: Speech normal.        Behavior: Behavior normal. Behavior is cooperative.        Thought Content: Thought content normal.     Results for orders placed or performed during the hospital encounter of 10/06/23  Group A Strep by PCR   Collection Time: 10/06/23 10:29 AM   Specimen: Throat; Sterile Swab  Result Value Ref Range   Group A Strep by PCR NOT DETECTED NOT DETECTED  Resp Panel by RT-PCR (Flu A&B, Covid) Throat   Collection Time: 10/06/23 10:29 AM   Specimen: Throat; Nasal Swab  Result Value Ref Range    SARS Coronavirus 2 by RT PCR NEGATIVE NEGATIVE   Influenza A by PCR NEGATIVE NEGATIVE   Influenza B by PCR NEGATIVE NEGATIVE      Assessment & Plan:   Problem List Items Addressed This Visit       Respiratory   Frontal sinusitis - Primary   Acute for 2 weeks. Start Azithromycin , has tolerated this in the past.  Avoid Prednisone  due to diabetes.  Recommend: - Increased rest - Increasing Fluids - Acetaminophen as needed for fever/pain.  -  Salt water gargling, chloraseptic spray and throat lozenges - OTC Diabetic Tussin or Delysm - Mucinex .  - Humidifying the air.       Relevant Medications   azithromycin  (ZITHROMAX ) 250 MG tablet     Follow up plan: Return for as scheduled in October for diabetes check.

## 2023-10-30 ENCOUNTER — Other Ambulatory Visit: Payer: Self-pay | Admitting: Nurse Practitioner

## 2023-10-31 NOTE — Telephone Encounter (Signed)
 Requested medications are due for refill today.  yes  Requested medications are on the active medications list.  yes  Last refill. 08/21/2022 #180 4 rf  Future visit scheduled.   yes  Notes to clinic.  Expired labs  - CBC    Requested Prescriptions  Pending Prescriptions Disp Refills   metFORMIN  (GLUCOPHAGE ) 1000 MG tablet [Pharmacy Med Name: METFORMIN  1000MG  TABLETS] 180 tablet 4    Sig: TAKE 1 TABLET(1000 MG) BY MOUTH TWICE DAILY WITH A MEAL     Endocrinology:  Diabetes - Biguanides Failed - 10/31/2023 11:39 AM      Failed - CBC within normal limits and completed in the last 12 months    WBC  Date Value Ref Range Status  10/17/2022 6.5 3.4 - 10.8 x10E3/uL Final  02/12/2022 4.4 4.0 - 10.5 K/uL Final   RBC  Date Value Ref Range Status  10/17/2022 4.77 3.77 - 5.28 x10E6/uL Final  02/12/2022 4.55 3.87 - 5.11 MIL/uL Final   Hemoglobin  Date Value Ref Range Status  10/17/2022 14.0 11.1 - 15.9 g/dL Final   Hematocrit  Date Value Ref Range Status  10/17/2022 42.2 34.0 - 46.6 % Final   MCHC  Date Value Ref Range Status  10/17/2022 33.2 31.5 - 35.7 g/dL Final  87/73/7976 66.8 30.0 - 36.0 g/dL Final   [facility]  Date Value Ref Range Status  10/17/2022 29.4 26.6 - 33.0 pg Final  02/12/2022 29.5 26.0 - 34.0 pg Final   MCV  Date Value Ref Range Status  10/17/2022 89 79 - 97 fL Final  06/09/2014 88 80 - 100 fL Final   No results found for: PLTCOUNTKUC, LABPLAT, POCPLA RDW  Date Value Ref Range Status  10/17/2022 12.9 11.7 - 15.4 % Final  06/09/2014 12.6 11.5 - 14.5 % Final         Passed - Cr in normal range and within 360 days    Creatinine  Date Value Ref Range Status  06/09/2014 0.73 mg/dL Final    Comment:    9.55-8.99 NOTE: New Reference Range  04/26/14    Creatinine, Ser  Date Value Ref Range Status  09/03/2023 0.64 0.57 - 1.00 mg/dL Final         Passed - HBA1C is between 0 and 7.9 and within 180 days    Hemoglobin A1C  Date Value Ref Range Status   10/17/2015 7.4%  Final   HB A1C (BAYER DCA - WAIVED)  Date Value Ref Range Status  09/03/2023 7.4 (H) 4.8 - 5.6 % Final    Comment:             Prediabetes: 5.7 - 6.4          Diabetes: >6.4          Glycemic control for adults with diabetes: <7.0          Passed - eGFR in normal range and within 360 days    EGFR (African American)  Date Value Ref Range Status  06/09/2014 >60  Final   GFR calc Af Amer  Date Value Ref Range Status  03/28/2020 103 >59 mL/min/1.73 Final    Comment:    **In accordance with recommendations from the NKF-ASN Task force,**   [facility] is in the process of updating its eGFR calculation to the   2021 CKD-EPI creatinine equation that estimates kidney function   without a race variable.    EGFR (Non-African Amer.)  Date Value Ref Range Status  06/09/2014 >60  Final  Comment:    eGFR values <10mL/min/1.73 m2 may be an indication of chronic kidney disease (CKD). Calculated eGFR is useful in patients with stable renal function. The eGFR calculation will not be reliable in acutely ill patients when serum creatinine is changing rapidly. It is not useful in patients on dialysis. The eGFR calculation may not be applicable to patients at the low and high extremes of body sizes, pregnant women, and vegetarians.    GFR, Estimated  Date Value Ref Range Status  02/12/2022 >60 >60 mL/min Final    Comment:    (NOTE) Calculated using the CKD-EPI Creatinine Equation (2021)    eGFR  Date Value Ref Range Status  09/03/2023 93 >59 mL/min/1.73 Final         Passed - B12 Level in normal range and within 720 days    Vitamin B-12  Date Value Ref Range Status  02/26/2023 482 232 - 1,245 pg/mL Final         Passed - Valid encounter within last 6 months    Recent Outpatient Visits           2 weeks ago Acute non-recurrent frontal sinusitis   [place] [facility] [place], [place] T, NP   1 month ago Poorly controlled type 2 diabetes  mellitus (HCC)   [place] [facility] [place], Gause T, NP   4 months ago Tachycardia   [place] [facility] [place], [place] T, NP   5 months ago Poorly controlled type 2 diabetes mellitus (HCC)   [place] [facility] [place], Melanie T, NP   6 months ago Acute non-recurrent frontal sinusitis    [facility] [place], Melanie DASEN, NP

## 2023-11-06 ENCOUNTER — Other Ambulatory Visit: Payer: Self-pay | Admitting: Nurse Practitioner

## 2023-11-06 NOTE — Telephone Encounter (Signed)
 Requested medications are due for refill today.  unsure  Requested medications are on the active medications list.  no  Last refill. unknown  Future visit scheduled.   yes  Notes to clinic.  Currently pt is not on any atorvastatin . Please review    Requested Prescriptions  Pending Prescriptions Disp Refills   atorvastatin  (LIPITOR) 20 MG tablet [Pharmacy Med Name: ATORVASTATIN  20MG  TABLETS] 90 tablet 4    Sig: TAKE 1 TABLET(20 MG) BY MOUTH DAILY     Cardiovascular:  Antilipid - Statins Failed - 11/06/2023  4:50 PM      Failed - Lipid Panel in normal range within the last 12 months    Cholesterol, Total  Date Value Ref Range Status  09/03/2023 145 100 - 199 mg/dL Final   Cholesterol Piccolo, Waived  Date Value Ref Range Status  07/28/2018 159 <200 mg/dL Final    Comment:                            Desirable                <200                         Borderline High      200- 239                         High                     >239    LDL Chol Calc (NIH)  Date Value Ref Range Status  09/03/2023 76 0 - 99 mg/dL Final   HDL  Date Value Ref Range Status  09/03/2023 38 (L) >39 mg/dL Final   Triglycerides  Date Value Ref Range Status  09/03/2023 180 (H) 0 - 149 mg/dL Final   Triglycerides Piccolo,Waived  Date Value Ref Range Status  07/28/2018 308 (H) <150 mg/dL Final    Comment:                            Normal                   <150                         Borderline High     150 - 199                         High                200 - 499                         Very High                >499          Passed - Patient is not pregnant      Passed - Valid encounter within last 12 months    Recent Outpatient Visits           3 weeks ago Acute non-recurrent frontal sinusitis   [place] [facility] [place], [place] T, NP   2 months ago Poorly controlled type 2 diabetes mellitus (HCC)   [place] [facility] [place],  Melanie DASEN, NP    4 months ago Tachycardia   [place] [facility] [place], Chassell T, NP   5 months ago Poorly controlled type 2 diabetes mellitus (HCC)   [place] [facility] [place], Melanie T, NP   6 months ago Acute non-recurrent frontal sinusitis   [place] [facility] [place], Melanie DASEN, NP

## 2023-11-07 ENCOUNTER — Other Ambulatory Visit: Payer: Self-pay | Admitting: Nurse Practitioner

## 2023-12-01 ENCOUNTER — Other Ambulatory Visit: Payer: Self-pay | Admitting: Nurse Practitioner

## 2023-12-02 ENCOUNTER — Ambulatory Visit: Payer: Self-pay

## 2023-12-02 VITALS — Ht 65.0 in | Wt 162.0 lb

## 2023-12-02 DIAGNOSIS — Z0000 Encounter for general adult medical examination without abnormal findings: Secondary | ICD-10-CM | POA: Diagnosis not present

## 2023-12-02 DIAGNOSIS — Z1211 Encounter for screening for malignant neoplasm of colon: Secondary | ICD-10-CM

## 2023-12-02 NOTE — Progress Notes (Signed)
 Subjective:   Cheryl Mendez is a 73 y.o. who presents for a Medicare Wellness preventive visit.  As a reminder, Annual Wellness Visits don't include a physical exam, and some assessments may be limited, especially if this visit is performed virtually. We may recommend an in-person follow-up visit with your provider if needed.  Visit Complete: Virtual I connected with  Cheryl Mendez on 12/02/23 by a audio enabled telemedicine application and verified that I am speaking with the correct person using two identifiers.  Patient Location: Home  Provider Location: Office/Clinic  I discussed the limitations of evaluation and management by telemedicine. The patient expressed understanding and agreed to proceed.  Vital Signs: Because this visit was a virtual/telehealth visit, some criteria may be missing or patient reported. Any vitals not documented were not able to be obtained and vitals that have been documented are patient reported.  VideoDeclined- This patient declined Librarian, academic. Therefore the visit was completed with audio only.  Persons Participating in Visit: Patient.  AWV Questionnaire: No: Patient Medicare AWV questionnaire was not completed prior to this visit.  Cardiac Risk Factors include: advanced age (>33men, >83 women);dyslipidemia;hypertension;diabetes mellitus     Objective:    Today's Vitals   12/02/23 1549  Weight: 162 lb (73.5 kg)  Height: 5' 5 (1.651 m)  PainSc: 6    Body mass index is 26.96 kg/m.     12/02/2023    4:06 PM 11/19/2022    3:23 PM 02/12/2022    7:27 PM 11/01/2021   12:37 PM 10/31/2020    5:58 PM 06/25/2016    9:26 AM 05/06/2016    8:10 AM  Advanced Directives  Does Patient Have a Medical Advance Directive? No No No No Yes;No No  No   Does patient want to make changes to medical advance directive?     No - Patient declined    Would patient like information on creating a medical advance directive? No -  Patient declined Yes (MAU/Ambulatory/Procedural Areas - Information given)  No - Patient declined No - Patient declined No - Patient declined  Yes (MAU/Ambulatory/Procedural Areas - Information given)      Data saved with a previous flowsheet row definition    Current Medications (verified) Outpatient Encounter Medications as of 12/02/2023  Medication Sig   acetaminophen (TYLENOL) 650 MG CR tablet Take 650 mg by mouth every 8 (eight) hours as needed for pain.   albuterol  (VENTOLIN  HFA) 108 (90 Base) MCG/ACT inhaler Inhale 2 puffs into the lungs every 6 (six) hours as needed for wheezing or shortness of breath.   aspirin 81 MG chewable tablet Chew 81 mg by mouth daily. (Patient taking differently: Chew 81 mg by mouth daily. Taking qod)   atorvastatin  (LIPITOR) 40 MG tablet Take 1 tablet (40 mg total) by mouth daily.   benazepril -hydrochlorthiazide (LOTENSIN  HCT) 20-25 MG tablet TAKE 1 TABLET BY MOUTH EVERY DAY   Blood Glucose Monitoring Suppl (ONE TOUCH ULTRA 2) w/Device KIT Use to check blood sugar 3 times a day and document results, bring to appointments. Goal is <130 fasting blood sugar and <180 two hours after meals   fluticasone  (FLONASE ) 50 MCG/ACT nasal spray Place 2 sprays into both nostrils daily.   latanoprost (XALATAN) 0.005 % ophthalmic solution Place 1 drop into both eyes at bedtime.   linaclotide (LINZESS) 72 MCG capsule Take 72 mcg by mouth daily as needed. 4 days per week. Got from a friend   loratadine  (CLARITIN ) 10 MG tablet  TAKE 1 TABLET(10 MG) BY MOUTH DAILY   meclizine  (ANTIVERT ) 12.5 MG tablet Take 1 tablet (12.5 mg total) by mouth 3 (three) times daily as needed for dizziness.   metFORMIN  (GLUCOPHAGE ) 1000 MG tablet TAKE 1 TABLET(1000 MG) BY MOUTH TWICE DAILY WITH A MEAL   omeprazole  (PRILOSEC) 20 MG capsule Take 2 capsules (40 mg total) by mouth daily.   ondansetron  (ZOFRAN -ODT) 4 MG disintegrating tablet Take 1 tablet (4 mg total) by mouth every 6 (six) hours as needed  for nausea or vomiting.   ONETOUCH DELICA LANCETS FINE MISC USE UP TO FOUR TIMES DAILY AS DIRECTED   ONETOUCH ULTRA test strip TEST FOUR TIMES DAILY   RYBELSUS  7 MG TABS TAKE 1 TABLET BY MOUTH DAILY   sennosides-docusate sodium  (SENOKOT-S) 8.6-50 MG tablet Take 1 tablet by mouth 2 (two) times daily. (Patient taking differently: Take 1 tablet by mouth 2 (two) times daily. Taking PRN)   tiZANidine  (ZANAFLEX ) 4 MG tablet Take 1 tablet (4 mg total) by mouth every 6 (six) hours as needed for muscle spasms.   traZODone  (DESYREL ) 100 MG tablet Take 1 tablet (100 mg total) by mouth at bedtime.   pregabalin  (LYRICA ) 25 MG capsule Take 1 capsule (25 mg total) by mouth 2 (two) times daily. (Patient not taking: Reported on 12/02/2023)   promethazine -dextromethorphan (PROMETHAZINE -DM) 6.25-15 MG/5ML syrup Take 5 mLs by mouth 4 (four) times daily as needed. (Patient not taking: Reported on 12/02/2023)   No facility-administered encounter medications on file as of 12/02/2023.    Allergies (verified) Penicillin g benzathine   History: Past Medical History:  Diagnosis Date   Allergy    Arthritis    Diabetes mellitus without complication (HCC)    GERD (gastroesophageal reflux disease)    Hyperlipidemia    Hypertension    Lumbago    Migraine    Past Surgical History:  Procedure Laterality Date   COLONOSCOPY WITH PROPOFOL  N/A 06/25/2016   Procedure: COLONOSCOPY WITH PROPOFOL ;  Surgeon: Therisa Bi, MD;  Location: [facility] ENDOSCOPY;  Service: Endoscopy;  Laterality: N/A;   ESOPHAGOGASTRODUODENOSCOPY (EGD) WITH PROPOFOL  N/A 06/25/2016   Procedure: ESOPHAGOGASTRODUODENOSCOPY (EGD) WITH PROPOFOL ;  Surgeon: Therisa Bi, MD;  Location: [facility] ENDOSCOPY;  Service: Endoscopy;  Laterality: N/A;   NO PAST SURGERIES     Family History  Problem Relation Age of Onset   Diabetes Mother    Heart disease Father    Hypertension Father    Eczema Sister    COPD Sister    Social History   Socioeconomic History    Marital status: Widowed    Spouse name: Not on file   Number of children: 0   Years of education: Not on file   Highest education level: Not on file  Occupational History   Occupation: retired  Tobacco Use   Smoking status: Never   Smokeless tobacco: Never  Vaping Use   Vaping status: Never Used  Substance and Sexual Activity   Alcohol use: No    Alcohol/week: 0.0 standard drinks of alcohol   Drug use: No   Sexual activity: Never  Other Topics Concern   Not on file  Social History Narrative   Not on file   Social Drivers of Health   Financial Resource Strain: Low Risk  (12/02/2023)   Overall Financial Resource Strain (CARDIA)    Difficulty of Paying Living Expenses: Not hard at all  Food Insecurity: No Food Insecurity (12/02/2023)   Hunger Vital Sign    Worried About Running Out of  Food in the Last Year: Never true    Ran Out of Food in the Last Year: Never true  Transportation Needs: No Transportation Needs (12/02/2023)   PRAPARE - Administrator, Civil Service (Medical): No    Lack of Transportation (Non-Medical): No  Physical Activity: Insufficiently Active (12/02/2023)   Exercise Vital Sign    Days of Exercise per Week: 6 days    Minutes of Exercise per Session: 20 min  Stress: No Stress Concern Present (12/02/2023)   [employer] of Occupational Health - Occupational Stress Questionnaire    Feeling of Stress: Only a little  Social Connections: Socially Isolated (12/02/2023)   Social Connection and Isolation Panel    Frequency of Communication with Friends and Family: More than three times a week    Frequency of Social Gatherings with Friends and Family: More than three times a week    Attends Religious Services: Never    Database administrator or Organizations: No    Attends Banker Meetings: Never    Marital Status: Widowed    Tobacco Counseling Counseling given: Not Answered    Clinical Intake:  Pre-visit preparation  completed: Yes  Pain : 0-10 Pain Score: 6  Pain Type: Chronic pain Pain Location: Foot Pain Orientation: Left, Right Pain Descriptors / Indicators: Aching     BMI - recorded: 26.96 Nutritional Status: BMI 25 -29 Overweight Nutritional Risks: None Diabetes: Yes CBG done?: No Did pt. bring in CBG monitor from home?: No  Lab Results  Component Value Date   HGBA1C 7.4 (H) 09/03/2023   HGBA1C 8.1 (H) 06/03/2023   HGBA1C 7.2 (H) 02/26/2023     How often do you need to have someone help you when you read instructions, pamphlets, or other written materials from your doctor or pharmacy?: 1 - Never  Interpreter Needed?: No  Information entered by :: Vina Ned, CMA   Activities of Daily Living     12/02/2023    3:52 PM  In your present state of health, do you have any difficulty performing the following activities:  Hearing? 0  Vision? 0  Difficulty concentrating or making decisions? 0  Walking or climbing stairs? 1  Comment due to foot and knee pain, uses a cane prn  Dressing or bathing? 0  Doing errands, shopping? 0  Preparing Food and eating ? N  Using the Toilet? N  In the past six months, have you accidently leaked urine? N  Do you have problems with loss of bowel control? N  Managing your Medications? N  Managing your Finances? N  Housekeeping or managing your Housekeeping? N    Patient Care Team: Cannady, Jolene T, NP as PCP - General (Nurse Practitioner) Carolee Manus DASEN., MD (Ophthalmology) Delores Orvin BRAVO, NP as Nurse Practitioner (Vascular Surgery) Cathlyn Seal, MD as Referring Physician (Dermatology) Silva Juliene SAUNDERS, DPM as Consulting Physician (Podiatry)  I have updated your Care Teams any recent Medical Services you may have received from other providers in the past year.     Assessment:   This is a routine wellness examination for Dejane.  Hearing/Vision screen Hearing Screening - Comments:: Denies hearing loss  Vision Screening -  Comments:: Gets DM eye exams, Dr. Manus Carolee, [facility]   Goals Addressed             This Visit's Progress    Patient Stated       Lose 10 more lbs       Depression  Screen     12/02/2023    4:03 PM 09/03/2023    8:45 AM 06/03/2023    9:13 AM 02/26/2023   11:08 AM 11/21/2022    9:38 AM 11/19/2022    3:21 PM 10/17/2022   10:43 AM  PHQ 2/9 Scores  PHQ - 2 Score 1 0 0 0 0 0 0  PHQ- 9 Score 3 2 2  0 2 0 4    Fall Risk     12/02/2023    4:07 PM 06/03/2023    9:10 AM 02/26/2023   11:08 AM 11/19/2022    3:25 PM 10/17/2022   10:42 AM  Fall Risk   Falls in the past year? 1 0 0 1 1  Number falls in past yr: 1 0 0 1 1  Injury with Fall? 0 0 0 0 0  Risk for fall due to : History of fall(s);Impaired balance/gait;Orthopedic patient No Fall Risks No Fall Risks History of fall(s);Orthopedic patient History of fall(s)  Follow up Falls evaluation completed;Education provided Falls evaluation completed Falls evaluation completed Education provided;Falls prevention discussed;Falls evaluation completed Falls evaluation completed    MEDICARE RISK AT HOME:  Medicare Risk at Home Any stairs in or around the home?: Yes If so, are there any without handrails?: No Home free of loose throw rugs in walkways, pet beds, electrical cords, etc?: Yes Adequate lighting in your home to reduce risk of falls?: Yes Life alert?: No Use of a cane, walker or w/c?: Yes (uses cane prn) Grab bars in the bathroom?: Yes Shower chair or bench in shower?: No Elevated toilet seat or a handicapped toilet?: No  TIMED UP AND GO:  Was the test performed?  No  Cognitive Function: 6CIT completed        12/02/2023    4:09 PM 11/19/2022    3:27 PM 11/01/2021   12:51 PM 10/31/2020    6:01 PM  6CIT Screen  What Year? 0 points 0 points 0 points 0 points  What month? 0 points 0 points 0 points 0 points  What time? 0 points 0 points 0 points 0 points  Count back from 20 0 points 0 points 0 points 0 points  Months  in reverse 0 points 0 points 0 points 0 points  Repeat phrase 2 points 0 points 0 points 10 points  Total Score 2 points 0 points 0 points 10 points    Immunizations Immunization History  Administered Date(s) Administered   Fluad Quad(high Dose 65+) 03/29/2019, 11/25/2019, 11/22/2020   Fluad Trivalent(High Dose 65+) 10/17/2022   INFLUENZA, HIGH DOSE SEASONAL PF 01/19/2016, 11/08/2016, 11/07/2017, 11/04/2021   Influenza-Unspecified 11/07/2014   Moderna Sars-Covid-2 Vaccination 04/20/2020, 05/22/2020   PNEUMOCOCCAL CONJUGATE-20 11/04/2021   Pneumococcal Conjugate-13 10/17/2015   Pneumococcal Polysaccharide-23 09/03/2011, 05/07/2017   Td 11/01/2021   Tdap 05/08/2010   Zoster, Live 03/25/2014    Screening Tests Health Maintenance  Topic Date Due   Zoster Vaccines- Shingrix (1 of 2) 05/31/2000   DEXA SCAN  06/01/2015   Colonoscopy  06/26/2019   Influenza Vaccine  09/19/2023   COVID-19 Vaccine (3 - 2025-26 season) 10/20/2023   Diabetic kidney evaluation - Urine ACR  02/26/2024   HEMOGLOBIN A1C  03/05/2024   Mammogram  07/07/2024   OPHTHALMOLOGY EXAM  07/09/2024   Diabetic kidney evaluation - eGFR measurement  09/02/2024   FOOT EXAM  09/02/2024   Medicare Annual Wellness (AWV)  12/01/2024   DTaP/Tdap/Td (3 - Td or Tdap) 11/02/2031   Pneumococcal Vaccine: 50+ Years  Completed   Hepatitis C Screening  Addressed   Meningococcal B Vaccine  Aged Out    Health Maintenance Items Addressed: Vaccines Due: flu and shingles, Referral sent to GI for colonoscopy, See Nurse Notes at the end of this note  Additional Screening:  Vision Screening: Recommended annual ophthalmology exams for early detection of glaucoma and other disorders of the eye. Is the patient up to date with their annual eye exam?  Yes  Who is the provider or what is the name of the office in which the patient attends annual eye exams? Dr. Manus Edison [facility]  Dental Screening: Recommended annual dental exams  for proper oral hygiene  Community Resource Referral / Chronic Care Management: CRR required this visit?  No   CCM required this visit?  No   Plan:    I have personally reviewed and noted the following in the patient's chart:   Medical and social history Use of alcohol, tobacco or illicit drugs  Current medications and supplements including opioid prescriptions. Patient is not currently taking opioid prescriptions. Functional ability and status Nutritional status Physical activity Advanced directives List of other physicians Hospitalizations, surgeries, and ER visits in previous 12 months Vitals Screenings to include cognitive, depression, and falls Referrals and appointments  In addition, I have reviewed and discussed with patient certain preventive protocols, quality metrics, and best practice recommendations. A written personalized care plan for preventive services as well as general preventive health recommendations were provided to patient.   Vina Ned, CMA   12/02/2023   After Visit Summary: (Mail) Due to this being a telephonic visit, the after visit summary with patients personalized plan was offered to patient via mail   Notes:  6 CIT Score - 2 Needs flu vaccine at OV on 12/08/23 Needs Shingrix vaccine (pharmacy) Placed referral to [facility] for a colonoscopy (was due 06/2019) Gave ph# to schedule [facility] (order placed 09/03/23) Declined DEXA scan Declined DM & Nutrition education referral Declined Covid vaccine

## 2023-12-02 NOTE — Patient Instructions (Signed)
 Cheryl Mendez,  Thank you for taking the time for your Medicare Wellness Visit. I appreciate your continued commitment to your health goals. Please review the care plan we discussed, and feel free to reach out if I can assist you further.  Medicare recommends these wellness visits once per year to help you and your care team stay ahead of potential health issues. These visits are designed to focus on prevention, allowing your provider to concentrate on managing your acute and chronic conditions during your regular appointments.  Please note that Annual Wellness Visits do not include a physical exam. Some assessments may be limited, especially if the visit was conducted virtually. If needed, we may recommend a separate in-person follow-up with your provider.  Ongoing Care Seeing your primary care provider every 3 to 6 months helps us  monitor your health and provide consistent, personalized care.   Referrals If a referral was made during today's visit and you haven't received any updates within two weeks, please contact the referred provider directly to check on the status.         Gastroenterology (GI) for a colonoscopy        [facility]         [address]         [address]         [place], [place] [address]         [phone]           Recommended Screenings:  Get a flu vaccine at your next OV on 12/08/23. Get the Shingrix vaccines at your local pharmacy at your convenience. Please call to schedule your mammogram:  [facility] Address: [address], [place], [place] Phone: [phone]   Health Maintenance  Topic Date Due   Zoster (Shingles) Vaccine (1 of 2) 05/31/2000   DEXA scan (bone density measurement)  06/01/2015   Colon Cancer Screening  06/26/2019   Flu Shot  09/19/2023   COVID-19 Vaccine (3 - 2025-26 season) 10/20/2023   Yearly kidney health urinalysis for diabetes   02/26/2024   Hemoglobin A1C  03/05/2024   Breast Cancer Screening  07/07/2024   Eye exam for diabetics  07/09/2024   Yearly kidney function blood test for diabetes  09/02/2024   Complete foot exam   09/02/2024   Medicare Annual Wellness Visit  12/01/2024   DTaP/Tdap/Td vaccine (3 - Td or Tdap) 11/02/2031   Pneumococcal Vaccine for age over 41  Completed   Hepatitis C Screening  Addressed   Meningitis B Vaccine  Aged Out       12/02/2023    4:06 PM  Advanced Directives  Does Patient Have a Medical Advance Directive? No  Would patient like information on creating a medical advance directive? No - Patient declined   Advance Care Planning is important because it: Ensures you receive medical care that aligns with your values, goals, and preferences. Provides guidance to your family and loved ones, reducing the emotional burden of decision-making during critical moments.  Vision: Annual vision screenings are recommended for early detection of glaucoma, cataracts, and diabetic retinopathy. These exams can also reveal signs of chronic conditions such as diabetes and high blood pressure.  Dental: Annual dental screenings help detect early signs of oral cancer, gum disease, and other conditions linked to overall health, including heart disease and diabetes.  Please see the attached documents for additional preventive care recommendations.   Fall Prevention in the  Home, Adult Falls can cause injuries and affect people of all ages. There are many simple things that you can do to make your home safe and to help prevent falls. If you need it, ask for help making these changes. What actions can I take to prevent falls? General information Use good lighting in all rooms. Make sure to: Replace any light bulbs that burn out. Turn on lights if it is dark and use night-lights. Keep items that you use often in easy-to-reach places. Lower the shelves around your home if needed. Move furniture so  that there are clear paths around it. Do not keep throw rugs or other things on the floor that can make you trip. If any of your floors are uneven, fix them. Add color or contrast paint or tape to clearly mark and help you see: Grab bars or handrails. First and last steps of staircases. Where the edge of each step is. If you use a ladder or stepladder: Make sure that it is fully opened. Do not climb a closed ladder. Make sure the sides of the ladder are locked in place. Have someone hold the ladder while you use it. Know where your pets are as you move through your home. What can I do in the bathroom?     Keep the floor dry. Clean up any water that is on the floor right away. Remove soap buildup in the bathtub or shower. Buildup makes bathtubs and showers slippery. Use non-skid mats or decals on the floor of the bathtub or shower. Attach bath mats securely with double-sided, non-slip rug tape. If you need to sit down while you are in the shower, use a non-slip stool. Install grab bars by the toilet and in the bathtub and shower. Do not use towel bars as grab bars. What can I do in the bedroom? Make sure that you have a light by your bed that is easy to reach. Do not use any sheets or blankets on your bed that hang to the floor. Have a firm bench or chair with side arms that you can use for support when you get dressed. What can I do in the kitchen? Clean up any spills right away. If you need to reach something above you, use a sturdy step stool that has a grab bar. Keep electrical cables out of the way. Do not use floor polish or wax that makes floors slippery. What can I do with my stairs? Do not leave anything on the stairs. Make sure that you have a light switch at the top and the bottom of the stairs. Have them installed if you do not have them. Make sure that there are handrails on both sides of the stairs. Fix handrails that are broken or loose. Make sure that handrails are as  long as the staircases. Install non-slip stair treads on all stairs in your home if they do not have carpet. Avoid having throw rugs at the top or bottom of stairs, or secure the rugs with carpet tape to prevent them from moving. Choose a carpet design that does not hide the edge of steps on the stairs. Make sure that carpet is firmly attached to the stairs. Fix any carpet that is loose or worn. What can I do on the outside of my home? Use bright outdoor lighting. Repair the edges of walkways and driveways and fix any cracks. Clear paths of anything that can make you trip, such as tools or rocks. Add color or contrast  paint or tape to clearly mark and help you see high doorway thresholds. Trim any bushes or trees on the main path into your home. Check that handrails are securely fastened and in good repair. Both sides of all steps should have handrails. Install guardrails along the edges of any raised decks or porches. Have leaves, snow, and ice cleared regularly. Use sand, salt, or ice melt on walkways during winter months if you live where there is ice and snow. In the garage, clean up any spills right away, including grease or oil spills. What other actions can I take? Review your medicines with your health care provider. Some medicines can make you confused or feel dizzy. This can increase your chance of falling. Wear closed-toe shoes that fit well and support your feet. Wear shoes that have rubber soles and low heels. Use a cane, walker, scooter, or crutches that help you move around if needed. Talk with your provider about other ways that you can decrease your risk of falls. This may include seeing a physical therapist to learn to do exercises to improve movement and strength. Where to find more information Centers for Disease Control and Prevention, STEADI: TonerPromos.no [employer] on Aging: BaseRingTones.pl National Institute on Aging: BaseRingTones.pl Contact a health care provider if: You  are afraid of falling at home. You feel weak, drowsy, or dizzy at home. You fall at home. Get help right away if you: Lose consciousness or have trouble moving after a fall. Have a fall that causes a head injury. These symptoms may be an emergency. Get help right away. Call 911. Do not wait to see if the symptoms will go away. Do not drive yourself to the hospital. This information is not intended to replace advice given to you by your health care provider. Make sure you discuss any questions you have with your health care provider. Document Revised: 10/08/2021 Document Reviewed: 10/08/2021 Elsevier Patient Education  2024 [employer].

## 2023-12-03 NOTE — Telephone Encounter (Signed)
 Requested medication (s) are due for refill today: yes  Requested medication (s) are on the active medication list: yes  Last refill:  02/26/23  Future visit scheduled: yes  Notes to clinic:  Unable to refill per protocol, cannot delegate.      Requested Prescriptions  Pending Prescriptions Disp Refills   pregabalin  (LYRICA ) 25 MG capsule [Pharmacy Med Name: PREGABALIN  25MG  CAPSULES] 60 capsule     Sig: TAKE 1 CAPSULE(25 MG) BY MOUTH TWICE DAILY     Not Delegated - Neurology:  Anticonvulsants - Controlled - pregabalin  Failed - 12/03/2023  2:03 PM      Failed - This refill cannot be delegated      Passed - Cr in normal range and within 360 days    Creatinine  Date Value Ref Range Status  06/09/2014 0.73 mg/dL Final    Comment:    9.55-8.99 NOTE: New Reference Range  04/26/14    Creatinine, Ser  Date Value Ref Range Status  09/03/2023 0.64 0.57 - 1.00 mg/dL Final         Passed - Completed PHQ-2 or PHQ-9 in the last 360 days      Passed - Valid encounter within last 12 months    Recent Outpatient Visits           1 month ago Acute non-recurrent frontal sinusitis   [facility] [place], [place] T, NP   3 months ago Poorly controlled type 2 diabetes mellitus (HCC)   [facility] [place], [place] T, NP   5 months ago Tachycardia   [facility] [place], [place] T, NP   6 months ago Poorly controlled type 2 diabetes mellitus (HCC)   [facility] [place], [place] T, NP   7 months ago Acute non-recurrent frontal sinusitis    [facility] [place], Melanie DASEN, NP

## 2023-12-07 NOTE — Patient Instructions (Signed)
 Be Involved in Caring For Your Health:  Taking Medications When medications are taken as directed, they can greatly improve your health. But if they are not taken as prescribed, they may not work. In some cases, not taking them correctly can be harmful. To help ensure your treatment remains effective and safe, understand your medications and how to take them. Bring your medications to each visit for review by your provider.  Your lab results, notes, and after visit summary will be available on My Chart. We strongly encourage you to use this feature. If lab results are abnormal the clinic will contact you with the appropriate steps. If the clinic does not contact you assume the results are satisfactory. You can always view your results on My Chart. If you have questions regarding your health or results, please contact the clinic during office hours. You can also ask questions on My Chart.  We at [facility] are grateful that you chose us  to provide your care. We strive to provide evidence-based and compassionate care and are always looking for feedback. If you get a survey from the clinic please complete this so we can hear your opinions.  Healthy Eating, Adult Healthy eating may help you get and keep a healthy body weight, reduce the risk of chronic disease, and live a long and productive life. It is important to follow a healthy eating pattern. Your nutritional and calorie needs should be met mainly by different nutrient-rich foods. What are tips for following this plan? Reading food labels Read labels and choose the following: Reduced or low sodium products. Juices with 100% fruit juice. Foods with low saturated fats (<3 g per serving) and high polyunsaturated and monounsaturated fats. Foods with whole grains, such as whole wheat, cracked wheat, brown rice, and wild rice. Whole grains that are fortified with folic acid. This is recommended for females who are pregnant or who want to  become pregnant. Read labels and do not eat or drink the following: Foods or drinks with added sugars. These include foods that contain brown sugar, corn sweetener, corn syrup, dextrose , fructose, glucose, high-fructose corn syrup, honey, invert sugar, lactose, malt syrup, maltose, molasses, raw sugar, sucrose, trehalose, or turbinado sugar. Limit your intake of added sugars to less than 10% of your total daily calories. Do not eat more than the following amounts of added sugar per day: 6 teaspoons (25 g) for females. 9 teaspoons (38 g) for males. Foods that contain processed or refined starches and grains. Refined grain products, such as white flour, degermed cornmeal, white bread, and white rice. Shopping Choose nutrient-rich snacks, such as vegetables, whole fruits, and nuts. Avoid high-calorie and high-sugar snacks, such as potato chips, fruit snacks, and candy. Use oil-based dressings and spreads on foods instead of solid fats such as butter, margarine, sour cream, or cream cheese. Limit pre-made sauces, mixes, and instant products such as flavored rice, instant noodles, and ready-made pasta. Try more plant-protein sources, such as tofu, tempeh, black beans, edamame, lentils, nuts, and seeds. Explore eating plans such as the Mediterranean diet or vegetarian diet. Try heart-healthy dips made with beans and healthy fats like hummus and guacamole. Vegetables go great with these. Cooking Use oil to saut or stir-fry foods instead of solid fats such as butter, margarine, or lard. Try baking, boiling, grilling, or broiling instead of frying. Remove the fatty part of meats before cooking. Steam vegetables in water  or broth. Meal planning  At meals, imagine dividing your plate into fourths: One-half of  your plate is fruits and vegetables. One-fourth of your plate is whole grains. One-fourth of your plate is protein, especially lean meats, poultry, eggs, tofu, beans, or nuts. Include low-fat  dairy as part of your daily diet. Lifestyle Choose healthy options in all settings, including home, work, school, restaurants, or stores. Prepare your food safely: Wash your hands after handling raw meats. Where you prepare food, keep surfaces clean by regularly washing with hot, soapy water . Keep raw meats separate from ready-to-eat foods, such as fruits and vegetables. Cook seafood, meat, poultry, and eggs to the recommended temperature. Get a food thermometer. Store foods at safe temperatures. In general: Keep cold foods at 84F (4.4C) or below. Keep hot foods at 184F (60C) or above. Keep your freezer at [facility] (-17.8C) or below. Foods are not safe to eat if they have been between the temperatures of 40-184F (4.4-60C) for more than 2 hours. What foods should I eat? Fruits Aim to eat 1-2 cups of fresh, canned (in natural juice), or frozen fruits each day. One cup of fruit equals 1 small apple, 1 large banana, 8 large strawberries, 1 cup (237 g) canned fruit,  cup (82 g) dried fruit, or 1 cup (240 mL) 100% juice. Vegetables Aim to eat 2-4 cups of fresh and frozen vegetables each day, including different varieties and colors. One cup of vegetables equals 1 cup (91 g) broccoli or cauliflower florets, 2 medium carrots, 2 cups (150 g) raw, leafy greens, 1 large tomato, 1 large bell pepper, 1 large sweet potato, or 1 medium white potato. Grains Aim to eat 5-10 ounce-equivalents of whole grains each day. Examples of 1 ounce-equivalent of grains include 1 slice of bread, 1 cup (40 g) ready-to-eat cereal, 3 cups (24 g) popcorn, or  cup (93 g) cooked rice. Meats and other proteins Try to eat 5-7 ounce-equivalents of protein each day. Examples of 1 ounce-equivalent of protein include 1 egg,  oz nuts (12 almonds, 24 pistachios, or 7 walnut halves), 1/4 cup (90 g) cooked beans, 6 tablespoons (90 g) hummus or 1 tablespoon (16 g) peanut butter. A cut of meat or fish that is the size of a deck of  cards is about 3-4 ounce-equivalents (85 g). Of the protein you eat each week, try to have at least 8 sounce (227 g) of seafood. This is about 2 servings per week. This includes salmon, trout, herring, sardines, and anchovies. Dairy Aim to eat 3 cup-equivalents of fat-free or low-fat dairy each day. Examples of 1 cup-equivalent of dairy include 1 cup (240 mL) milk, 8 ounces (250 g) yogurt, 1 ounces (44 g) natural cheese, or 1 cup (240 mL) fortified soy milk. Fats and oils Aim for about 5 teaspoons (21 g) of fats and oils per day. Choose monounsaturated fats, such as canola and olive oils, mayonnaise made with olive oil or avocado oil, avocados, peanut butter, and most nuts, or polyunsaturated fats, such as sunflower, corn, and soybean oils, walnuts, pine nuts, sesame seeds, sunflower seeds, and flaxseed. Beverages Aim for 6 eight-ounce glasses of water  per day. Limit coffee to 3-5 eight-ounce cups per day. Limit caffeinated beverages that have added calories, such as soda and energy drinks. If you drink alcohol: Limit how much you have to: 0-1 drink a day if you are female. 0-2 drinks a day if you are female. Know how much alcohol is in your drink. In the [place] one drink is one 12 oz bottle of beer (355 mL), one 5 oz glass of wine (  148 mL), or one 1 oz glass of hard liquor (44 mL). Seasoning and other foods Try not to add too much salt to your food. Try using herbs and spices instead of salt. Try not to add sugar to food. This information is based on [place] nutrition guidelines. To learn more, visit DisposableNylon.be. Exact amounts may vary. You may need different amounts. This information is not intended to replace advice given to you by your health care provider. Make sure you discuss any questions you have with your health care provider. Document Revised: 11/05/2021 Document Reviewed: 11/05/2021 Elsevier Patient Education  2024 [employer].

## 2023-12-08 ENCOUNTER — Encounter: Payer: Self-pay | Admitting: Nurse Practitioner

## 2023-12-08 ENCOUNTER — Ambulatory Visit: Admitting: Nurse Practitioner

## 2023-12-08 VITALS — BP 128/80 | HR 83 | Temp 97.8°F | Resp 14 | Ht 65.0 in | Wt 166.0 lb

## 2023-12-08 DIAGNOSIS — E1169 Type 2 diabetes mellitus with other specified complication: Secondary | ICD-10-CM | POA: Diagnosis not present

## 2023-12-08 DIAGNOSIS — E119 Type 2 diabetes mellitus without complications: Secondary | ICD-10-CM | POA: Diagnosis not present

## 2023-12-08 DIAGNOSIS — Z23 Encounter for immunization: Secondary | ICD-10-CM

## 2023-12-08 DIAGNOSIS — M1990 Unspecified osteoarthritis, unspecified site: Secondary | ICD-10-CM | POA: Diagnosis not present

## 2023-12-08 DIAGNOSIS — Z7984 Long term (current) use of oral hypoglycemic drugs: Secondary | ICD-10-CM

## 2023-12-08 DIAGNOSIS — E785 Hyperlipidemia, unspecified: Secondary | ICD-10-CM | POA: Diagnosis not present

## 2023-12-08 DIAGNOSIS — F5101 Primary insomnia: Secondary | ICD-10-CM

## 2023-12-08 DIAGNOSIS — E1159 Type 2 diabetes mellitus with other circulatory complications: Secondary | ICD-10-CM | POA: Diagnosis not present

## 2023-12-08 DIAGNOSIS — I152 Hypertension secondary to endocrine disorders: Secondary | ICD-10-CM

## 2023-12-08 DIAGNOSIS — E1165 Type 2 diabetes mellitus with hyperglycemia: Secondary | ICD-10-CM | POA: Diagnosis not present

## 2023-12-08 LAB — BAYER DCA HB A1C WAIVED: HB A1C (BAYER DCA - WAIVED): 7.6 % — ABNORMAL HIGH (ref 4.8–5.6)

## 2023-12-08 MED ORDER — ATORVASTATIN CALCIUM 40 MG PO TABS: 40.0000 mg | ORAL_TABLET | Freq: Every day | ORAL | 4 refills | Status: AC

## 2023-12-08 MED ORDER — ONDANSETRON 4 MG PO TBDP: 4.0000 mg | ORAL_TABLET | Freq: Four times a day (QID) | ORAL | 0 refills | Status: AC | PRN

## 2023-12-08 MED ORDER — TRAZODONE HCL 100 MG PO TABS: 100.0000 mg | ORAL_TABLET | Freq: Every day | ORAL | 4 refills | Status: DC

## 2023-12-08 MED ORDER — RYBELSUS 14 MG PO TABS: 14.0000 mg | ORAL_TABLET | Freq: Every day | ORAL | 4 refills | Status: DC

## 2023-12-08 NOTE — Assessment & Plan Note (Signed)
 Chronic, ongoing.  Continue current medication regimen and adjust as needed. Lipid panel today.

## 2023-12-08 NOTE — Assessment & Plan Note (Signed)
 Continues to have shoulder and knee pain. Plans on returning to [facility] to further discuss next steps.

## 2023-12-08 NOTE — Assessment & Plan Note (Signed)
Refer to poorly controlled diabetes plan of care. 

## 2023-12-08 NOTE — Assessment & Plan Note (Signed)
 Chronic, ongoing.  A1c 7.6% today, trend up from 7.4%. Urine ALB 20 March 2023 - on ACE. Continue Lotensin  for kidney protection.  - Recommend she take Metformin  1000 MG BID as ordered and will trial increasing Rybelsus  to 14 MG daily -- would like to avoid insulin, however if does not tolerate will return to 7 MG. Did not tolerate SGLT2, Glipizide , or GLP1 injections in past. Continue diet changes at home. Recommend to monitor BS daily at home and document daily with fasting BS goal of <130.  Monitor diet closely with focus on diabetic diet and regular exercise.  - Continue Lyrica  for neuropathy which she is finding benefit from - Eye and Foot exam up to date - On ACE and statin therapy - Vaccinations up to date

## 2023-12-08 NOTE — Progress Notes (Signed)
 BP 128/80 (BP Location: Left Arm, Patient Position: Sitting, Cuff Size: Normal)   Pulse 83   Temp 97.8 F (36.6 C) (Oral)   Resp 14   Ht 5' 5 (1.651 m)   Wt 166 lb (75.3 kg)   LMP  (LMP Unknown) Comment: age 73  SpO2 99%   BMI 27.62 kg/m    Subjective:    Patient ID: Cheryl Mendez, female    DOB: 1950/11/19, 73 y.o.   MRN: [id number]  HPI: Cheryl Mendez is a 73 y.o. female  Chief Complaint  Patient presents with   Diabetes    Doing pretty good and home checks a couple times a week, 143 last she remembers.    Hypertension    No home checks.    Knee Pain    Left knee. Last injected was 08/21/2022 would like another if possible.    Plans to return [facility] for shoulder and knee pain.  Had a recent steroid shot in shoulder, but it did not help much.   DIABETES A1c 7.4% July. Taking Rybelsus  7 MG (higher doses made her nauseous, but she would like to try to go up a little again and monitor) and Metformin  1000 MG BID (occasionally forgets afternoon dose).  Takes B12 and Lyrica  for neuropathy discomfort, offers benefit.    History: Trulicity  with GI issues and Invokana  stopped due to foot concerns + Jardiance  made her sick.  Glipizide  made her feel unwell and sugars too low.  Hypoglycemic episodes:no Polydipsia/polyuria: no Visual disturbance: no Chest pain: no Paresthesias: no Glucose Monitoring: yes  Accucheck frequency: a few days a week  Fasting glucose: 150 something on Monday  Post prandial:  Evening:  Before meals: Taking Insulin?: no  Long acting insulin:  Short acting insulin: Blood Pressure Monitoring: not checking Retinal Examination: Up to Date - Dr. Carolee New Exam: Up to Date Diabetic Education: Completed Pneumovax: Up to Date Influenza: Up to Date Aspirin: yes   HYPERTENSION / HYPERLIPIDEMIA Taking Lotensin  20-25 and Atorvastatin  10 MG. . Satisfied with current treatment? yes Duration of hypertension: chronic BP monitoring frequency: not  checking BP range:  BP medication side effects: no Duration of hyperlipidemia: chronic Cholesterol medication side effects: no Cholesterol supplements: none Medication compliance: good compliance Aspirin: yes Recent stressors: no Recurrent headaches: no Visual changes: no Palpitations: no Dyspnea: no Chest pain: no Lower extremity edema: a little bit recently Dizzy/lightheaded: no   Relevant past medical, surgical, family and social history reviewed and updated as indicated. Interim medical history since our last visit reviewed. Allergies and medications reviewed and updated.  Review of Systems  Constitutional:  Negative for activity change, appetite change, diaphoresis, fatigue and fever.  HENT: Negative.    Respiratory:  Negative for cough, chest tightness, shortness of breath and wheezing.   Cardiovascular:  Negative for chest pain, palpitations and leg swelling.  Gastrointestinal: Negative.   Endocrine: Negative for cold intolerance, heat intolerance, polydipsia, polyphagia and polyuria.  Neurological: Negative.   Psychiatric/Behavioral:  Negative for decreased concentration, self-injury, sleep disturbance and suicidal ideas. The patient is not nervous/anxious.    Per HPI unless specifically indicated above     Objective:    BP 128/80 (BP Location: Left Arm, Patient Position: Sitting, Cuff Size: Normal)   Pulse 83   Temp 97.8 F (36.6 C) (Oral)   Resp 14   Ht 5' 5 (1.651 m)   Wt 166 lb (75.3 kg)   LMP  (LMP Unknown) Comment: age 57  SpO2  99%   BMI 27.62 kg/m   Wt Readings from Last 3 Encounters:  12/08/23 166 lb (75.3 kg)  12/02/23 162 lb (73.5 kg)  10/14/23 166 lb 9.6 oz (75.6 kg)    Physical Exam Vitals and nursing note reviewed.  Constitutional:      General: She is awake. She is not in acute distress.    Appearance: She is well-developed and well-groomed. She is not ill-appearing or toxic-appearing.  HENT:     Head: Normocephalic.     Right Ear:  Hearing and external ear normal.     Left Ear: Hearing and external ear normal.  Eyes:     General: Lids are normal.        Right eye: No discharge.        Left eye: No discharge.     Extraocular Movements: Extraocular movements intact.     Conjunctiva/sclera: Conjunctivae normal.     Pupils: Pupils are equal, round, and reactive to light.     Visual Fields: Right eye visual fields normal and left eye visual fields normal.  Neck:     Thyroid : No thyromegaly.     Vascular: No carotid bruit.  Cardiovascular:     Rate and Rhythm: Normal rate and regular rhythm.     Heart sounds: Normal heart sounds. No murmur heard.    No gallop.  Pulmonary:     Effort: Pulmonary effort is normal. No accessory muscle usage or respiratory distress.     Breath sounds: Normal breath sounds.  Abdominal:     General: Bowel sounds are normal. There is no distension.     Palpations: Abdomen is soft.     Tenderness: There is no abdominal tenderness.  Musculoskeletal:     Cervical back: Normal range of motion and neck supple.     Right lower leg: No edema.     Left lower leg: No edema.  Lymphadenopathy:     Cervical: No cervical adenopathy.  Skin:    General: Skin is warm and dry.  Neurological:     Mental Status: She is alert and oriented to person, place, and time.     Deep Tendon Reflexes: Reflexes are normal and symmetric.     Reflex Scores:      Brachioradialis reflexes are 2+ on the right side and 2+ on the left side.      Patellar reflexes are 2+ on the right side and 2+ on the left side. Psychiatric:        Attention and Perception: Attention normal.        Mood and Affect: Mood normal.        Speech: Speech normal.        Behavior: Behavior normal. Behavior is cooperative.        Thought Content: Thought content normal.    Results for orders placed or performed in visit on 12/08/23  Bayer DCA Hb A1c Waived   Collection Time: 12/08/23  8:55 AM  Result Value Ref Range   HB A1C (BAYER DCA  - WAIVED) 7.6 (H) 4.8 - 5.6 %      Assessment & Plan:   Problem List Items Addressed This Visit       Cardiovascular and Mediastinum   Hypertension associated with diabetes (HCC)   Chronic, stable.  BP at goal in office today. Continue current medication regimen and adjust as needed -- Lotensin  offering kidney protection.  Recommend checking BP at home three mornings a week and documenting for provider review.  LABS: CMP.  Urine ALB 20 March 2023.  Return in 3 months.      Relevant Medications   Semaglutide  (RYBELSUS ) 14 MG TABS   atorvastatin  (LIPITOR) 40 MG tablet   Other Relevant Orders   Bayer DCA Hb A1c Waived (Completed)   Comprehensive metabolic panel with GFR     Endocrine   Poorly controlled type 2 diabetes mellitus (HCC) - Primary   Chronic, ongoing.  A1c 7.6% today, trend up from 7.4%. Urine ALB 20 March 2023 - on ACE. Continue Lotensin  for kidney protection.  - Recommend she take Metformin  1000 MG BID as ordered and will trial increasing Rybelsus  to 14 MG daily -- would like to avoid insulin, however if does not tolerate will return to 7 MG. Did not tolerate SGLT2, Glipizide , or GLP1 injections in past. Continue diet changes at home. Recommend to monitor BS daily at home and document daily with fasting BS goal of <130.  Monitor diet closely with focus on diabetic diet and regular exercise.  - Continue Lyrica  for neuropathy which she is finding benefit from - Eye and Foot exam up to date - On ACE and statin therapy - Vaccinations up to date      Relevant Medications   Semaglutide  (RYBELSUS ) 14 MG TABS   atorvastatin  (LIPITOR) 40 MG tablet   Other Relevant Orders   Bayer DCA Hb A1c Waived (Completed)   Hyperlipidemia associated with type 2 diabetes mellitus (HCC)   Chronic, ongoing.  Continue current medication regimen and adjust as needed.  Lipid panel today.      Relevant Medications   Semaglutide  (RYBELSUS ) 14 MG TABS   atorvastatin  (LIPITOR) 40 MG tablet    Other Relevant Orders   Bayer DCA Hb A1c Waived (Completed)   Comprehensive metabolic panel with GFR   Lipid Panel w/o Chol/HDL Ratio   Diabetes mellitus treated with oral medication (HCC)   Refer to poorly controlled diabetes plan of care.      Relevant Medications   Semaglutide  (RYBELSUS ) 14 MG TABS   atorvastatin  (LIPITOR) 40 MG tablet   Other Relevant Orders   Bayer DCA Hb A1c Waived (Completed)     Musculoskeletal and Integument   Arthritis   Continues to have shoulder and knee pain. Plans on returning to [facility] to further discuss next steps.      Other Visit Diagnoses       Flu vaccine need       Flu vaccine today, educated patient.   Relevant Orders   Flu vaccine HIGH DOSE PF(Fluzone Trivalent) (Completed)        Follow up plan: Return in about 6 weeks (around 01/19/2024) for R2DM -- increase Rybelsus  to 14 MG daily.

## 2023-12-08 NOTE — Assessment & Plan Note (Signed)
 Chronic, stable.  BP at goal in office today.  Continue current medication regimen and adjust as needed -- Lotensin  offering kidney protection.  Recommend checking BP at home three mornings a week and documenting for provider review.  LABS: CMP.  Urine ALB 20 March 2023.  Return in 3 months.

## 2023-12-09 ENCOUNTER — Ambulatory Visit: Payer: Self-pay | Admitting: Nurse Practitioner

## 2023-12-09 LAB — COMPREHENSIVE METABOLIC PANEL WITH GFR
ALT: 20 IU/L (ref 0–32)
AST: 18 IU/L (ref 0–40)
Albumin: 4.3 g/dL (ref 3.8–4.8)
Alkaline Phosphatase: 51 IU/L (ref 49–135)
BUN/Creatinine Ratio: 22 (ref 12–28)
BUN: 14 mg/dL (ref 8–27)
Bilirubin Total: 0.5 mg/dL (ref 0.0–1.2)
CO2: 25 mmol/L (ref 20–29)
Calcium: 9.9 mg/dL (ref 8.7–10.3)
Chloride: 99 mmol/L (ref 96–106)
Creatinine, Ser: 0.65 mg/dL (ref 0.57–1.00)
Globulin, Total: 2.6 g/dL (ref 1.5–4.5)
Glucose: 161 mg/dL — ABNORMAL HIGH (ref 70–99)
Potassium: 4.1 mmol/L (ref 3.5–5.2)
Sodium: 141 mmol/L (ref 134–144)
Total Protein: 6.9 g/dL (ref 6.0–8.5)
eGFR: 93 mL/min/1.73 (ref >59)

## 2023-12-09 LAB — LIPID PANEL W/O CHOL/HDL RATIO
Cholesterol, Total: 168 mg/dL (ref 100–199)
HDL: 42 mg/dL (ref >39)
LDL Chol Calc (NIH): 95 mg/dL (ref 0–99)
Triglycerides: 179 mg/dL — ABNORMAL HIGH (ref 0–149)
VLDL Cholesterol Cal: 31 mg/dL (ref 5–40)

## 2023-12-09 NOTE — Progress Notes (Signed)
 Contacted via MyChart  Good afternoon Emile, your labs have returned: - Kidney function, creatinine and eGFR, remains normal, as is liver function, AST and ALT.  - Lipid panel continues to show LDL, bad cholesterol, above goal. I would recommend increasing your Atorvastatin  to 80 MG daily.  Would you be okay with this? Let me know and I can send in.  Any questions? Keep being stellar!!  Thank you for allowing me to participate in your care.  I appreciate you. Kindest regards, Kodie Kishi

## 2023-12-10 DIAGNOSIS — M7541 Impingement syndrome of right shoulder: Secondary | ICD-10-CM | POA: Diagnosis not present

## 2023-12-10 DIAGNOSIS — M1712 Unilateral primary osteoarthritis, left knee: Secondary | ICD-10-CM | POA: Diagnosis not present

## 2023-12-17 ENCOUNTER — Telehealth: Payer: Self-pay

## 2023-12-17 NOTE — Telephone Encounter (Signed)
 Copied from CRM #[id number]. Topic: Clinical - Medication Question >> Dec 17, 2023 11:41 AM Charlet HERO wrote: Reason for CRM: Patient is calling about the warning for atrovastan and she is stating that there is something is wrong with the pill and she wants to know if she stop taking them. It she is stating it was mostly for the 90 day supply and she just got her 90 day supply today and she has not taken it today. Also states that [facility] wants a mri done on her arm bc of rotator cuff.

## 2023-12-22 NOTE — Telephone Encounter (Signed)
 Patient aware that she should contact pharmacy to determine if the one she is taking is directly affected. As for the MRI she will ask [facility] to go ahead and order. If not aware she will need an appointment.

## 2023-12-23 ENCOUNTER — Ambulatory Visit: Payer: Self-pay | Admitting: *Deleted

## 2023-12-23 NOTE — Telephone Encounter (Signed)
 FYI Only or Action Required?: FYI only for provider: appointment scheduled on 11/6- wants to come sooner if possible.  Patient was last seen in primary care on 12/08/2023 by Valerio Melanie DASEN, NP.  Called Nurse Triage reporting Generalized Body Aches (Low grade fever, nausea, abdominal pain).  Symptoms began several days ago.  Interventions attempted: Rest, hydration, or home remedies.  Symptoms are: gradually worsening.  Triage Disposition: See PCP When Office is Open (Within 3 Days)  Patient/caregiver understands and will follow disposition?: Yes  Copied from CRM [id number]. Topic: Clinical - Red Word Triage >> Dec 23, 2023  1:48 PM Hadassah PARAS wrote: Red Word that prompted transfer to Nurse Triage: Pt believes she has COVID- stomach pain, low grade fever, feels achey Reason for Disposition  [1] MODERATE pain (e.g., interferes with normal activities) AND [2] present > 3 days  Nausea lasts > 1 week  Answer Assessment - Initial Assessment Questions Patient reports she is not feeling well in general- feels yucky wants to be tested for COVID. Patient has been scheduled but would like to come in sooner if she could. Patient advised I would send request.   1. ONSET: When did the muscle aches or body pains start?      Body aches gradual after other symptoms. 2. LOCATION: What part of your body is hurting? (e.g., entire body, arms, legs)      Arms/legs- fatigued, weak 3. SEVERITY: How bad is the pain? (Scale 1-10; or mild, moderate, severe)     Fatigued- moderate 4. CAUSE: What do you think is causing the pains?     unsure 5. FEVER: Do you have a fever? If Yes, ask: What is your temperature, how was it measured, and  when did it start?      Has not measured- patient reports she can tell 6. OTHER SYMPTOMS: Do you have any other symptoms? (e.g., chest pain, cold or flu symptoms, rash, weakness, weight loss)     Lack of appetitie, achy  Answer Assessment - Initial Assessment  Questions 1. NAUSEA SEVERITY: How bad is the nausea? (e.g., mild, moderate, severe; dehydration, weight loss)     Decreased appetitive with nausea 2. ONSET: When did the nausea begin?     Several days 3. VOMITING: Any vomiting? If Yes, ask: How many times today?     no 4. RECURRENT SYMPTOM: Have you had nausea before? If Yes, ask: When was the last time? What happened that time?     Not lately 5. CAUSE: What do you think is causing the nausea?     Patient states she is not sure- but feels like when she had COVID before  Protocols used: Nausea-A-AH, Muscle Aches and Body Pain-A-AH

## 2023-12-25 ENCOUNTER — Ambulatory Visit (INDEPENDENT_AMBULATORY_CARE_PROVIDER_SITE_OTHER): Admitting: Nurse Practitioner

## 2023-12-25 ENCOUNTER — Encounter: Payer: Self-pay | Admitting: Nurse Practitioner

## 2023-12-25 VITALS — BP 121/83 | HR 98 | Temp 97.9°F | Ht 65.0 in | Wt 161.6 lb

## 2023-12-25 DIAGNOSIS — R1032 Left lower quadrant pain: Secondary | ICD-10-CM | POA: Diagnosis not present

## 2023-12-25 DIAGNOSIS — R051 Acute cough: Secondary | ICD-10-CM | POA: Diagnosis not present

## 2023-12-25 NOTE — Progress Notes (Signed)
 BP 121/83   Pulse 98   Temp 97.9 F (36.6 C) (Oral)   Ht 5' 5 (1.651 m)   Wt 161 lb 9.6 oz (73.3 kg)   LMP  (LMP Unknown) Comment: age 73  SpO2 96%   BMI 26.89 kg/m    Subjective:    Patient ID: Cheryl Mendez, female    DOB: 1950-09-04, 73 y.o.   MRN: [id number]  HPI: Cheryl Mendez is a 73 y.o. female  Chief Complaint  Patient presents with   Abdominal Pain    Patient states she has just been feeling off for the last week and a half. States she has been having constant abdominal pains since then. States she has not thrown up but feels like she could. States she has not had any changes in her bowel movements. States she feels like she has eaten something bad but states she knows she hasn't.    ABDOMINAL ISSUES Feels like she has a fever in her body but when she takes her temperature it is normal.  Feels jittery and weak.  She does eat but feels a decrease in appetite. Has been taking her Omeprazole  but not improved.  She is having bowel movements every other day. Duration: 1.5 weeks Nature: nausea and wants to throw up but she doesn't Location: epigastric  Severity: moderate  Radiation: no Episode duration: Frequency: constant Alleviating factors: Pepto (helped for about 20 minutes) Aggravating factors: nothing Treatments attempted: Pepto Constipation: no Diarrhea: no Episodes of diarrhea/day: Mucous in the stool: no Heartburn: no Bloating:yes Flatulence: yes Nausea: yes Vomiting: no Episodes of vomit/day: Melena or hematochezia: no Rash: no Jaundice: no Fever: no Weight loss: no   Relevant past medical, surgical, family and social history reviewed and updated as indicated. Interim medical history since our last visit reviewed. Allergies and medications reviewed and updated.  Review of Systems  Constitutional:  Positive for appetite change. Negative for fever and unexpected weight change.  Gastrointestinal:  Positive for abdominal distention, abdominal  pain and nausea. Negative for constipation, diarrhea and vomiting.    Per HPI unless specifically indicated above     Objective:    BP 121/83   Pulse 98   Temp 97.9 F (36.6 C) (Oral)   Ht 5' 5 (1.651 m)   Wt 161 lb 9.6 oz (73.3 kg)   LMP  (LMP Unknown) Comment: age 74  SpO2 96%   BMI 26.89 kg/m   Wt Readings from Last 3 Encounters:  12/25/23 161 lb 9.6 oz (73.3 kg)  12/08/23 166 lb (75.3 kg)  12/02/23 162 lb (73.5 kg)    Physical Exam Vitals and nursing note reviewed.  Constitutional:      General: She is not in acute distress.    Appearance: Normal appearance. She is normal weight. She is not ill-appearing, toxic-appearing or diaphoretic.  HENT:     Head: Normocephalic.     Right Ear: External ear normal.     Left Ear: External ear normal.     Nose: Nose normal.     Mouth/Throat:     Mouth: Mucous membranes are moist.     Pharynx: Oropharynx is clear.  Eyes:     General:        Right eye: No discharge.        Left eye: No discharge.     Extraocular Movements: Extraocular movements intact.     Conjunctiva/sclera: Conjunctivae normal.     Pupils: Pupils are equal, round, and reactive to light.  Cardiovascular:     Rate and Rhythm: Normal rate and regular rhythm.     Heart sounds: No murmur heard. Pulmonary:     Effort: Pulmonary effort is normal. No respiratory distress.     Breath sounds: Normal breath sounds. No wheezing or rales.  Abdominal:     General: Abdomen is flat. Bowel sounds are normal.     Palpations: Abdomen is soft.     Tenderness: There is abdominal tenderness in the right lower quadrant and left lower quadrant. There is guarding. There is no right CVA tenderness, left CVA tenderness or rebound. Negative signs include Murphy's sign, Rovsing's sign, McBurney's sign, psoas sign and obturator sign.  Musculoskeletal:     Cervical back: Normal range of motion and neck supple.  Skin:    General: Skin is warm and dry.     Capillary Refill: Capillary  refill takes less than 2 seconds.  Neurological:     General: No focal deficit present.     Mental Status: She is alert and oriented to person, place, and time. Mental status is at baseline.  Psychiatric:        Mood and Affect: Mood normal.        Behavior: Behavior normal.        Thought Content: Thought content normal.        Judgment: Judgment normal.     Results for orders placed or performed in visit on 12/08/23  Bayer DCA Hb A1c Waived   Collection Time: 12/08/23  8:55 AM  Result Value Ref Range   HB A1C (BAYER DCA - WAIVED) 7.6 (H) 4.8 - 5.6 %  Comprehensive metabolic panel with GFR   Collection Time: 12/08/23  8:55 AM  Result Value Ref Range   Glucose 161 (H) 70 - 99 mg/dL   BUN 14 8 - 27 mg/dL   Creatinine, Ser 9.34 0.57 - 1.00 mg/dL   eGFR 93 >40 fO/fpw/8.26   BUN/Creatinine Ratio 22 12 - 28   Sodium 141 134 - 144 mmol/L   Potassium 4.1 3.5 - 5.2 mmol/L   Chloride 99 96 - 106 mmol/L   CO2 25 20 - 29 mmol/L   Calcium  9.9 8.7 - 10.3 mg/dL   Total Protein 6.9 6.0 - 8.5 g/dL   Albumin 4.3 3.8 - 4.8 g/dL   Globulin, Total 2.6 1.5 - 4.5 g/dL   Bilirubin Total 0.5 0.0 - 1.2 mg/dL   Alkaline Phosphatase 51 49 - 135 IU/L   AST 18 0 - 40 IU/L   ALT 20 0 - 32 IU/L  Lipid Panel w/o Chol/HDL Ratio   Collection Time: 12/08/23  8:55 AM  Result Value Ref Range   Cholesterol, Total 168 100 - 199 mg/dL   Triglycerides 820 (H) 0 - 149 mg/dL   HDL 42 >60 mg/dL   VLDL Cholesterol Cal 31 5 - 40 mg/dL   LDL Chol Calc (NIH) 95 0 - 99 mg/dL      Assessment & Plan:   Problem List Items Addressed This Visit       Other   LLQ abdominal pain - Primary   Ongoing x 1.5 weeks. Has lost 5lbs since 10/20.  Little appetite, LLQ/RLQ pain on exam- worse on the right side.  Increased nausea and pain with palpation. Continue with a bland diet. Will order CT to rule out diverticulitis. COVID test done a patient's request. Hold Rybelsus  at this time. Will make recommendations based on  results.  Relevant Orders   CT ABDOMEN PELVIS W CONTRAST   Other Visit Diagnoses       Acute cough       Relevant Orders   Novel Coronavirus, NAA ([facility])        Follow up plan: No follow-ups on file.    A total of 30 minutes were spent on this encounter today.  When total time is documented, this includes both the face-to-face and non-face-to-face time personally spent before, during and after the visit on the date of the encounter plan of care, medications, symptoms and follow up.

## 2023-12-25 NOTE — Assessment & Plan Note (Signed)
 Ongoing x 1.5 weeks. Has lost 5lbs since 10/20.  Little appetite, LLQ/RLQ pain on exam- worse on the right side.  Increased nausea and pain with palpation. Continue with a bland diet. Will order CT to rule out diverticulitis. COVID test done a patient's request. Hold Rybelsus  at this time. Will make recommendations based on results.

## 2023-12-27 LAB — NOVEL CORONAVIRUS, NAA: SARS-CoV-2, NAA: NOT DETECTED

## 2023-12-29 ENCOUNTER — Ambulatory Visit: Payer: Self-pay | Admitting: Nurse Practitioner

## 2024-01-07 ENCOUNTER — Ambulatory Visit
Admission: RE | Admit: 2024-01-07 | Discharge: 2024-01-07 | Disposition: A | Discharge status: Home / Self Care | Source: Ambulatory Visit | Attending: Nurse Practitioner | Admitting: Nurse Practitioner

## 2024-01-07 DIAGNOSIS — R1032 Left lower quadrant pain: Secondary | ICD-10-CM | POA: Insufficient documentation

## 2024-01-07 MED ORDER — IOHEXOL 300 MG/ML  SOLN
100.0000 mL | Freq: Once | INTRAMUSCULAR | Status: AC | PRN
  Administered 2024-01-07: 100 mL via INTRAVENOUS

## 2024-01-12 ENCOUNTER — Ambulatory Visit: Payer: Self-pay

## 2024-01-12 ENCOUNTER — Telehealth: Payer: Self-pay | Admitting: Nurse Practitioner

## 2024-01-12 NOTE — Telephone Encounter (Signed)
 Patient does not want to go to another office to be seen.

## 2024-01-12 NOTE — Telephone Encounter (Signed)
 FYI Only or Action Required?: Action required by provider: medication refill request. Pt states that she would like a medication sent in for her sore throat, pt states that she has been seen by 2 providers at [facility] for this. Pt states that she will come in if she absolutely has too, no appts available at time of call until mid Jan.   Patient was last seen in primary care on 12/25/2023 by Melvin Pao, NP.  Called Nurse Triage reporting Sore Throat.  Symptoms began 1.5 weeks ago.  Interventions attempted: Rest, hydration, or home remedies.  Symptoms are: unchanged.  Triage Disposition: See Physician Within 24 Hours  Patient/caregiver understands and will follow disposition?: no would like medication sent in   Reason for Triage: sore throat has not gotten better  Reason for Disposition  SEVERE throat pain (e.g., excruciating)  Answer Assessment - Initial Assessment Questions 1. ONSET: When did the throat start hurting? (Hours or days ago)      1.5 weeks 2. SEVERITY: How bad is the sore throat? (Scale 1-10; mild, moderate or severe)     Close to severe per pt 3. STREP EXPOSURE: Has there been any exposure to strep within the past week? If Yes, ask: What type of contact occurred?      Unsure, was at the hospital for a test and was sick the next day 4.  VIRAL SYMPTOMS: Are there any symptoms of a cold, such as a runny nose, cough, hoarse voice or red eyes?      Cough-sputum is brown, runny nose 5. FEVER: Do you have a fever? If Yes, ask: What is your temperature, how was it measured, and when did it start?     denies 6. PUS ON THE TONSILS: Is there pus on the tonsils in the back of your throat?     unsure 7. OTHER SYMPTOMS: Do you have any other symptoms? (e.g., difficulty breathing, headache, rash)     Denies at this time  Pt has been doing numerous home remedies with no relief. States she would like a medication called in, as she was seen for this in the past by  two providers at [facility].  Protocols used: Sore Throat-A-AH

## 2024-01-12 NOTE — Telephone Encounter (Signed)
 Called patient to let her know that we can see her at [phone] tomorrow.

## 2024-01-12 NOTE — Telephone Encounter (Signed)
 Scheduled

## 2024-01-13 ENCOUNTER — Ambulatory Visit: Admitting: Nurse Practitioner

## 2024-01-13 ENCOUNTER — Encounter: Payer: Self-pay | Admitting: Nurse Practitioner

## 2024-01-13 VITALS — BP 118/76 | HR 86 | Temp 98.4°F | Resp 15 | Ht 65.0 in | Wt 161.0 lb

## 2024-01-13 DIAGNOSIS — K219 Gastro-esophageal reflux disease without esophagitis: Secondary | ICD-10-CM | POA: Diagnosis not present

## 2024-01-13 DIAGNOSIS — E1165 Type 2 diabetes mellitus with hyperglycemia: Secondary | ICD-10-CM | POA: Diagnosis not present

## 2024-01-13 DIAGNOSIS — J320 Chronic maxillary sinusitis: Secondary | ICD-10-CM | POA: Insufficient documentation

## 2024-01-13 DIAGNOSIS — J0100 Acute maxillary sinusitis, unspecified: Secondary | ICD-10-CM

## 2024-01-13 DIAGNOSIS — Z7984 Long term (current) use of oral hypoglycemic drugs: Secondary | ICD-10-CM

## 2024-01-13 MED ORDER — DOXYCYCLINE HYCLATE 100 MG PO TABS: 100.0000 mg | ORAL_TABLET | Freq: Two times a day (BID) | ORAL | 0 refills | Status: AC

## 2024-01-13 MED ORDER — LIDOCAINE VISCOUS HCL 2 % MT SOLN: 15.0000 mL | OROMUCOSAL | 0 refills | Status: DC | PRN

## 2024-01-13 NOTE — Assessment & Plan Note (Signed)
 Acute with symptoms for one week and not improving. Start Doxycycline  100 MG BID, to take with a probiotic yogurt to help with GI symptoms. Viscous lidocaine  sent for ongoing mild sore throat, this is improving but not 100%. Recommend: - Increased rest - Increasing Fluids - Acetaminophen / ibuprofen as needed for fever/pain.  - Salt water gargling, chloraseptic spray and throat lozenges - Mucinex .  - Humidifying the air.

## 2024-01-13 NOTE — Patient Instructions (Signed)
 Heartburn Heartburn is when you feel pain or discomfort in your throat or chest. It may feel like a burning pain. It can also cause a bad, acid-like taste in your mouth. It may feel worse: When you lie down. When you bend over. At night. Heartburn may be caused by the contents in your stomach moving back up into your esophagus. Your esophagus is the part of your body that moves food from your mouth to your stomach. Follow these instructions at home: Eating and drinking Follow an eating plan as told by your health care provider. Avoid certain foods and drinks as told. These may include: Coffee and tea, with or without caffeine. Alcohol. Energy drinks and sports drinks. Fizzy drinks or sodas. Chocolate and cocoa. Peppermint and mint flavorings. Garlic and onions. Horseradish. Spicy and acidic foods. These include: Peppers. Chili powder and curry powder. Vinegar. Hot sauces and BBQ sauce. Citrus fruits and juices. These include: Oranges. Lemons. Limes. Tomato-based foods. These include: Red sauce and pizza with red sauce. Chili. Salsa. Fried and fatty foods. These include: Donuts. [place] fries. Potato chips. High-fat dressings. High-fat meats. These include: Hot dogs and sausage. Rib eye steak. Ham and bacon. High-fat dairy items. These include: Whole milk. Butter. Cream cheese. Eat small meals often. Avoid eating big meals. Avoid drinking lots of liquid with your meals. Try not to eat meals during the 2-3 hours before bedtime. Try not to lie down right after you eat. Do not exercise right after you eat. Lifestyle  If you're overweight, lose an amount of weight that's healthy for you. Ask your provider about a safe weight loss goal. Do not smoke, vape, or use nicotine or tobacco. These can make your symptoms worse. If you need help quitting, talk with your provider. Wear loose clothes. Do not wear things that are tight around your waist. When you sleep,  try: Raising the head of your bed about 6 inches (15 cm). You can use a wedge to do this. Lying down on your left side. Try to lower your stress. If you need help doing this, ask your provider. Medicines Take your medicines only as told by your provider. Do not take aspirin or ibuprofen unless you're told to. Stop medicines only as told by your provider. If you stop taking some medicines too quickly, your symptoms may get worse. General instructions Watch for any changes in your symptoms. Contact a health care provider if: You have new symptoms. You have weight loss for no known reason. You have trouble swallowing. It hurts to swallow. You have wheezing. This is when you make high-pitched whistling sounds when you breathe, most often when you breathe out. You have a cough that won't go away. Your symptoms don't get better with treatment. You have heartburn often for more than 2 weeks. Get help right away if: You have pain all of a sudden in your: Arms. Neck. Jaw. Teeth. Back. You feel sweaty, dizzy, or light-headed all of a sudden. You have chest pain or shortness of breath. You vomit and your vomit looks like blood or coffee grounds. Your poop is bloody or black. These symptoms may be an emergency. Call 911 right away. Do not wait to see if the symptoms will go away. Do not drive yourself to the hospital. This information is not intended to replace advice given to you by your health care provider. Make sure you discuss any questions you have with your health care provider. Document Revised: 12/17/2022 Document Reviewed: 06/28/2022 Elsevier Patient Education  2024 [employer]

## 2024-01-13 NOTE — Assessment & Plan Note (Signed)
 Chronic, ongoing.  A1c 7.6% in October, trend up from 7.4%. Urine ALB 20 March 2023 - on ACE. Continue Lotensin  for kidney protection.  - Recommend Cheryl Mendez take Metformin  1000 MG BID, but will maintain off Rybelsus  as this is second trial of it and has increased GERD and abdominal pain with this. Did not tolerate SGLT2, Glipizide , or GLP1 injections in past. Continue diet changes at home. Recommend to monitor BS daily at home and document daily with fasting BS goal of <130.  Monitor diet closely with focus on diabetic diet and regular exercise.  - Continue Lyrica  for neuropathy which Cheryl Mendez is finding benefit from - Eye and Foot exam up to date - On ACE and statin therapy - Vaccinations up to date - Suspect if A1c elevated next visit we will need to initiate insulin therapy.

## 2024-01-13 NOTE — Assessment & Plan Note (Signed)
 Chronic, ongoing.  Continue Prilosec 40 MG daily at this time as is offering benefit since she restarted it one week ago.  Risks of PPI use were discussed with patient including bone loss, C. Diff diarrhea, pneumonia, infections, CKD, electrolyte abnormalities.  Pt. Verbalizes understanding and chooses to continue the medication. Mag level up to date.

## 2024-01-13 NOTE — Progress Notes (Signed)
 BP 118/76 (BP Location: Left Arm, Patient Position: Sitting, Cuff Size: Normal)   Pulse 86   Temp 98.4 F (36.9 C) (Oral)   Resp 15   Ht 5' 5 (1.651 m)   Wt 161 lb (73 kg)   LMP  (LMP Unknown) Comment: age 73  SpO2 98%   BMI 26.79 kg/m    Subjective:    Patient ID: Cheryl Mendez, female    DOB: Sep 30, 1950, 73 y.o.   MRN: [id number]  HPI: Cheryl Mendez is a 73 y.o. female  Chief Complaint  Patient presents with   Sore Throat    Ongoing for 2 weeks as describes as from the throat down her esophagus. Full head and ears, possible post nasal drip, sneezing, runny nose.      DYSPHAGIA For 2 weeks has had a burning feeling down esophagus. Had imaging which was reassuring recently. She stopped her Rybelsus , has history of similar symptoms in past. Still having pain in abdominal area. When takes Rybelsus  has to take Omeprazole  more often, but she had slacked off on this.  Feels her sugars are probably up due to medication changes. She reports the stinging and burning to back of tongue is improving and not closing inwards like it was. Last A1c October was 7.6%.  Reports having a full head/ears, post nasal drip, sneezing, and runny nose. These symptoms started after going to hospital recently for imaging. Using inhaler, Flonase , and Claritin . It is sinuses that are bothering her more now. Duration: weeks Description of symptom: Onset: Immediately upon swallowing Location of dysphagia: throat Dysphagia to solids only: yes Dysphagia to solids & liquids: no  Frequency:intermittent  Progressively getting worse: improving with stopping Rybelsus  Alleviatiating factors: starting back her Omeprazole  and stopping Rybelsus  Provoking factors: unknown Status: better EGD: no Weight loss: no Sensation of lump in throat: no Heartburn: raw feeling and improving Odynophagia: no Nausea: at first Vomiting: no Drooling/nasal regurgitation/food spillage: no Coughing/choking/dysphonia:  no Dysarthria: no Hematemesis: no Regurgitation of undigested food/halitosis: no Chest pain: no   Relevant past medical, surgical, family and social history reviewed and updated as indicated. Interim medical history since our last visit reviewed. Allergies and medications reviewed and updated.  Review of Systems  Constitutional:  Positive for appetite change and fatigue. Negative for activity change, diaphoresis and fever.  HENT:  Positive for congestion, postnasal drip, rhinorrhea, sinus pressure, sore throat and trouble swallowing (improving). Negative for ear discharge, ear pain, sinus pain, sneezing and tinnitus.   Respiratory:  Positive for cough. Negative for chest tightness, shortness of breath and wheezing.   Cardiovascular:  Negative for chest pain, palpitations and leg swelling.  Gastrointestinal: Negative.   Neurological: Negative.   Psychiatric/Behavioral: Negative.      Per HPI unless specifically indicated above     Objective:    BP 118/76 (BP Location: Left Arm, Patient Position: Sitting, Cuff Size: Normal)   Pulse 86   Temp 98.4 F (36.9 C) (Oral)   Resp 15   Ht 5' 5 (1.651 m)   Wt 161 lb (73 kg)   LMP  (LMP Unknown) Comment: age 44  SpO2 98%   BMI 26.79 kg/m   Wt Readings from Last 3 Encounters:  01/13/24 161 lb (73 kg)  12/25/23 161 lb 9.6 oz (73.3 kg)  12/08/23 166 lb (75.3 kg)    Physical Exam Vitals and nursing note reviewed.  Constitutional:      General: She is awake. She is not in acute distress.  Appearance: She is well-developed and well-groomed. She is obese. She is ill-appearing. She is not toxic-appearing.  HENT:     Head: Normocephalic.     Right Ear: Hearing, ear canal and external ear normal. A middle ear effusion is present. There is no impacted cerumen. Tympanic membrane is not injected or perforated.     Left Ear: Hearing, ear canal and external ear normal. A middle ear effusion is present. There is no impacted cerumen. Tympanic  membrane is not injected or perforated.     Nose: Rhinorrhea present. Rhinorrhea is clear.     Right Sinus: Maxillary sinus tenderness present. No frontal sinus tenderness.     Left Sinus: Maxillary sinus tenderness present. No frontal sinus tenderness.     Mouth/Throat:     Mouth: Mucous membranes are moist.     Pharynx: Posterior oropharyngeal erythema (mild) present. No pharyngeal swelling or oropharyngeal exudate.  Eyes:     General: Lids are normal.        Right eye: No discharge.        Left eye: No discharge.     Conjunctiva/sclera: Conjunctivae normal.     Pupils: Pupils are equal, round, and reactive to light.  Neck:     Thyroid : No thyromegaly.     Vascular: No carotid bruit.  Cardiovascular:     Rate and Rhythm: Normal rate and regular rhythm.     Heart sounds: Normal heart sounds. No murmur heard.    No gallop.  Pulmonary:     Effort: Pulmonary effort is normal. No accessory muscle usage or respiratory distress.     Breath sounds: Normal breath sounds. No decreased breath sounds, wheezing or rales.  Abdominal:     General: Abdomen is flat. Bowel sounds are normal. There is no distension.     Palpations: Abdomen is soft. There is no hepatomegaly or splenomegaly.     Tenderness: There is no abdominal tenderness.  Musculoskeletal:     Cervical back: Normal range of motion and neck supple.     Right lower leg: No edema.     Left lower leg: No edema.  Lymphadenopathy:     Head:     Right side of head: No submental, submandibular, tonsillar, preauricular or posterior auricular adenopathy.     Left side of head: No submental, submandibular, tonsillar, preauricular or posterior auricular adenopathy.     Cervical: No cervical adenopathy.  Skin:    General: Skin is warm and dry.  Neurological:     Mental Status: She is alert and oriented to person, place, and time.  Psychiatric:        Attention and Perception: Attention normal.        Mood and Affect: Mood normal.         Speech: Speech normal.        Behavior: Behavior normal. Behavior is cooperative.        Thought Content: Thought content normal.     Results for orders placed or performed in visit on 12/25/23  Novel Coronavirus, NAA ([facility])   Collection Time: 12/25/23  4:59 PM   Specimen: Nasopharyngeal(NP) swabs in vial transport medium  Result Value Ref Range   SARS-CoV-2, NAA Not Detected Not Detected      Assessment & Plan:   Problem List Items Addressed This Visit       Respiratory   Maxillary sinusitis   Acute with symptoms for one week and not improving. Start Doxycycline  100 MG BID, to take with a  probiotic yogurt to help with GI symptoms. Viscous lidocaine  sent for ongoing mild sore throat, this is improving but not 100%. Recommend: - Increased rest - Increasing Fluids - Acetaminophen / ibuprofen as needed for fever/pain.  - Salt water gargling, chloraseptic spray and throat lozenges - Mucinex .  - Humidifying the air.       Relevant Medications   doxycycline  (VIBRA -TABS) 100 MG tablet     Digestive   GERD (gastroesophageal reflux disease)   Chronic, ongoing.  Continue Prilosec 40 MG daily at this time as is offering benefit since she restarted it one week ago.  Risks of PPI use were discussed with patient including bone loss, C. Diff diarrhea, pneumonia, infections, CKD, electrolyte abnormalities.  Pt. Verbalizes understanding and chooses to continue the medication. Mag level up to date.         Endocrine   Poorly controlled type 2 diabetes mellitus (HCC) - Primary   Chronic, ongoing.  A1c 7.6% in October, trend up from 7.4%. Urine ALB 20 March 2023 - on ACE. Continue Lotensin  for kidney protection.  - Recommend she take Metformin  1000 MG BID, but will maintain off Rybelsus  as this is second trial of it and has increased GERD and abdominal pain with this. Did not tolerate SGLT2, Glipizide , or GLP1 injections in past. Continue diet changes at home. Recommend to monitor BS  daily at home and document daily with fasting BS goal of <130.  Monitor diet closely with focus on diabetic diet and regular exercise.  - Continue Lyrica  for neuropathy which she is finding benefit from - Eye and Foot exam up to date - On ACE and statin therapy - Vaccinations up to date - Suspect if A1c elevated next visit we will need to initiate insulin therapy.        Follow up plan: Return in about 8 weeks (around 03/10/2024) for T2DM, HTN/HLD.

## 2024-01-20 ENCOUNTER — Telehealth: Payer: Self-pay | Admitting: Nurse Practitioner

## 2024-01-20 NOTE — Telephone Encounter (Unsigned)
 Copied from CRM #[id number]. Topic: Clinical - Medication Question >> Jan 20, 2024  9:23 AM Charlet HERO wrote: Reason for CRM: caitlyn vicci Junk [id number] is callinga bout the lidacane solution was sent in and it does not state how often.  Please call back with times to be applied or taken.

## 2024-01-21 ENCOUNTER — Ambulatory Visit: Admitting: Nurse Practitioner

## 2024-01-21 MED ORDER — LIDOCAINE VISCOUS HCL 2 % MT SOLN: 15.0000 mL | Freq: Four times a day (QID) | OROMUCOSAL | 0 refills | Status: DC | PRN

## 2024-01-21 NOTE — Telephone Encounter (Signed)
 Routing to provider to correct prescription and resend. Pharmacy needs the daily dose added to the script.

## 2024-02-18 ENCOUNTER — Telehealth: Payer: Self-pay

## 2024-02-18 NOTE — Telephone Encounter (Signed)
 Copied from CRM [id number]. Topic: Referral - Request for Referral >> Feb 18, 2024 11:51 AM Charlet HERO wrote: Did the patient discuss referral with their provider in the last year? Yes   Appointment offered? No  Type of order/referral and detailed reason for visit: MRI for right shoulder  Preference of office, provider, location: [facility]  If referral order, have you been seen by this specialty before? Yes Ultra sound for stomach  Can we respond through MyChart? No

## 2024-02-23 NOTE — Telephone Encounter (Signed)
 Spoke with patient she is aware that [facility] is responsible for managing her shoulder treatment. Through a lot of back and forth she now knows to reach out to them to proceed with scheduling.

## 2024-02-27 ENCOUNTER — Other Ambulatory Visit: Payer: Self-pay | Admitting: Nurse Practitioner

## 2024-03-07 NOTE — Patient Instructions (Incomplete)

## 2024-03-11 ENCOUNTER — Ambulatory Visit (INDEPENDENT_AMBULATORY_CARE_PROVIDER_SITE_OTHER): Admitting: Nurse Practitioner

## 2024-03-11 ENCOUNTER — Encounter: Payer: Self-pay | Admitting: Nurse Practitioner

## 2024-03-11 VITALS — BP 131/74 | HR 82 | Temp 98.0°F | Ht 65.0 in | Wt 174.6 lb

## 2024-03-11 DIAGNOSIS — E1140 Type 2 diabetes mellitus with diabetic neuropathy, unspecified: Secondary | ICD-10-CM | POA: Diagnosis not present

## 2024-03-11 DIAGNOSIS — E785 Hyperlipidemia, unspecified: Secondary | ICD-10-CM

## 2024-03-11 DIAGNOSIS — I152 Hypertension secondary to endocrine disorders: Secondary | ICD-10-CM

## 2024-03-11 DIAGNOSIS — E1165 Type 2 diabetes mellitus with hyperglycemia: Secondary | ICD-10-CM | POA: Diagnosis not present

## 2024-03-11 DIAGNOSIS — Z7984 Long term (current) use of oral hypoglycemic drugs: Secondary | ICD-10-CM | POA: Diagnosis not present

## 2024-03-11 DIAGNOSIS — E1169 Type 2 diabetes mellitus with other specified complication: Secondary | ICD-10-CM | POA: Diagnosis not present

## 2024-03-11 DIAGNOSIS — E1159 Type 2 diabetes mellitus with other circulatory complications: Secondary | ICD-10-CM

## 2024-03-11 DIAGNOSIS — E119 Type 2 diabetes mellitus without complications: Secondary | ICD-10-CM

## 2024-03-11 DIAGNOSIS — F5101 Primary insomnia: Secondary | ICD-10-CM

## 2024-03-11 LAB — MICROALBUMIN, URINE WAIVED
Creatinine, Urine Waived: 200 mg/dL (ref 10–300)
Microalb, Ur Waived: 30 mg/L — ABNORMAL HIGH (ref 0–19)
Microalb/Creat Ratio: <30 mg/g

## 2024-03-11 LAB — BAYER DCA HB A1C WAIVED: HB A1C (BAYER DCA - WAIVED): 8 % — ABNORMAL HIGH (ref 4.8–5.6)

## 2024-03-11 MED ORDER — LORATADINE 10 MG PO TABS: ORAL_TABLET | ORAL | 3 refills | Status: AC

## 2024-03-11 MED ORDER — ONETOUCH ULTRA 2 W/DEVICE KIT: PACK | 1 refills | Status: DC

## 2024-03-11 MED ORDER — BENAZEPRIL-HYDROCHLOROTHIAZIDE 20-25 MG PO TABS: 1.0000 | ORAL_TABLET | Freq: Every day | ORAL | 3 refills | Status: AC

## 2024-03-11 NOTE — Assessment & Plan Note (Signed)
 Chronic, stable.  BP at goal in office today. Continue current medication regimen and adjust as needed -- Lotensin  offering kidney protection.  Recommend checking BP at home three mornings a week and documenting for provider review.  LABS: CMP.  Urine ALB 19 March 2024.  Return in 3 months.

## 2024-03-11 NOTE — Assessment & Plan Note (Signed)
Refer to poorly controlled diabetes plan of care. 

## 2024-03-11 NOTE — Assessment & Plan Note (Signed)
 Chronic, ongoing.  A1c 8% today, trend up from 7.6%. Urine ALB 19 March 2024 - on ACE. Continue Lotensin  for kidney protection.  - Recommend she take Metformin  1000 MG BID. Maintain off Rybelsus  as this is second trial of it and has increased GERD and abdominal pain with this. Did not tolerate SGLT2, Glipizide , or GLP1 injections in past. Continue diet changes at home. Recommend to monitor BS daily at home and document daily with fasting BS goal of <130.  Monitor diet closely with focus on diabetic diet and regular exercise.  - Have highly recommended we start low dose of nightly long acting insulin, discussed at length with her today. She adamantly refuses this and reports wanting to work on diet for 3 months and walking. - Continue Lyrica  for neuropathy which she is finding benefit from - Eye and Foot exam up to date - On ACE and statin therapy - Vaccinations up to date - If A1c elevation next visit will need to initiate insulin therapy, which she agrees to.

## 2024-03-11 NOTE — Assessment & Plan Note (Signed)
 Chronic, ongoing.  Continue current medication regimen and adjust as needed. Lipid panel today.

## 2024-03-11 NOTE — Progress Notes (Signed)
 "  BP 131/74 (BP Location: Left Arm, Cuff Size: Normal)   Pulse 82   Temp 98 F (36.7 C) (Oral)   Ht 5' 5 (1.651 m)   Wt 174 lb 9.6 oz (79.2 kg)   LMP  (LMP Unknown) Comment: age 74  SpO2 98%   BMI 29.05 kg/m    Subjective:    Patient ID: Cheryl Mendez, female    DOB: [birth year], 74 y.o.   MRN: [id number]  HPI: Cheryl Mendez is a 74 y.o. female  Chief Complaint  Patient presents with   Diabetes   Hypertension   Hyperlipidemia   DIABETES Last A1c 7.6% October. Continues Metformin  1000 MG BID. Is taking B12 and Lyrica  for neuropathy discomfort, offers benefit. She wishes she could take weekly injections to help with weight, but these caused her GI issues in the past. Her belly has been feeling better since coming off Rybelsus . During the holidays the only thing she ate more of was ham.   History: Trulicity  with GI issues and Invokana  stopped due to foot concerns + Jardiance  made her sick. Glipizide  made her feel unwell and sugars too low. Rybelsus  caused GI issues even at lower doses. Hypoglycemic episodes:no Polydipsia/polyuria: no Visual disturbance: no Chest pain: no Paresthesias: no Glucose Monitoring: yes  Accucheck frequency: machine is not working well  Fasting glucose: 179 this morning  Post prandial:  Evening:  Before meals: Taking Insulin?: no  Long acting insulin:  Short acting insulin: Blood Pressure Monitoring: not checking Retinal Examination: Up to Date - Dr. Carolee New Exam: Up to Date Diabetic Education: Completed Pneumovax: Up to Date Influenza: Up to Date Aspirin: yes   HYPERTENSION / HYPERLIPIDEMIA Continues Lotensin  20-25 and Atorvastatin  10 MG. . Satisfied with current treatment? yes Duration of hypertension: chronic BP monitoring frequency: not checking BP range:  BP medication side effects: no Duration of hyperlipidemia: chronic Cholesterol medication side effects: no Cholesterol supplements: none Medication compliance: good  compliance Aspirin: yes Recent stressors: no Recurrent headaches: no Visual changes: no Palpitations: no Dyspnea: no Chest pain: no Lower extremity edema: a little bit recently Dizzy/lightheaded: no   Relevant past medical, surgical, family and social history reviewed and updated as indicated. Interim medical history since our last visit reviewed. Allergies and medications reviewed and updated.  Review of Systems  Constitutional:  Negative for activity change, appetite change, diaphoresis, fatigue and fever.  HENT: Negative.    Respiratory:  Negative for cough, chest tightness, shortness of breath and wheezing.   Cardiovascular:  Negative for chest pain, palpitations and leg swelling.  Gastrointestinal: Negative.   Endocrine: Negative for cold intolerance, heat intolerance, polydipsia, polyphagia and polyuria.  Neurological: Negative.   Psychiatric/Behavioral:  Negative for decreased concentration, self-injury, sleep disturbance and suicidal ideas. The patient is not nervous/anxious.    Per HPI unless specifically indicated above     Objective:    BP 131/74 (BP Location: Left Arm, Cuff Size: Normal)   Pulse 82   Temp 98 F (36.7 C) (Oral)   Ht 5' 5 (1.651 m)   Wt 174 lb 9.6 oz (79.2 kg)   LMP  (LMP Unknown) Comment: age 82  SpO2 98%   BMI 29.05 kg/m   Wt Readings from Last 3 Encounters:  03/11/24 174 lb 9.6 oz (79.2 kg)  01/13/24 161 lb (73 kg)  12/25/23 161 lb 9.6 oz (73.3 kg)    Physical Exam Vitals and nursing note reviewed.  Constitutional:      General: She is awake.  She is not in acute distress.    Appearance: She is well-developed, well-groomed and overweight. She is not ill-appearing or toxic-appearing.  HENT:     Head: Normocephalic.     Right Ear: Hearing, tympanic membrane, ear canal and external ear normal.     Left Ear: Hearing, tympanic membrane, ear canal and external ear normal.     Nose: Nose normal.     Right Sinus: No maxillary sinus  tenderness or frontal sinus tenderness.     Left Sinus: No maxillary sinus tenderness or frontal sinus tenderness.     Mouth/Throat:     Mouth: Mucous membranes are moist.     Pharynx: No pharyngeal swelling, oropharyngeal exudate or posterior oropharyngeal erythema.  Eyes:     General: Lids are normal.        Right eye: No discharge.        Left eye: No discharge.     Extraocular Movements: Extraocular movements intact.     Conjunctiva/sclera: Conjunctivae normal.     Pupils: Pupils are equal, round, and reactive to light.     Visual Fields: Right eye visual fields normal and left eye visual fields normal.  Neck:     Thyroid : No thyromegaly.     Vascular: No carotid bruit.  Cardiovascular:     Rate and Rhythm: Normal rate and regular rhythm.     Heart sounds: Normal heart sounds. No murmur heard.    No gallop.  Pulmonary:     Effort: Pulmonary effort is normal. No accessory muscle usage or respiratory distress.     Breath sounds: Normal breath sounds. No decreased breath sounds, wheezing or rales.  Abdominal:     General: Bowel sounds are normal. There is no distension.     Palpations: Abdomen is soft.     Tenderness: There is no abdominal tenderness.  Musculoskeletal:     Cervical back: Normal range of motion and neck supple.     Right lower leg: No edema.     Left lower leg: No edema.  Lymphadenopathy:     Cervical: No cervical adenopathy.  Skin:    General: Skin is warm and dry.  Neurological:     Mental Status: She is alert and oriented to person, place, and time.     Deep Tendon Reflexes: Reflexes are normal and symmetric.     Reflex Scores:      Brachioradialis reflexes are 2+ on the right side and 2+ on the left side.      Patellar reflexes are 2+ on the right side and 2+ on the left side. Psychiatric:        Attention and Perception: Attention normal.        Mood and Affect: Mood normal.        Speech: Speech normal.        Behavior: Behavior normal. Behavior is  cooperative.        Thought Content: Thought content normal.    Results for orders placed or performed in visit on 12/25/23  Novel Coronavirus, NAA ([facility])   Collection Time: 12/25/23  4:59 PM   Specimen: Nasopharyngeal(NP) swabs in vial transport medium  Result Value Ref Range   SARS-CoV-2, NAA Not Detected Not Detected      Assessment & Plan:   Problem List Items Addressed This Visit       Cardiovascular and Mediastinum   Hypertension associated with diabetes (HCC)   Chronic, stable.  BP at goal in office today. Continue current medication  regimen and adjust as needed -- Lotensin  offering kidney protection.  Recommend checking BP at home three mornings a week and documenting for provider review.  LABS: CMP.  Urine ALB 19 March 2024.  Return in 3 months.      Relevant Medications   benazepril -hydrochlorthiazide (LOTENSIN  HCT) 20-25 MG tablet   Other Relevant Orders   Bayer DCA Hb A1c Waived   Microalbumin, Urine Waived   Comprehensive metabolic panel with GFR     Endocrine   Type 2 diabetes mellitus with diabetic neuropathy, without long-term current use of insulin (HCC)   Chronic, ongoing.  A1c 8% today, trend up from 7.6%. Urine ALB 19 March 2024 - on ACE. Continue Lotensin  for kidney protection.  - Recommend she take Metformin  1000 MG BID. Maintain off Rybelsus  as this is second trial of it and has increased GERD and abdominal pain with this. Did not tolerate SGLT2, Glipizide , or GLP1 injections in past. Continue diet changes at home. Recommend to monitor BS daily at home and document daily with fasting BS goal of <130.  Monitor diet closely with focus on diabetic diet and regular exercise.  - Have highly recommended we start low dose of nightly long acting insulin, discussed at length with her today. She adamantly refuses this and reports wanting to work on diet for 3 months and walking. - Continue Lyrica  for neuropathy which she is finding benefit from - Eye and Foot  exam up to date - On ACE and statin therapy - Vaccinations up to date - If A1c elevation next visit will need to initiate insulin therapy, which she agrees to.      Relevant Medications   benazepril -hydrochlorthiazide (LOTENSIN  HCT) 20-25 MG tablet   Poorly controlled type 2 diabetes mellitus (HCC) - Primary   Chronic, ongoing.  A1c 8% today, trend up from 7.6%. Urine ALB 19 March 2024 - on ACE. Continue Lotensin  for kidney protection.  - Recommend she take Metformin  1000 MG BID. Maintain off Rybelsus  as this is second trial of it and has increased GERD and abdominal pain with this. Did not tolerate SGLT2, Glipizide , or GLP1 injections in past. Continue diet changes at home. Recommend to monitor BS daily at home and document daily with fasting BS goal of <130.  Monitor diet closely with focus on diabetic diet and regular exercise.  - Have highly recommended we start low dose of nightly long acting insulin, discussed at length with her today. She adamantly refuses this and reports wanting to work on diet for 3 months and walking. - Continue Lyrica  for neuropathy which she is finding benefit from - Eye and Foot exam up to date - On ACE and statin therapy - Vaccinations up to date - If A1c elevation next visit will need to initiate insulin therapy, which she agrees to.      Relevant Medications   benazepril -hydrochlorthiazide (LOTENSIN  HCT) 20-25 MG tablet   Other Relevant Orders   Bayer DCA Hb A1c Waived   Microalbumin, Urine Waived   Hyperlipidemia associated with type 2 diabetes mellitus (HCC)   Chronic, ongoing.  Continue current medication regimen and adjust as needed.  Lipid panel today.      Relevant Medications   benazepril -hydrochlorthiazide (LOTENSIN  HCT) 20-25 MG tablet   Other Relevant Orders   Bayer DCA Hb A1c Waived   Comprehensive metabolic panel with GFR   Lipid Panel w/o Chol/HDL Ratio   Diabetes mellitus treated with oral medication (HCC)   Refer to poorly  controlled diabetes plan  of care.      Relevant Medications   benazepril -hydrochlorthiazide (LOTENSIN  HCT) 20-25 MG tablet   Other Relevant Orders   Bayer DCA Hb A1c Waived   Microalbumin, Urine Waived     Follow up plan: Return in about 3 months (around 06/09/2024) for T2DM, HTN/HLD.       "

## 2024-03-12 ENCOUNTER — Ambulatory Visit: Payer: Self-pay | Admitting: Nurse Practitioner

## 2024-03-12 LAB — COMPREHENSIVE METABOLIC PANEL WITH GFR
ALT: 21 IU/L (ref 0–32)
AST: 19 IU/L (ref 0–40)
Albumin: 4.1 g/dL (ref 3.8–4.8)
Alkaline Phosphatase: 43 IU/L — ABNORMAL LOW (ref 49–135)
BUN/Creatinine Ratio: 29 — ABNORMAL HIGH (ref 12–28)
BUN: 20 mg/dL (ref 8–27)
Bilirubin Total: 0.5 mg/dL (ref 0.0–1.2)
CO2: 24 mmol/L (ref 20–29)
Calcium: 9.2 mg/dL (ref 8.7–10.3)
Chloride: 96 mmol/L (ref 96–106)
Creatinine, Ser: 0.7 mg/dL (ref 0.57–1.00)
Globulin, Total: 2.4 g/dL (ref 1.5–4.5)
Glucose: 170 mg/dL — ABNORMAL HIGH (ref 70–99)
Potassium: 3.9 mmol/L (ref 3.5–5.2)
Sodium: 141 mmol/L (ref 134–144)
Total Protein: 6.5 g/dL (ref 6.0–8.5)
eGFR: 91 mL/min/1.73

## 2024-03-12 LAB — LIPID PANEL W/O CHOL/HDL RATIO
Cholesterol, Total: 138 mg/dL (ref 100–199)
HDL: 40 mg/dL
LDL Chol Calc (NIH): 65 mg/dL (ref 0–99)
Triglycerides: 196 mg/dL — ABNORMAL HIGH (ref 0–149)
VLDL Cholesterol Cal: 33 mg/dL (ref 5–40)

## 2024-03-12 NOTE — Progress Notes (Signed)
 Contacted via MyChart  Good morning Cheryl Mendez, your labs have returned and overall are stable. No medication changes needed. As we discussed focus heavily on health diet and regular activity to bring sugars down with goal of not starting insulin. Any questions? Keep being stellar!!  Thank you for allowing me to participate in your care.  I appreciate you. Kindest regards, Marvelyn Bouchillon

## 2024-03-23 ENCOUNTER — Other Ambulatory Visit: Payer: Self-pay

## 2024-03-23 MED ORDER — ACCU-CHEK GUIDE W/DEVICE KIT: 1.0000 | PACK | Freq: Two times a day (BID) | 0 refills | Status: AC

## 2024-03-23 MED ORDER — ACCU-CHEK GUIDE TEST VI STRP: 1.0000 | ORAL_STRIP | Freq: Two times a day (BID) | 3 refills | Status: AC

## 2024-03-23 MED ORDER — ACCU-CHEK SOFTCLIX LANCETS MISC: 1.0000 | Freq: Two times a day (BID) | 3 refills | Status: AC

## 2024-03-23 NOTE — Telephone Encounter (Signed)
 Prescription t'd up for Accu-Chek supplies and meter.

## 2024-03-23 NOTE — Telephone Encounter (Signed)
 Copied from CRM [id number]. Topic: Clinical - Prescription Issue >> Mar 22, 2024  4:04 PM Myrick T wrote: Reason for CRM: patient called stated her insurance does not cover the One Touch they will only cover the Accu-Chek meter and supplies. Patient needs another script sent in that her insurance will cover.

## 2024-06-14 ENCOUNTER — Ambulatory Visit: Admitting: Nurse Practitioner

## 2024-12-07 ENCOUNTER — Ambulatory Visit
